# Patient Record
Sex: Male | Born: 1940 | Race: Black or African American | Hispanic: No | Marital: Married | Smoking: Former smoker
Health system: Southern US, Community
[De-identification: ages and names within clinical notes are randomized; demographics above are authoritative.]

## PROBLEM LIST (undated history)

## (undated) ENCOUNTER — Inpatient Hospital Stay: Payer: Self-pay | Admitting: Oncology

## (undated) DIAGNOSIS — E119 Type 2 diabetes mellitus without complications: Secondary | ICD-10-CM

## (undated) DIAGNOSIS — E860 Dehydration: Principal | ICD-10-CM

## (undated) DIAGNOSIS — J029 Acute pharyngitis, unspecified: Secondary | ICD-10-CM

## (undated) DIAGNOSIS — C9002 Multiple myeloma in relapse: Secondary | ICD-10-CM

## (undated) DIAGNOSIS — I10 Essential (primary) hypertension: Secondary | ICD-10-CM

## (undated) DIAGNOSIS — N342 Other urethritis: Secondary | ICD-10-CM

## (undated) DIAGNOSIS — R410 Disorientation, unspecified: Principal | ICD-10-CM

## (undated) DIAGNOSIS — R112 Nausea with vomiting, unspecified: Secondary | ICD-10-CM

## (undated) DIAGNOSIS — N400 Enlarged prostate without lower urinary tract symptoms: Secondary | ICD-10-CM

## (undated) DIAGNOSIS — E1140 Type 2 diabetes mellitus with diabetic neuropathy, unspecified: Secondary | ICD-10-CM

## (undated) DIAGNOSIS — N189 Chronic kidney disease, unspecified: Secondary | ICD-10-CM

## (undated) DIAGNOSIS — K219 Gastro-esophageal reflux disease without esophagitis: Secondary | ICD-10-CM

## (undated) DIAGNOSIS — M47816 Spondylosis without myelopathy or radiculopathy, lumbar region: Secondary | ICD-10-CM

## (undated) DIAGNOSIS — E782 Mixed hyperlipidemia: Secondary | ICD-10-CM

## (undated) DIAGNOSIS — I82409 Acute embolism and thrombosis of unspecified deep veins of unspecified lower extremity: Secondary | ICD-10-CM

## (undated) DIAGNOSIS — R300 Dysuria: Secondary | ICD-10-CM

## (undated) DIAGNOSIS — R509 Fever, unspecified: Secondary | ICD-10-CM

## (undated) DIAGNOSIS — B029 Zoster without complications: Secondary | ICD-10-CM

## (undated) DIAGNOSIS — M1990 Unspecified osteoarthritis, unspecified site: Secondary | ICD-10-CM

## (undated) DIAGNOSIS — D649 Anemia, unspecified: Secondary | ICD-10-CM

## (undated) DIAGNOSIS — IMO0002 Reserved for concepts with insufficient information to code with codable children: Secondary | ICD-10-CM

## (undated) HISTORY — DX: Zoster without complications: B02.9

## (undated) HISTORY — DX: Chronic kidney disease, unspecified: N18.9

## (undated) HISTORY — DX: Type 2 diabetes mellitus with diabetic neuropathy, unspecified: E11.40

## (undated) HISTORY — DX: Reserved for concepts with insufficient information to code with codable children: IMO0002

## (undated) HISTORY — DX: Nausea with vomiting, unspecified: R11.2

## (undated) HISTORY — PX: FOOT SURGERY: SHX648

## (undated) HISTORY — DX: Essential (primary) hypertension: I10

## (undated) HISTORY — DX: Dysuria: R30.0

## (undated) HISTORY — DX: Multiple myeloma in relapse: C90.02

## (undated) HISTORY — DX: Benign prostatic hyperplasia without lower urinary tract symptoms: N40.0

## (undated) HISTORY — DX: Acute pharyngitis, unspecified: J02.9

## (undated) HISTORY — DX: Disorientation, unspecified: R41.0

## (undated) HISTORY — DX: Anemia, unspecified: D64.9

## (undated) HISTORY — DX: Type 2 diabetes mellitus without complications: E11.9

## (undated) HISTORY — DX: Acute embolism and thrombosis of unspecified deep veins of unspecified lower extremity: I82.409

## (undated) HISTORY — DX: Spondylosis without myelopathy or radiculopathy, lumbar region: M47.816

## (undated) HISTORY — DX: Mixed hyperlipidemia: E78.2

## (undated) HISTORY — DX: Fever, unspecified: R50.9

## (undated) HISTORY — DX: Unspecified osteoarthritis, unspecified site: M19.90

## (undated) HISTORY — DX: Dehydration: E86.0

## (undated) HISTORY — DX: Other urethritis: N34.2

## (undated) HISTORY — DX: Gastro-esophageal reflux disease without esophagitis: K21.9

---

## 2009-04-10 ENCOUNTER — Encounter: Admission: RE | Admit: 2009-04-10 | Discharge: 2009-04-10 | Payer: Self-pay | Admitting: Gastroenterology

## 2009-06-02 ENCOUNTER — Emergency Department (HOSPITAL_COMMUNITY): Admission: EM | Admit: 2009-06-02 | Discharge: 2009-06-02 | Payer: Self-pay | Admitting: Emergency Medicine

## 2009-06-04 ENCOUNTER — Inpatient Hospital Stay (HOSPITAL_COMMUNITY): Admission: EM | Admit: 2009-06-04 | Discharge: 2009-06-10 | Payer: Self-pay | Admitting: Emergency Medicine

## 2009-06-04 ENCOUNTER — Ambulatory Visit: Payer: Self-pay | Admitting: Oncology

## 2009-06-07 ENCOUNTER — Encounter (INDEPENDENT_AMBULATORY_CARE_PROVIDER_SITE_OTHER): Payer: Self-pay | Admitting: Interventional Radiology

## 2009-06-11 ENCOUNTER — Ambulatory Visit: Payer: Self-pay | Admitting: Oncology

## 2009-06-11 ENCOUNTER — Emergency Department (HOSPITAL_COMMUNITY): Admission: EM | Admit: 2009-06-11 | Discharge: 2009-06-11 | Payer: Self-pay | Admitting: Emergency Medicine

## 2009-06-13 ENCOUNTER — Ambulatory Visit: Payer: Self-pay | Admitting: Oncology

## 2009-06-13 LAB — CBC WITH DIFFERENTIAL (CANCER CENTER ONLY)
BASO#: 0 10*3/uL (ref 0.0–0.2)
EOS%: 1.8 % (ref 0.0–7.0)
HCT: 25.6 % — ABNORMAL LOW (ref 38.7–49.9)
HGB: 9 g/dL — ABNORMAL LOW (ref 13.0–17.1)
LYMPH%: 26.7 % (ref 14.0–48.0)
MCH: 31.4 pg (ref 28.0–33.4)
MCHC: 35 g/dL (ref 32.0–35.9)
MONO%: 4.9 % (ref 0.0–13.0)
NEUT%: 66.2 % (ref 40.0–80.0)
RDW: 15.2 % — ABNORMAL HIGH (ref 10.5–14.6)

## 2009-06-13 LAB — CMP (CANCER CENTER ONLY)
ALT(SGPT): 25 U/L (ref 10–47)
AST: 21 U/L (ref 11–38)
Albumin: 3 g/dL — ABNORMAL LOW (ref 3.3–5.5)
BUN, Bld: 49 mg/dL — ABNORMAL HIGH (ref 7–22)
CO2: 29 mEq/L (ref 18–33)
Calcium: 10.5 mg/dL — ABNORMAL HIGH (ref 8.0–10.3)
Chloride: 108 mEq/L (ref 98–108)
Potassium: 4.8 mEq/L — ABNORMAL HIGH (ref 3.3–4.7)

## 2009-06-15 LAB — LACTATE DEHYDROGENASE: LDH: 170 U/L (ref 94–250)

## 2009-06-15 LAB — KAPPA/LAMBDA LIGHT CHAINS: Lambda Free Lght Chn: 3550 mg/dL — ABNORMAL HIGH (ref 0.57–2.63)

## 2009-06-26 LAB — CBC WITH DIFFERENTIAL/PLATELET
BASO%: 0 % (ref 0.0–2.0)
Basophils Absolute: 0 10*3/uL (ref 0.0–0.1)
EOS%: 0 % (ref 0.0–7.0)
HCT: 26 % — ABNORMAL LOW (ref 38.4–49.9)
HGB: 8.8 g/dL — ABNORMAL LOW (ref 13.0–17.1)
LYMPH%: 12.5 % — ABNORMAL LOW (ref 14.0–49.0)
MCH: 30.6 pg (ref 27.2–33.4)
MCHC: 33.8 g/dL (ref 32.0–36.0)
MCV: 90.3 fL (ref 79.3–98.0)
NEUT%: 80.2 % — ABNORMAL HIGH (ref 39.0–75.0)
Platelets: 141 10*3/uL (ref 140–400)

## 2009-06-29 LAB — BASIC METABOLIC PANEL
BUN: 42 mg/dL — ABNORMAL HIGH (ref 6–23)
Calcium: 8.4 mg/dL (ref 8.4–10.5)
Chloride: 97 mEq/L (ref 96–112)
Creatinine, Ser: 1.61 mg/dL — ABNORMAL HIGH (ref 0.40–1.50)

## 2009-07-03 LAB — CBC WITH DIFFERENTIAL/PLATELET
BASO%: 0.2 % (ref 0.0–2.0)
Basophils Absolute: 0 10*3/uL (ref 0.0–0.1)
EOS%: 0.2 % (ref 0.0–7.0)
HGB: 8.7 g/dL — ABNORMAL LOW (ref 13.0–17.1)
MCH: 30.9 pg (ref 27.2–33.4)
MCHC: 34.1 g/dL (ref 32.0–36.0)
RBC: 2.82 10*6/uL — ABNORMAL LOW (ref 4.20–5.82)
RDW: 16.4 % — ABNORMAL HIGH (ref 11.0–14.6)
lymph#: 0.6 10*3/uL — ABNORMAL LOW (ref 0.9–3.3)
nRBC: 1 % — ABNORMAL HIGH (ref 0–0)

## 2009-07-03 LAB — BASIC METABOLIC PANEL
Potassium: 4.1 mEq/L (ref 3.5–5.3)
Sodium: 136 mEq/L (ref 135–145)

## 2009-07-06 LAB — COMPREHENSIVE METABOLIC PANEL
CO2: 24 mEq/L (ref 19–32)
Calcium: 6.7 mg/dL — ABNORMAL LOW (ref 8.4–10.5)
Chloride: 107 mEq/L (ref 96–112)
Creatinine, Ser: 1.33 mg/dL (ref 0.40–1.50)
Glucose, Bld: 141 mg/dL — ABNORMAL HIGH (ref 70–99)
Total Bilirubin: 0.5 mg/dL (ref 0.3–1.2)
Total Protein: 4.5 g/dL — ABNORMAL LOW (ref 6.0–8.3)

## 2009-07-06 LAB — CBC WITH DIFFERENTIAL/PLATELET
Eosinophils Absolute: 0.1 10*3/uL (ref 0.0–0.5)
HCT: 24.3 % — ABNORMAL LOW (ref 38.4–49.9)
HGB: 8.4 g/dL — ABNORMAL LOW (ref 13.0–17.1)
LYMPH%: 9.2 % — ABNORMAL LOW (ref 14.0–49.0)
MONO#: 0.1 10*3/uL (ref 0.1–0.9)
NEUT#: 2.7 10*3/uL (ref 1.5–6.5)
NEUT%: 83.5 % — ABNORMAL HIGH (ref 39.0–75.0)
Platelets: 34 10*3/uL — ABNORMAL LOW (ref 140–400)
WBC: 3.2 10*3/uL — ABNORMAL LOW (ref 4.0–10.3)
lymph#: 0.3 10*3/uL — ABNORMAL LOW (ref 0.9–3.3)

## 2009-07-06 LAB — LACTATE DEHYDROGENASE: LDH: 186 U/L (ref 94–250)

## 2009-07-12 ENCOUNTER — Ambulatory Visit: Payer: Self-pay | Admitting: Oncology

## 2009-07-13 LAB — CBC WITH DIFFERENTIAL/PLATELET
BASO%: 0 % (ref 0.0–2.0)
EOS%: 0 % (ref 0.0–7.0)
HCT: 25.8 % — ABNORMAL LOW (ref 38.4–49.9)
HGB: 8.5 g/dL — ABNORMAL LOW (ref 13.0–17.1)
MCH: 30.6 pg (ref 27.2–33.4)
MCHC: 32.9 g/dL (ref 32.0–36.0)
MONO#: 0.1 10*3/uL (ref 0.1–0.9)
NEUT%: 87.9 % — ABNORMAL HIGH (ref 39.0–75.0)
RDW: 15.7 % — ABNORMAL HIGH (ref 11.0–14.6)
WBC: 2.6 10*3/uL — ABNORMAL LOW (ref 4.0–10.3)
lymph#: 0.2 10*3/uL — ABNORMAL LOW (ref 0.9–3.3)

## 2009-07-16 LAB — CBC WITH DIFFERENTIAL/PLATELET
BASO%: 0.4 % (ref 0.0–2.0)
Eosinophils Absolute: 0 10*3/uL (ref 0.0–0.5)
LYMPH%: 25.8 % (ref 14.0–49.0)
MCHC: 32.6 g/dL (ref 32.0–36.0)
MONO#: 0.3 10*3/uL (ref 0.1–0.9)
NEUT#: 1.7 10*3/uL (ref 1.5–6.5)
Platelets: 134 10*3/uL — ABNORMAL LOW (ref 140–400)
RBC: 2.98 10*6/uL — ABNORMAL LOW (ref 4.20–5.82)
RDW: 16.1 % — ABNORMAL HIGH (ref 11.0–14.6)
WBC: 2.6 10*3/uL — ABNORMAL LOW (ref 4.0–10.3)
lymph#: 0.7 10*3/uL — ABNORMAL LOW (ref 0.9–3.3)

## 2009-07-20 LAB — CBC WITH DIFFERENTIAL/PLATELET
BASO%: 0.3 % (ref 0.0–2.0)
Eosinophils Absolute: 0 10*3/uL (ref 0.0–0.5)
HCT: 28.2 % — ABNORMAL LOW (ref 38.4–49.9)
HGB: 9.3 g/dL — ABNORMAL LOW (ref 13.0–17.1)
LYMPH%: 16.1 % (ref 14.0–49.0)
MCHC: 33 g/dL (ref 32.0–36.0)
MONO#: 0.1 10*3/uL (ref 0.1–0.9)
NEUT#: 2.4 10*3/uL (ref 1.5–6.5)
NEUT%: 79 % — ABNORMAL HIGH (ref 39.0–75.0)
Platelets: 117 10*3/uL — ABNORMAL LOW (ref 140–400)
WBC: 3.1 10*3/uL — ABNORMAL LOW (ref 4.0–10.3)
lymph#: 0.5 10*3/uL — ABNORMAL LOW (ref 0.9–3.3)

## 2009-07-23 LAB — CBC WITH DIFFERENTIAL/PLATELET
BASO%: 0.3 % (ref 0.0–2.0)
Basophils Absolute: 0 10e3/uL (ref 0.0–0.1)
EOS%: 0 % (ref 0.0–7.0)
Eosinophils Absolute: 0 10e3/uL (ref 0.0–0.5)
HCT: 31.2 % — ABNORMAL LOW (ref 38.4–49.9)
HGB: 10.1 g/dL — ABNORMAL LOW (ref 13.0–17.1)
LYMPH%: 19.8 % (ref 14.0–49.0)
MCH: 30.1 pg (ref 27.2–33.4)
MCHC: 32.4 g/dL (ref 32.0–36.0)
MCV: 92.9 fL (ref 79.3–98.0)
MONO#: 0.5 10e3/uL (ref 0.1–0.9)
MONO%: 15.3 % — ABNORMAL HIGH (ref 0.0–14.0)
NEUT#: 2.2 10e3/uL (ref 1.5–6.5)
NEUT%: 64.6 % (ref 39.0–75.0)
Platelets: 87 10e3/uL — ABNORMAL LOW (ref 140–400)
RBC: 3.36 10e6/uL — ABNORMAL LOW (ref 4.20–5.82)
RDW: 15.4 % — ABNORMAL HIGH (ref 11.0–14.6)
WBC: 3.3 10e3/uL — ABNORMAL LOW (ref 4.0–10.3)
lymph#: 0.7 10e3/uL — ABNORMAL LOW (ref 0.9–3.3)
nRBC: 1 % — ABNORMAL HIGH (ref 0–0)

## 2009-07-27 LAB — BASIC METABOLIC PANEL
BUN: 22 mg/dL (ref 6–23)
Chloride: 104 mEq/L (ref 96–112)
Potassium: 4.5 mEq/L (ref 3.5–5.3)
Sodium: 137 mEq/L (ref 135–145)

## 2009-07-27 LAB — CBC WITH DIFFERENTIAL/PLATELET
BASO%: 0.2 % (ref 0.0–2.0)
Eosinophils Absolute: 0 10*3/uL (ref 0.0–0.5)
LYMPH%: 6.5 % — ABNORMAL LOW (ref 14.0–49.0)
MCHC: 33.3 g/dL (ref 32.0–36.0)
MONO#: 0.2 10*3/uL (ref 0.1–0.9)
NEUT#: 4.5 10*3/uL (ref 1.5–6.5)
RBC: 3.44 10*6/uL — ABNORMAL LOW (ref 4.20–5.82)
RDW: 14.8 % — ABNORMAL HIGH (ref 11.0–14.6)
WBC: 5 10*3/uL (ref 4.0–10.3)
lymph#: 0.3 10*3/uL — ABNORMAL LOW (ref 0.9–3.3)
nRBC: 1 % — ABNORMAL HIGH (ref 0–0)

## 2009-07-27 LAB — TECHNOLOGIST REVIEW

## 2009-08-03 LAB — CBC WITH DIFFERENTIAL/PLATELET
BASO%: 0 % (ref 0.0–2.0)
Eosinophils Absolute: 0 10*3/uL (ref 0.0–0.5)
MONO#: 0.1 10*3/uL (ref 0.1–0.9)
MONO%: 2.4 % (ref 0.0–14.0)
NEUT#: 4.2 10*3/uL (ref 1.5–6.5)
RBC: 3.27 10*6/uL — ABNORMAL LOW (ref 4.20–5.82)
RDW: 14.8 % — ABNORMAL HIGH (ref 11.0–14.6)
WBC: 4.7 10*3/uL (ref 4.0–10.3)

## 2009-08-06 LAB — COMPREHENSIVE METABOLIC PANEL
Albumin: 4.2 g/dL (ref 3.5–5.2)
Alkaline Phosphatase: 220 U/L — ABNORMAL HIGH (ref 39–117)
BUN: 33 mg/dL — ABNORMAL HIGH (ref 6–23)
Calcium: 8 mg/dL — ABNORMAL LOW (ref 8.4–10.5)
Chloride: 105 mEq/L (ref 96–112)
Glucose, Bld: 199 mg/dL — ABNORMAL HIGH (ref 70–99)
Potassium: 4.6 mEq/L (ref 3.5–5.3)
Sodium: 140 mEq/L (ref 135–145)
Total Protein: 6.2 g/dL (ref 6.0–8.3)

## 2009-08-06 LAB — KAPPA/LAMBDA LIGHT CHAINS: Kappa free light chain: <0.57 mg/dL (ref 0.33–1.94)

## 2009-08-08 ENCOUNTER — Ambulatory Visit: Payer: Self-pay | Admitting: Oncology

## 2009-08-10 LAB — CBC WITH DIFFERENTIAL/PLATELET
Eosinophils Absolute: 0.1 10*3/uL (ref 0.0–0.5)
LYMPH%: 17.3 % (ref 14.0–49.0)
MONO#: 0.2 10*3/uL (ref 0.1–0.9)
NEUT#: 2.3 10*3/uL (ref 1.5–6.5)
Platelets: 140 10*3/uL (ref 140–400)
RBC: 3.13 10*6/uL — ABNORMAL LOW (ref 4.20–5.82)
WBC: 3.2 10*3/uL — ABNORMAL LOW (ref 4.0–10.3)
lymph#: 0.6 10*3/uL — ABNORMAL LOW (ref 0.9–3.3)

## 2009-08-17 LAB — CBC WITH DIFFERENTIAL/PLATELET
Basophils Absolute: 0 10*3/uL (ref 0.0–0.1)
Eosinophils Absolute: 0.1 10*3/uL (ref 0.0–0.5)
HCT: 31 % — ABNORMAL LOW (ref 38.4–49.9)
HGB: 10.1 g/dL — ABNORMAL LOW (ref 13.0–17.1)
LYMPH%: 19.4 % (ref 14.0–49.0)
MCHC: 32.6 g/dL (ref 32.0–36.0)
MONO#: 0.4 10*3/uL (ref 0.1–0.9)
NEUT#: 2.6 10*3/uL (ref 1.5–6.5)
NEUT%: 66.6 % (ref 39.0–75.0)
Platelets: 114 10*3/uL — ABNORMAL LOW (ref 140–400)
WBC: 3.9 10*3/uL — ABNORMAL LOW (ref 4.0–10.3)
lymph#: 0.8 10*3/uL — ABNORMAL LOW (ref 0.9–3.3)

## 2009-08-24 LAB — CBC WITH DIFFERENTIAL/PLATELET
BASO%: 0.5 % (ref 0.0–2.0)
Basophils Absolute: 0 10*3/uL (ref 0.0–0.1)
EOS%: 2.4 % (ref 0.0–7.0)
HCT: 31.9 % — ABNORMAL LOW (ref 38.4–49.9)
HGB: 10.4 g/dL — ABNORMAL LOW (ref 13.0–17.1)
MCH: 29.3 pg (ref 27.2–33.4)
MCHC: 32.6 g/dL (ref 32.0–36.0)
MCV: 89.9 fL (ref 79.3–98.0)
MONO%: 8.1 % (ref 0.0–14.0)
NEUT%: 72.2 % (ref 39.0–75.0)
lymph#: 0.6 10*3/uL — ABNORMAL LOW (ref 0.9–3.3)

## 2009-08-24 LAB — BASIC METABOLIC PANEL
CO2: 24 mEq/L (ref 19–32)
Calcium: 7.5 mg/dL — ABNORMAL LOW (ref 8.4–10.5)
Chloride: 107 mEq/L (ref 96–112)
Creatinine, Ser: 0.99 mg/dL (ref 0.40–1.50)
Glucose, Bld: 139 mg/dL — ABNORMAL HIGH (ref 70–99)
Sodium: 141 mEq/L (ref 135–145)

## 2009-09-07 ENCOUNTER — Encounter: Payer: Self-pay | Admitting: Oncology

## 2009-09-07 ENCOUNTER — Ambulatory Visit: Payer: Self-pay | Admitting: Vascular Surgery

## 2009-09-07 ENCOUNTER — Ambulatory Visit: Admission: RE | Admit: 2009-09-07 | Discharge: 2009-09-07 | Payer: Self-pay | Admitting: Oncology

## 2009-09-07 ENCOUNTER — Ambulatory Visit: Payer: Self-pay | Admitting: Oncology

## 2009-09-07 LAB — CBC WITH DIFFERENTIAL/PLATELET
BASO%: 0.5 % (ref 0.0–2.0)
EOS%: 7.5 % — ABNORMAL HIGH (ref 0.0–7.0)
MCH: 29.1 pg (ref 27.2–33.4)
MCHC: 32.9 g/dL (ref 32.0–36.0)
MCV: 88.6 fL (ref 79.3–98.0)
MONO%: 9.9 % (ref 0.0–14.0)
RBC: 3.5 10*6/uL — ABNORMAL LOW (ref 4.20–5.82)
RDW: 13.9 % (ref 11.0–14.6)
lymph#: 0.6 10*3/uL — ABNORMAL LOW (ref 0.9–3.3)

## 2009-09-14 LAB — CBC WITH DIFFERENTIAL/PLATELET
Eosinophils Absolute: 0.4 10*3/uL (ref 0.0–0.5)
HGB: 11.3 g/dL — ABNORMAL LOW (ref 13.0–17.1)
MCV: 89.4 fL (ref 79.3–98.0)
MONO#: 0.4 10*3/uL (ref 0.1–0.9)
MONO%: 9.9 % (ref 0.0–14.0)
NEUT#: 2.8 10*3/uL (ref 1.5–6.5)
RBC: 3.68 10*6/uL — ABNORMAL LOW (ref 4.20–5.82)
RDW: 14.6 % (ref 11.0–14.6)
WBC: 4 10*3/uL (ref 4.0–10.3)
lymph#: 0.4 10*3/uL — ABNORMAL LOW (ref 0.9–3.3)

## 2009-09-14 LAB — CK: Total CK: 38 U/L (ref 7–232)

## 2009-09-21 LAB — CBC WITH DIFFERENTIAL/PLATELET
Basophils Absolute: 0.1 10*3/uL (ref 0.0–0.1)
Eosinophils Absolute: 0.3 10*3/uL (ref 0.0–0.5)
HGB: 12 g/dL — ABNORMAL LOW (ref 13.0–17.1)
MCV: 87.9 fL (ref 79.3–98.0)
MONO#: 0.7 10*3/uL (ref 0.1–0.9)
MONO%: 11.3 % (ref 0.0–14.0)
NEUT#: 4.5 10*3/uL (ref 1.5–6.5)
RBC: 4.12 10*6/uL — ABNORMAL LOW (ref 4.20–5.82)
RDW: 13.6 % (ref 11.0–14.6)
WBC: 6.2 10*3/uL (ref 4.0–10.3)

## 2009-09-21 LAB — BASIC METABOLIC PANEL
BUN: 13 mg/dL (ref 6–23)
CO2: 23 mEq/L (ref 19–32)
Chloride: 108 mEq/L (ref 96–112)
Glucose, Bld: 133 mg/dL — ABNORMAL HIGH (ref 70–99)
Potassium: 4.2 mEq/L (ref 3.5–5.3)
Sodium: 141 mEq/L (ref 135–145)

## 2009-10-05 LAB — COMPREHENSIVE METABOLIC PANEL
Albumin: 3.9 g/dL (ref 3.5–5.2)
CO2: 29 mEq/L (ref 19–32)
Calcium: 8.3 mg/dL — ABNORMAL LOW (ref 8.4–10.5)
Glucose, Bld: 159 mg/dL — ABNORMAL HIGH (ref 70–99)
Potassium: 4 mEq/L (ref 3.5–5.3)
Sodium: 137 mEq/L (ref 135–145)
Total Protein: 6.5 g/dL (ref 6.0–8.3)

## 2009-10-05 LAB — CBC WITH DIFFERENTIAL/PLATELET
Basophils Absolute: 0 10*3/uL (ref 0.0–0.1)
Eosinophils Absolute: 0.3 10*3/uL (ref 0.0–0.5)
LYMPH%: 5.6 % — ABNORMAL LOW (ref 14.0–49.0)
MCV: 88.2 fL (ref 79.3–98.0)
MONO%: 2.7 % (ref 0.0–14.0)
NEUT#: 5.9 10*3/uL (ref 1.5–6.5)
Platelets: 143 10*3/uL (ref 140–400)
RBC: 4.16 10*6/uL — ABNORMAL LOW (ref 4.20–5.82)

## 2009-10-05 LAB — MORPHOLOGY

## 2009-10-05 LAB — LACTATE DEHYDROGENASE: LDH: 150 U/L (ref 94–250)

## 2009-10-08 ENCOUNTER — Ambulatory Visit: Payer: Self-pay | Admitting: Oncology

## 2009-10-08 LAB — KAPPA/LAMBDA LIGHT CHAINS: Kappa free light chain: 0.75 mg/dL (ref 0.33–1.94)

## 2009-10-10 LAB — UIFE/LIGHT CHAINS/TP QN, 24-HR UR
Alpha 1, Urine: DETECTED — AB
Alpha 2, Urine: DETECTED — AB
Free Kappa Lt Chains,Ur: 6.74 mg/dL — ABNORMAL HIGH (ref 0.04–1.51)
Free Kappa/Lambda Ratio: 5.71 ratio — ABNORMAL HIGH (ref 0.46–4.00)
Total Protein, Urine-Ur/day: 221 mg/d — ABNORMAL HIGH (ref 10–140)
Total Protein, Urine: 8.5 mg/dL

## 2009-10-19 LAB — URINALYSIS, MICROSCOPIC - CHCC
Leukocyte Esterase: NEGATIVE
Nitrite: NEGATIVE
Specific Gravity, Urine: 1.02 (ref 1.003–1.035)
pH: 6 (ref 4.6–8.0)

## 2009-10-19 LAB — BASIC METABOLIC PANEL
BUN: 15 mg/dL (ref 6–23)
Chloride: 105 mEq/L (ref 96–112)
Glucose, Bld: 157 mg/dL — ABNORMAL HIGH (ref 70–99)
Potassium: 3.9 mEq/L (ref 3.5–5.3)

## 2009-10-19 LAB — CBC WITH DIFFERENTIAL/PLATELET
Basophils Absolute: 0.1 10*3/uL (ref 0.0–0.1)
Eosinophils Absolute: 0.3 10*3/uL (ref 0.0–0.5)
HGB: 12.3 g/dL — ABNORMAL LOW (ref 13.0–17.1)
MONO#: 0.4 10*3/uL (ref 0.1–0.9)
NEUT#: 4.5 10*3/uL (ref 1.5–6.5)
RDW: 14.2 % (ref 11.0–14.6)
WBC: 6.1 10*3/uL (ref 4.0–10.3)
lymph#: 0.7 10*3/uL — ABNORMAL LOW (ref 0.9–3.3)

## 2009-10-25 ENCOUNTER — Other Ambulatory Visit: Admission: RE | Admit: 2009-10-25 | Discharge: 2009-10-25 | Payer: Self-pay | Admitting: Oncology

## 2009-10-25 ENCOUNTER — Encounter: Payer: Self-pay | Admitting: Oncology

## 2009-10-31 LAB — CBC WITH DIFFERENTIAL/PLATELET
Basophils Absolute: 0 10*3/uL (ref 0.0–0.1)
EOS%: 6 % (ref 0.0–7.0)
Eosinophils Absolute: 0.4 10*3/uL (ref 0.0–0.5)
HCT: 36.6 % — ABNORMAL LOW (ref 38.4–49.9)
HGB: 12 g/dL — ABNORMAL LOW (ref 13.0–17.1)
MCH: 29.6 pg (ref 27.2–33.4)
MONO#: 0.5 10*3/uL (ref 0.1–0.9)
NEUT#: 4.3 10*3/uL (ref 1.5–6.5)
NEUT%: 72.8 % (ref 39.0–75.0)
lymph#: 0.7 10*3/uL — ABNORMAL LOW (ref 0.9–3.3)

## 2009-10-31 LAB — MORPHOLOGY: PLT EST: DECREASED

## 2009-11-02 LAB — IMMUNOFIXATION ELECTROPHORESIS
IgA: 30 mg/dL — ABNORMAL LOW (ref 68–378)
IgG (Immunoglobin G), Serum: 436 mg/dL — ABNORMAL LOW (ref 694–1618)
IgM, Serum: 64 mg/dL (ref 60–263)

## 2009-11-02 LAB — BETA 2 MICROGLOBULIN, SERUM: Beta-2 Microglobulin: 1.91 mg/L — ABNORMAL HIGH (ref 1.01–1.73)

## 2009-11-02 LAB — COMPREHENSIVE METABOLIC PANEL
ALT: 16 U/L (ref 0–53)
AST: 10 U/L (ref 0–37)
Albumin: 4.1 g/dL (ref 3.5–5.2)
Calcium: 8.3 mg/dL — ABNORMAL LOW (ref 8.4–10.5)
Chloride: 106 mEq/L (ref 96–112)
Potassium: 3.9 mEq/L (ref 3.5–5.3)
Sodium: 141 mEq/L (ref 135–145)
Total Protein: 5.8 g/dL — ABNORMAL LOW (ref 6.0–8.3)

## 2009-11-12 ENCOUNTER — Ambulatory Visit: Payer: Self-pay | Admitting: Oncology

## 2009-11-16 LAB — CBC WITH DIFFERENTIAL/PLATELET
BASO%: 3 % — ABNORMAL HIGH (ref 0.0–2.0)
EOS%: 5.9 % (ref 0.0–7.0)
LYMPH%: 19.1 % (ref 14.0–49.0)
MCH: 29 pg (ref 27.2–33.4)
MCHC: 32.2 g/dL (ref 32.0–36.0)
MONO#: 0.5 10*3/uL (ref 0.1–0.9)
RBC: 3.86 10*6/uL — ABNORMAL LOW (ref 4.20–5.82)
WBC: 4.7 10*3/uL (ref 4.0–10.3)
lymph#: 0.9 10*3/uL (ref 0.9–3.3)
nRBC: 0 % (ref 0–0)

## 2009-11-16 LAB — BASIC METABOLIC PANEL
BUN: 20 mg/dL (ref 6–23)
Chloride: 107 mEq/L (ref 96–112)
Creatinine, Ser: 1.18 mg/dL (ref 0.40–1.50)
Potassium: 4 mEq/L (ref 3.5–5.3)

## 2009-12-04 LAB — CBC WITH DIFFERENTIAL/PLATELET
BASO%: 0.7 % (ref 0.0–2.0)
Eosinophils Absolute: 0.4 10*3/uL (ref 0.0–0.5)
HCT: 35.1 % — ABNORMAL LOW (ref 38.4–49.9)
MCHC: 33.6 g/dL (ref 32.0–36.0)
MONO#: 0.7 10*3/uL (ref 0.1–0.9)
NEUT#: 3.1 10*3/uL (ref 1.5–6.5)
NEUT%: 64.5 % (ref 39.0–75.0)
RBC: 3.83 10*6/uL — ABNORMAL LOW (ref 4.20–5.82)
WBC: 4.9 10*3/uL (ref 4.0–10.3)
lymph#: 0.6 10*3/uL — ABNORMAL LOW (ref 0.9–3.3)

## 2009-12-05 LAB — LACTATE DEHYDROGENASE: LDH: 161 U/L (ref 94–250)

## 2009-12-05 LAB — COMPREHENSIVE METABOLIC PANEL
ALT: 32 U/L (ref 0–53)
CO2: 26 mEq/L (ref 19–32)
Calcium: 8.1 mg/dL — ABNORMAL LOW (ref 8.4–10.5)
Chloride: 104 mEq/L (ref 96–112)
Sodium: 140 mEq/L (ref 135–145)
Total Protein: 5.3 g/dL — ABNORMAL LOW (ref 6.0–8.3)

## 2009-12-11 ENCOUNTER — Ambulatory Visit: Payer: Self-pay | Admitting: Oncology

## 2009-12-27 LAB — COMPREHENSIVE METABOLIC PANEL
Albumin: 4.1 g/dL (ref 3.5–5.2)
BUN: 14 mg/dL (ref 6–23)
Calcium: 9.2 mg/dL (ref 8.4–10.5)
Chloride: 107 mEq/L (ref 96–112)
Glucose, Bld: 157 mg/dL — ABNORMAL HIGH (ref 70–99)
Potassium: 4.1 mEq/L (ref 3.5–5.3)

## 2009-12-27 LAB — CBC WITH DIFFERENTIAL/PLATELET
BASO%: 1 % (ref 0.0–2.0)
Basophils Absolute: 0.1 10*3/uL (ref 0.0–0.1)
EOS%: 1.1 % (ref 0.0–7.0)
HGB: 12.7 g/dL — ABNORMAL LOW (ref 13.0–17.1)
MCH: 31.3 pg (ref 27.2–33.4)
MCHC: 33.7 g/dL (ref 32.0–36.0)
MCV: 92.9 fL (ref 79.3–98.0)
MONO%: 6.8 % (ref 0.0–14.0)
NEUT%: 74.2 % (ref 39.0–75.0)
RDW: 16.5 % — ABNORMAL HIGH (ref 11.0–14.6)

## 2009-12-27 LAB — MORPHOLOGY

## 2010-02-08 ENCOUNTER — Ambulatory Visit: Payer: Self-pay | Admitting: Oncology

## 2010-02-12 LAB — CBC WITH DIFFERENTIAL/PLATELET
BASO%: 0.2 % (ref 0.0–2.0)
HCT: 36.4 % — ABNORMAL LOW (ref 38.4–49.9)
LYMPH%: 14.4 % (ref 14.0–49.0)
MCH: 31.1 pg (ref 27.2–33.4)
MCHC: 33.9 g/dL (ref 32.0–36.0)
MCV: 91.5 fL (ref 79.3–98.0)
MONO#: 0.1 10*3/uL (ref 0.1–0.9)
MONO%: 4.1 % (ref 0.0–14.0)
NEUT%: 77 % — ABNORMAL HIGH (ref 39.0–75.0)
Platelets: 74 10*3/uL — ABNORMAL LOW (ref 140–400)
RBC: 3.98 10*6/uL — ABNORMAL LOW (ref 4.20–5.82)
WBC: 3.3 10*3/uL — ABNORMAL LOW (ref 4.0–10.3)

## 2010-02-12 LAB — MORPHOLOGY: PLT EST: DECREASED

## 2010-02-14 LAB — COMPREHENSIVE METABOLIC PANEL
Alkaline Phosphatase: 116 U/L (ref 39–117)
BUN: 22 mg/dL (ref 6–23)
CO2: 24 mEq/L (ref 19–32)
Chloride: 105 mEq/L (ref 96–112)
Creatinine, Ser: 1.56 mg/dL — ABNORMAL HIGH (ref 0.40–1.50)
Potassium: 4.1 mEq/L (ref 3.5–5.3)
Total Bilirubin: 0.6 mg/dL (ref 0.3–1.2)
Total Protein: 6.2 g/dL (ref 6.0–8.3)

## 2010-02-14 LAB — KAPPA/LAMBDA LIGHT CHAINS
Kappa free light chain: 1.05 mg/dL (ref 0.33–1.94)
Kappa:Lambda Ratio: 0.67 (ref 0.26–1.65)
Lambda Free Lght Chn: 1.57 mg/dL (ref 0.57–2.63)

## 2010-02-14 LAB — IMMUNOFIXATION ELECTROPHORESIS

## 2010-02-20 LAB — UIFE/LIGHT CHAINS/TP QN, 24-HR UR
Beta, Urine: DETECTED — AB
Free Lambda Lt Chains,Ur: 0.34 mg/dL (ref 0.08–1.01)
Free Lt Chn Excr Rate: 76.36 mg/d
Gamma Globulin, Urine: DETECTED — AB
Volume, Urine: 2075 mL

## 2010-03-06 ENCOUNTER — Ambulatory Visit: Payer: Self-pay | Admitting: Oncology

## 2010-03-11 DIAGNOSIS — B029 Zoster without complications: Secondary | ICD-10-CM

## 2010-03-11 HISTORY — DX: Zoster without complications: B02.9

## 2010-04-02 LAB — COMPREHENSIVE METABOLIC PANEL
ALT: 11 U/L (ref 0–53)
AST: 13 U/L (ref 0–37)
Albumin: 4 g/dL (ref 3.5–5.2)
CO2: 27 mEq/L (ref 19–32)
Calcium: 8.9 mg/dL (ref 8.4–10.5)
Chloride: 105 mEq/L (ref 96–112)
Creatinine, Ser: 1.38 mg/dL (ref 0.40–1.50)
Potassium: 4.4 mEq/L (ref 3.5–5.3)
Sodium: 140 mEq/L (ref 135–145)
Total Protein: 5.8 g/dL — ABNORMAL LOW (ref 6.0–8.3)

## 2010-04-02 LAB — CBC WITH DIFFERENTIAL/PLATELET
BASO%: 0.9 % (ref 0.0–2.0)
Basophils Absolute: 0 10*3/uL (ref 0.0–0.1)
EOS%: 0.1 % (ref 0.0–7.0)
HCT: 32.3 % — ABNORMAL LOW (ref 38.4–49.9)
HGB: 11.1 g/dL — ABNORMAL LOW (ref 13.0–17.1)
LYMPH%: 25.1 % (ref 14.0–49.0)
MCH: 31.8 pg (ref 27.2–33.4)
MCHC: 34.3 g/dL (ref 32.0–36.0)
MCV: 92.8 fL (ref 79.3–98.0)
MONO%: 14.3 % — ABNORMAL HIGH (ref 0.0–14.0)
NEUT%: 59.6 % (ref 39.0–75.0)
lymph#: 0.8 10*3/uL — ABNORMAL LOW (ref 0.9–3.3)

## 2010-04-02 LAB — LACTATE DEHYDROGENASE: LDH: 161 U/L (ref 94–250)

## 2010-04-02 LAB — MORPHOLOGY: PLT EST: DECREASED

## 2010-04-05 ENCOUNTER — Ambulatory Visit: Payer: Self-pay | Admitting: Oncology

## 2010-04-09 LAB — MORPHOLOGY: PLT EST: ADEQUATE

## 2010-04-09 LAB — CBC WITH DIFFERENTIAL/PLATELET
EOS%: 2.3 % (ref 0.0–7.0)
Eosinophils Absolute: 0.1 10*3/uL (ref 0.0–0.5)
HGB: 11.5 g/dL — ABNORMAL LOW (ref 13.0–17.1)
MCV: 90.9 fL (ref 79.3–98.0)
MONO%: 13.6 % (ref 0.0–14.0)
NEUT#: 3.2 10*3/uL (ref 1.5–6.5)
RBC: 3.75 10*6/uL — ABNORMAL LOW (ref 4.20–5.82)
RDW: 16.2 % — ABNORMAL HIGH (ref 11.0–14.6)
lymph#: 1.1 10*3/uL (ref 0.9–3.3)
nRBC: 1 % — ABNORMAL HIGH (ref 0–0)

## 2010-04-09 LAB — COMPREHENSIVE METABOLIC PANEL
ALT: 25 U/L (ref 0–53)
Albumin: 4 g/dL (ref 3.5–5.2)
Alkaline Phosphatase: 84 U/L (ref 39–117)
CO2: 33 mEq/L — ABNORMAL HIGH (ref 19–32)
Potassium: 4.6 mEq/L (ref 3.5–5.3)
Sodium: 141 mEq/L (ref 135–145)
Total Bilirubin: 0.4 mg/dL (ref 0.3–1.2)
Total Protein: 6.1 g/dL (ref 6.0–8.3)

## 2010-04-16 LAB — CBC WITH DIFFERENTIAL/PLATELET
Eosinophils Absolute: 0.4 10*3/uL (ref 0.0–0.5)
HCT: 35.2 % — ABNORMAL LOW (ref 38.4–49.9)
LYMPH%: 13.5 % — ABNORMAL LOW (ref 14.0–49.0)
MCV: 93.4 fL (ref 79.3–98.0)
MONO#: 0.5 10*3/uL (ref 0.1–0.9)
MONO%: 11.5 % (ref 0.0–14.0)
NEUT#: 3.1 10*3/uL (ref 1.5–6.5)
NEUT%: 66.3 % (ref 39.0–75.0)
Platelets: 144 10*3/uL (ref 140–400)
RBC: 3.77 10*6/uL — ABNORMAL LOW (ref 4.20–5.82)
WBC: 4.7 10*3/uL (ref 4.0–10.3)

## 2010-04-16 LAB — COMPREHENSIVE METABOLIC PANEL
ALT: 23 U/L (ref 0–53)
AST: 16 U/L (ref 0–37)
Albumin: 3.9 g/dL (ref 3.5–5.2)
Alkaline Phosphatase: 76 U/L (ref 39–117)
Calcium: 9 mg/dL (ref 8.4–10.5)
Chloride: 109 mEq/L (ref 96–112)
Potassium: 4.1 mEq/L (ref 3.5–5.3)
Sodium: 141 mEq/L (ref 135–145)
Total Protein: 6.1 g/dL (ref 6.0–8.3)

## 2010-04-16 LAB — MORPHOLOGY: PLT EST: DECREASED

## 2010-04-23 LAB — CBC WITH DIFFERENTIAL/PLATELET
BASO%: 1 % (ref 0.0–2.0)
Eosinophils Absolute: 0.8 10*3/uL — ABNORMAL HIGH (ref 0.0–0.5)
HCT: 37.3 % — ABNORMAL LOW (ref 38.4–49.9)
LYMPH%: 8.9 % — ABNORMAL LOW (ref 14.0–49.0)
MCHC: 33.8 g/dL (ref 32.0–36.0)
MONO#: 0.4 10*3/uL (ref 0.1–0.9)
NEUT#: 3.9 10*3/uL (ref 1.5–6.5)
NEUT%: 68.8 % (ref 39.0–75.0)
Platelets: 142 10*3/uL (ref 140–400)
RBC: 3.98 10*6/uL — ABNORMAL LOW (ref 4.20–5.82)
WBC: 5.7 10*3/uL (ref 4.0–10.3)
lymph#: 0.5 10*3/uL — ABNORMAL LOW (ref 0.9–3.3)

## 2010-04-23 LAB — COMPREHENSIVE METABOLIC PANEL
AST: 16 U/L (ref 0–37)
Alkaline Phosphatase: 83 U/L (ref 39–117)
BUN: 23 mg/dL (ref 6–23)
Glucose, Bld: 172 mg/dL — ABNORMAL HIGH (ref 70–99)
Total Bilirubin: 0.6 mg/dL (ref 0.3–1.2)

## 2010-04-23 LAB — MORPHOLOGY

## 2010-04-23 LAB — MAGNESIUM: Magnesium: 2.1 mg/dL (ref 1.5–2.5)

## 2010-05-13 ENCOUNTER — Ambulatory Visit: Payer: Self-pay | Admitting: Oncology

## 2010-05-14 LAB — CBC WITH DIFFERENTIAL/PLATELET
EOS%: 27.3 % — ABNORMAL HIGH (ref 0.0–7.0)
HCT: 38.9 % (ref 38.4–49.9)
HGB: 12.9 g/dL — ABNORMAL LOW (ref 13.0–17.1)
LYMPH%: 7.6 % — ABNORMAL LOW (ref 14.0–49.0)
MCH: 31.1 pg (ref 27.2–33.4)
MCHC: 33.2 g/dL (ref 32.0–36.0)
MCV: 93.7 fL (ref 79.3–98.0)
MONO%: 5 % (ref 0.0–14.0)
NEUT#: 2.8 10*3/uL (ref 1.5–6.5)
RBC: 4.15 10*6/uL — ABNORMAL LOW (ref 4.20–5.82)
RDW: 17.2 % — ABNORMAL HIGH (ref 11.0–14.6)
WBC: 4.7 10*3/uL (ref 4.0–10.3)

## 2010-05-16 LAB — IMMUNOFIXATION ELECTROPHORESIS
IgG (Immunoglobin G), Serum: 596 mg/dL — ABNORMAL LOW (ref 694–1618)
IgM, Serum: 53 mg/dL — ABNORMAL LOW (ref 60–263)

## 2010-05-16 LAB — COMPREHENSIVE METABOLIC PANEL
AST: 17 U/L (ref 0–37)
Albumin: 4.5 g/dL (ref 3.5–5.2)
Alkaline Phosphatase: 108 U/L (ref 39–117)
Potassium: 4 mEq/L (ref 3.5–5.3)
Sodium: 140 mEq/L (ref 135–145)
Total Bilirubin: 0.7 mg/dL (ref 0.3–1.2)
Total Protein: 6.3 g/dL (ref 6.0–8.3)

## 2010-05-16 LAB — BETA 2 MICROGLOBULIN, SERUM: Beta-2 Microglobulin: 2.13 mg/L — ABNORMAL HIGH (ref 1.01–1.73)

## 2010-05-16 LAB — KAPPA/LAMBDA LIGHT CHAINS: Lambda Free Lght Chn: <0.42 mg/dL — ABNORMAL LOW (ref 0.57–2.63)

## 2010-06-28 ENCOUNTER — Ambulatory Visit: Payer: Self-pay | Admitting: Oncology

## 2010-07-02 LAB — CBC WITH DIFFERENTIAL/PLATELET
BASO%: 0.8 % (ref 0.0–2.0)
Eosinophils Absolute: 0.4 10*3/uL (ref 0.0–0.5)
LYMPH%: 12.1 % — ABNORMAL LOW (ref 14.0–49.0)
MCHC: 34.5 g/dL (ref 32.0–36.0)
MCV: 92.8 fL (ref 79.3–98.0)
MONO%: 6.7 % (ref 0.0–14.0)
NEUT#: 3 10*3/uL (ref 1.5–6.5)
RBC: 3.94 10*6/uL — ABNORMAL LOW (ref 4.20–5.82)
RDW: 13.4 % (ref 11.0–14.6)
WBC: 4.2 10*3/uL (ref 4.0–10.3)

## 2010-07-03 LAB — MAGNESIUM: Magnesium: 2.3 mg/dL (ref 1.5–2.5)

## 2010-07-03 LAB — COMPREHENSIVE METABOLIC PANEL
ALT: 16 U/L (ref 0–53)
AST: 19 U/L (ref 0–37)
Albumin: 4.4 g/dL (ref 3.5–5.2)
Alkaline Phosphatase: 99 U/L (ref 39–117)
Glucose, Bld: 112 mg/dL — ABNORMAL HIGH (ref 70–99)
Potassium: 4.2 mEq/L (ref 3.5–5.3)
Sodium: 144 mEq/L (ref 135–145)
Total Bilirubin: 0.6 mg/dL (ref 0.3–1.2)
Total Protein: 6.5 g/dL (ref 6.0–8.3)

## 2010-07-03 LAB — KAPPA/LAMBDA LIGHT CHAINS
Kappa:Lambda Ratio: 0.52 (ref 0.26–1.65)
Lambda Free Lght Chn: 1.22 mg/dL (ref 0.57–2.63)

## 2010-07-15 LAB — UIFE/LIGHT CHAINS/TP QN, 24-HR UR
Free Kappa Lt Chains,Ur: 1.46 mg/dL (ref 0.04–1.51)
Free Kappa/Lambda Ratio: 13.27 ratio — ABNORMAL HIGH (ref 0.46–4.00)
Free Lambda Excretion/Day: 2.23 mg/d
Free Lambda Lt Chains,Ur: 0.11 mg/dL (ref 0.08–1.01)
Free Lt Chn Excr Rate: 29.57 mg/d
Time: 24 hours
Total Protein, Urine-Ur/day: 43 mg/d (ref 10–140)
Total Protein, Urine: 2.1 mg/dL
Volume, Urine: 2025 mL

## 2010-07-15 LAB — CREATININE CLEARANCE, URINE, 24 HOUR
Collection Interval-CRCL: 24 hours
Creatinine Clearance: 90 mL/min (ref 75–125)

## 2010-09-03 ENCOUNTER — Ambulatory Visit: Payer: Self-pay | Admitting: Oncology

## 2010-09-03 LAB — CBC WITH DIFFERENTIAL/PLATELET
Basophils Absolute: 0 10*3/uL (ref 0.0–0.1)
Eosinophils Absolute: 0.2 10*3/uL (ref 0.0–0.5)
HCT: 42.2 % (ref 38.4–49.9)
HGB: 13.7 g/dL (ref 13.0–17.1)
LYMPH%: 13.1 % — ABNORMAL LOW (ref 14.0–49.0)
MCHC: 32.5 g/dL (ref 32.0–36.0)
MONO#: 0.3 10*3/uL (ref 0.1–0.9)
NEUT#: 3.4 10*3/uL (ref 1.5–6.5)
NEUT%: 76 % — ABNORMAL HIGH (ref 39.0–75.0)
Platelets: 121 10*3/uL — ABNORMAL LOW (ref 140–400)
WBC: 4.5 10*3/uL (ref 4.0–10.3)

## 2010-09-04 LAB — BASIC METABOLIC PANEL
BUN: 22 mg/dL (ref 6–23)
CO2: 27 mEq/L (ref 19–32)
Calcium: 9.3 mg/dL (ref 8.4–10.5)
Creatinine, Ser: 1.53 mg/dL — ABNORMAL HIGH (ref 0.40–1.50)
Glucose, Bld: 248 mg/dL — ABNORMAL HIGH (ref 70–99)

## 2010-09-24 LAB — SPEP & IFE WITH QIG
Beta Globulin: 6 % (ref 4.7–7.2)
Gamma Globulin: 9.7 % — ABNORMAL LOW (ref 11.1–18.8)
IgA: 29 mg/dL — ABNORMAL LOW (ref 68–378)
IgG (Immunoglobin G), Serum: 637 mg/dL — ABNORMAL LOW (ref 694–1618)
Total Protein, Serum Electrophoresis: 6.3 g/dL (ref 6.0–8.3)

## 2010-09-24 LAB — KAPPA/LAMBDA LIGHT CHAINS

## 2010-10-04 ENCOUNTER — Ambulatory Visit: Payer: Self-pay | Admitting: Oncology

## 2010-10-08 LAB — CBC WITH DIFFERENTIAL/PLATELET
Basophils Absolute: 0 10*3/uL (ref 0.0–0.1)
EOS%: 2.1 % (ref 0.0–7.0)
Eosinophils Absolute: 0.1 10*3/uL (ref 0.0–0.5)
HGB: 13.4 g/dL (ref 13.0–17.1)
MONO#: 0.2 10*3/uL (ref 0.1–0.9)
NEUT#: 3.1 10*3/uL (ref 1.5–6.5)
RDW: 14.1 % (ref 11.0–14.6)
WBC: 4.3 10*3/uL (ref 4.0–10.3)
lymph#: 0.8 10*3/uL — ABNORMAL LOW (ref 0.9–3.3)

## 2010-10-10 LAB — COMPREHENSIVE METABOLIC PANEL
AST: 17 U/L (ref 0–37)
Albumin: 4.3 g/dL (ref 3.5–5.2)
BUN: 18 mg/dL (ref 6–23)
Calcium: 8.8 mg/dL (ref 8.4–10.5)
Chloride: 108 mEq/L (ref 96–112)
Glucose, Bld: 133 mg/dL — ABNORMAL HIGH (ref 70–99)
Potassium: 4.1 mEq/L (ref 3.5–5.3)

## 2010-10-10 LAB — IMMUNOFIXATION ELECTROPHORESIS: IgA: 32 mg/dL — ABNORMAL LOW (ref 68–378)

## 2010-10-10 LAB — KAPPA/LAMBDA LIGHT CHAINS: Lambda Free Lght Chn: 0.83 mg/dL (ref 0.57–2.63)

## 2010-10-29 LAB — CBC WITH DIFFERENTIAL/PLATELET
BASO%: 0.3 % (ref 0.0–2.0)
EOS%: 2.2 % (ref 0.0–7.0)
HCT: 40.5 % (ref 38.4–49.9)
MCH: 30.5 pg (ref 27.2–33.4)
MCHC: 34.1 g/dL (ref 32.0–36.0)
MCV: 89.5 fL (ref 79.3–98.0)
MONO%: 4.8 % (ref 0.0–14.0)
NEUT%: 79.4 % — ABNORMAL HIGH (ref 39.0–75.0)
RDW: 14.4 % (ref 11.0–14.6)
lymph#: 0.6 10*3/uL — ABNORMAL LOW (ref 0.9–3.3)

## 2010-10-31 LAB — IMMUNOFIXATION ELECTROPHORESIS
IgA: 49 mg/dL — ABNORMAL LOW (ref 68–378)
IgM, Serum: 117 mg/dL (ref 60–263)

## 2010-10-31 LAB — COMPREHENSIVE METABOLIC PANEL
ALT: 14 U/L (ref 0–53)
AST: 15 U/L (ref 0–37)
Alkaline Phosphatase: 165 U/L — ABNORMAL HIGH (ref 39–117)
Calcium: 9 mg/dL (ref 8.4–10.5)
Chloride: 105 mEq/L (ref 96–112)
Creatinine, Ser: 1.49 mg/dL (ref 0.40–1.50)
Total Bilirubin: 0.9 mg/dL (ref 0.3–1.2)

## 2010-10-31 LAB — KAPPA/LAMBDA LIGHT CHAINS
Kappa free light chain: <0.6 mg/dL (ref 0.33–1.94)
Lambda Free Lght Chn: 1.04 mg/dL (ref 0.57–2.63)

## 2010-11-04 ENCOUNTER — Ambulatory Visit: Payer: Self-pay | Admitting: Oncology

## 2010-11-04 LAB — UIFE/LIGHT CHAINS/TP QN, 24-HR UR
Alpha 1, Urine: DETECTED — AB
Alpha 2, Urine: DETECTED — AB
Beta, Urine: DETECTED — AB
Free Kappa Lt Chains,Ur: 17 mg/dL — ABNORMAL HIGH (ref 0.04–1.51)
Free Lt Chn Excr Rate: 263.5 mg/d
Gamma Globulin, Urine: DETECTED — AB

## 2010-12-03 LAB — BASIC METABOLIC PANEL
CO2: 24 mEq/L (ref 19–32)
Calcium: 8.7 mg/dL (ref 8.4–10.5)
Creatinine, Ser: 1.38 mg/dL (ref 0.40–1.50)
Glucose, Bld: 97 mg/dL (ref 70–99)

## 2010-12-27 ENCOUNTER — Ambulatory Visit: Payer: Self-pay | Admitting: Oncology

## 2010-12-31 LAB — BASIC METABOLIC PANEL
BUN: 26 mg/dL — ABNORMAL HIGH (ref 6–23)
CO2: 26 mEq/L (ref 19–32)
Calcium: 9.1 mg/dL (ref 8.4–10.5)
Chloride: 106 mEq/L (ref 96–112)
Creatinine, Ser: 1.52 mg/dL — ABNORMAL HIGH (ref 0.40–1.50)
Glucose, Bld: 154 mg/dL — ABNORMAL HIGH (ref 70–99)
Potassium: 4.5 mEq/L (ref 3.5–5.3)
Sodium: 141 mEq/L (ref 135–145)

## 2011-01-10 ENCOUNTER — Other Ambulatory Visit: Payer: Self-pay | Admitting: Oncology

## 2011-01-10 DIAGNOSIS — C9000 Multiple myeloma not having achieved remission: Secondary | ICD-10-CM

## 2011-01-13 ENCOUNTER — Encounter: Payer: Self-pay | Admitting: Internal Medicine

## 2011-01-28 ENCOUNTER — Encounter (HOSPITAL_BASED_OUTPATIENT_CLINIC_OR_DEPARTMENT_OTHER): Payer: Medicare Other | Admitting: Oncology

## 2011-01-28 ENCOUNTER — Other Ambulatory Visit: Payer: Self-pay | Admitting: Oncology

## 2011-01-28 DIAGNOSIS — C9000 Multiple myeloma not having achieved remission: Secondary | ICD-10-CM

## 2011-01-28 LAB — BASIC METABOLIC PANEL
Glucose, Bld: 224 mg/dL — ABNORMAL HIGH (ref 70–99)
Potassium: 4.4 mEq/L (ref 3.5–5.3)
Sodium: 139 mEq/L (ref 135–145)

## 2011-02-25 ENCOUNTER — Other Ambulatory Visit: Payer: Self-pay | Admitting: Oncology

## 2011-02-25 ENCOUNTER — Encounter (HOSPITAL_BASED_OUTPATIENT_CLINIC_OR_DEPARTMENT_OTHER): Payer: Medicare Other | Admitting: Oncology

## 2011-02-25 DIAGNOSIS — C9000 Multiple myeloma not having achieved remission: Secondary | ICD-10-CM

## 2011-02-25 LAB — BASIC METABOLIC PANEL
BUN: 16 mg/dL (ref 6–23)
CO2: 24 mEq/L (ref 19–32)
Chloride: 104 mEq/L (ref 96–112)
Creatinine, Ser: 1.5 mg/dL (ref 0.40–1.50)
Potassium: 4.1 mEq/L (ref 3.5–5.3)

## 2011-03-05 ENCOUNTER — Ambulatory Visit (HOSPITAL_COMMUNITY)
Admission: RE | Admit: 2011-03-05 | Discharge: 2011-03-05 | Disposition: A | Discharge status: Home / Self Care | Payer: Medicare Other | Source: Ambulatory Visit | Attending: Oncology | Admitting: Oncology

## 2011-03-05 DIAGNOSIS — M79609 Pain in unspecified limb: Secondary | ICD-10-CM | POA: Insufficient documentation

## 2011-03-05 DIAGNOSIS — M25519 Pain in unspecified shoulder: Secondary | ICD-10-CM | POA: Insufficient documentation

## 2011-03-05 DIAGNOSIS — M899 Disorder of bone, unspecified: Secondary | ICD-10-CM | POA: Insufficient documentation

## 2011-03-05 DIAGNOSIS — C9000 Multiple myeloma not having achieved remission: Secondary | ICD-10-CM | POA: Insufficient documentation

## 2011-03-05 DIAGNOSIS — M8448XA Pathological fracture, other site, initial encounter for fracture: Secondary | ICD-10-CM | POA: Insufficient documentation

## 2011-03-05 DIAGNOSIS — M545 Low back pain, unspecified: Secondary | ICD-10-CM | POA: Insufficient documentation

## 2011-03-05 DIAGNOSIS — M47812 Spondylosis without myelopathy or radiculopathy, cervical region: Secondary | ICD-10-CM | POA: Insufficient documentation

## 2011-03-11 ENCOUNTER — Encounter (HOSPITAL_BASED_OUTPATIENT_CLINIC_OR_DEPARTMENT_OTHER): Payer: Medicare Other | Admitting: Oncology

## 2011-03-11 ENCOUNTER — Other Ambulatory Visit: Payer: Self-pay | Admitting: Oncology

## 2011-03-11 DIAGNOSIS — I10 Essential (primary) hypertension: Secondary | ICD-10-CM

## 2011-03-11 DIAGNOSIS — N189 Chronic kidney disease, unspecified: Secondary | ICD-10-CM

## 2011-03-11 DIAGNOSIS — C9000 Multiple myeloma not having achieved remission: Secondary | ICD-10-CM

## 2011-03-11 LAB — CBC WITH DIFFERENTIAL/PLATELET
Basophils Absolute: 0 10*3/uL (ref 0.0–0.1)
Eosinophils Absolute: 0.1 10*3/uL (ref 0.0–0.5)
HGB: 12.6 g/dL — ABNORMAL LOW (ref 13.0–17.1)
MONO#: 0.3 10*3/uL (ref 0.1–0.9)
NEUT#: 3.3 10*3/uL (ref 1.5–6.5)
RBC: 4.08 10*6/uL — ABNORMAL LOW (ref 4.20–5.82)
RDW: 14.3 % (ref 11.0–14.6)
WBC: 4.3 10*3/uL (ref 4.0–10.3)
lymph#: 0.6 10*3/uL — ABNORMAL LOW (ref 0.9–3.3)

## 2011-03-13 LAB — COMPREHENSIVE METABOLIC PANEL
Albumin: 4.5 g/dL (ref 3.5–5.2)
Alkaline Phosphatase: 150 U/L — ABNORMAL HIGH (ref 39–117)
BUN: 16 mg/dL (ref 6–23)
CO2: 26 mEq/L (ref 19–32)
Calcium: 9.4 mg/dL (ref 8.4–10.5)
Chloride: 106 mEq/L (ref 96–112)
Glucose, Bld: 108 mg/dL — ABNORMAL HIGH (ref 70–99)
Potassium: 3.9 mEq/L (ref 3.5–5.3)
Sodium: 142 mEq/L (ref 135–145)
Total Protein: 6.6 g/dL (ref 6.0–8.3)

## 2011-03-13 LAB — LACTATE DEHYDROGENASE: LDH: 159 U/L (ref 94–250)

## 2011-03-13 LAB — IMMUNOFIXATION ELECTROPHORESIS: IgG (Immunoglobin G), Serum: 736 mg/dL (ref 694–1618)

## 2011-03-13 LAB — KAPPA/LAMBDA LIGHT CHAINS: Kappa free light chain: 0.93 mg/dL (ref 0.33–1.94)

## 2011-03-17 ENCOUNTER — Other Ambulatory Visit: Payer: Self-pay | Admitting: Oncology

## 2011-03-19 LAB — UIFE/LIGHT CHAINS/TP QN, 24-HR UR
Beta, Urine: DETECTED — AB
Free Lambda Excretion/Day: 85.95 mg/d
Free Lambda Lt Chains,Ur: 5.73 mg/dL — ABNORMAL HIGH (ref 0.08–1.01)
Free Lt Chn Excr Rate: 192 mg/d
Time: 24 hours
Total Protein, Urine-Ur/day: 300 mg/d — ABNORMAL HIGH (ref 10–140)
Volume, Urine: 1500 mL

## 2011-03-25 ENCOUNTER — Encounter (HOSPITAL_BASED_OUTPATIENT_CLINIC_OR_DEPARTMENT_OTHER): Payer: Medicare Other | Admitting: Oncology

## 2011-03-25 ENCOUNTER — Other Ambulatory Visit: Payer: Self-pay | Admitting: Oncology

## 2011-03-25 DIAGNOSIS — C9000 Multiple myeloma not having achieved remission: Secondary | ICD-10-CM

## 2011-03-25 LAB — BASIC METABOLIC PANEL
CO2: 26 mEq/L (ref 19–32)
Calcium: 8.8 mg/dL (ref 8.4–10.5)
Creatinine, Ser: 1.54 mg/dL — ABNORMAL HIGH (ref 0.40–1.50)
Glucose, Bld: 189 mg/dL — ABNORMAL HIGH (ref 70–99)

## 2011-03-26 ENCOUNTER — Other Ambulatory Visit: Payer: Self-pay | Admitting: Oncology

## 2011-03-26 DIAGNOSIS — C9000 Multiple myeloma not having achieved remission: Secondary | ICD-10-CM

## 2011-03-26 LAB — DIFFERENTIAL
Basophils Absolute: 0 10*3/uL (ref 0.0–0.1)
Lymphocytes Relative: 12 % (ref 12–46)
Neutro Abs: 3.4 10*3/uL (ref 1.7–7.7)

## 2011-03-26 LAB — BONE MARROW EXAM

## 2011-03-26 LAB — CBC
Hemoglobin: 11.8 g/dL — ABNORMAL LOW (ref 13.0–17.0)
Platelets: 98 10*3/uL — ABNORMAL LOW (ref 150–400)
RDW: 16.5 % — ABNORMAL HIGH (ref 11.5–15.5)

## 2011-03-31 ENCOUNTER — Ambulatory Visit (HOSPITAL_COMMUNITY)
Admission: RE | Admit: 2011-03-31 | Discharge: 2011-03-31 | Disposition: A | Discharge status: Home / Self Care | Payer: Medicare Other | Source: Ambulatory Visit | Attending: Oncology | Admitting: Oncology

## 2011-03-31 DIAGNOSIS — C9000 Multiple myeloma not having achieved remission: Secondary | ICD-10-CM

## 2011-03-31 DIAGNOSIS — R0789 Other chest pain: Secondary | ICD-10-CM | POA: Insufficient documentation

## 2011-03-31 LAB — GLUCOSE, CAPILLARY
Glucose-Capillary: 104 mg/dL — ABNORMAL HIGH (ref 70–99)
Glucose-Capillary: 105 mg/dL — ABNORMAL HIGH (ref 70–99)
Glucose-Capillary: 113 mg/dL — ABNORMAL HIGH (ref 70–99)
Glucose-Capillary: 119 mg/dL — ABNORMAL HIGH (ref 70–99)
Glucose-Capillary: 129 mg/dL — ABNORMAL HIGH (ref 70–99)
Glucose-Capillary: 130 mg/dL — ABNORMAL HIGH (ref 70–99)
Glucose-Capillary: 132 mg/dL — ABNORMAL HIGH (ref 70–99)
Glucose-Capillary: 90 mg/dL (ref 70–99)
Glucose-Capillary: 98 mg/dL (ref 70–99)
Glucose-Capillary: 107 mg/dL — ABNORMAL HIGH (ref 70–99)
Glucose-Capillary: 108 mg/dL — ABNORMAL HIGH (ref 70–99)
Glucose-Capillary: 110 mg/dL — ABNORMAL HIGH (ref 70–99)
Glucose-Capillary: 120 mg/dL — ABNORMAL HIGH (ref 70–99)

## 2011-03-31 LAB — URINALYSIS, ROUTINE W REFLEX MICROSCOPIC
Glucose, UA: NEGATIVE mg/dL
Glucose, UA: NEGATIVE mg/dL
Ketones, ur: NEGATIVE mg/dL
Leukocytes, UA: NEGATIVE
Leukocytes, UA: NEGATIVE
pH: 5.5 (ref 5.0–8.0)
pH: 7.5 (ref 5.0–8.0)

## 2011-03-31 LAB — COMPREHENSIVE METABOLIC PANEL
ALT: 16 U/L (ref 0–53)
AST: 21 U/L (ref 0–37)
Albumin: 2.8 g/dL — ABNORMAL LOW (ref 3.5–5.2)
Albumin: 3.6 g/dL (ref 3.5–5.2)
Alkaline Phosphatase: 131 U/L — ABNORMAL HIGH (ref 39–117)
Calcium: 9.2 mg/dL (ref 8.4–10.5)
Chloride: 107 mEq/L (ref 96–112)
Creatinine, Ser: 1.22 mg/dL (ref 0.4–1.5)
GFR calc Af Amer: >60 mL/min (ref >60)
GFR calc non Af Amer: 59 mL/min — ABNORMAL LOW (ref >60)
Potassium: 4.2 mEq/L (ref 3.5–5.1)
Sodium: 144 mEq/L (ref 135–145)
Total Bilirubin: 0.8 mg/dL (ref 0.3–1.2)
Total Protein: 5 g/dL — ABNORMAL LOW (ref 6.0–8.3)
Total Protein: 5.9 g/dL — ABNORMAL LOW (ref 6.0–8.3)

## 2011-03-31 LAB — KAPPA/LAMBDA LIGHT CHAINS
Kappa free light chain: <0.6 mg/dL (ref 0.33–1.94)
Lambda free light chains: 1300 mg/dL — ABNORMAL HIGH (ref 0.57–2.63)

## 2011-03-31 LAB — UIFE/LIGHT CHAINS/TP QN, 24-HR UR
Alpha 1, Urine: DETECTED — AB
Alpha 2, Urine: DETECTED — AB
Free Kappa Lt Chains,Ur: 5.74 mg/dL — ABNORMAL HIGH (ref 0.04–1.51)
Free Kappa/Lambda Ratio: 0 ratio — ABNORMAL LOW (ref 0.46–4.00)
Free Lambda Excretion/Day: 191235 mg/d
Free Lt Chn Excr Rate: 163.59 mg/d
Gamma Globulin, Urine: DETECTED — AB
Time: 24 hours
Volume, Urine: 2850 mL

## 2011-03-31 LAB — CBC
HCT: 27.9 % — ABNORMAL LOW (ref 39.0–52.0)
HCT: 23.6 % — ABNORMAL LOW (ref 39.0–52.0)
HCT: 27 % — ABNORMAL LOW (ref 39.0–52.0)
Hemoglobin: 8.7 g/dL — ABNORMAL LOW (ref 13.0–17.0)
MCHC: 33.8 g/dL (ref 30.0–36.0)
MCHC: 33.7 g/dL (ref 30.0–36.0)
MCV: 94.9 fL (ref 78.0–100.0)
MCV: 94.9 fL (ref 78.0–100.0)
Platelets: 157 10*3/uL (ref 150–400)
Platelets: 119 10*3/uL — ABNORMAL LOW (ref 150–400)
Platelets: 123 10*3/uL — ABNORMAL LOW (ref 150–400)
Platelets: 154 10*3/uL (ref 150–400)
RBC: 2.68 MIL/uL — ABNORMAL LOW (ref 4.22–5.81)
RDW: 16.8 % — ABNORMAL HIGH (ref 11.5–15.5)
RDW: 17.4 % — ABNORMAL HIGH (ref 11.5–15.5)
WBC: 4.2 10*3/uL (ref 4.0–10.5)
WBC: 6 10*3/uL (ref 4.0–10.5)
WBC: 3.5 10*3/uL — ABNORMAL LOW (ref 4.0–10.5)
WBC: 3.9 10*3/uL — ABNORMAL LOW (ref 4.0–10.5)

## 2011-03-31 LAB — DIFFERENTIAL
Eosinophils Relative: 1 % (ref 0–5)
Lymphocytes Relative: 21 % (ref 12–46)
Lymphocytes Relative: 25 % (ref 12–46)
Lymphs Abs: 0.8 10*3/uL (ref 0.7–4.0)
Lymphs Abs: 1.1 10*3/uL (ref 0.7–4.0)
Monocytes Absolute: 0.3 10*3/uL (ref 0.1–1.0)
Monocytes Relative: 8 % (ref 3–12)
Neutrophils Relative %: 71 % (ref 43–77)

## 2011-03-31 LAB — POCT I-STAT, CHEM 8
Calcium, Ion: 1.14 mmol/L (ref 1.12–1.32)
Chloride: 104 mEq/L (ref 96–112)
Glucose, Bld: 109 mg/dL — ABNORMAL HIGH (ref 70–99)
HCT: 27 % — ABNORMAL LOW (ref 39.0–52.0)
Hemoglobin: 9.2 g/dL — ABNORMAL LOW (ref 13.0–17.0)
TCO2: 23 mmol/L (ref 0–100)

## 2011-03-31 LAB — CROSSMATCH

## 2011-03-31 LAB — BASIC METABOLIC PANEL
BUN: 20 mg/dL (ref 6–23)
CO2: 28 mEq/L (ref 19–32)
Chloride: 105 mEq/L (ref 96–112)
Creatinine, Ser: 1.33 mg/dL (ref 0.4–1.5)
Potassium: 4 mEq/L (ref 3.5–5.1)

## 2011-03-31 LAB — IGG, IGA, IGM
IgA: 33 mg/dL — ABNORMAL LOW (ref 68–378)
IgG (Immunoglobin G), Serum: 478 mg/dL — ABNORMAL LOW (ref 694–1618)
IgM, Serum: 33 mg/dL — ABNORMAL LOW (ref 60–263)

## 2011-03-31 LAB — APTT: aPTT: 34 seconds (ref 24–37)

## 2011-03-31 LAB — BONE MARROW EXAM

## 2011-03-31 LAB — PROTEIN ELECTROPH W RFLX QUANT IMMUNOGLOBULINS
Gamma Globulin: 13.6 % (ref 11.1–18.8)
M-Spike, %: 0.39 g/dL
Total Protein ELP: 5.5 g/dL — ABNORMAL LOW (ref 6.0–8.3)

## 2011-03-31 LAB — URINE MICROSCOPIC-ADD ON

## 2011-03-31 LAB — PSA: PSA: 1.21 ng/mL (ref 0.10–4.00)

## 2011-03-31 LAB — URINE CULTURE
Colony Count: NO GROWTH
Culture: NO GROWTH

## 2011-03-31 LAB — PROTIME-INR: INR: 1.3 (ref 0.00–1.49)

## 2011-04-09 ENCOUNTER — Encounter (HOSPITAL_BASED_OUTPATIENT_CLINIC_OR_DEPARTMENT_OTHER): Payer: Medicare Other | Admitting: Oncology

## 2011-04-09 DIAGNOSIS — C9000 Multiple myeloma not having achieved remission: Secondary | ICD-10-CM

## 2011-04-09 DIAGNOSIS — E119 Type 2 diabetes mellitus without complications: Secondary | ICD-10-CM

## 2011-04-09 DIAGNOSIS — I129 Hypertensive chronic kidney disease with stage 1 through stage 4 chronic kidney disease, or unspecified chronic kidney disease: Secondary | ICD-10-CM

## 2011-04-09 DIAGNOSIS — N189 Chronic kidney disease, unspecified: Secondary | ICD-10-CM

## 2011-04-22 ENCOUNTER — Encounter (HOSPITAL_BASED_OUTPATIENT_CLINIC_OR_DEPARTMENT_OTHER): Payer: Medicare Other | Admitting: Oncology

## 2011-04-22 ENCOUNTER — Other Ambulatory Visit: Payer: Self-pay | Admitting: Oncology

## 2011-04-22 DIAGNOSIS — C9000 Multiple myeloma not having achieved remission: Secondary | ICD-10-CM

## 2011-04-22 LAB — BASIC METABOLIC PANEL
Chloride: 105 mEq/L (ref 96–112)
Creatinine, Ser: 1.56 mg/dL — ABNORMAL HIGH (ref 0.40–1.50)
Potassium: 4 mEq/L (ref 3.5–5.3)
Sodium: 138 mEq/L (ref 135–145)

## 2011-04-30 ENCOUNTER — Other Ambulatory Visit: Payer: Self-pay | Admitting: Oncology

## 2011-04-30 ENCOUNTER — Encounter (HOSPITAL_BASED_OUTPATIENT_CLINIC_OR_DEPARTMENT_OTHER): Payer: Medicare Other | Admitting: Oncology

## 2011-04-30 DIAGNOSIS — I10 Essential (primary) hypertension: Secondary | ICD-10-CM

## 2011-04-30 DIAGNOSIS — N189 Chronic kidney disease, unspecified: Secondary | ICD-10-CM

## 2011-04-30 DIAGNOSIS — C9000 Multiple myeloma not having achieved remission: Secondary | ICD-10-CM

## 2011-04-30 LAB — CBC WITH DIFFERENTIAL/PLATELET
Basophils Absolute: 0 10*3/uL (ref 0.0–0.1)
Eosinophils Absolute: 0.2 10*3/uL (ref 0.0–0.5)
HCT: 37.2 % — ABNORMAL LOW (ref 38.4–49.9)
HGB: 12.6 g/dL — ABNORMAL LOW (ref 13.0–17.1)
MCV: 91.4 fL (ref 79.3–98.0)
MONO%: 10.6 % (ref 0.0–14.0)
NEUT#: 2.3 10*3/uL (ref 1.5–6.5)
NEUT%: 69.7 % (ref 39.0–75.0)
RDW: 14.4 % (ref 11.0–14.6)

## 2011-05-01 LAB — COMPREHENSIVE METABOLIC PANEL
BUN: 12 mg/dL (ref 6–23)
CO2: 24 mEq/L (ref 19–32)
Creatinine, Ser: 1.57 mg/dL — ABNORMAL HIGH (ref 0.40–1.50)
Glucose, Bld: 204 mg/dL — ABNORMAL HIGH (ref 70–99)
Total Bilirubin: 0.7 mg/dL (ref 0.3–1.2)

## 2011-05-01 LAB — KAPPA/LAMBDA LIGHT CHAINS
Kappa free light chain: 1.36 mg/dL (ref 0.33–1.94)
Kappa:Lambda Ratio: 0.17 — ABNORMAL LOW (ref 0.26–1.65)
Lambda Free Lght Chn: 7.92 mg/dL — ABNORMAL HIGH (ref 0.57–2.63)

## 2011-05-06 NOTE — H&P (Signed)
NAME:  [name] [name], [name] [name]               ACCOUNT NO.:  [id number]   MEDICAL RECORD NO.:  [id number]          PATIENT TYPE:  INP   LOCATION:  5002                         FACILITY:  [facility]   PHYSICIAN:  Kela Millin, M.D.DATE OF BIRTH:  08/18/1941   DATE OF ADMISSION:  06/04/2009  DATE OF DISCHARGE:                              HISTORY & PHYSICAL   [facility]   CHIEF COMPLAINT:  Back pain and inability to stand.   HISTORY OF PRESENT ILLNESS:  [name] [name] is a 70 year old African-  American male who is seen in the emergency department on June 02, 2009,  secondary to complaints of right-sided flank pain.  CT of the abdomen  and pelvis performed at that time noted lytic lesions.  The patient was  sent home with Percocet and followed up with his primary care today in  the office due to inability to stand or walk.  The patient tells that  this inability to stand or walk is primarily secondary to excruciating  pain.  The wife had EMS transport the patient to the appointment and the  patient was sent to the [facility] ED by his primary physician.  The  wife notes a 15 pound unintentional weight loss over the last several  months.  The patient reports positive urinary incontinence which is new  has been occurring for last several weeks.   ALLERGIES:  No known drug allergies.  No known food allergies.   PAST MEDICAL HISTORY:  1. Hypertension.  2. Diabetes type 2.  3. Hypercholesterolemia.   PAST SURGICAL HISTORY:  1. Left ankle surgery.  2. History of circumcision at age 70.   SOCIAL HISTORY:  The patient is married.  He has a daughter who lives in  [place] and a son who lives in [place].  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  He quit smoking  approximately 15 years ago but wife states he still has an occasional  cigarette.   FAMILY HISTORY:  He has two children who are alive and well.  Mother is  deceased at age 71 from natural  causes.  Dad is deceased at age 71  from CVA.  He has 3 brothers who are decreased, one had throat cancer,  and the other two died of old age.  He has three sisters who are  deceased, one died of a cerebral hemorrhage, one died of MI, and one  died from complications of diabetes.  He has a total of 12 siblings.   MEDICATIONS:  1. Metformin 500 mg p.o. b.i.d.  2. Diovan 80 mg p.o. daily.  3. Lipitor 10 mg p.o. daily.  4. Aciphex 20 mg p.o. daily.  5. Bayer Aspirin 81 mg p.o. daily.   REVIEW OF SYSTEMS:  GENERAL:  He notes occasional chills.  Positive,  unintentional weight loss of 15 pounds over the last 2 to 3 months.  CARDIOVASCULAR:  Denies chest pain.  Denies palpitations.  GI:  Positive  hiatal hernia.  Denies nausea.  Denies vomiting.  Positive constipation,  last bowel movement being  on June 01, 2009.  GU:  He notes positive  urinary incontinence for several weeks, slight dysuria.  Denies  hematuria.  PSYCHIATRIC:  Denies depression.  Denies anxiety.  SKIN:  No  rashes.  No lesions.  LYMPH:  Denies lymphadenopathy.  ENT:  Denies  sinus congestion.  NEUROLOGIC:  Positive lower extremity tremor with  walking.   LABORATORY DATA/RADIOLOGY:  1. CT abdomen and pelvis notes diffuse lytic lesions throughout the      thoracic and lumbar spine which is concerning for metastasis or      myeloma.  An 8-mm adrenal lesion is noted, felt to be adenoma.      There is noted to be extensive lytic lesions within the visualized      axial skeleton compatible with metastatic disease or myeloma.  Left      inguinal fat hernia is noted without bowel.  2. Urinalysis.  Trace blood and positive protein.  Sodium 141,      potassium 3.8, chloride 104, bicarb 23, BUN 16, creatinine 1.3,      glucose 109, hemoglobin 9.2, hematocrit 27, white blood cell count      3.9, platelets 123.   PHYSICAL EXAMINATION:  VITAL SIGNS:  BP 153/80, heart rate 100,  respiratory rate 20, temperature 99, O2 sat 97% on room  air.  GENERAL:  The patient is awake, alert, pale and weak appearance American  male who is in no acute distress.  NECK:  Supple.  No masses.  No lymphadenopathy is noted.  CARDIOVASCULAR:  S1 and S2.  Regular rate and rhythm with bilateral  lower extremity edema.  RESPIRATORY:  Breath sounds are clear to auscultation bilaterally  without wheezes, rales, or rhonchi.  No increased work up breathing is  noted.  ABDOMEN:  Soft, nontender and nondistended.  Positive bowel sounds.  PSYCHIATRIC:  He is alert and oriented x3.  He is calm and pleasant.  NEUROLOGIC:  Positive facial asymmetry is noted.  He is moving all  extremities.  Bilateral lower extremities 4 to 5+/5 strength.  Positive  dorsi and pedal flexion.   ASSESSMENT AND PLAN:  1. Intractable pain/inability to walk/urinary incontinence, rule out      multiple myeloma versus metastatic disease.  CT of the abdomen and      pelvis notes diffuse lytic lesions.  We will check an SPEP with      immunofixation of peripheral smear, CMET, LDH and CRP.  In      addition, we will check his skeletal survey.  MRI performed here in      the ED notes T4 spinous process lesions.  Concerning for multiple      myeloma.  No thoracic spinal cord compressions noted.  There are      noted to be multiple rib lesions.  We will add Percocet and      morphine p.r.n. breakthrough pain.  We have also consulted      Oncology.  I have spoken to Dr. Welton Flakes.  They wish to defer bone      marrow biopsy at this time until further laboratory results are      back including SPEP.  2. Hypertension.  BP is up a bit today likely secondary to pain.      Continue home meds and monitor.  3. Diabetes type 2.  Add sliding scale coverage while inpatient, carb-      modified diet.  4. Gastroesophageal reflux disease.  Continue proton pump inhibitor.  5. Dyslipidemia.  Plan to continue statin.  6. Constipation.  We will add Dulcolax suppository.   DISPOSITION:  Will need  PT/OT eval when pain is controlled.      Sandford Craze, NP      Kela Millin, M.D.  Electronically Signed    MO/MEDQ  D:  06/04/2009  T:  06/05/2009  Job:  [id number]   cc:   Deirdre Peer. Polite, M.D.

## 2011-05-06 NOTE — Consult Note (Signed)
NAME:  [name] [name], [name] [name] NO.:  [id number]   MEDICAL RECORD NO.:  [id number]          [name] TYPE:  INP   LOCATION:  5002                         FACILITY:  [facility]   PHYSICIAN:  Drue Second, MD       DATE OF BIRTH:  1941-10-31   DATE OF CONSULTATION:  06/04/2009  DATE OF DISCHARGE:                                 CONSULTATION   REASON FOR CONSULTATION:  Lytic lesions noted on scans, evaluate for  metastases or multiple myeloma.   HISTORY OF PRESENT ILLNESS:  [name] [name] is a pleasant 70 year old  [name] [name], who presented to [name] emergency room initially on June 02, 2009, due to complaints of right-sided flank pain.  At that time, [name]  [name] had a CT of [name] abdomen and pelvis, which noted lytic lesions.  [name] [name] was sent home with Percocet and was seen by [name] [name] primary care  Madoc Holquin today, June 14 in [name] office to [name] in due to inability to  stand or walk.  [name] [name] was transported via EMS to [name] [facility]  Emergency Room for further evaluation.  [name] [name] is having difficulty  standing or walking today.  [name] [name] also notes a 15-pound unintentional  weight loss over [name] past few months.  [name] [name] is also having difficulty with  urinary incontinence, which is new, but has been occurring for [name] past  several weeks and getting worse.  [name] [name] denies any other  arthralgias or pains to any part [name] body with [name] exception of [name]  right side of [name] [name] back/flank.  [name] [name] denies any fevers, chills, or  night sweats.  No chest pain, shortness of breath, or palpitations.  No  abdominal pain.  No nausea or vomiting.  [name] [name] does report  constipation, but no diarrhea, no bleeding, or easy bruisability.  No  headaches.  No visual disturbances.  No complaints of peripheral  neuropathy.  [name] remainder of review of systems is negative.  [name]  [name] was admitted for control of [name] [name] pain and further evaluation of  [name] [name] lytic lesions.   ALLERGIES:  No known drug  allergies.   CURRENT MEDICATIONS:  1. Metformin 500 mg twice a day.  2. Diovan 80 mg daily.  3. Lipitor 10 mg daily.  4. Aciphex 20 mg daily.  5. Aspirin 81 mg daily.   PAST MEDICAL HISTORY:  1. Hypertension.  2. Diabetes.  3. Hyperlipidemia.  4. Gastroesophageal reflux disease.   PAST SURGICAL HISTORY:  1. Left ankle surgery.  2. History of circumcision at age 8.   SOCIAL HISTORY:  [name] [name] is married.  [name] [name] previously worked at  [employer].  [name] [name] has rare alcohol use and [name] [name] quit smoking  approximately 15 years ago.  [name] [name] has a daughter, who lives in  [place], and a son, who lives in [place].   FAMILY HISTORY:  [name] [name] has 2 children, who are alive and well.  [name] [name]  has a mother, who is deceased at age 38 from natural causes.  [name] [name] father  is deceased age 80 for a CVA.  [name] [name]  also has 3 brothers, who are  deceased, 1 from throat cancer and [name] other 2 died of old age.  [name]  [name] has 3 sisters, who are deceased.  One died from a cerebral  hemorrhage, one from an MI, and [name] other died from complications of  diabetes.  [name] [name] has a total of 12 siblings.  No other reports of  other cancers or blood disorders.   REVIEW OF SYSTEMS:  As noted in HPI.   PHYSICAL EXAMINATION:  VITAL SIGNS:  Temperature is 99.0, pulse 100,  blood pressure is 153/80, respirations are 20, and O2 sat is 97% on room  air.  GENERAL:  [name] [name] is awake and alert.  [name] [name] is a tired-appearing  [name] [name], who is in no acute distress.  HEENT:  Oral mucosa is moist.  Posterior pharynx clear.  LYMPHATICS:  There is no palpable cervical, supraclavicular, axillary,  or inguinal lymphadenopathy.  LUNGS:  Clear to auscultation bilaterally.  No rales, wheezes, or  rhonchi noted.  CARDIOVASCULAR:  Regular rate and rhythm.  No murmurs, gallops, or rubs.  ABDOMEN:  Soft, nontender, and nondistended.  Bowel sounds are present.  No hepatosplenomegaly.  EXTREMITIES:  Trace edema  bilaterally.  No palpable cords.  NEUROLOGIC:  [name] [name] is alert and oriented x3, otherwise nonfocal.  Cranial nerves II through XII are grossly intact.  Strength is  symmetrical in [name] upper and lower extremities.  Sensation is intact.   LABORATORY DATA:  WBC is 3.9, hemoglobin 9.2, hematocrit 27.0, and  platelets are 123,000.  ANC is 2.8.  Sodium is 141, potassium 3.8,  glucose is normal at 109, BUN 16, creatinine 1.3, and ionized calcium is  normal at 1.14.   RADIOLOGIC DATA:  [name] [name] had a CT of [name] abdomen and pelvis  performed on June 02, 2009.  [name] CT of [name] abdomen showed diffuse lytic  lesions throughout [name] thoracic and lumbar spine concerning for  metastases or myeloma.  There is no evidence for nephrolithiasis or  hydronephrosis.  There was an 8-mm lesion of [name] right adrenal gland  compatible with adrenal adenoma.  CT of [name] pelvis shows extensive lytic  lesions within [name] visualized axial skeleton compatible with metastatic  disease or myeloma.  There is sigmoid and descending colonic  diverticulosis without diverticulitis.  There is a fat herniating into  [name] left inguinal canal without associated bowel.  An MRI of [name]  thoracic and lumbar spine from June 14 showed diffusely decreased T1  marrow signal well with only mild heterogeneity of marrow enhancement.  A 2 x 3 cm expansile left posterior 2nd rib lesion with smaller left T4  rib lesion and a focal T4 spinous process lesion favor diffuse multiple  myeloma over other neoplastic marrow infiltrative prostheses.  There is  no thoracic spinal cord abnormality or thoracic spinal stenosis.  There  is mild T4 and T9 loss of vertebral body height without strong evidence  of acute fracture.  There are small pleural effusions.  There is diffuse  lumbar and visualized pelvic marrow replacement and hyperenhancement  favor multiple myeloma.  There is no lumbar spinal stenosis and no  abnormal intradural enhancement.  There  is mild L1 vertebral body loss  of height without strong evidence of acute fracture.   IMPRESSION:  This is a 70 year old [name] [name] with increasing low back  pain and new onset of incontinence.  [name] [name] is noted to have lytic  lesions on [name] [name] recent CT and MRI.  PLAN:  1. [name] [name] needs further evaluation of [name] [name] lytic lesions.  We will      work [name] [name] up for possible multiple myeloma.  [name] [name] will have a      skeletal survey, serum protein electrophoresis with immunofixation,      urine protein electrophoresis with immunofixation, beta-2      microglobulin, LDH, and C-reactive protein have been drawn.      Depending on these results, [name] [name] may need a bone marrow      biopsy.  2. [name] [name] is anemic, likely due to a malignant process.  At this      time, we will discontinue to monitor [name] [name]'s CBC.  We will      transfuse blood products if [name] [name] hemoglobin falls to less than 8 or      if [name] [name] becomes symptomatic   Thank you for this consult.  We will continue to follow [name] [name] with  you while [name] [name] is hospitalized.  Both [name] [name] and wife are aware of  [name] test results and [name] need for additional workup and state  understanding of [name] plan.      Myrtis Ser, NP      Drue Second, MD  Electronically Signed    KRC/MEDQ  D:  06/05/2009  T:  06/06/2009  Job:  [id number]

## 2011-05-06 NOTE — Discharge Summary (Signed)
NAME:  [name] [name], [name] [name] NO.:  [id number]   MEDICAL RECORD NO.:  [id number]          PATIENT TYPE:  INP   LOCATION:  5002                         FACILITY:  [facility]   PHYSICIAN:  Deirdre Peer. Polite, M.D. DATE OF BIRTH:  January 25, 1941   DATE OF ADMISSION:  06/04/2009  DATE OF DISCHARGE:  06/10/2009                               DISCHARGE SUMMARY   DISCHARGE DIAGNOSES:  1. Probable multiple myeloma.  Skeletal series showing lytic lesions.      CT scan showing lytic lesions.  MRI showing lytic lesions.  Bone      marrow biopsy final report pending.  2. Anemia.  No transfusions given during this hospitalization.      Discharge hemoglobin 9.4.  3. Hypertension with relative hypotension.  Bloopd pressure      medications are on hold.  4. Diabetes.  CBGs fairly controlled.  Medications placed on hold as      the patient had poor p.o. intake.  Medications will be resumed on      an outpatient basis as needed.  5. Weight-loss secondary to probable underlying malignancy.  6. Deconditioning seen in consultation by Physical Therapy and      Occupational Therapy.  The patient will have Home Health PT.  7. Back pain secondary to lytic lesions.   DISCHARGE MEDICATIONS:  1. Diovan 80 mg daily will be on hold until notified by MD.  2. Metformin 500 mg b.i.d., currently on hold.  3. Lipitor 10 mg daily.  4. Aciphex 20 mg daily.  5. Aspirin 81 mg daily.  6. Vicodin 5/500 one to two tablets q.6 h. p.r.n.   DISPOSITION:  Being discharged to home in stable condition.  The patient  will have outpatient follow with primary MD in 1 week as well as  Hematology/Oncology, Dr. Cleone Slim.   CONSULTATIONS:  Lynett Fish, MD, Hematology/Oncology.   PROCEDURES:  Lumbar biopsy, final path pending.  MRI of the spine showed  diffuse lumbar and visualized pelvic bone replacement and  hyperenhancement.  Mild L1 vertebral body loss of height without showing  evidence of acute fracture, no thoracic  spinal cord abnormality, mild T4-  T9 loss of vertebral body height without strong evidence of fracture.   HISTORY OF PRESENT ILLNESS:  A 70 year old male presented to primary MD  office via a stretcher with inability to ambulate secondary to back  pain.  The patient was in the process of being evaluated for his weight  loss.  The patient was directed straight to the ER for a stat MRI which  was negative for any compression fracture or worrisome cord compression.  Admission was deemed necessary for further evaluation for probable  myeloma based on lytic lesions noted.  Please see dictated H and P for  further details.   PAST MEDICAL HISTORY:  1. Hypertension.  2. Diabetes.  3. Hypercholesterolemia.   PAST SURGICAL HISTORY:  Per admission H and P.   SOCIAL HISTORY:  Per admission H and P.   MEDICATIONS ON ADMISSION:  Per admission H and P.   HOSPITAL COURSE:  The patient was admitted  to Medicine Floor bed for  evaluation and treatment of back pain.  As stated, MRI was obtained  acutely and there was no sign of cord compression.  There was a strong  suspicion for myeloma.  The patient was seen in consultation by  Hematology who recommended Interventional Radiology perform a bone  marrow biopsy.  SPEP and UPEP were obtained as well as other screening  tests.  Immunofixation study showed positive for monoclonal free lambda  light chains.  Protein electrophoresis showed there were restricted  bands consistent with monoclonal protein present.  The patient's back  pain improved with analgesia.  The patient was not seen by Hematology  over the weekend as we were waiting for the bone marrow biopsy results.  As the patient's pain is poorly controlled, there are no worrisome signs  for cord compression.  The patient is deemed stable for discharge to  home with further outpatient management.      Deirdre Peer. Polite, M.D.  Electronically Signed     RDP/MEDQ  D:  06/10/2009  T:   06/11/2009  Job:  [id number]

## 2011-05-14 ENCOUNTER — Other Ambulatory Visit: Payer: Self-pay | Admitting: Oncology

## 2011-05-14 ENCOUNTER — Ambulatory Visit (HOSPITAL_COMMUNITY)
Admission: RE | Admit: 2011-05-14 | Discharge: 2011-05-14 | Disposition: A | Discharge status: Home / Self Care | Payer: Medicare Other | Source: Ambulatory Visit | Attending: Oncology | Admitting: Oncology

## 2011-05-14 ENCOUNTER — Encounter (HOSPITAL_BASED_OUTPATIENT_CLINIC_OR_DEPARTMENT_OTHER): Payer: Medicare Other | Admitting: Oncology

## 2011-05-14 DIAGNOSIS — M7989 Other specified soft tissue disorders: Secondary | ICD-10-CM | POA: Insufficient documentation

## 2011-05-14 DIAGNOSIS — I10 Essential (primary) hypertension: Secondary | ICD-10-CM

## 2011-05-14 DIAGNOSIS — R609 Edema, unspecified: Secondary | ICD-10-CM

## 2011-05-14 DIAGNOSIS — C9000 Multiple myeloma not having achieved remission: Secondary | ICD-10-CM | POA: Insufficient documentation

## 2011-05-14 DIAGNOSIS — I129 Hypertensive chronic kidney disease with stage 1 through stage 4 chronic kidney disease, or unspecified chronic kidney disease: Secondary | ICD-10-CM

## 2011-05-14 DIAGNOSIS — N189 Chronic kidney disease, unspecified: Secondary | ICD-10-CM

## 2011-05-14 DIAGNOSIS — M79609 Pain in unspecified limb: Secondary | ICD-10-CM

## 2011-05-14 DIAGNOSIS — I82409 Acute embolism and thrombosis of unspecified deep veins of unspecified lower extremity: Secondary | ICD-10-CM | POA: Insufficient documentation

## 2011-05-14 HISTORY — DX: Acute embolism and thrombosis of unspecified deep veins of unspecified lower extremity: I82.409

## 2011-05-14 LAB — COMPREHENSIVE METABOLIC PANEL
AST: 11 U/L (ref 0–37)
Albumin: 4.1 g/dL (ref 3.5–5.2)
Alkaline Phosphatase: 144 U/L — ABNORMAL HIGH (ref 39–117)
Glucose, Bld: 195 mg/dL — ABNORMAL HIGH (ref 70–99)
Potassium: 3.9 mEq/L (ref 3.5–5.3)
Sodium: 140 mEq/L (ref 135–145)
Total Protein: 6.1 g/dL (ref 6.0–8.3)

## 2011-05-14 LAB — CBC WITH DIFFERENTIAL/PLATELET
Eosinophils Absolute: 0.2 10*3/uL (ref 0.0–0.5)
MCV: 91.2 fL (ref 79.3–98.0)
MONO#: 0.2 10*3/uL (ref 0.1–0.9)
MONO%: 6.6 % (ref 0.0–14.0)
NEUT#: 2.1 10*3/uL (ref 1.5–6.5)
RBC: 3.87 10*6/uL — ABNORMAL LOW (ref 4.20–5.82)
RDW: 14.5 % (ref 11.0–14.6)
WBC: 3.2 10*3/uL — ABNORMAL LOW (ref 4.0–10.3)
lymph#: 0.6 10*3/uL — ABNORMAL LOW (ref 0.9–3.3)

## 2011-05-19 IMAGING — CR DG BONE SURVEY MET
9 of 10 series · 9 of 10 positions shown · non-contrast
Comparison: 06/05/2009.

CLINICAL DATA: Multiple myeloma.

METASTATIC BONE SURVEY

[w c-spine lat]
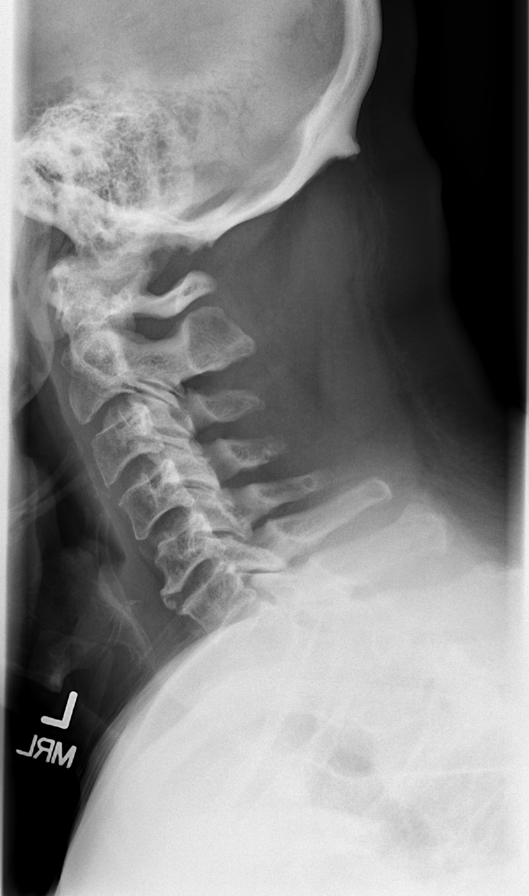

[w skull lat *]
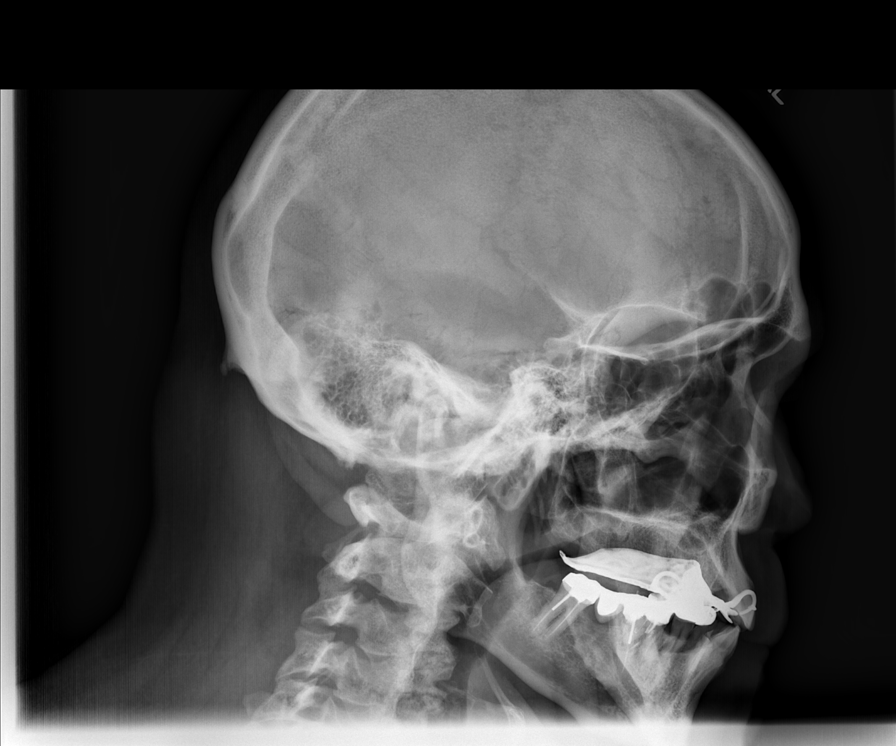

[t c-spine a.p.]
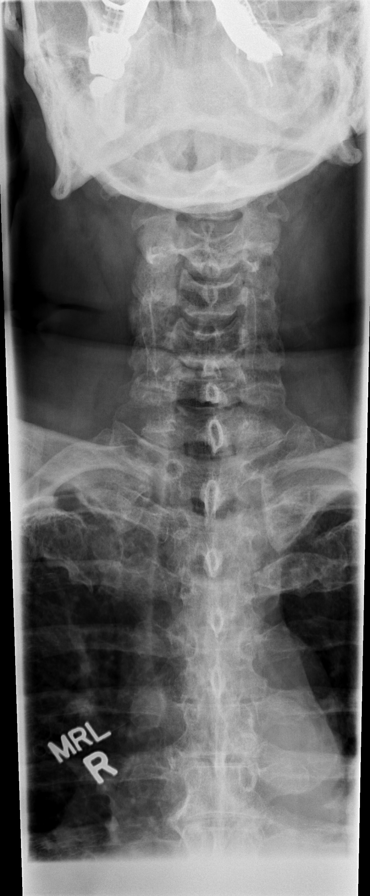

[t shoulder ap external left]
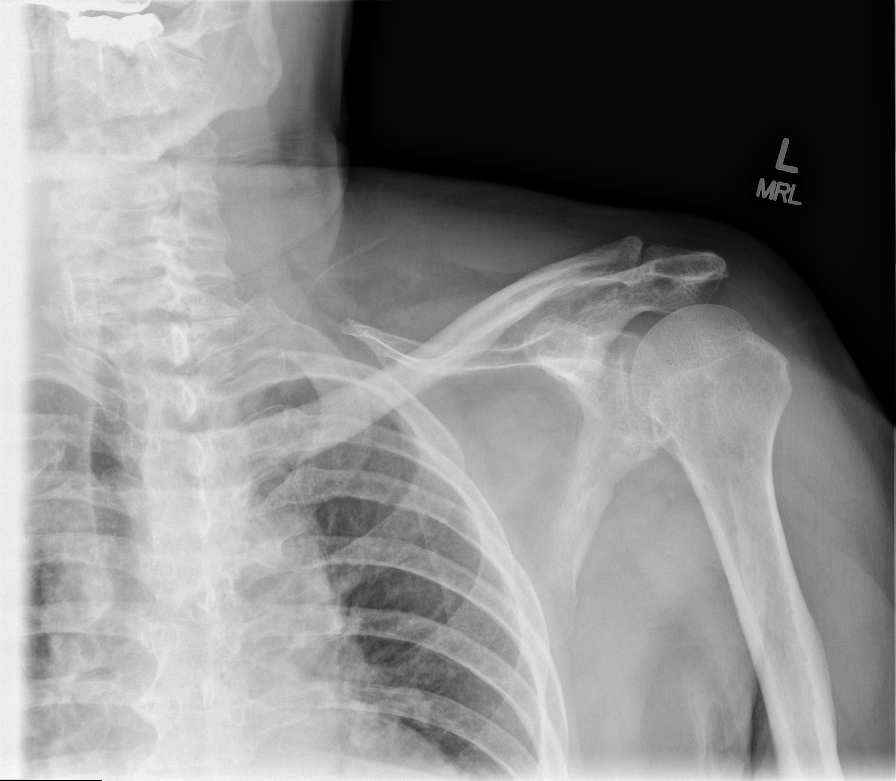

[t humerus ap left]
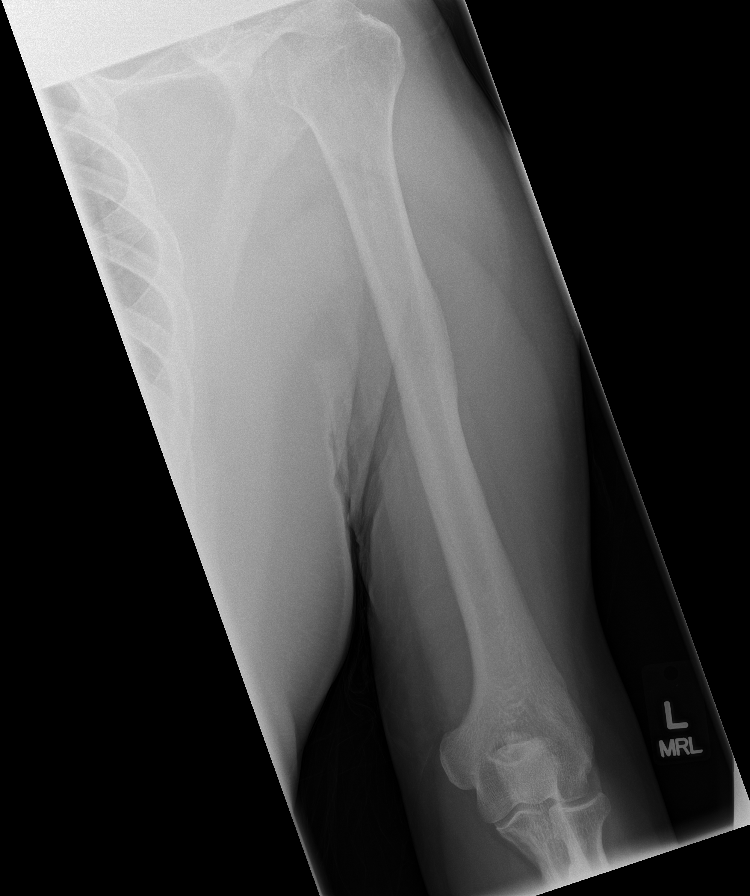

[t shoulder ap external righ]
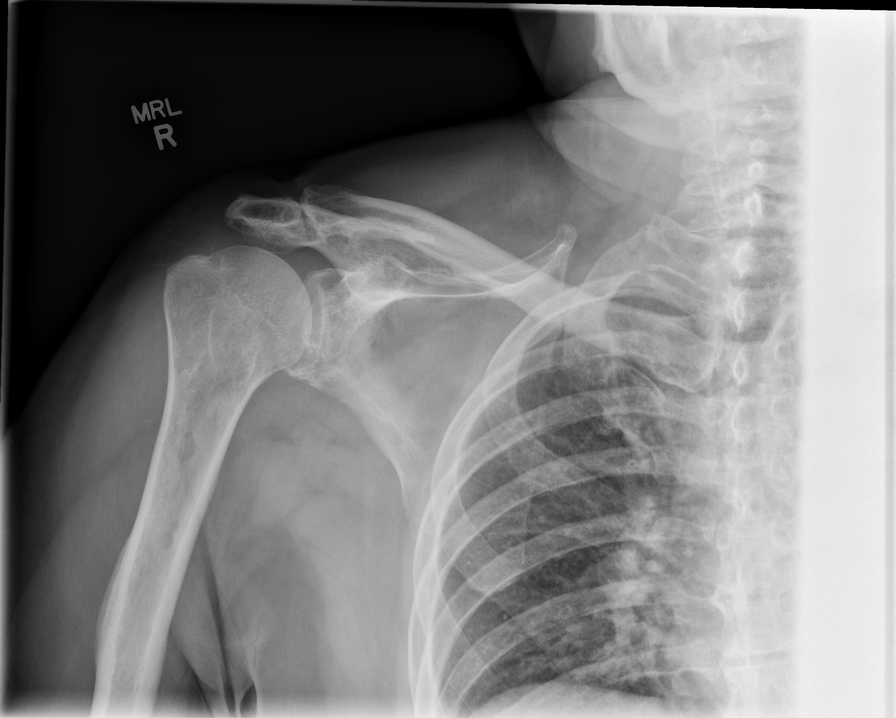

[t humerus ap right]
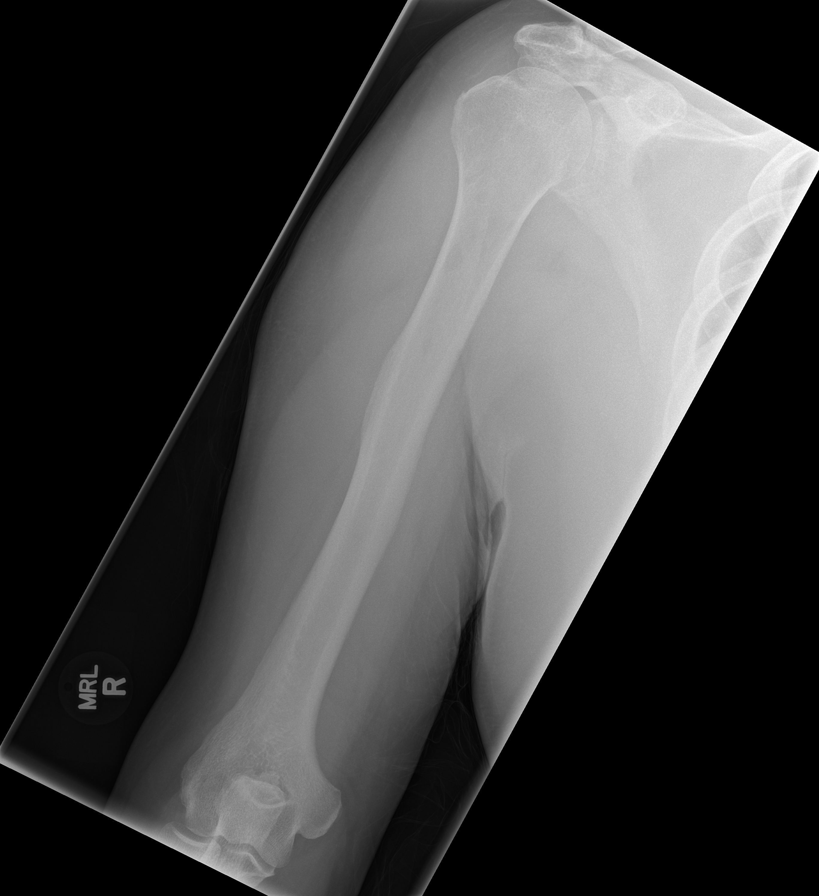

[t t-spine a.p.]
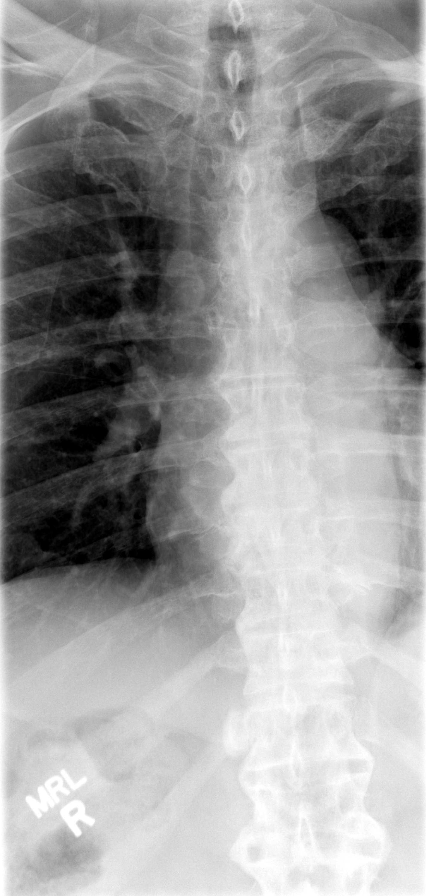

[t l-spine a.p.]
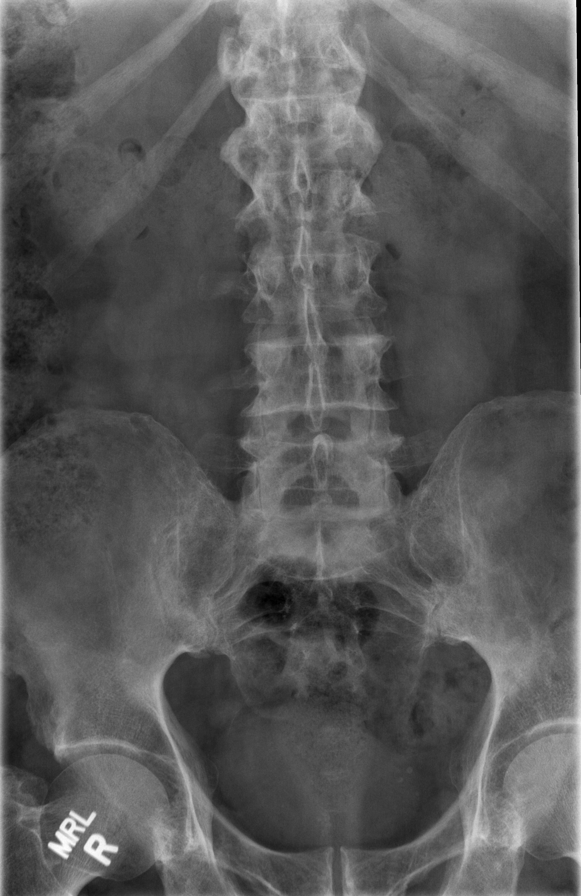

[9 of 10 positions shown; findings below may reference images not displayed]

FINDINGS: No calvarial lesions are identified.  No change in
mineralization of the skull.  Cervical spondylosis is unchanged.
Cervical spinal mineralization appears unchanged.

T9 compression fracture appears unchanged compared to the prior
exam of 06/05/2009.  L1 compression fracture also appears little
changed.

Striated mineralization of the lumbar spine, compatible with
previously seen osteolysis and marrow replacing process.  Small
osteolytic lesion in the left inferior pubic ramus appears
unchanged.  Faint permeative changes in the proximal left femur may
be slightly improved.

No large plasmacytoma is identified.  Small lesion in the proximal
right humeral diaphysis is unchanged.  The lucency in the base of
the acromion is not as well appreciated on today's examination
because of overlapping bone.  Similarly, the ostial lysis on the
left in the acromion is not as well seen as on prior.
IMPRESSION: Little changed compared to the prior exam with multiple osteolytic
lesions throughout the axial and appendicular skeleton.
Thoracolumbar compression fractures are unchanged.  No new lesions
are identified.  There may be a slight improvement in the
permeative changes in the proximal left femur.

## 2011-05-20 ENCOUNTER — Other Ambulatory Visit: Payer: Self-pay | Admitting: Oncology

## 2011-05-20 ENCOUNTER — Ambulatory Visit (HOSPITAL_COMMUNITY)
Admission: RE | Admit: 2011-05-20 | Discharge: 2011-05-20 | Disposition: A | Discharge status: Home / Self Care | Payer: Medicare Other | Source: Ambulatory Visit | Attending: Oncology | Admitting: Oncology

## 2011-05-20 ENCOUNTER — Encounter (HOSPITAL_BASED_OUTPATIENT_CLINIC_OR_DEPARTMENT_OTHER): Payer: Medicare Other | Admitting: Oncology

## 2011-05-20 DIAGNOSIS — M7989 Other specified soft tissue disorders: Secondary | ICD-10-CM | POA: Insufficient documentation

## 2011-05-20 DIAGNOSIS — C9000 Multiple myeloma not having achieved remission: Secondary | ICD-10-CM

## 2011-05-20 DIAGNOSIS — Z452 Encounter for adjustment and management of vascular access device: Secondary | ICD-10-CM

## 2011-05-20 DIAGNOSIS — I10 Essential (primary) hypertension: Secondary | ICD-10-CM

## 2011-05-20 DIAGNOSIS — M79609 Pain in unspecified limb: Secondary | ICD-10-CM | POA: Insufficient documentation

## 2011-05-20 DIAGNOSIS — I824Z9 Acute embolism and thrombosis of unspecified deep veins of unspecified distal lower extremity: Secondary | ICD-10-CM | POA: Insufficient documentation

## 2011-05-20 DIAGNOSIS — N189 Chronic kidney disease, unspecified: Secondary | ICD-10-CM

## 2011-05-20 LAB — PROTIME-INR

## 2011-05-20 LAB — BASIC METABOLIC PANEL
CO2: 26 mEq/L (ref 19–32)
Calcium: 8.2 mg/dL — ABNORMAL LOW (ref 8.4–10.5)
Creatinine, Ser: 1.65 mg/dL — ABNORMAL HIGH (ref 0.40–1.50)
Glucose, Bld: 224 mg/dL — ABNORMAL HIGH (ref 70–99)

## 2011-05-28 ENCOUNTER — Other Ambulatory Visit: Payer: Self-pay | Admitting: Oncology

## 2011-05-28 ENCOUNTER — Encounter (HOSPITAL_BASED_OUTPATIENT_CLINIC_OR_DEPARTMENT_OTHER): Payer: Medicare Other | Admitting: Oncology

## 2011-05-28 DIAGNOSIS — I129 Hypertensive chronic kidney disease with stage 1 through stage 4 chronic kidney disease, or unspecified chronic kidney disease: Secondary | ICD-10-CM

## 2011-05-28 DIAGNOSIS — Z7901 Long term (current) use of anticoagulants: Secondary | ICD-10-CM

## 2011-05-28 DIAGNOSIS — I10 Essential (primary) hypertension: Secondary | ICD-10-CM

## 2011-05-28 DIAGNOSIS — C9000 Multiple myeloma not having achieved remission: Secondary | ICD-10-CM

## 2011-05-28 DIAGNOSIS — N189 Chronic kidney disease, unspecified: Secondary | ICD-10-CM

## 2011-05-28 LAB — CBC WITH DIFFERENTIAL/PLATELET
BASO%: 1.4 % (ref 0.0–2.0)
Basophils Absolute: 0 10e3/uL (ref 0.0–0.1)
EOS%: 6 % (ref 0.0–7.0)
Eosinophils Absolute: 0.2 10e3/uL (ref 0.0–0.5)
HCT: 35.7 % — ABNORMAL LOW (ref 38.4–49.9)
HGB: 12.1 g/dL — ABNORMAL LOW (ref 13.0–17.1)
LYMPH%: 21.9 % (ref 14.0–49.0)
MCH: 30.5 pg (ref 27.2–33.4)
MCHC: 34 g/dL (ref 32.0–36.0)
MCV: 89.8 fL (ref 79.3–98.0)
MONO#: 0.3 10e3/uL (ref 0.1–0.9)
MONO%: 8.5 % (ref 0.0–14.0)
NEUT#: 1.9 10e3/uL (ref 1.5–6.5)
NEUT%: 62.2 % (ref 39.0–75.0)
Platelets: 99 10e3/uL — ABNORMAL LOW (ref 140–400)
RBC: 3.97 10e6/uL — ABNORMAL LOW (ref 4.20–5.82)
RDW: 14.2 % (ref 11.0–14.6)
WBC: 3 10e3/uL — ABNORMAL LOW (ref 4.0–10.3)
lymph#: 0.7 10e3/uL — ABNORMAL LOW (ref 0.9–3.3)

## 2011-05-29 LAB — COMPREHENSIVE METABOLIC PANEL
AST: 15 U/L (ref 0–37)
Alkaline Phosphatase: 148 U/L — ABNORMAL HIGH (ref 39–117)
BUN: 16 mg/dL (ref 6–23)
Creatinine, Ser: 1.62 mg/dL — ABNORMAL HIGH (ref 0.50–1.35)

## 2011-05-29 LAB — KAPPA/LAMBDA LIGHT CHAINS
Kappa free light chain: 2.85 mg/dL — ABNORMAL HIGH (ref 0.33–1.94)
Lambda Free Lght Chn: 4.31 mg/dL — ABNORMAL HIGH (ref 0.57–2.63)

## 2011-06-06 ENCOUNTER — Encounter (HOSPITAL_BASED_OUTPATIENT_CLINIC_OR_DEPARTMENT_OTHER): Payer: Medicare Other | Admitting: Oncology

## 2011-06-06 ENCOUNTER — Other Ambulatory Visit: Payer: Self-pay | Admitting: Oncology

## 2011-06-06 DIAGNOSIS — I129 Hypertensive chronic kidney disease with stage 1 through stage 4 chronic kidney disease, or unspecified chronic kidney disease: Secondary | ICD-10-CM

## 2011-06-06 DIAGNOSIS — I10 Essential (primary) hypertension: Secondary | ICD-10-CM

## 2011-06-06 DIAGNOSIS — N189 Chronic kidney disease, unspecified: Secondary | ICD-10-CM

## 2011-06-06 DIAGNOSIS — Z7901 Long term (current) use of anticoagulants: Secondary | ICD-10-CM

## 2011-06-06 DIAGNOSIS — C9000 Multiple myeloma not having achieved remission: Secondary | ICD-10-CM

## 2011-06-06 LAB — CBC WITH DIFFERENTIAL/PLATELET
BASO%: 1.4 % (ref 0.0–2.0)
Basophils Absolute: 0 10*3/uL (ref 0.0–0.1)
HCT: 35.8 % — ABNORMAL LOW (ref 38.4–49.9)
HGB: 11.9 g/dL — ABNORMAL LOW (ref 13.0–17.1)
MCHC: 33.3 g/dL (ref 32.0–36.0)
MONO#: 0.3 10*3/uL (ref 0.1–0.9)
NEUT%: 62.6 % (ref 39.0–75.0)
WBC: 3 10*3/uL — ABNORMAL LOW (ref 4.0–10.3)
lymph#: 0.6 10*3/uL — ABNORMAL LOW (ref 0.9–3.3)

## 2011-06-06 LAB — PROTIME-INR

## 2011-06-09 ENCOUNTER — Other Ambulatory Visit: Payer: Self-pay | Admitting: Oncology

## 2011-06-09 LAB — COMPREHENSIVE METABOLIC PANEL
ALT: 26 U/L (ref 0–53)
BUN: 15 mg/dL (ref 6–23)
CO2: 26 mEq/L (ref 19–32)
Calcium: 8.2 mg/dL — ABNORMAL LOW (ref 8.4–10.5)
Chloride: 106 mEq/L (ref 96–112)
Creatinine, Ser: 1.64 mg/dL — ABNORMAL HIGH (ref 0.50–1.35)
Glucose, Bld: 144 mg/dL — ABNORMAL HIGH (ref 70–99)

## 2011-06-09 LAB — KAPPA/LAMBDA LIGHT CHAINS
Kappa free light chain: 2.88 mg/dL — ABNORMAL HIGH (ref 0.33–1.94)
Kappa:Lambda Ratio: 0.81 (ref 0.26–1.65)

## 2011-06-09 LAB — LACTATE DEHYDROGENASE: LDH: 150 U/L (ref 94–250)

## 2011-06-11 LAB — UIFE/LIGHT CHAINS/TP QN, 24-HR UR
Albumin, U: DETECTED
Free Lambda Lt Chains,Ur: 3.05 mg/dL — ABNORMAL HIGH (ref 0.02–0.67)
Gamma Globulin, Urine: DETECTED — AB

## 2011-06-13 ENCOUNTER — Other Ambulatory Visit: Payer: Self-pay | Admitting: Oncology

## 2011-06-13 ENCOUNTER — Encounter (HOSPITAL_BASED_OUTPATIENT_CLINIC_OR_DEPARTMENT_OTHER): Payer: Medicare Other | Admitting: Oncology

## 2011-06-13 DIAGNOSIS — N189 Chronic kidney disease, unspecified: Secondary | ICD-10-CM

## 2011-06-13 DIAGNOSIS — C9000 Multiple myeloma not having achieved remission: Secondary | ICD-10-CM

## 2011-06-13 DIAGNOSIS — I10 Essential (primary) hypertension: Secondary | ICD-10-CM

## 2011-06-13 LAB — CBC WITH DIFFERENTIAL/PLATELET
Basophils Absolute: 0.1 10*3/uL (ref 0.0–0.1)
Eosinophils Absolute: 0.2 10*3/uL (ref 0.0–0.5)
HCT: 34.9 % — ABNORMAL LOW (ref 38.4–49.9)
HGB: 11.7 g/dL — ABNORMAL LOW (ref 13.0–17.1)
LYMPH%: 24 % (ref 14.0–49.0)
MCV: 87.5 fL (ref 79.3–98.0)
MONO#: 0.2 10*3/uL (ref 0.1–0.9)
MONO%: 7.3 % (ref 0.0–14.0)
NEUT#: 1.8 10*3/uL (ref 1.5–6.5)
NEUT%: 57.8 % (ref 39.0–75.0)
Platelets: 88 10*3/uL — ABNORMAL LOW (ref 140–400)
RBC: 3.99 10*6/uL — ABNORMAL LOW (ref 4.20–5.82)

## 2011-06-13 LAB — PROTIME-INR: Protime: 40.8 Seconds — ABNORMAL HIGH (ref 10.6–13.4)

## 2011-06-17 ENCOUNTER — Encounter (HOSPITAL_BASED_OUTPATIENT_CLINIC_OR_DEPARTMENT_OTHER): Payer: Medicare Other | Admitting: Oncology

## 2011-06-17 DIAGNOSIS — C9000 Multiple myeloma not having achieved remission: Secondary | ICD-10-CM

## 2011-06-17 DIAGNOSIS — N189 Chronic kidney disease, unspecified: Secondary | ICD-10-CM

## 2011-06-20 ENCOUNTER — Other Ambulatory Visit: Payer: Self-pay | Admitting: Oncology

## 2011-06-20 ENCOUNTER — Encounter (HOSPITAL_BASED_OUTPATIENT_CLINIC_OR_DEPARTMENT_OTHER): Payer: Medicare Other | Admitting: Oncology

## 2011-06-20 DIAGNOSIS — N189 Chronic kidney disease, unspecified: Secondary | ICD-10-CM

## 2011-06-20 DIAGNOSIS — Z7901 Long term (current) use of anticoagulants: Secondary | ICD-10-CM

## 2011-06-20 DIAGNOSIS — C9000 Multiple myeloma not having achieved remission: Secondary | ICD-10-CM

## 2011-06-20 LAB — PROTIME-INR

## 2011-06-26 ENCOUNTER — Encounter (HOSPITAL_BASED_OUTPATIENT_CLINIC_OR_DEPARTMENT_OTHER): Payer: Medicare Other | Admitting: Oncology

## 2011-06-26 ENCOUNTER — Other Ambulatory Visit: Payer: Self-pay | Admitting: Oncology

## 2011-06-26 DIAGNOSIS — Z86718 Personal history of other venous thrombosis and embolism: Secondary | ICD-10-CM

## 2011-06-26 DIAGNOSIS — N189 Chronic kidney disease, unspecified: Secondary | ICD-10-CM

## 2011-06-26 DIAGNOSIS — C9000 Multiple myeloma not having achieved remission: Secondary | ICD-10-CM

## 2011-06-26 DIAGNOSIS — I10 Essential (primary) hypertension: Secondary | ICD-10-CM

## 2011-06-26 DIAGNOSIS — Z7901 Long term (current) use of anticoagulants: Secondary | ICD-10-CM

## 2011-06-26 LAB — CBC WITH DIFFERENTIAL/PLATELET
BASO%: 3.2 % — ABNORMAL HIGH (ref 0.0–2.0)
LYMPH%: 28.7 % (ref 14.0–49.0)
MCHC: 33.5 g/dL (ref 32.0–36.0)
MONO#: 0.3 10*3/uL (ref 0.1–0.9)
RBC: 3.91 10*6/uL — ABNORMAL LOW (ref 4.20–5.82)
RDW: 15.2 % — ABNORMAL HIGH (ref 11.0–14.6)
WBC: 2.8 10*3/uL — ABNORMAL LOW (ref 4.0–10.3)
lymph#: 0.8 10*3/uL — ABNORMAL LOW (ref 0.9–3.3)
nRBC: 0 % (ref 0–0)

## 2011-06-26 LAB — PROTIME-INR
INR: 2.1 (ref 2.00–3.50)
Protime: 25.2 Seconds — ABNORMAL HIGH (ref 10.6–13.4)

## 2011-07-02 ENCOUNTER — Other Ambulatory Visit: Payer: Self-pay | Admitting: Oncology

## 2011-07-02 ENCOUNTER — Encounter (HOSPITAL_BASED_OUTPATIENT_CLINIC_OR_DEPARTMENT_OTHER): Payer: Medicare Other | Admitting: Oncology

## 2011-07-02 DIAGNOSIS — I10 Essential (primary) hypertension: Secondary | ICD-10-CM

## 2011-07-02 DIAGNOSIS — C9000 Multiple myeloma not having achieved remission: Secondary | ICD-10-CM

## 2011-07-02 DIAGNOSIS — N189 Chronic kidney disease, unspecified: Secondary | ICD-10-CM

## 2011-07-02 LAB — CBC WITH DIFFERENTIAL/PLATELET
BASO%: 1.4 % (ref 0.0–2.0)
EOS%: 5.9 % (ref 0.0–7.0)
HCT: 32.7 % — ABNORMAL LOW (ref 38.4–49.9)
LYMPH%: 27.5 % (ref 14.0–49.0)
MCH: 30.9 pg (ref 27.2–33.4)
MCHC: 34.4 g/dL (ref 32.0–36.0)
NEUT%: 57.8 % (ref 39.0–75.0)
lymph#: 0.6 10*3/uL — ABNORMAL LOW (ref 0.9–3.3)

## 2011-07-02 LAB — PROTIME-INR: INR: 2.6 (ref 2.00–3.50)

## 2011-07-04 ENCOUNTER — Encounter (HOSPITAL_BASED_OUTPATIENT_CLINIC_OR_DEPARTMENT_OTHER): Payer: Medicare Other | Admitting: Oncology

## 2011-07-04 ENCOUNTER — Other Ambulatory Visit: Payer: Self-pay | Admitting: Oncology

## 2011-07-04 DIAGNOSIS — C9000 Multiple myeloma not having achieved remission: Secondary | ICD-10-CM

## 2011-07-04 LAB — IMMUNOFIXATION ELECTROPHORESIS
IgG (Immunoglobin G), Serum: 771 mg/dL (ref 650–1600)
Total Protein, Serum Electrophoresis: 6 g/dL (ref 6.0–8.3)

## 2011-07-04 LAB — COMPREHENSIVE METABOLIC PANEL
Albumin: 4.1 g/dL (ref 3.5–5.2)
BUN: 18 mg/dL (ref 6–23)
Calcium: 7.9 mg/dL — ABNORMAL LOW (ref 8.4–10.5)
Chloride: 108 mEq/L (ref 96–112)
Glucose, Bld: 108 mg/dL — ABNORMAL HIGH (ref 70–99)
Potassium: 3.8 mEq/L (ref 3.5–5.3)

## 2011-07-09 LAB — UIFE/LIGHT CHAINS/TP QN, 24-HR UR
Albumin, U: DETECTED
Free Kappa/Lambda Ratio: 7.05 ratio (ref 2.04–10.37)
Free Lambda Excretion/Day: 146.3 mg/d
Free Lambda Lt Chains,Ur: 8.36 mg/dL — ABNORMAL HIGH (ref 0.02–0.67)
Time: 24 hours
Total Protein, Urine-Ur/day: 1258 mg/d — ABNORMAL HIGH (ref 10–140)
Total Protein, Urine: 71.9 mg/dL
Volume, Urine: 1750 mL

## 2011-07-15 ENCOUNTER — Other Ambulatory Visit: Payer: Self-pay | Admitting: Oncology

## 2011-07-15 ENCOUNTER — Encounter (HOSPITAL_BASED_OUTPATIENT_CLINIC_OR_DEPARTMENT_OTHER): Payer: Medicare Other | Admitting: Oncology

## 2011-07-15 DIAGNOSIS — N189 Chronic kidney disease, unspecified: Secondary | ICD-10-CM

## 2011-07-15 DIAGNOSIS — C9000 Multiple myeloma not having achieved remission: Secondary | ICD-10-CM

## 2011-07-15 LAB — BASIC METABOLIC PANEL
CO2: 23 mEq/L (ref 19–32)
Chloride: 108 mEq/L (ref 96–112)
Glucose, Bld: 201 mg/dL — ABNORMAL HIGH (ref 70–99)
Potassium: 3.9 mEq/L (ref 3.5–5.3)
Sodium: 141 mEq/L (ref 135–145)

## 2011-07-15 LAB — CBC WITH DIFFERENTIAL/PLATELET
Eosinophils Absolute: 0.1 10*3/uL (ref 0.0–0.5)
LYMPH%: 29.2 % (ref 14.0–49.0)
MCHC: 34.4 g/dL (ref 32.0–36.0)
MCV: 87.1 fL (ref 79.3–98.0)
MONO%: 9.2 % (ref 0.0–14.0)
NEUT#: 1.5 10*3/uL (ref 1.5–6.5)
Platelets: 96 10*3/uL — ABNORMAL LOW (ref 140–400)
RBC: 4.04 10*6/uL — ABNORMAL LOW (ref 4.20–5.82)
nRBC: 0 % (ref 0–0)

## 2011-07-15 LAB — PROTIME-INR: Protime: 26.4 Seconds — ABNORMAL HIGH (ref 10.6–13.4)

## 2011-08-05 ENCOUNTER — Encounter (HOSPITAL_BASED_OUTPATIENT_CLINIC_OR_DEPARTMENT_OTHER): Payer: Medicare Other | Admitting: Oncology

## 2011-08-05 ENCOUNTER — Other Ambulatory Visit: Payer: Self-pay | Admitting: Oncology

## 2011-08-05 DIAGNOSIS — N189 Chronic kidney disease, unspecified: Secondary | ICD-10-CM

## 2011-08-05 DIAGNOSIS — C9000 Multiple myeloma not having achieved remission: Secondary | ICD-10-CM

## 2011-08-05 LAB — CBC WITH DIFFERENTIAL/PLATELET
Eosinophils Absolute: 0.1 10*3/uL (ref 0.0–0.5)
HCT: 38.4 % (ref 38.4–49.9)
LYMPH%: 17.9 % (ref 14.0–49.0)
MONO#: 0.2 10*3/uL (ref 0.1–0.9)
NEUT#: 2.9 10*3/uL (ref 1.5–6.5)
NEUT%: 72.3 % (ref 39.0–75.0)
Platelets: 105 10*3/uL — ABNORMAL LOW (ref 140–400)
RBC: 4.13 10*6/uL — ABNORMAL LOW (ref 4.20–5.82)
WBC: 4 10*3/uL (ref 4.0–10.3)

## 2011-08-06 LAB — KAPPA/LAMBDA LIGHT CHAINS: Kappa free light chain: 1.09 mg/dL (ref 0.33–1.94)

## 2011-08-13 ENCOUNTER — Encounter (HOSPITAL_BASED_OUTPATIENT_CLINIC_OR_DEPARTMENT_OTHER): Payer: Medicare Other | Admitting: Oncology

## 2011-08-13 ENCOUNTER — Other Ambulatory Visit: Payer: Self-pay | Admitting: Oncology

## 2011-08-13 DIAGNOSIS — I82409 Acute embolism and thrombosis of unspecified deep veins of unspecified lower extremity: Secondary | ICD-10-CM

## 2011-08-13 DIAGNOSIS — N189 Chronic kidney disease, unspecified: Secondary | ICD-10-CM

## 2011-08-13 DIAGNOSIS — C9000 Multiple myeloma not having achieved remission: Secondary | ICD-10-CM

## 2011-08-13 DIAGNOSIS — Z7901 Long term (current) use of anticoagulants: Secondary | ICD-10-CM

## 2011-08-13 LAB — CBC WITH DIFFERENTIAL/PLATELET
Eosinophils Absolute: 0.1 10*3/uL (ref 0.0–0.5)
HGB: 12.8 g/dL — ABNORMAL LOW (ref 13.0–17.1)
MCV: 90.3 fL (ref 79.3–98.0)
MONO%: 6.3 % (ref 0.0–14.0)
NEUT#: 2.7 10*3/uL (ref 1.5–6.5)
RBC: 4.24 10*6/uL (ref 4.20–5.82)
RDW: 16 % — ABNORMAL HIGH (ref 11.0–14.6)
WBC: 3.7 10*3/uL — ABNORMAL LOW (ref 4.0–10.3)
lymph#: 0.6 10*3/uL — ABNORMAL LOW (ref 0.9–3.3)
nRBC: 0 % (ref 0–0)

## 2011-08-13 LAB — PROTIME-INR
INR: 1.7 — ABNORMAL LOW (ref 2.00–3.50)
Protime: 20.4 Seconds — ABNORMAL HIGH (ref 10.6–13.4)

## 2011-08-20 ENCOUNTER — Encounter (HOSPITAL_BASED_OUTPATIENT_CLINIC_OR_DEPARTMENT_OTHER): Payer: Medicare Other | Admitting: Oncology

## 2011-08-20 ENCOUNTER — Other Ambulatory Visit: Payer: Self-pay | Admitting: Oncology

## 2011-08-20 DIAGNOSIS — N189 Chronic kidney disease, unspecified: Secondary | ICD-10-CM

## 2011-08-20 DIAGNOSIS — Z7901 Long term (current) use of anticoagulants: Secondary | ICD-10-CM

## 2011-08-20 DIAGNOSIS — Z86718 Personal history of other venous thrombosis and embolism: Secondary | ICD-10-CM

## 2011-08-20 DIAGNOSIS — C9000 Multiple myeloma not having achieved remission: Secondary | ICD-10-CM

## 2011-08-26 ENCOUNTER — Other Ambulatory Visit: Payer: Self-pay | Admitting: Oncology

## 2011-08-26 ENCOUNTER — Encounter (HOSPITAL_BASED_OUTPATIENT_CLINIC_OR_DEPARTMENT_OTHER): Payer: Medicare Other | Admitting: Oncology

## 2011-08-26 DIAGNOSIS — N189 Chronic kidney disease, unspecified: Secondary | ICD-10-CM

## 2011-08-26 DIAGNOSIS — Z7901 Long term (current) use of anticoagulants: Secondary | ICD-10-CM

## 2011-08-26 DIAGNOSIS — E119 Type 2 diabetes mellitus without complications: Secondary | ICD-10-CM

## 2011-08-26 LAB — CBC WITH DIFFERENTIAL/PLATELET
BASO%: 0.5 % (ref 0.0–2.0)
EOS%: 6.2 % (ref 0.0–7.0)
HCT: 36 % — ABNORMAL LOW (ref 38.4–49.9)
MCH: 31.8 pg (ref 27.2–33.4)
MCHC: 34.2 g/dL (ref 32.0–36.0)
NEUT%: 64.6 % (ref 39.0–75.0)
lymph#: 0.4 10*3/uL — ABNORMAL LOW (ref 0.9–3.3)

## 2011-08-26 LAB — PROTIME-INR

## 2011-08-28 LAB — UIFE/LIGHT CHAINS/TP QN, 24-HR UR
Alpha 1, Urine: DETECTED — AB
Alpha 2, Urine: DETECTED — AB
Free Kappa/Lambda Ratio: 6.78 ratio (ref 2.04–10.37)
Free Lambda Excretion/Day: 91.39 mg/d
Free Lt Chn Excr Rate: 619.85 mg/d
Total Protein, Urine-Ur/day: 742 mg/d — ABNORMAL HIGH (ref 10–140)
Total Protein, Urine: 30.3 mg/dL

## 2011-08-28 LAB — CREATININE CLEARANCE, URINE, 24 HOUR
Creatinine: 1.41 mg/dL — ABNORMAL HIGH (ref 0.50–1.35)
Urine Total Volume-CRCL: 2450 mL

## 2011-09-01 LAB — IMMUNOFIXATION ELECTROPHORESIS
IgG (Immunoglobin G), Serum: 752 mg/dL (ref 650–1600)
Total Protein, Serum Electrophoresis: 6.1 g/dL (ref 6.0–8.3)

## 2011-09-01 LAB — COMPREHENSIVE METABOLIC PANEL
ALT: 15 U/L (ref 0–53)
AST: 14 U/L (ref 0–37)
Alkaline Phosphatase: 134 U/L — ABNORMAL HIGH (ref 39–117)
Calcium: 8.2 mg/dL — ABNORMAL LOW (ref 8.4–10.5)
Chloride: 105 mEq/L (ref 96–112)
Creatinine, Ser: 1.41 mg/dL — ABNORMAL HIGH (ref 0.50–1.35)
Potassium: 3.9 mEq/L (ref 3.5–5.3)

## 2011-09-01 LAB — KAPPA/LAMBDA LIGHT CHAINS
Kappa free light chain: 1.85 mg/dL (ref 0.33–1.94)
Kappa:Lambda Ratio: 0.78 (ref 0.26–1.65)
Lambda Free Lght Chn: 2.38 mg/dL (ref 0.57–2.63)

## 2011-09-01 LAB — LACTATE DEHYDROGENASE: LDH: 153 U/L (ref 94–250)

## 2011-09-02 ENCOUNTER — Other Ambulatory Visit: Payer: Self-pay | Admitting: Oncology

## 2011-09-02 ENCOUNTER — Encounter (HOSPITAL_BASED_OUTPATIENT_CLINIC_OR_DEPARTMENT_OTHER): Payer: Medicare Other | Admitting: Oncology

## 2011-09-02 DIAGNOSIS — N189 Chronic kidney disease, unspecified: Secondary | ICD-10-CM

## 2011-09-02 DIAGNOSIS — Z7901 Long term (current) use of anticoagulants: Secondary | ICD-10-CM

## 2011-09-02 DIAGNOSIS — Z86718 Personal history of other venous thrombosis and embolism: Secondary | ICD-10-CM

## 2011-09-02 DIAGNOSIS — R350 Frequency of micturition: Secondary | ICD-10-CM

## 2011-09-02 DIAGNOSIS — C9000 Multiple myeloma not having achieved remission: Secondary | ICD-10-CM

## 2011-09-02 LAB — CBC WITH DIFFERENTIAL/PLATELET
BASO%: 0.9 % (ref 0.0–2.0)
Eosinophils Absolute: 0 10*3/uL (ref 0.0–0.5)
HCT: 37.1 % — ABNORMAL LOW (ref 38.4–49.9)
HGB: 12.6 g/dL — ABNORMAL LOW (ref 13.0–17.1)
MCHC: 34 g/dL (ref 32.0–36.0)
MONO#: 0.3 10*3/uL (ref 0.1–0.9)
NEUT#: 2.1 10*3/uL (ref 1.5–6.5)
NEUT%: 69.2 % (ref 39.0–75.0)
Platelets: 104 10*3/uL — ABNORMAL LOW (ref 140–400)
WBC: 3.1 10*3/uL — ABNORMAL LOW (ref 4.0–10.3)
lymph#: 0.6 10*3/uL — ABNORMAL LOW (ref 0.9–3.3)

## 2011-09-02 LAB — PROTIME-INR

## 2011-09-03 LAB — KAPPA/LAMBDA LIGHT CHAINS
Kappa free light chain: 1.1 mg/dL (ref 0.33–1.94)
Lambda Free Lght Chn: 1.89 mg/dL (ref 0.57–2.63)

## 2011-09-03 LAB — COMPREHENSIVE METABOLIC PANEL
Alkaline Phosphatase: 140 U/L — ABNORMAL HIGH (ref 39–117)
BUN: 19 mg/dL (ref 6–23)
Glucose, Bld: 221 mg/dL — ABNORMAL HIGH (ref 70–99)
Sodium: 140 mEq/L (ref 135–145)
Total Bilirubin: 0.6 mg/dL (ref 0.3–1.2)

## 2011-09-17 ENCOUNTER — Encounter (HOSPITAL_BASED_OUTPATIENT_CLINIC_OR_DEPARTMENT_OTHER): Payer: Medicare Other | Admitting: Oncology

## 2011-09-17 ENCOUNTER — Other Ambulatory Visit: Payer: Self-pay | Admitting: Oncology

## 2011-09-17 DIAGNOSIS — Z86718 Personal history of other venous thrombosis and embolism: Secondary | ICD-10-CM

## 2011-09-17 DIAGNOSIS — C9000 Multiple myeloma not having achieved remission: Secondary | ICD-10-CM

## 2011-09-17 DIAGNOSIS — Z7901 Long term (current) use of anticoagulants: Secondary | ICD-10-CM

## 2011-09-17 DIAGNOSIS — N189 Chronic kidney disease, unspecified: Secondary | ICD-10-CM

## 2011-09-17 LAB — CBC WITH DIFFERENTIAL/PLATELET
Basophils Absolute: 0 10*3/uL (ref 0.0–0.1)
Eosinophils Absolute: 0.1 10*3/uL (ref 0.0–0.5)
HGB: 12.7 g/dL — ABNORMAL LOW (ref 13.0–17.1)
MONO#: 0.3 10*3/uL (ref 0.1–0.9)
NEUT#: 1.8 10*3/uL (ref 1.5–6.5)
RBC: 4.18 10*6/uL — ABNORMAL LOW (ref 4.20–5.82)
RDW: 14.9 % — ABNORMAL HIGH (ref 11.0–14.6)
WBC: 3 10*3/uL — ABNORMAL LOW (ref 4.0–10.3)
lymph#: 0.7 10*3/uL — ABNORMAL LOW (ref 0.9–3.3)
nRBC: 0 % (ref 0–0)

## 2011-09-17 LAB — PROTIME-INR
INR: 3.7 — ABNORMAL HIGH (ref 2.00–3.50)
Protime: 44.4 Seconds — ABNORMAL HIGH (ref 10.6–13.4)

## 2011-09-30 ENCOUNTER — Encounter (HOSPITAL_BASED_OUTPATIENT_CLINIC_OR_DEPARTMENT_OTHER): Payer: Medicare Other | Admitting: Oncology

## 2011-09-30 ENCOUNTER — Other Ambulatory Visit: Payer: Self-pay | Admitting: Oncology

## 2011-09-30 ENCOUNTER — Encounter: Payer: Self-pay | Admitting: *Deleted

## 2011-09-30 DIAGNOSIS — Z86718 Personal history of other venous thrombosis and embolism: Secondary | ICD-10-CM

## 2011-09-30 DIAGNOSIS — Z7901 Long term (current) use of anticoagulants: Secondary | ICD-10-CM

## 2011-09-30 DIAGNOSIS — N189 Chronic kidney disease, unspecified: Secondary | ICD-10-CM

## 2011-09-30 DIAGNOSIS — C9000 Multiple myeloma not having achieved remission: Secondary | ICD-10-CM

## 2011-09-30 LAB — PROTIME-INR
INR: 3.5 (ref 2.00–3.50)
Protime: 42 Seconds — ABNORMAL HIGH (ref 10.6–13.4)

## 2011-09-30 LAB — CBC WITH DIFFERENTIAL/PLATELET
BASO%: 0.7 % (ref 0.0–2.0)
Eosinophils Absolute: 0.1 10*3/uL (ref 0.0–0.5)
LYMPH%: 17.7 % (ref 14.0–49.0)
MCHC: 33.8 g/dL (ref 32.0–36.0)
MONO#: 0.2 10*3/uL (ref 0.1–0.9)
MONO%: 6.1 % (ref 0.0–14.0)
NEUT#: 2.4 10*3/uL (ref 1.5–6.5)
RBC: 4.23 10*6/uL (ref 4.20–5.82)
RDW: 15.6 % — ABNORMAL HIGH (ref 11.0–14.6)
WBC: 3.3 10*3/uL — ABNORMAL LOW (ref 4.0–10.3)

## 2011-10-02 LAB — KAPPA/LAMBDA LIGHT CHAINS
Kappa free light chain: 3.35 mg/dL — ABNORMAL HIGH (ref 0.33–1.94)
Kappa:Lambda Ratio: 1.35 (ref 0.26–1.65)

## 2011-10-02 LAB — COMPREHENSIVE METABOLIC PANEL
ALT: 15 U/L (ref 0–53)
Albumin: 4.5 g/dL (ref 3.5–5.2)
CO2: 25 mEq/L (ref 19–32)
Glucose, Bld: 202 mg/dL — ABNORMAL HIGH (ref 70–99)
Potassium: 3.8 mEq/L (ref 3.5–5.3)
Sodium: 141 mEq/L (ref 135–145)
Total Protein: 6.5 g/dL (ref 6.0–8.3)

## 2011-10-02 LAB — IMMUNOFIXATION ELECTROPHORESIS
IgG (Immunoglobin G), Serum: 784 mg/dL (ref 650–1600)
Total Protein, Serum Electrophoresis: 6.5 g/dL (ref 6.0–8.3)

## 2011-10-14 ENCOUNTER — Other Ambulatory Visit: Payer: Self-pay | Admitting: Oncology

## 2011-10-14 ENCOUNTER — Encounter (HOSPITAL_BASED_OUTPATIENT_CLINIC_OR_DEPARTMENT_OTHER): Payer: Medicare Other | Admitting: Oncology

## 2011-10-14 DIAGNOSIS — Z86718 Personal history of other venous thrombosis and embolism: Secondary | ICD-10-CM

## 2011-10-14 DIAGNOSIS — Z7901 Long term (current) use of anticoagulants: Secondary | ICD-10-CM

## 2011-10-14 DIAGNOSIS — C9000 Multiple myeloma not having achieved remission: Secondary | ICD-10-CM

## 2011-10-14 DIAGNOSIS — N189 Chronic kidney disease, unspecified: Secondary | ICD-10-CM

## 2011-10-14 LAB — CBC WITH DIFFERENTIAL/PLATELET
BASO%: 1.1 % (ref 0.0–2.0)
EOS%: 3.4 % (ref 0.0–7.0)
LYMPH%: 22.2 % (ref 14.0–49.0)
MCH: 30.4 pg (ref 27.2–33.4)
MCHC: 34 g/dL (ref 32.0–36.0)
MCV: 89.4 fL (ref 79.3–98.0)
MONO%: 8.3 % (ref 0.0–14.0)
Platelets: 94 10*3/uL — ABNORMAL LOW (ref 140–400)
RBC: 4.24 10*6/uL (ref 4.20–5.82)
RDW: 14.8 % — ABNORMAL HIGH (ref 11.0–14.6)
nRBC: 0 % (ref 0–0)

## 2011-10-14 LAB — PROTIME-INR
INR: 3.4 (ref 2.00–3.50)
Protime: 40.8 Seconds — ABNORMAL HIGH (ref 10.6–13.4)

## 2011-10-23 DIAGNOSIS — I82409 Acute embolism and thrombosis of unspecified deep veins of unspecified lower extremity: Secondary | ICD-10-CM | POA: Insufficient documentation

## 2011-10-27 ENCOUNTER — Encounter: Payer: Self-pay | Admitting: *Deleted

## 2011-10-27 NOTE — Progress Notes (Signed)
RECEIVED A FAX FROM [facility] CONCERNING A REVLIMID DELIVERY NOTIFICATION. THIS NOTICE WAS GIVEN TO DR.GRANFORTUNA, NURSE, DONNICA HARRIS,RN.

## 2011-10-28 ENCOUNTER — Other Ambulatory Visit: Payer: Self-pay | Admitting: Oncology

## 2011-10-28 ENCOUNTER — Ambulatory Visit: Payer: Medicare Other

## 2011-10-28 ENCOUNTER — Other Ambulatory Visit (HOSPITAL_BASED_OUTPATIENT_CLINIC_OR_DEPARTMENT_OTHER): Payer: Medicare Other

## 2011-10-28 DIAGNOSIS — N189 Chronic kidney disease, unspecified: Secondary | ICD-10-CM

## 2011-10-28 DIAGNOSIS — I82409 Acute embolism and thrombosis of unspecified deep veins of unspecified lower extremity: Secondary | ICD-10-CM

## 2011-10-28 DIAGNOSIS — Z86718 Personal history of other venous thrombosis and embolism: Secondary | ICD-10-CM

## 2011-10-28 DIAGNOSIS — C9000 Multiple myeloma not having achieved remission: Secondary | ICD-10-CM

## 2011-10-28 LAB — CBC WITH DIFFERENTIAL/PLATELET
BASO%: 0.4 % (ref 0.0–2.0)
EOS%: 2.1 % (ref 0.0–7.0)
HCT: 39.8 % (ref 38.4–49.9)
MCH: 31.3 pg (ref 27.2–33.4)
MCHC: 33.9 g/dL (ref 32.0–36.0)
MONO#: 0.2 10*3/uL (ref 0.1–0.9)
NEUT%: 70.3 % (ref 39.0–75.0)
RDW: 15.4 % — ABNORMAL HIGH (ref 11.0–14.6)
WBC: 2.7 10*3/uL — ABNORMAL LOW (ref 4.0–10.3)
lymph#: 0.5 10*3/uL — ABNORMAL LOW (ref 0.9–3.3)

## 2011-10-28 LAB — PROTIME-INR: Protime: 25.2 Seconds — ABNORMAL HIGH (ref 10.6–13.4)

## 2011-10-28 LAB — POCT INR: INR: 2.1

## 2011-10-30 LAB — COMPREHENSIVE METABOLIC PANEL
ALT: 16 U/L (ref 0–53)
AST: 15 U/L (ref 0–37)
Albumin: 4.6 g/dL (ref 3.5–5.2)
Alkaline Phosphatase: 169 U/L — ABNORMAL HIGH (ref 39–117)
BUN: 16 mg/dL (ref 6–23)
Calcium: 8.8 mg/dL (ref 8.4–10.5)
Chloride: 104 mEq/L (ref 96–112)
Potassium: 3.9 mEq/L (ref 3.5–5.3)
Sodium: 139 mEq/L (ref 135–145)
Total Protein: 6.6 g/dL (ref 6.0–8.3)

## 2011-10-30 LAB — IMMUNOFIXATION ELECTROPHORESIS: IgA: 68 mg/dL (ref 68–379)

## 2011-10-31 LAB — UIFE/LIGHT CHAINS/TP QN, 24-HR UR
Beta, Urine: DETECTED — AB
Free Kappa Lt Chains,Ur: 48 mg/dL — ABNORMAL HIGH (ref 0.14–2.42)
Free Kappa/Lambda Ratio: 7.27 ratio (ref 2.04–10.37)
Free Lambda Lt Chains,Ur: 6.6 mg/dL — ABNORMAL HIGH (ref 0.02–0.67)
Free Lt Chn Excr Rate: 672 mg/d
Total Protein, Urine-Ur/day: 795 mg/d — ABNORMAL HIGH (ref 10–140)
Total Protein, Urine: 56.8 mg/dL
Volume, Urine: 1400 mL

## 2011-11-04 ENCOUNTER — Telehealth: Payer: Self-pay | Admitting: Oncology

## 2011-11-04 ENCOUNTER — Ambulatory Visit: Payer: Medicare Other

## 2011-11-04 ENCOUNTER — Other Ambulatory Visit (HOSPITAL_BASED_OUTPATIENT_CLINIC_OR_DEPARTMENT_OTHER): Payer: Medicare Other | Admitting: Lab

## 2011-11-04 ENCOUNTER — Ambulatory Visit (HOSPITAL_BASED_OUTPATIENT_CLINIC_OR_DEPARTMENT_OTHER): Payer: Self-pay | Admitting: Pharmacist

## 2011-11-04 ENCOUNTER — Other Ambulatory Visit: Payer: Self-pay | Admitting: Oncology

## 2011-11-04 ENCOUNTER — Ambulatory Visit (HOSPITAL_BASED_OUTPATIENT_CLINIC_OR_DEPARTMENT_OTHER): Payer: Medicare Other | Admitting: Nurse Practitioner

## 2011-11-04 DIAGNOSIS — I82409 Acute embolism and thrombosis of unspecified deep veins of unspecified lower extremity: Secondary | ICD-10-CM

## 2011-11-04 DIAGNOSIS — E119 Type 2 diabetes mellitus without complications: Secondary | ICD-10-CM

## 2011-11-04 DIAGNOSIS — C9000 Multiple myeloma not having achieved remission: Secondary | ICD-10-CM

## 2011-11-04 DIAGNOSIS — Z86718 Personal history of other venous thrombosis and embolism: Secondary | ICD-10-CM

## 2011-11-04 DIAGNOSIS — Z7901 Long term (current) use of anticoagulants: Secondary | ICD-10-CM

## 2011-11-04 LAB — POCT INR: INR: 2.1

## 2011-11-04 NOTE — Progress Notes (Signed)
Pt doing well.  Wants to eat a small amt of collard greens on Thanksgiving.  I told him small portion. Marily Lente, Pharm.D.

## 2011-11-04 NOTE — Progress Notes (Signed)
OFFICE PROGRESS NOTE  Interval history:  [name] is a 70 year old man with a lambda light chain multiple myeloma. He is currently on Revlimid 10 mg 21 days on followed by a 7 day break. Serum immunoglobulins on 10/28/2011 were all in normal range. Serum IFE was negative for monoclonal protein. 24-hour urine collection on 10/28/2011 showed a total protein of 795 mg (742 mg on a 24-hour urine collection 08/26/2011). The urine was positive for monoclonal free light chains.  [name] reports "weakness" during the 21 days he is taking Revlimid. He feels better during the 7 day break. He denies nausea or vomiting. The neuropathy symptoms in his feet are better. Bowels moving with the aid of a laxative. He denies bleeding. He recently has noted pain in the region of the shoulder blades after eating. "Belching" eases the pain. He intermittently notes "soreness" in the region of his stomach and in the chest. The soreness also tends to occur after eating.   Objective: Blood pressure 118/71, pulse 74, temperature 97.1 F (36.2 C), temperature source Oral, height 5\' 10"  (1.778 m), weight 220 lb 12.8 oz (100.154 kg).  Oropharynx is without thrush or ulceration. No palpable cervical, subclavicular, axillary lymph nodes. Lungs are clear. No wheezes or rales. Regular cardiac rhythm. Abdomen is soft and nontender. No organomegaly. Extremities are without edema. Motor strength is 5 over 5.  Labs PT 25.2 INR 2.1  Labs done 10/28/2011 hemoglobin 13.5 white count 2.7 absolute neutrophil count 1.9 platelet count 99  sodium 139 potassium 3.9 BUN 16 creatinine 1.47 bilirubin 0.9 alkaline phosphatase 169 SGOT 15 SGPT 16 total protein 6.6 albumin 4.6 calcium 8.8      Studies/Results: No results found.  Medications: I have reviewed the patient's current medications.  Assessment/Plan: 1.     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  Initial treatment with RVD.  Velcade discontinued  due to neuropathy.  Complete response at time of follow-up bone marrow biopsy 10/25/2009.  He was referred to [facility] where he had stem cell collection then received melphalan 140 mg/sq m 03/12/2010 with autologous stem cell support. In March of 2012 there were signs of disease activity with the appearance of lambda monoclonal free light chains in the serum and urine.  He started on Revlimid 10 mg daily subsequent to visit here on 04/09/2011.   Due to progressive cumulative myelosuppression on the Revlimid, the dose was decreased from 10 mg daily down to 10 mg 3 weeks on and 1 week off beginning at time of his last visit here on 07/04/2011.  Counts have overall stabilized. Recent 24-hour urine was stable. 2.     Type 2 diabetes:  He continues metformin. 3.     Right lower extremity deep vein thrombosis 05/14/2011:  He continues full-dose Coumadin anticoagulation.  4.     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  He continues Lyrica. 5.     Essential hypertension.  Controlled on current medication. 6.     Asymptomatic bradycardia.   7.     Prior renal insufficiency. Disposition: [name] will continue Revlimid at the current dose and schedule. We will continue to check labs on a 2 week schedule. He will return for a followup visit with Dr. Cyndie Chime in January 2013. He will contact the office in the interim with any problems. For the postprandial symptoms he is experiencing he will try an over-the-counter antacid.  Plan reviewed with Dr.  Granfortuna.   Lonna Cobb ANP/GNP-BC

## 2011-11-04 NOTE — Telephone Encounter (Signed)
gve the pt's wife the nov-jan 2013 appt calendar.

## 2011-11-11 ENCOUNTER — Other Ambulatory Visit: Payer: Self-pay | Admitting: Oncology

## 2011-11-11 ENCOUNTER — Other Ambulatory Visit (HOSPITAL_BASED_OUTPATIENT_CLINIC_OR_DEPARTMENT_OTHER): Payer: Medicare Other | Admitting: Lab

## 2011-11-11 DIAGNOSIS — Z7901 Long term (current) use of anticoagulants: Secondary | ICD-10-CM

## 2011-11-11 DIAGNOSIS — Z86718 Personal history of other venous thrombosis and embolism: Secondary | ICD-10-CM

## 2011-11-11 DIAGNOSIS — N189 Chronic kidney disease, unspecified: Secondary | ICD-10-CM

## 2011-11-11 DIAGNOSIS — I82409 Acute embolism and thrombosis of unspecified deep veins of unspecified lower extremity: Secondary | ICD-10-CM

## 2011-11-11 DIAGNOSIS — Z5181 Encounter for therapeutic drug level monitoring: Secondary | ICD-10-CM

## 2011-11-11 LAB — CBC WITH DIFFERENTIAL/PLATELET
Basophils Absolute: 0.1 10*3/uL (ref 0.0–0.1)
Eosinophils Absolute: 0.1 10*3/uL (ref 0.0–0.5)
HGB: 12.9 g/dL — ABNORMAL LOW (ref 13.0–17.1)
MONO#: 0.2 10*3/uL (ref 0.1–0.9)
NEUT#: 1.7 10*3/uL (ref 1.5–6.5)
RBC: 4.26 10*6/uL (ref 4.20–5.82)
RDW: 15.1 % — ABNORMAL HIGH (ref 11.0–14.6)
WBC: 2.9 10*3/uL — ABNORMAL LOW (ref 4.0–10.3)
nRBC: 0 % (ref 0–0)

## 2011-11-11 LAB — PROTIME-INR
INR: 2.1 (ref 2.00–3.50)
Protime: 25.2 Seconds — ABNORMAL HIGH (ref 10.6–13.4)

## 2011-11-12 ENCOUNTER — Encounter: Payer: Self-pay | Admitting: *Deleted

## 2011-11-12 NOTE — Progress Notes (Signed)
[facility] faxed Revlimid refill request.  Request to MD for review.

## 2011-11-14 ENCOUNTER — Other Ambulatory Visit: Payer: Self-pay | Admitting: *Deleted

## 2011-11-14 DIAGNOSIS — C9000 Multiple myeloma not having achieved remission: Secondary | ICD-10-CM

## 2011-11-14 MED ORDER — LENALIDOMIDE 10 MG PO CAPS: 10.0000 mg | ORAL_CAPSULE | Freq: Every day | ORAL | Status: DC

## 2011-11-19 ENCOUNTER — Encounter: Payer: Self-pay | Admitting: *Deleted

## 2011-11-19 NOTE — Progress Notes (Signed)
[facility] faxed delivery notification for Revlimid.  Revlimid shipped 11-18-11 with next day delivery.

## 2011-11-25 ENCOUNTER — Other Ambulatory Visit (HOSPITAL_BASED_OUTPATIENT_CLINIC_OR_DEPARTMENT_OTHER): Payer: Medicare Other | Admitting: Lab

## 2011-11-25 ENCOUNTER — Ambulatory Visit: Payer: Medicare Other

## 2011-11-25 DIAGNOSIS — C9000 Multiple myeloma not having achieved remission: Secondary | ICD-10-CM

## 2011-11-25 DIAGNOSIS — I82409 Acute embolism and thrombosis of unspecified deep veins of unspecified lower extremity: Secondary | ICD-10-CM

## 2011-11-25 LAB — POCT INR: INR: 2.1

## 2011-11-25 LAB — PROTIME-INR: INR: 2.1 (ref 2.00–3.50)

## 2011-11-25 LAB — CBC WITH DIFFERENTIAL/PLATELET
BASO%: 1.1 % (ref 0.0–2.0)
Basophils Absolute: 0 10*3/uL (ref 0.0–0.1)
Eosinophils Absolute: 0.1 10*3/uL (ref 0.0–0.5)
HCT: 39 % (ref 38.4–49.9)
HGB: 13.2 g/dL (ref 13.0–17.1)
MCHC: 33.8 g/dL (ref 32.0–36.0)
MONO#: 0.3 10*3/uL (ref 0.1–0.9)
NEUT#: 2.4 10*3/uL (ref 1.5–6.5)
NEUT%: 67.9 % (ref 39.0–75.0)
WBC: 3.5 10*3/uL — ABNORMAL LOW (ref 4.0–10.3)
lymph#: 0.8 10*3/uL — ABNORMAL LOW (ref 0.9–3.3)

## 2011-12-09 ENCOUNTER — Other Ambulatory Visit: Payer: Self-pay | Admitting: *Deleted

## 2011-12-09 ENCOUNTER — Other Ambulatory Visit: Payer: Self-pay

## 2011-12-09 ENCOUNTER — Other Ambulatory Visit (HOSPITAL_BASED_OUTPATIENT_CLINIC_OR_DEPARTMENT_OTHER): Payer: Medicare Other | Admitting: Lab

## 2011-12-09 DIAGNOSIS — I82409 Acute embolism and thrombosis of unspecified deep veins of unspecified lower extremity: Secondary | ICD-10-CM

## 2011-12-09 DIAGNOSIS — C9000 Multiple myeloma not having achieved remission: Secondary | ICD-10-CM

## 2011-12-09 LAB — CBC WITH DIFFERENTIAL/PLATELET
EOS%: 2 % (ref 0.0–7.0)
Eosinophils Absolute: 0.1 10*3/uL (ref 0.0–0.5)
MCV: 89.5 fL (ref 79.3–98.0)
MONO%: 6.9 % (ref 0.0–14.0)
NEUT#: 1.6 10*3/uL (ref 1.5–6.5)
RBC: 4.3 10*6/uL (ref 4.20–5.82)
RDW: 15.2 % — ABNORMAL HIGH (ref 11.0–14.6)
lymph#: 0.6 10*3/uL — ABNORMAL LOW (ref 0.9–3.3)
nRBC: 0 % (ref 0–0)

## 2011-12-09 LAB — PROTIME-INR: Protime: 27.6 Seconds — ABNORMAL HIGH (ref 10.6–13.4)

## 2011-12-09 MED ORDER — LENALIDOMIDE 10 MG PO CAPS: 10.0000 mg | ORAL_CAPSULE | Freq: Every day | ORAL | Status: DC

## 2011-12-09 NOTE — Telephone Encounter (Signed)
THIS REQUEST WAS PLACED IN DR.GRANFORTUNA'S ACTIVE WORK BOX.

## 2011-12-24 ENCOUNTER — Encounter: Payer: Self-pay | Admitting: Oncology

## 2011-12-24 ENCOUNTER — Other Ambulatory Visit: Payer: Self-pay | Admitting: Oncology

## 2011-12-24 DIAGNOSIS — I82409 Acute embolism and thrombosis of unspecified deep veins of unspecified lower extremity: Secondary | ICD-10-CM

## 2011-12-24 DIAGNOSIS — I10 Essential (primary) hypertension: Secondary | ICD-10-CM

## 2011-12-24 DIAGNOSIS — C9002 Multiple myeloma in relapse: Secondary | ICD-10-CM

## 2011-12-24 HISTORY — DX: Essential (primary) hypertension: I10

## 2011-12-24 HISTORY — DX: Multiple myeloma in relapse: C90.02

## 2011-12-26 ENCOUNTER — Ambulatory Visit (HOSPITAL_BASED_OUTPATIENT_CLINIC_OR_DEPARTMENT_OTHER): Payer: Medicare Other | Admitting: Oncology

## 2011-12-26 ENCOUNTER — Ambulatory Visit: Payer: Medicare Other

## 2011-12-26 ENCOUNTER — Ambulatory Visit (HOSPITAL_BASED_OUTPATIENT_CLINIC_OR_DEPARTMENT_OTHER): Payer: Self-pay | Admitting: Oncology

## 2011-12-26 ENCOUNTER — Encounter: Payer: Self-pay | Admitting: Oncology

## 2011-12-26 ENCOUNTER — Telehealth: Payer: Self-pay | Admitting: *Deleted

## 2011-12-26 ENCOUNTER — Ambulatory Visit (HOSPITAL_COMMUNITY)
Admission: RE | Admit: 2011-12-26 | Discharge: 2011-12-26 | Disposition: A | Discharge status: Home / Self Care | Payer: Medicare Other | Source: Ambulatory Visit | Attending: Oncology | Admitting: Oncology

## 2011-12-26 ENCOUNTER — Other Ambulatory Visit: Payer: Self-pay | Admitting: Pharmacist

## 2011-12-26 ENCOUNTER — Other Ambulatory Visit: Payer: Medicare Other | Admitting: Lab

## 2011-12-26 ENCOUNTER — Telehealth: Payer: Self-pay | Admitting: Oncology

## 2011-12-26 VITALS — BP 116/76 | HR 81 | Temp 97.1°F | Wt 219.3 lb

## 2011-12-26 DIAGNOSIS — B029 Zoster without complications: Secondary | ICD-10-CM

## 2011-12-26 DIAGNOSIS — C9002 Multiple myeloma in relapse: Secondary | ICD-10-CM | POA: Insufficient documentation

## 2011-12-26 DIAGNOSIS — I82409 Acute embolism and thrombosis of unspecified deep veins of unspecified lower extremity: Secondary | ICD-10-CM

## 2011-12-26 DIAGNOSIS — E119 Type 2 diabetes mellitus without complications: Secondary | ICD-10-CM

## 2011-12-26 DIAGNOSIS — I824Z9 Acute embolism and thrombosis of unspecified deep veins of unspecified distal lower extremity: Secondary | ICD-10-CM

## 2011-12-26 DIAGNOSIS — E782 Mixed hyperlipidemia: Secondary | ICD-10-CM

## 2011-12-26 DIAGNOSIS — N189 Chronic kidney disease, unspecified: Secondary | ICD-10-CM

## 2011-12-26 DIAGNOSIS — I10 Essential (primary) hypertension: Secondary | ICD-10-CM

## 2011-12-26 DIAGNOSIS — M545 Low back pain, unspecified: Secondary | ICD-10-CM | POA: Insufficient documentation

## 2011-12-26 DIAGNOSIS — M47817 Spondylosis without myelopathy or radiculopathy, lumbosacral region: Secondary | ICD-10-CM

## 2011-12-26 DIAGNOSIS — M47816 Spondylosis without myelopathy or radiculopathy, lumbar region: Secondary | ICD-10-CM | POA: Insufficient documentation

## 2011-12-26 DIAGNOSIS — R079 Chest pain, unspecified: Secondary | ICD-10-CM | POA: Insufficient documentation

## 2011-12-26 DIAGNOSIS — K219 Gastro-esophageal reflux disease without esophagitis: Secondary | ICD-10-CM | POA: Insufficient documentation

## 2011-12-26 HISTORY — DX: Type 2 diabetes mellitus without complications: E11.9

## 2011-12-26 HISTORY — DX: Mixed hyperlipidemia: E78.2

## 2011-12-26 HISTORY — DX: Chronic kidney disease, unspecified: N18.9

## 2011-12-26 HISTORY — DX: Spondylosis without myelopathy or radiculopathy, lumbar region: M47.816

## 2011-12-26 LAB — PROTIME-INR
INR: 1.9 — ABNORMAL LOW (ref 2.00–3.50)
Protime: 22.8 Seconds — ABNORMAL HIGH (ref 10.6–13.4)

## 2011-12-26 LAB — CBC WITH DIFFERENTIAL/PLATELET
EOS%: 0.8 % (ref 0.0–7.0)
MCH: 31.2 pg (ref 27.2–33.4)
MCV: 93.6 fL (ref 79.3–98.0)
MONO%: 8.8 % (ref 0.0–14.0)
NEUT#: 1.8 10*3/uL (ref 1.5–6.5)
RBC: 4.28 10*6/uL (ref 4.20–5.82)
RDW: 16.3 % — ABNORMAL HIGH (ref 11.0–14.6)

## 2011-12-26 NOTE — Telephone Encounter (Signed)
appt made for 1/15 lab and 1/22 lisa per dr g,printed for pt   aom

## 2011-12-26 NOTE — Progress Notes (Signed)
Hematology and Oncology Follow Up Visit  [name] [name] [id number] 01/01/1941 71 y.o. 12/26/2011 7:27 PM   Principle Diagnosis: Encounter Diagnoses  Name Primary?  . Multiple myeloma in relapse Yes  . DVT (deep venous thrombosis)   . GERD (gastroesophageal reflux disease)   . CRI (chronic renal insufficiency)   . DJD (degenerative joint disease) of lumbar spine   . DVT of lower extremity (deep venous thrombosis)   . Hyperlipidemia, mixed   . Herpes zoster infection   . DM II (diabetes mellitus, type II), controlled      Interim History:   Followup visit for this 71 year old man with lambda light chain multiple myeloma initially diagnosed in June of 2010 when he presented with hip and rib pain and was found to have multiple lytic bone lesions. Serum immunoglobulins all suppressed. Monoclonal lambda on immunofixation electrophoresis. Marked elevation of serum free lambda light chain at 3.5 g. Initial urine with 6.7 g of protein primarily free lambda light chains. Bone marrow biopsy with 87% plasma cells. He was induced into a remission with a combination of Velcade plus dexamethasone with subsequent addition of Revlimid. Velcade had to be stopped due to progressive neuropathy. He went on to receive IV melphalan with autologous bone marrow support at [facility] on 03/12/10. Course complicated by a herpes zoster infection of a cervical dermatome. He remained off treatment until small amount of monoclonal lambda free light chain again found in the urine in March 2012. He was started on Revlimid 10 mg daily beginning 04/09/11.  He continues to complain of nonspecific fatigue. He has poorly localized bone pain primarily in the low back and across his chest with additional areas of discomfort in his shoulders which he feels have been progressive over the last few months. Last bone survey done 03/05/11 did not show any new abnormalities. Most recent MRI of his spine done 06/02/09 was stable at that time.  His  other new complaint is that of progressive dysphagia for solid foods. He feels that food is not getting down properly but getting stuck around the chest. He has known reflux esophagitis. He is not currently taking any antacids or proton pump inhibitors. His symptoms are not worse at night.  He lost a 82 year old sister over the recent holidays not clear what the cause of death was possibly a stroke.    Medications: reviewed  Allergies: No Known Allergies  Review of Systems: Constitutional:   Persistent malaise and fatigue  Respiratory: Intermittent dyspnea on exertion no cough  Cardiovascular:  No ischemic chest pain or pressure no palpitations Gastrointestinal: Chronic constipation relieved with laxatives Genito-Urinary: No urinary tract symptoms Musculoskeletal: See above Neurologic: And minimal paresthesias and dysesthesias in his feet  Skin: Not assessed Remaining ROS negative.  Physical Exam: Blood pressure 116/76, pulse 81, temperature 97.1 F (36.2 C), temperature source Oral, weight 219 lb 4.8 oz (99.474 kg). Wt Readings from Last 3 Encounters:  12/26/11 219 lb 4.8 oz (99.474 kg)  11/04/11 220 lb 12.8 oz (100.154 kg)  09/02/11 221 lb 4 oz (100.358 kg)     General appearance: Well-nourished Head: Normal Neck: Full range of motion Lymph nodes: No adenopathy Breasts: Not examined Lungs: Clear to auscultation and resonant to percussion  Heart: Regular rhythm no murmur Abdomen: Soft nontender no mass no organomegaly Extremities: No edema no calf tenderness Vascular: No cyanosis Neurologic: Motor strength 5 over 5 reflexes absent symmetric at the knees 1+ symmetric at the biceps Skin: No rash or ecchymoses  Lab Results: Lab  Results  Component Value Date   WBC 2.6* 12/26/2011   HGB 13.3 12/26/2011   HCT 40.0 12/26/2011   MCV 93.6 12/26/2011   PLT 102* 12/26/2011     Chemistry      Component Value Date/Time   NA 140 12/26/2011 1151   NA 145 06/13/2009 1503   K 3.9 12/26/2011  1151   K 4.8* 06/13/2009 1503   CL 105 12/26/2011 1151   CL 108 06/13/2009 1503   CO2 25 12/26/2011 1151   CO2 29 06/13/2009 1503   BUN 18 12/26/2011 1151   BUN 49* 06/13/2009 1503   CREATININE 1.44* 12/26/2011 1151   CREATININE 1.41* 08/26/2011 1140   CREATININE 1.41* 08/26/2011 1140   CREATININE 1.41* 08/26/2011 1140   CREATININE 1.41* 08/26/2011 1140   CREATININE 1.41* 08/26/2011 1140   CREATININE 2.9* 06/13/2009 1503      Component Value Date/Time   CALCIUM 8.8 12/26/2011 1151   CALCIUM 10.5* 06/13/2009 1503   ALKPHOS 133* 12/26/2011 1151   ALKPHOS 135* 06/13/2009 1503   AST 15 12/26/2011 1151   AST 21 06/13/2009 1503   ALT 20 12/26/2011 1151   BILITOT 0.7 12/26/2011 1151   BILITOT 0.60 06/13/2009 1503       Radiological Studies: Dg Bone Survey Met  12/26/2011  *RADIOLOGY REPORT*  Clinical Data: Increased low back and sternal pain.  Known multiple myeloma.  METASTATIC BONE SURVEY  Comparison: 03/05/2011.  Findings:  Lateral skull:  Unremarkable.  AP and lateral cervical spine:  Stable degenerative changes.  No lytic lesions.  AP and lateral thoracic spine:  Stable degenerative changes. Stable T9 and L1 compression deformities.  No lytic lesions.  AP and lateral lumbar spine:  Stable degenerative changes.  Stable 10% L1 superior endplate compression deformity.  A striated appearance of the vertebrae is unchanged with no discrete lytic lesions.  AP pelvis:  No significant change in multiple small, ill-defined lucencies in the pubic bones and proximal femurs bilaterally.  AP femurs:  No significant change in small, poorly defined areas of lucency in both proximal femurs.  AP humeri:  No significant change in small poorly defined lucencies in both proximal humeri and small, better defined lucency in the proximal right humeral diaphysis.  AP shoulders:  No significant change in multiple small, poorly defined lucencies in the proximal humeri and scapulae.  IMPRESSION: No significant change in multiple small lytic lesions in  the axial and appendicular skeleton, as described above.  Mild T9 and L1 vertebral compression deformities are unchanged.  No acute abnormalities are seen.  Original Report Authenticated By: Darrol Angel, M.D.     Impression and Plan: #1. Lambda light chain myeloma treated as outlined above. Despite the appearance of some monoclonal lambda light chain protein in the urine, total urine protein overall stable down from peak of 1.3 g in July 2 most recent value of 795 mg in November. Serum free light chain ratio remains normal at 1.35 as of 09/30/11. Despite his complaints of progressive bone pain, there are no obvious new changes on followup skeletal bone survey today there are of major concern. However, his symptoms are very similar to those that he presented with back in 2010. I told him that if there were no significant changes on the bone survey that I would likely need to do a followup bone marrow biopsy. I will contact the patient in this regard. I will continue low-dose Revlimid 10 mg daily 3 weeks on one week off for now.  #  2. Right lower extremity DVT Continue full dose Coumadin anticoagulation  #3 element of nonselective proteinuria and chronic renal from diabetes and hypertension.  Cc report: Dr Trula Slade                  Dr Verita Schneiders, Gordan Payment, MD 1/4/20137:27 PM

## 2011-12-26 NOTE — Telephone Encounter (Signed)
Pt's wife notified that pt's bone survey stable compared with march x-ray & no large, no new, no unstable spots per Dr. Cyndie Chime.

## 2011-12-26 NOTE — Progress Notes (Signed)
No complaints re: anticoag. Took Celexa for ~2 weeks; stopped due to tremors.  Otherwise, meds haven't changed. Cont. Same dose. Return 1 month. Marily Lente, Pharm.D.

## 2011-12-30 ENCOUNTER — Other Ambulatory Visit: Payer: Self-pay | Admitting: Oncology

## 2011-12-30 ENCOUNTER — Telehealth: Payer: Self-pay | Admitting: Pharmacist

## 2011-12-30 ENCOUNTER — Other Ambulatory Visit: Payer: Self-pay | Admitting: Nurse Practitioner

## 2011-12-30 ENCOUNTER — Other Ambulatory Visit: Payer: Self-pay | Admitting: *Deleted

## 2011-12-30 DIAGNOSIS — G629 Polyneuropathy, unspecified: Secondary | ICD-10-CM

## 2011-12-30 LAB — COMPREHENSIVE METABOLIC PANEL
AST: 15 U/L (ref 0–37)
Alkaline Phosphatase: 133 U/L — ABNORMAL HIGH (ref 39–117)
BUN: 18 mg/dL (ref 6–23)
Calcium: 8.8 mg/dL (ref 8.4–10.5)
Creatinine, Ser: 1.44 mg/dL — ABNORMAL HIGH (ref 0.50–1.35)

## 2011-12-30 LAB — KAPPA/LAMBDA LIGHT CHAINS
Kappa free light chain: 1.62 mg/dL (ref 0.33–1.94)
Lambda Free Lght Chn: 3.16 mg/dL — ABNORMAL HIGH (ref 0.57–2.63)

## 2011-12-30 LAB — IMMUNOFIXATION ELECTROPHORESIS
IgA: 67 mg/dL — ABNORMAL LOW (ref 68–379)
IgM, Serum: 68 mg/dL (ref 41–251)

## 2011-12-30 MED ORDER — PREGABALIN 100 MG PO CAPS: 100.0000 mg | ORAL_CAPSULE | Freq: Every day | ORAL | Status: DC

## 2011-12-30 NOTE — Telephone Encounter (Signed)
Patient scheduled for a colonoscopy on 01/09/12.  Received request from Dr. Randa Evens to assist with Coumadin management around the procedure. Per Dr. Patsy Lager instructions, he should come off of Coumadin for three days prior, and receive a standard Lovenox bridge in the interim.  It was not convenient for [name] [name] to discuss these plans or schedule his INR appointments at this time, but said he would call back.

## 2011-12-31 ENCOUNTER — Other Ambulatory Visit: Payer: Self-pay | Admitting: Pharmacist

## 2011-12-31 ENCOUNTER — Ambulatory Visit (HOSPITAL_BASED_OUTPATIENT_CLINIC_OR_DEPARTMENT_OTHER): Payer: Self-pay | Admitting: Oncology

## 2011-12-31 DIAGNOSIS — I82409 Acute embolism and thrombosis of unspecified deep veins of unspecified lower extremity: Secondary | ICD-10-CM

## 2011-12-31 NOTE — Patient Instructions (Signed)
01/06/12-01/08/12 - No Coumadin 01/07/12 - Lovenox 150 mg sq x 1 at [facility] (scheduler or RN to call w/ appt time) 01/08/12 - INR check 2 pm lab; 2:15 Coumadin clinic. No Lovenox this day. 01/09/12 - colonoscopy.  Restart Coumadin evening of procedure. 01/10/12 - Restart Lovenox 150 mg daily at [facility] (scheduler or RN to call w/ appts)

## 2011-12-31 NOTE — Progress Notes (Signed)
Pts wife called Coumadin clinic as follow up from our attempt to speak w/ pt yesterday re: anticoag bridging around colonoscopy planned for 01/09/12 w/ Dr. Randa Evens [facility]; [phone]; [phone]) I gave her instructions (she wrote down) as follows: 01/06/12-01/08/12 - No Coumadin 01/07/12 - Lovenox 150 mg sq x 1 at [facility] (scheduler or RN to call w/ appt time- Sabino Snipes, RN aware) 01/08/12 - INR check 2 pm lab; 2:15 Coumadin clinic (scheduled). No Lovenox this day. 01/09/12 - colonoscopy.  Restart Coumadin evening of procedure. 01/10/12 - Restart Lovenox 150 mg daily at [facility] (scheduler or RN to call w/ appts- Sabino Snipes, RN aware) & continue until INR = 2-3 I faxed these instructions to Dr. Dahlia Client office. When pt returns 01/08/12, we will need to set up appt for 01/15/12 for next protime & clinic visit or we can try to coordinate w/ inj. appts if more convenient for pt. Marily Lente, Pharm.D.

## 2012-01-02 ENCOUNTER — Other Ambulatory Visit: Payer: Self-pay | Admitting: *Deleted

## 2012-01-02 ENCOUNTER — Telehealth: Payer: Self-pay | Admitting: Oncology

## 2012-01-02 DIAGNOSIS — C9000 Multiple myeloma not having achieved remission: Secondary | ICD-10-CM

## 2012-01-02 MED ORDER — LENALIDOMIDE 10 MG PO CAPS: 10.0000 mg | ORAL_CAPSULE | Freq: Every day | ORAL | Status: DC

## 2012-01-02 NOTE — Telephone Encounter (Signed)
Called pt, left message regarding lovenox inj , lab and coumadin clinic January 2013

## 2012-01-02 NOTE — Telephone Encounter (Signed)
THIS REQUEST WAS PLACED IN DR.GRANFORTUNA'S ACTIVE WORK IN BOX. 

## 2012-01-06 ENCOUNTER — Other Ambulatory Visit: Payer: Self-pay | Admitting: *Deleted

## 2012-01-06 ENCOUNTER — Other Ambulatory Visit: Payer: Medicare Other

## 2012-01-06 MED ORDER — PANTOPRAZOLE SODIUM 40 MG PO TBEC: 40.0000 mg | DELAYED_RELEASE_TABLET | Freq: Every day | ORAL | Status: DC

## 2012-01-06 MED ORDER — OXYCODONE-ACETAMINOPHEN 5-325 MG PO TABS: 1.0000 | ORAL_TABLET | ORAL | Status: AC | PRN

## 2012-01-07 ENCOUNTER — Ambulatory Visit (HOSPITAL_BASED_OUTPATIENT_CLINIC_OR_DEPARTMENT_OTHER): Payer: Medicare Other

## 2012-01-07 ENCOUNTER — Ambulatory Visit: Payer: Medicare Other

## 2012-01-07 DIAGNOSIS — I824Z9 Acute embolism and thrombosis of unspecified deep veins of unspecified distal lower extremity: Secondary | ICD-10-CM

## 2012-01-07 DIAGNOSIS — I82409 Acute embolism and thrombosis of unspecified deep veins of unspecified lower extremity: Secondary | ICD-10-CM

## 2012-01-07 MED ORDER — ENOXAPARIN SODIUM 150 MG/ML ~~LOC~~ SOLN
150.0000 mg | Freq: Once | SUBCUTANEOUS | Status: AC
  Administered 2012-01-07: 150 mg via SUBCUTANEOUS
  Filled 2012-01-07: qty 1

## 2012-01-08 ENCOUNTER — Other Ambulatory Visit (HOSPITAL_BASED_OUTPATIENT_CLINIC_OR_DEPARTMENT_OTHER): Payer: Medicare Other | Admitting: Lab

## 2012-01-08 ENCOUNTER — Ambulatory Visit: Payer: Medicare Other

## 2012-01-08 ENCOUNTER — Ambulatory Visit: Payer: Self-pay | Admitting: Oncology

## 2012-01-08 DIAGNOSIS — I82409 Acute embolism and thrombosis of unspecified deep veins of unspecified lower extremity: Secondary | ICD-10-CM

## 2012-01-08 DIAGNOSIS — I824Z9 Acute embolism and thrombosis of unspecified deep veins of unspecified distal lower extremity: Secondary | ICD-10-CM

## 2012-01-08 NOTE — Progress Notes (Unsigned)
INR today = 1.8 prior to colonoscopy tomorrow.  No complaints per pt and his wife.  Spoke with Dr. Randa Evens [facility]; [phone]; [phone]) by phone today, and informed him of the INR results.   He is OK to continue with the procedure as scheduled with this INR today.  If Dr. Randa Evens needs to take a biopsy or excise a polyp, he will call the Coumadin Clinic at [phone] and let us know.  He would not want the pt to resume Coumadin on Friday if this was the case, but is OK with continuing Lovenox daily until he is confident that the wounds have had time to heal.  Pt is scheduled for a repeat INR on 01/15/12 at 1100.  I reviewed the anticoag plan with the pt and his wife today, and they communicated understanding after I clarified a few issues.

## 2012-01-10 ENCOUNTER — Ambulatory Visit (HOSPITAL_BASED_OUTPATIENT_CLINIC_OR_DEPARTMENT_OTHER): Payer: Medicare Other

## 2012-01-10 VITALS — BP 123/68 | HR 62 | Temp 97.4°F

## 2012-01-10 DIAGNOSIS — I82409 Acute embolism and thrombosis of unspecified deep veins of unspecified lower extremity: Secondary | ICD-10-CM

## 2012-01-10 MED ORDER — ENOXAPARIN SODIUM 150 MG/ML ~~LOC~~ SOLN
150.0000 mg | Freq: Once | SUBCUTANEOUS | Status: AC
  Administered 2012-01-10: 09:00:00 via SUBCUTANEOUS

## 2012-01-12 ENCOUNTER — Ambulatory Visit (HOSPITAL_BASED_OUTPATIENT_CLINIC_OR_DEPARTMENT_OTHER): Payer: Medicare Other

## 2012-01-12 DIAGNOSIS — I82409 Acute embolism and thrombosis of unspecified deep veins of unspecified lower extremity: Secondary | ICD-10-CM

## 2012-01-12 DIAGNOSIS — I824Z9 Acute embolism and thrombosis of unspecified deep veins of unspecified distal lower extremity: Secondary | ICD-10-CM

## 2012-01-12 MED ORDER — ENOXAPARIN SODIUM 150 MG/ML ~~LOC~~ SOLN
150.0000 mg | Freq: Once | SUBCUTANEOUS | Status: AC
  Administered 2012-01-12: 150 mg via SUBCUTANEOUS
  Filled 2012-01-12: qty 1

## 2012-01-13 ENCOUNTER — Ambulatory Visit (HOSPITAL_BASED_OUTPATIENT_CLINIC_OR_DEPARTMENT_OTHER): Payer: Medicare Other

## 2012-01-13 ENCOUNTER — Ambulatory Visit: Payer: Medicare Other | Admitting: Nurse Practitioner

## 2012-01-13 ENCOUNTER — Telehealth: Payer: Self-pay

## 2012-01-13 DIAGNOSIS — I82409 Acute embolism and thrombosis of unspecified deep veins of unspecified lower extremity: Secondary | ICD-10-CM

## 2012-01-13 DIAGNOSIS — I824Z9 Acute embolism and thrombosis of unspecified deep veins of unspecified distal lower extremity: Secondary | ICD-10-CM

## 2012-01-13 MED ORDER — ENOXAPARIN SODIUM 150 MG/ML ~~LOC~~ SOLN
150.0000 mg | Freq: Once | SUBCUTANEOUS | Status: AC
  Administered 2012-01-13: 150 mg via SUBCUTANEOUS
  Filled 2012-01-13: qty 1

## 2012-01-13 NOTE — Telephone Encounter (Signed)
Pt's spouse notified by phone that Revlimid has not been filled b/c they are waiting for pt to complete his pt survey.  Ms Thelin reports "he will do it right now."   dph

## 2012-01-14 ENCOUNTER — Ambulatory Visit (HOSPITAL_BASED_OUTPATIENT_CLINIC_OR_DEPARTMENT_OTHER): Payer: Medicare Other

## 2012-01-14 DIAGNOSIS — I82409 Acute embolism and thrombosis of unspecified deep veins of unspecified lower extremity: Secondary | ICD-10-CM

## 2012-01-14 DIAGNOSIS — I824Z9 Acute embolism and thrombosis of unspecified deep veins of unspecified distal lower extremity: Secondary | ICD-10-CM

## 2012-01-14 MED ORDER — ENOXAPARIN SODIUM 150 MG/ML ~~LOC~~ SOLN
150.0000 mg | Freq: Once | SUBCUTANEOUS | Status: AC
  Administered 2012-01-14: 150 mg via SUBCUTANEOUS
  Filled 2012-01-14: qty 1

## 2012-01-15 ENCOUNTER — Ambulatory Visit: Payer: Medicare Other

## 2012-01-15 ENCOUNTER — Ambulatory Visit (HOSPITAL_BASED_OUTPATIENT_CLINIC_OR_DEPARTMENT_OTHER): Payer: Medicare Other

## 2012-01-15 ENCOUNTER — Telehealth: Payer: Self-pay | Admitting: Oncology

## 2012-01-15 ENCOUNTER — Other Ambulatory Visit: Payer: Medicare Other | Admitting: Lab

## 2012-01-15 DIAGNOSIS — C9000 Multiple myeloma not having achieved remission: Secondary | ICD-10-CM

## 2012-01-15 DIAGNOSIS — I824Z9 Acute embolism and thrombosis of unspecified deep veins of unspecified distal lower extremity: Secondary | ICD-10-CM

## 2012-01-15 DIAGNOSIS — Z5181 Encounter for therapeutic drug level monitoring: Secondary | ICD-10-CM

## 2012-01-15 DIAGNOSIS — I82409 Acute embolism and thrombosis of unspecified deep veins of unspecified lower extremity: Secondary | ICD-10-CM

## 2012-01-15 DIAGNOSIS — Z7901 Long term (current) use of anticoagulants: Secondary | ICD-10-CM

## 2012-01-15 LAB — CBC WITH DIFFERENTIAL/PLATELET
BASO%: 1 % (ref 0.0–2.0)
HCT: 36.4 % — ABNORMAL LOW (ref 38.4–49.9)
LYMPH%: 22.6 % (ref 14.0–49.0)
MCHC: 34.3 g/dL (ref 32.0–36.0)
MCV: 88.8 fL (ref 79.3–98.0)
MONO#: 0.2 10*3/uL (ref 0.1–0.9)
MONO%: 9.1 % (ref 0.0–14.0)
NEUT%: 62 % (ref 39.0–75.0)
Platelets: 88 10*3/uL — ABNORMAL LOW (ref 140–400)
WBC: 2.1 10*3/uL — ABNORMAL LOW (ref 4.0–10.3)

## 2012-01-15 LAB — POCT INR: INR: 1.8

## 2012-01-15 LAB — PROTIME-INR

## 2012-01-15 MED ORDER — ENOXAPARIN SODIUM 150 MG/ML ~~LOC~~ SOLN
150.0000 mg | Freq: Once | SUBCUTANEOUS | Status: AC
  Administered 2012-01-15: 12:00:00 via SUBCUTANEOUS
  Filled 2012-01-15: qty 1

## 2012-01-15 NOTE — Progress Notes (Unsigned)
INR subtherapeutic today after coming off Coumadin for his colonoscopy on 01/09/12.  He resumed Coumadin on 01/09/12 as directed, and resumed Lovenox on 01/10/12 here at the [facility].  Mrs. Winchell reports that she also administered his Lovenox on Sunday without problems.  No complaints of bleeding or bruising.  No changes in meds or diet. No problems.  Since INR is subtherapeutic still, will continue Lovenox for now.  I had Liborio Nixon administer another Lovenox 150mg  SQ today, and the pt and his wife will make inj appts for tomorrow and Saturday.  Will also increase his dose slightly to 5mg  daily except 2.5mg  on MWF.  We will recheck PT/INR in 4 days.

## 2012-01-15 NOTE — Telephone Encounter (Signed)
Per Herbert Seta in Pharmacy the pt needs lovenox injs on 1/25 and 1/26.  Pt aware    Aom/ok with md

## 2012-01-16 ENCOUNTER — Encounter: Payer: Self-pay | Admitting: *Deleted

## 2012-01-16 ENCOUNTER — Ambulatory Visit (HOSPITAL_BASED_OUTPATIENT_CLINIC_OR_DEPARTMENT_OTHER): Payer: Medicare Other

## 2012-01-16 DIAGNOSIS — I82409 Acute embolism and thrombosis of unspecified deep veins of unspecified lower extremity: Secondary | ICD-10-CM

## 2012-01-16 DIAGNOSIS — I824Z9 Acute embolism and thrombosis of unspecified deep veins of unspecified distal lower extremity: Secondary | ICD-10-CM

## 2012-01-16 MED ORDER — ENOXAPARIN SODIUM 150 MG/ML ~~LOC~~ SOLN
150.0000 mg | Freq: Once | SUBCUTANEOUS | Status: AC
  Administered 2012-01-16: 150 mg via SUBCUTANEOUS
  Filled 2012-01-16: qty 1

## 2012-01-16 NOTE — Progress Notes (Signed)
RECEIVED A FAX FROM [facility] CONCERNING A CONFIRMATION OF PRESCRIPTION SHIPMENT FOR REVLIMID. 

## 2012-01-17 ENCOUNTER — Ambulatory Visit: Payer: Medicare Other

## 2012-01-19 ENCOUNTER — Other Ambulatory Visit: Payer: Medicare Other | Admitting: Lab

## 2012-01-19 ENCOUNTER — Ambulatory Visit (HOSPITAL_BASED_OUTPATIENT_CLINIC_OR_DEPARTMENT_OTHER): Payer: Medicare Other | Admitting: Pharmacist

## 2012-01-19 DIAGNOSIS — I824Z9 Acute embolism and thrombosis of unspecified deep veins of unspecified distal lower extremity: Secondary | ICD-10-CM

## 2012-01-19 DIAGNOSIS — I82409 Acute embolism and thrombosis of unspecified deep veins of unspecified lower extremity: Secondary | ICD-10-CM

## 2012-01-19 LAB — PROTIME-INR: INR: 2.1 (ref 2.00–3.50)

## 2012-01-19 NOTE — Progress Notes (Signed)
INR therapeutic (2.1).  No complaints.  No bleeding or bruising.  Only slightly sore at injection sites.  OK to discontinue Lovenox today.  Will continue current dose of 5mg  daily except 2.5mg  on MWF.  Recheck INR in 4 days with next MD visit, and then 1 week after that with Coumadin Clinic to ensure that he stays within therapeutic range.

## 2012-01-20 MED FILL — Enoxaparin Sodium Inj 150 MG/ML: SUBCUTANEOUS | Qty: 1 | Status: AC

## 2012-01-23 ENCOUNTER — Other Ambulatory Visit (HOSPITAL_BASED_OUTPATIENT_CLINIC_OR_DEPARTMENT_OTHER): Payer: Medicare Other | Admitting: Lab

## 2012-01-23 ENCOUNTER — Ambulatory Visit: Payer: Medicare Other | Admitting: Pharmacist

## 2012-01-23 DIAGNOSIS — I82409 Acute embolism and thrombosis of unspecified deep veins of unspecified lower extremity: Secondary | ICD-10-CM

## 2012-01-23 LAB — PROTIME-INR: INR: 2.4 (ref 2.00–3.50)

## 2012-01-23 NOTE — Progress Notes (Addendum)
INR at goal today.  Pt will continue same dose & return next Friday.  If INR ok then, we can see him back on q3-4 week basis. Pt's wife mentioned that pt has a loose tooth.  He needs it pulled.  She wondered if he needed to stop his Coumadin for the extraction.  I informed her that for routine dental work that is not surgical or invasive in nature, there is no need to stop Coumadin.  In Dr. Patsy Lager absence, I spoke w/ Dr. Truett Perna who agrees that it is not necessary for pt to go off Coumadin for extraction.   I called pt & his wife at home & left vm w/ this information.  I recommended that they take a copy of today's after visit summary with them to the DDS & when Mrs. Dyal makes dental appt she can also mention her husband is on Coumadin.  Pt seeing neurologist today about his arm/hand tremor.  Pt & his wife know to call us if med changes are made. Marily Lente, Pharm.D.

## 2012-01-26 ENCOUNTER — Telehealth: Payer: Self-pay | Admitting: *Deleted

## 2012-01-26 NOTE — Telephone Encounter (Signed)
Call received from Dr. Stormy Fabian at 1:42 pm.  Asked if patient has had any lytic lesions in his jaw, if he's been on any biophosphonates and if he is currently on any chemotherapy agents.  Asked who is monitoring his INR and when was INR checked last.  Sent patient information to dentist to assist.  She is not going to remove the tooth without knowing some information about the patient.

## 2012-01-30 ENCOUNTER — Other Ambulatory Visit: Payer: Medicare Other | Admitting: Lab

## 2012-01-30 ENCOUNTER — Ambulatory Visit (HOSPITAL_BASED_OUTPATIENT_CLINIC_OR_DEPARTMENT_OTHER): Payer: Medicare Other | Admitting: Pharmacist

## 2012-01-30 DIAGNOSIS — I82409 Acute embolism and thrombosis of unspecified deep veins of unspecified lower extremity: Secondary | ICD-10-CM

## 2012-01-30 DIAGNOSIS — C9000 Multiple myeloma not having achieved remission: Secondary | ICD-10-CM

## 2012-01-30 LAB — CBC WITH DIFFERENTIAL/PLATELET
Basophils Absolute: 0 10*3/uL (ref 0.0–0.1)
Eosinophils Absolute: 0.1 10*3/uL (ref 0.0–0.5)
HGB: 13.6 g/dL (ref 13.0–17.1)
LYMPH%: 18.2 % (ref 14.0–49.0)
MCV: 94 fL (ref 79.3–98.0)
MONO%: 2 % (ref 0.0–14.0)
NEUT#: 2.4 10*3/uL (ref 1.5–6.5)
Platelets: 89 10*3/uL — ABNORMAL LOW (ref 140–400)

## 2012-01-30 NOTE — Patient Instructions (Signed)
Continue 5 mg daily except 2.5 mg on MWF. If your dentist puts you on an antibiotic, please call our pharmacy [phone].  We may need to make changes to your Coumadin dose.

## 2012-01-30 NOTE — Progress Notes (Signed)
Pt went to dentist but no tooth pulled due to concern over exposure to bisphosphonate & low pltc.  Pt last dose of Zometa was 07/15/11. DDS wants to d/w Dr. Cyndie Chime when he returns from vacation (he is back 02/02/12).  DDS did not put pt on abx for tooth abscess. Pt going to neurologist today for consult re: tremor of R hand/arm. INR therapeutic.  Continue same dose. Return in 3 weeks unless plans change w/ dental work or med changes (ie. Addition of abx).  Pts wife knows to call our pharmacy if med changes are made. Marily Lente, Pharm.D.

## 2012-02-02 ENCOUNTER — Telehealth: Payer: Self-pay

## 2012-02-02 ENCOUNTER — Telehealth: Payer: Self-pay | Admitting: Pharmacist

## 2012-02-02 NOTE — Telephone Encounter (Signed)
Spoke with Morrie Sheldon at Dr [facility] office re: pt needing dental procedure.  Per Morrie Sheldon, dentist has received records from this office & will contact Dr Cyndie Chime as needed for advice/clearance prior to dental procedures.  dph

## 2012-02-06 ENCOUNTER — Other Ambulatory Visit: Payer: Self-pay | Admitting: Neurology

## 2012-02-06 ENCOUNTER — Other Ambulatory Visit: Payer: Medicare Other | Admitting: Lab

## 2012-02-06 DIAGNOSIS — E785 Hyperlipidemia, unspecified: Secondary | ICD-10-CM

## 2012-02-06 DIAGNOSIS — C9000 Multiple myeloma not having achieved remission: Secondary | ICD-10-CM

## 2012-02-06 DIAGNOSIS — G20A1 Parkinson's disease without dyskinesia, without mention of fluctuations: Secondary | ICD-10-CM

## 2012-02-06 DIAGNOSIS — G20 Parkinson's disease: Secondary | ICD-10-CM

## 2012-02-06 DIAGNOSIS — E119 Type 2 diabetes mellitus without complications: Secondary | ICD-10-CM

## 2012-02-09 ENCOUNTER — Other Ambulatory Visit: Payer: Self-pay | Admitting: *Deleted

## 2012-02-09 ENCOUNTER — Other Ambulatory Visit: Payer: Self-pay

## 2012-02-09 DIAGNOSIS — C9000 Multiple myeloma not having achieved remission: Secondary | ICD-10-CM

## 2012-02-09 MED ORDER — LENALIDOMIDE 10 MG PO CAPS: 10.0000 mg | ORAL_CAPSULE | Freq: Every day | ORAL | Status: DC

## 2012-02-09 NOTE — Telephone Encounter (Signed)
THIS REQUEST WAS PLACED IN DR.GRANFORTUNA'S ACTIVE WORK IN BOX. 

## 2012-02-12 ENCOUNTER — Other Ambulatory Visit: Payer: Self-pay | Admitting: Neurology

## 2012-02-12 ENCOUNTER — Telehealth: Payer: Self-pay | Admitting: *Deleted

## 2012-02-12 ENCOUNTER — Ambulatory Visit
Admission: RE | Admit: 2012-02-12 | Discharge: 2012-02-12 | Disposition: A | Discharge status: Home / Self Care | Payer: Medicare Other | Source: Ambulatory Visit | Attending: Neurology | Admitting: Neurology

## 2012-02-12 ENCOUNTER — Other Ambulatory Visit: Payer: Self-pay | Admitting: Oncology

## 2012-02-12 ENCOUNTER — Other Ambulatory Visit: Payer: Medicare Other | Admitting: Lab

## 2012-02-12 ENCOUNTER — Ambulatory Visit (HOSPITAL_BASED_OUTPATIENT_CLINIC_OR_DEPARTMENT_OTHER): Payer: Medicare Other | Admitting: Nurse Practitioner

## 2012-02-12 DIAGNOSIS — E119 Type 2 diabetes mellitus without complications: Secondary | ICD-10-CM

## 2012-02-12 DIAGNOSIS — G20A1 Parkinson's disease without dyskinesia, without mention of fluctuations: Secondary | ICD-10-CM

## 2012-02-12 DIAGNOSIS — E785 Hyperlipidemia, unspecified: Secondary | ICD-10-CM

## 2012-02-12 DIAGNOSIS — C9000 Multiple myeloma not having achieved remission: Secondary | ICD-10-CM

## 2012-02-12 DIAGNOSIS — C9002 Multiple myeloma in relapse: Secondary | ICD-10-CM

## 2012-02-12 DIAGNOSIS — G20 Parkinson's disease: Secondary | ICD-10-CM

## 2012-02-12 DIAGNOSIS — I82409 Acute embolism and thrombosis of unspecified deep veins of unspecified lower extremity: Secondary | ICD-10-CM

## 2012-02-12 DIAGNOSIS — I824Z9 Acute embolism and thrombosis of unspecified deep veins of unspecified distal lower extremity: Secondary | ICD-10-CM

## 2012-02-12 LAB — CBC WITH DIFFERENTIAL/PLATELET
Eosinophils Absolute: 0.1 10*3/uL (ref 0.0–0.5)
HCT: 37.1 % — ABNORMAL LOW (ref 38.4–49.9)
LYMPH%: 22.3 % (ref 14.0–49.0)
MONO#: 0.3 10*3/uL (ref 0.1–0.9)
NEUT#: 1.4 10*3/uL — ABNORMAL LOW (ref 1.5–6.5)
NEUT%: 60.6 % (ref 39.0–75.0)
Platelets: 76 10*3/uL — ABNORMAL LOW (ref 140–400)
RBC: 3.94 10*6/uL — ABNORMAL LOW (ref 4.20–5.82)
WBC: 2.3 10*3/uL — ABNORMAL LOW (ref 4.0–10.3)

## 2012-02-12 NOTE — Telephone Encounter (Signed)
Received vm call from [name] [name]/[employer] stating pt did phone survey & a restriction has been placed on his revlimid due to a response pt made.  Auth # [id number].  Returned call @ 0955am & [employer] rep reports that pt answered that he was not aware that revlimid can cause birth defects.  Assured rep that pt has had education & should know this but possibly answered wrong by mistake & will call pt & reiterate this education.  She will take off restriction.

## 2012-02-12 NOTE — Telephone Encounter (Signed)
appt made and [printed for 4/5

## 2012-02-12 NOTE — Progress Notes (Signed)
OFFICE PROGRESS NOTE  Interval history:  [name] is a 71 year old man with a lambda light chain myeloma. He is currently being treated with Revlimid 10 mg daily 21 days on followed by a 7 day break. He is maintained on Coumadin for a history of a right lower extremity deep vein thrombosis. He is seen today for scheduled followup.  [name] reports that his pain is better. He denies nausea/vomiting. He has a good appetite. He continues to have intermittent constipation. He takes MiraLAX as needed. He has stable numbness in his feet. He continues to note tremors involving the right arm/hand. His wife reports a recent evaluation by a neurologist and states that he may have Parkinson's. He was started on carbidopa levodopa.  [name] is scheduled to have multiple teeth extracted on 02/23/2012.   Objective: Blood pressure 146/67, pulse 57, temperature 96.7 F (35.9 C), temperature source Oral, height 5\' 10"  (1.778 m), weight 219 lb 14.4 oz (99.746 kg).  Oropharynx is without thrush or ulceration. No palpable cervical, supraclavicular or axillary lymph nodes. Lungs are clear. No wheezes or rales. Regular cardiac rhythm. Abdomen is soft and nontender. No organomegaly. Extremities are without edema. Motor strength 5 over 5. DTRs absent at the knees symmetrically.  Lab Results: Lab Results  Component Value Date   WBC 2.3* 02/12/2012   HGB 12.6* 02/12/2012   HCT 37.1* 02/12/2012   MCV 94.2 02/12/2012   PLT 76* 02/12/2012    Chemistry:    Chemistry      Component Value Date/Time   NA 140 12/26/2011 1151   NA 145 06/13/2009 1503   K 3.9 12/26/2011 1151   K 4.8* 06/13/2009 1503   CL 105 12/26/2011 1151   CL 108 06/13/2009 1503   CO2 25 12/26/2011 1151   CO2 29 06/13/2009 1503   BUN 18 12/26/2011 1151   BUN 49* 06/13/2009 1503   CREATININE 1.44* 12/26/2011 1151   CREATININE 1.41* 08/26/2011 1140   CREATININE 1.41* 08/26/2011 1140   CREATININE 1.41* 08/26/2011 1140   CREATININE 1.41* 08/26/2011 1140   CREATININE 1.41* 08/26/2011 1140   CREATININE 2.9* 06/13/2009 1503      Component Value Date/Time   CALCIUM 8.8 12/26/2011 1151   CALCIUM 10.5* 06/13/2009 1503   ALKPHOS 133* 12/26/2011 1151   ALKPHOS 135* 06/13/2009 1503   AST 15 12/26/2011 1151   AST 21 06/13/2009 1503   ALT 20 12/26/2011 1151   BILITOT 0.7 12/26/2011 1151   BILITOT 0.60 06/13/2009 1503       Studies/Results: Mr Brain Wo Contrast  02/12/2012  This examination was performed at [name] at [address] [facility]. The interpretation will be provided by [facility].  Original Report Authenticated By: Harley Hallmark, M.D.    Medications: I have reviewed the patient's current medications.  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 He was found to have multiple lytic bone lesions. Serum immunoglobulins were all suppressed. He had monoclonal lambda light chains on immunofixation electrophoresis. There was marked elevation of serum free lambda light chains at 3.5 g. Initial urine showed 6.7 g of protein, primarily free lambda light chains. Bone marrow biopsy showed 87% plasma cells. He was induced into a remission with a combination of Velcade plus dexamethasone with a subsequent addition of Revlimid. Velcade was discontinued due to progressive neuropathy. He would on to receive IV melphalan with autologous bone marrow support at [name] on 03/12/2010. He remained  off of treatment until a small amount of monoclonal lambda free light chain was again found in the urine in March 2012. He was started on Revlimid 10 mg daily beginning 04/09/2011. The Revlimid schedule was changed to 21 days on followed by a 7 day break at the same dose of 10 mg in August 2012 due to myelosuppression..  2. Type 2 diabetes:  He continues metformin. 3. Right lower extremity deep vein thrombosis 05/14/2011:  He continues full-dose Coumadin anticoagulation. 4. 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  He continues Lyrica. 5. Essential hypertension.  Controlled on current medication. 6. Question recent diagnosis of Parkinson's. He recently began carbidopa levodopa. 7. Dental-he is scheduled for multiple tooth extractions 02/23/2012.  Disposition-Mr. Luce will continue Revlimid at the current dose/schedule. We will followup on the myeloma labs from today. He is scheduled to have multiple teeth extracted on 02/23/2012. He will have a CBC and PT/INR checked in our office on 02/20/2012. We will forward a copy to his dentist. He will hold his dose of Coumadin 02/21/2012 and 02/22/2012 with plans to resume Coumadin the night of surgery. Mr. Raczkowski will return for a followup visit with Dr. Cyndie Chime on 03/26/2012. He will contact the office the interim with any problems.  Plan per Dr. Cyndie Chime.  Lonna Cobb ANP/GNP-BC

## 2012-02-13 ENCOUNTER — Other Ambulatory Visit: Payer: Self-pay | Admitting: *Deleted

## 2012-02-13 DIAGNOSIS — C9000 Multiple myeloma not having achieved remission: Secondary | ICD-10-CM

## 2012-02-13 MED ORDER — LENALIDOMIDE 5 MG PO CAPS: ORAL_CAPSULE | ORAL | Status: DC

## 2012-02-13 NOTE — Telephone Encounter (Signed)
New script given to RN last hs with dose change.  Pt's wife notified to stop revlimid for 7days & cont with plan for cbc & PT/INR 02/20/12 & will resume revlimid at lower dose.  She expressed understanding.  [facility] notified to cancel revlimid 10mg  dose & delivery.  New script faxed to them & called [employer] with new script for 5mg  dose & confirmed that same auth # from previous 10mg  script that was not filled could be used with this new script. & [facility] was notified of this also.

## 2012-02-16 ENCOUNTER — Other Ambulatory Visit: Payer: Self-pay | Admitting: *Deleted

## 2012-02-16 ENCOUNTER — Ambulatory Visit: Payer: Medicare Other | Admitting: Nurse Practitioner

## 2012-02-16 LAB — COMPREHENSIVE METABOLIC PANEL
CO2: 27 mEq/L (ref 19–32)
Calcium: 8.4 mg/dL (ref 8.4–10.5)
Glucose, Bld: 236 mg/dL — ABNORMAL HIGH (ref 70–99)
Sodium: 142 mEq/L (ref 135–145)
Total Bilirubin: 0.6 mg/dL (ref 0.3–1.2)
Total Protein: 5.9 g/dL — ABNORMAL LOW (ref 6.0–8.3)

## 2012-02-16 LAB — IMMUNOFIXATION ELECTROPHORESIS
IgG (Immunoglobin G), Serum: 818 mg/dL (ref 650–1600)
Total Protein, Serum Electrophoresis: 5.9 g/dL — ABNORMAL LOW (ref 6.0–8.3)

## 2012-02-16 NOTE — Telephone Encounter (Signed)
Spoke with Karel Jarvis, Occupational psychologist at [employer], and was informed that pt did not need prior authorization for Revlimid 5 mg.   Karel Jarvis stated she is waiting for the pharmacy to override cost from pt's insurance so a shipment can be sent out to pt as soon as possible. [employer]   Phone     [phone].

## 2012-02-17 ENCOUNTER — Ambulatory Visit: Payer: Medicare Other

## 2012-02-19 ENCOUNTER — Other Ambulatory Visit: Payer: Self-pay | Admitting: Pharmacist

## 2012-02-19 DIAGNOSIS — I82409 Acute embolism and thrombosis of unspecified deep veins of unspecified lower extremity: Secondary | ICD-10-CM

## 2012-02-19 LAB — UIFE/LIGHT CHAINS/TP QN, 24-HR UR
Albumin, U: DETECTED
Beta, Urine: DETECTED — AB
Free Kappa/Lambda Ratio: 5.31 ratio (ref 2.04–10.37)
Free Lambda Excretion/Day: 96.75 mg/d
Free Lambda Lt Chains,Ur: 4.5 mg/dL — ABNORMAL HIGH (ref 0.02–0.67)
Time: 24 hours
Total Protein, Urine-Ur/day: 632 mg/d — ABNORMAL HIGH (ref 10–140)
Total Protein, Urine: 29.4 mg/dL
Volume, Urine: 2150 mL

## 2012-02-20 ENCOUNTER — Ambulatory Visit (HOSPITAL_BASED_OUTPATIENT_CLINIC_OR_DEPARTMENT_OTHER): Payer: Medicare Other | Admitting: Pharmacist

## 2012-02-20 ENCOUNTER — Other Ambulatory Visit: Payer: Self-pay | Admitting: *Deleted

## 2012-02-20 ENCOUNTER — Other Ambulatory Visit: Payer: Medicare Other | Admitting: Lab

## 2012-02-20 DIAGNOSIS — C9002 Multiple myeloma in relapse: Secondary | ICD-10-CM

## 2012-02-20 DIAGNOSIS — I82409 Acute embolism and thrombosis of unspecified deep veins of unspecified lower extremity: Secondary | ICD-10-CM

## 2012-02-20 DIAGNOSIS — I824Z9 Acute embolism and thrombosis of unspecified deep veins of unspecified distal lower extremity: Secondary | ICD-10-CM

## 2012-02-20 LAB — CBC WITH DIFFERENTIAL/PLATELET
BASO%: 1.1 % (ref 0.0–2.0)
Eosinophils Absolute: 0 10*3/uL (ref 0.0–0.5)
LYMPH%: 20.4 % (ref 14.0–49.0)
MCHC: 34.1 g/dL (ref 32.0–36.0)
MONO#: 0.3 10*3/uL (ref 0.1–0.9)
MONO%: 10.9 % (ref 0.0–14.0)
NEUT#: 1.9 10*3/uL (ref 1.5–6.5)
Platelets: 95 10*3/uL — ABNORMAL LOW (ref 140–400)
RBC: 4.38 10*6/uL (ref 4.20–5.82)
RDW: 15.1 % — ABNORMAL HIGH (ref 11.0–14.6)
WBC: 2.9 10*3/uL — ABNORMAL LOW (ref 4.0–10.3)
nRBC: 0 % (ref 0–0)

## 2012-02-20 LAB — POCT INR: INR: 3.1

## 2012-02-20 LAB — PROTIME-INR: Protime: 37.2 Seconds — ABNORMAL HIGH (ref 10.6–13.4)

## 2012-02-20 NOTE — Telephone Encounter (Signed)
PT.'S REVLIMID WAS SHIPPED ON 02/17/12.

## 2012-02-20 NOTE — Progress Notes (Signed)
INR = 3.1 today on 2.5 mg MWF; 5 mg other days. Extraction of 5 teeth scheduled for 02/23/12.  Will be holding Coumadin 3/2 & 3/3. I will have pt continue maintenance dose of Coumadin starting back on 02/23/12. Instructed pt & his wife to call our pharmacy if abx are started by his DDS. I provided a copy of CBC for Mrs. Ladona Ridgel to inform the DDS of the Pltc today (at DDS request). Return in ~3 weeks for protime.  At that point if INR remains at goal, we can see him every 4-6 weeks. Marily Lente, Pharm.D.

## 2012-03-08 ENCOUNTER — Other Ambulatory Visit: Payer: Self-pay

## 2012-03-08 ENCOUNTER — Encounter (HOSPITAL_COMMUNITY): Payer: Self-pay | Admitting: Emergency Medicine

## 2012-03-08 ENCOUNTER — Emergency Department (HOSPITAL_COMMUNITY): Payer: Medicare Other

## 2012-03-08 ENCOUNTER — Inpatient Hospital Stay (HOSPITAL_COMMUNITY)
Admission: EM | Admit: 2012-03-08 | Discharge: 2012-03-10 | DRG: 194 | Disposition: A | Discharge status: Home Health | Payer: Medicare Other | Attending: Internal Medicine | Admitting: Internal Medicine

## 2012-03-08 DIAGNOSIS — G20 Parkinson's disease: Secondary | ICD-10-CM | POA: Diagnosis present

## 2012-03-08 DIAGNOSIS — I10 Essential (primary) hypertension: Secondary | ICD-10-CM | POA: Diagnosis present

## 2012-03-08 DIAGNOSIS — E785 Hyperlipidemia, unspecified: Secondary | ICD-10-CM | POA: Diagnosis present

## 2012-03-08 DIAGNOSIS — G20A1 Parkinson's disease without dyskinesia, without mention of fluctuations: Secondary | ICD-10-CM | POA: Diagnosis present

## 2012-03-08 DIAGNOSIS — C9000 Multiple myeloma not having achieved remission: Secondary | ICD-10-CM | POA: Diagnosis present

## 2012-03-08 DIAGNOSIS — I129 Hypertensive chronic kidney disease with stage 1 through stage 4 chronic kidney disease, or unspecified chronic kidney disease: Secondary | ICD-10-CM | POA: Diagnosis present

## 2012-03-08 DIAGNOSIS — D72819 Decreased white blood cell count, unspecified: Secondary | ICD-10-CM | POA: Diagnosis present

## 2012-03-08 DIAGNOSIS — C9002 Multiple myeloma in relapse: Secondary | ICD-10-CM | POA: Diagnosis present

## 2012-03-08 DIAGNOSIS — E119 Type 2 diabetes mellitus without complications: Secondary | ICD-10-CM | POA: Diagnosis present

## 2012-03-08 DIAGNOSIS — N289 Disorder of kidney and ureter, unspecified: Secondary | ICD-10-CM | POA: Diagnosis present

## 2012-03-08 DIAGNOSIS — J189 Pneumonia, unspecified organism: Principal | ICD-10-CM | POA: Diagnosis present

## 2012-03-08 DIAGNOSIS — N189 Chronic kidney disease, unspecified: Secondary | ICD-10-CM | POA: Diagnosis present

## 2012-03-08 DIAGNOSIS — E876 Hypokalemia: Secondary | ICD-10-CM | POA: Diagnosis present

## 2012-03-08 DIAGNOSIS — I82409 Acute embolism and thrombosis of unspecified deep veins of unspecified lower extremity: Secondary | ICD-10-CM | POA: Diagnosis present

## 2012-03-08 DIAGNOSIS — G629 Polyneuropathy, unspecified: Secondary | ICD-10-CM

## 2012-03-08 DIAGNOSIS — K219 Gastro-esophageal reflux disease without esophagitis: Secondary | ICD-10-CM | POA: Diagnosis present

## 2012-03-08 LAB — URINALYSIS, ROUTINE W REFLEX MICROSCOPIC
Bilirubin Urine: NEGATIVE
Leukocytes, UA: NEGATIVE
Nitrite: NEGATIVE
Specific Gravity, Urine: 1.022 (ref 1.005–1.030)
Urobilinogen, UA: 1 mg/dL (ref 0.0–1.0)

## 2012-03-08 LAB — COMPREHENSIVE METABOLIC PANEL
AST: 16 U/L (ref 0–37)
Albumin: 3.6 g/dL (ref 3.5–5.2)
Alkaline Phosphatase: 96 U/L (ref 39–117)
CO2: 24 mEq/L (ref 19–32)
Chloride: 101 mEq/L (ref 96–112)
GFR calc non Af Amer: 44 mL/min — ABNORMAL LOW (ref >90)
Potassium: 3.4 mEq/L — ABNORMAL LOW (ref 3.5–5.1)
Total Bilirubin: 0.6 mg/dL (ref 0.3–1.2)

## 2012-03-08 LAB — URINE MICROSCOPIC-ADD ON

## 2012-03-08 LAB — DIFFERENTIAL
Basophils Absolute: 0 10*3/uL (ref 0.0–0.1)
Eosinophils Relative: 1 % (ref 0–5)
Lymphocytes Relative: 14 % (ref 12–46)
Monocytes Relative: 11 % (ref 3–12)
Neutro Abs: 2.3 10*3/uL (ref 1.7–7.7)

## 2012-03-08 LAB — GLUCOSE, CAPILLARY
Glucose-Capillary: 135 mg/dL — ABNORMAL HIGH (ref 70–99)
Glucose-Capillary: 138 mg/dL — ABNORMAL HIGH (ref 70–99)
Glucose-Capillary: 157 mg/dL — ABNORMAL HIGH (ref 70–99)

## 2012-03-08 LAB — CBC
HCT: 36.1 % — ABNORMAL LOW (ref 39.0–52.0)
MCV: 91.2 fL (ref 78.0–100.0)
Platelets: 85 10*3/uL — ABNORMAL LOW (ref 150–400)
RBC: 3.96 MIL/uL — ABNORMAL LOW (ref 4.22–5.81)
RDW: 14.9 % (ref 11.5–15.5)
WBC: 3 10*3/uL — ABNORMAL LOW (ref 4.0–10.5)

## 2012-03-08 LAB — LACTIC ACID, PLASMA: Lactic Acid, Venous: 2.1 mmol/L (ref 0.5–2.2)

## 2012-03-08 LAB — PROCALCITONIN: Procalcitonin: 0.1 ng/mL

## 2012-03-08 LAB — ABO/RH: ABO/RH(D): B POS

## 2012-03-08 LAB — TYPE AND SCREEN

## 2012-03-08 MED ORDER — LENALIDOMIDE 5 MG PO CAPS
5.0000 mg | ORAL_CAPSULE | Freq: Every day | ORAL | Status: DC
  Administered 2012-03-08 – 2012-03-10 (×3): 5 mg via ORAL

## 2012-03-08 MED ORDER — INSULIN ASPART 100 UNIT/ML ~~LOC~~ SOLN: 0.0000 [IU] | Freq: Every day | SUBCUTANEOUS | Status: DC

## 2012-03-08 MED ORDER — SODIUM CHLORIDE 0.9 % IV BOLUS (SEPSIS)
1000.0000 mL | Freq: Once | INTRAVENOUS | Status: AC
  Administered 2012-03-08: 1000 mL via INTRAVENOUS

## 2012-03-08 MED ORDER — WARFARIN - PHARMACIST DOSING INPATIENT: Freq: Every day | Status: DC

## 2012-03-08 MED ORDER — DEXTROSE 5 % IV SOLN
1.0000 g | Freq: Once | INTRAVENOUS | Status: AC
  Administered 2012-03-08: 1 g via INTRAVENOUS
  Filled 2012-03-08: qty 10

## 2012-03-08 MED ORDER — WARFARIN SODIUM 2.5 MG PO TABS
2.5000 mg | ORAL_TABLET | Freq: Once | ORAL | Status: AC
  Administered 2012-03-08: 2.5 mg via ORAL
  Filled 2012-03-08: qty 1

## 2012-03-08 MED ORDER — IRBESARTAN 75 MG PO TABS
75.0000 mg | ORAL_TABLET | Freq: Every day | ORAL | Status: DC
  Administered 2012-03-09 – 2012-03-10 (×2): 75 mg via ORAL
  Filled 2012-03-08 (×3): qty 1

## 2012-03-08 MED ORDER — POLYETHYLENE GLYCOL 3350 17 G PO PACK
17.0000 g | PACK | Freq: Every day | ORAL | Status: DC | PRN
  Filled 2012-03-08: qty 1

## 2012-03-08 MED ORDER — PREDNISONE 20 MG PO TABS
40.0000 mg | ORAL_TABLET | Freq: Every day | ORAL | Status: DC
  Administered 2012-03-08 – 2012-03-09 (×2): 40 mg via ORAL
  Filled 2012-03-08 (×2): qty 2

## 2012-03-08 MED ORDER — ACETAMINOPHEN 650 MG RE SUPP: 650.0000 mg | Freq: Four times a day (QID) | RECTAL | Status: DC | PRN

## 2012-03-08 MED ORDER — PREGABALIN 100 MG PO CAPS
100.0000 mg | ORAL_CAPSULE | Freq: Every day | ORAL | Status: DC
  Administered 2012-03-09 – 2012-03-10 (×2): 100 mg via ORAL
  Filled 2012-03-08 (×2): qty 1

## 2012-03-08 MED ORDER — DEXTROSE 5 % IV SOLN
1.0000 g | INTRAVENOUS | Status: DC
  Administered 2012-03-09 – 2012-03-10 (×2): 1 g via INTRAVENOUS
  Filled 2012-03-08 (×3): qty 10

## 2012-03-08 MED ORDER — PANTOPRAZOLE SODIUM 40 MG PO TBEC
40.0000 mg | DELAYED_RELEASE_TABLET | Freq: Every day | ORAL | Status: DC
  Administered 2012-03-08 – 2012-03-10 (×3): 40 mg via ORAL
  Filled 2012-03-08 (×4): qty 1

## 2012-03-08 MED ORDER — SODIUM CHLORIDE 0.9 % IJ SOLN
3.0000 mL | Freq: Two times a day (BID) | INTRAMUSCULAR | Status: DC
  Administered 2012-03-09: 3 mL via INTRAVENOUS

## 2012-03-08 MED ORDER — LENALIDOMIDE 5 MG PO CAPS: 5.0000 mg | ORAL_CAPSULE | Freq: Every day | ORAL | Status: DC

## 2012-03-08 MED ORDER — ONDANSETRON HCL 4 MG/2ML IJ SOLN: 4.0000 mg | Freq: Three times a day (TID) | INTRAMUSCULAR | Status: DC | PRN

## 2012-03-08 MED ORDER — ONDANSETRON HCL 4 MG PO TABS: 4.0000 mg | ORAL_TABLET | Freq: Four times a day (QID) | ORAL | Status: DC | PRN

## 2012-03-08 MED ORDER — ACETAMINOPHEN 325 MG PO TABS
650.0000 mg | ORAL_TABLET | Freq: Once | ORAL | Status: AC
  Administered 2012-03-08: 650 mg via ORAL
  Filled 2012-03-08: qty 2

## 2012-03-08 MED ORDER — DEXTROSE 5 % IV SOLN
500.0000 mg | INTRAVENOUS | Status: DC
  Administered 2012-03-09 – 2012-03-10 (×2): 500 mg via INTRAVENOUS
  Filled 2012-03-08 (×3): qty 500

## 2012-03-08 MED ORDER — ALBUTEROL SULFATE (5 MG/ML) 0.5% IN NEBU: 2.5000 mg | INHALATION_SOLUTION | RESPIRATORY_TRACT | Status: DC | PRN

## 2012-03-08 MED ORDER — ASPIRIN EC 81 MG PO TBEC
81.0000 mg | DELAYED_RELEASE_TABLET | Freq: Every day | ORAL | Status: DC
  Administered 2012-03-09 – 2012-03-10 (×2): 81 mg via ORAL
  Filled 2012-03-08 (×3): qty 1

## 2012-03-08 MED ORDER — ONDANSETRON HCL 4 MG/2ML IJ SOLN: 4.0000 mg | Freq: Four times a day (QID) | INTRAMUSCULAR | Status: DC | PRN

## 2012-03-08 MED ORDER — POTASSIUM CHLORIDE CRYS ER 20 MEQ PO TBCR
40.0000 meq | EXTENDED_RELEASE_TABLET | Freq: Two times a day (BID) | ORAL | Status: AC
  Administered 2012-03-08 (×2): 40 meq via ORAL
  Filled 2012-03-08: qty 2
  Filled 2012-03-08: qty 1

## 2012-03-08 MED ORDER — DEXTROSE 5 % IV SOLN
500.0000 mg | Freq: Once | INTRAVENOUS | Status: AC
  Administered 2012-03-08: 500 mg via INTRAVENOUS
  Filled 2012-03-08: qty 500

## 2012-03-08 MED ORDER — DOCUSATE SODIUM 100 MG PO CAPS
100.0000 mg | ORAL_CAPSULE | Freq: Two times a day (BID) | ORAL | Status: DC
  Administered 2012-03-08 – 2012-03-10 (×4): 100 mg via ORAL
  Filled 2012-03-08 (×7): qty 1

## 2012-03-08 MED ORDER — ACETAMINOPHEN 325 MG PO TABS: 650.0000 mg | ORAL_TABLET | Freq: Four times a day (QID) | ORAL | Status: DC | PRN

## 2012-03-08 MED ORDER — ATORVASTATIN CALCIUM 10 MG PO TABS
10.0000 mg | ORAL_TABLET | Freq: Every day | ORAL | Status: DC
  Administered 2012-03-08 – 2012-03-09 (×2): 10 mg via ORAL
  Filled 2012-03-08 (×4): qty 1

## 2012-03-08 MED ORDER — ALBUTEROL SULFATE (5 MG/ML) 0.5% IN NEBU
INHALATION_SOLUTION | RESPIRATORY_TRACT | Status: AC
  Administered 2012-03-08: 5 mg
  Filled 2012-03-08: qty 0.5

## 2012-03-08 MED ORDER — CARBIDOPA-LEVODOPA 25-100 MG PO TABS
1.0000 | ORAL_TABLET | Freq: Three times a day (TID) | ORAL | Status: DC
  Administered 2012-03-08 – 2012-03-10 (×7): 1 via ORAL
  Filled 2012-03-08 (×11): qty 1

## 2012-03-08 MED ORDER — SODIUM CHLORIDE 0.9 % IV SOLN
INTRAVENOUS | Status: DC
  Administered 2012-03-08 – 2012-03-09 (×4): via INTRAVENOUS
  Filled 2012-03-08: qty 1000

## 2012-03-08 MED ORDER — INSULIN ASPART 100 UNIT/ML ~~LOC~~ SOLN
0.0000 [IU] | Freq: Three times a day (TID) | SUBCUTANEOUS | Status: DC
  Administered 2012-03-09 (×3): 2 [IU] via SUBCUTANEOUS
  Administered 2012-03-10: 1 [IU] via SUBCUTANEOUS
  Administered 2012-03-10: 2 [IU] via SUBCUTANEOUS

## 2012-03-08 MED ORDER — GUAIFENESIN ER 600 MG PO TB12
600.0000 mg | ORAL_TABLET | Freq: Two times a day (BID) | ORAL | Status: DC
  Administered 2012-03-08 – 2012-03-10 (×4): 600 mg via ORAL
  Filled 2012-03-08 (×7): qty 1

## 2012-03-08 MED ORDER — ALBUTEROL SULFATE (5 MG/ML) 0.5% IN NEBU
INHALATION_SOLUTION | RESPIRATORY_TRACT | Status: AC
  Administered 2012-03-08: 2.5 mg
  Filled 2012-03-08: qty 0.5

## 2012-03-08 NOTE — ED Notes (Signed)
Second documentation of Albuterol 2.5 is a duplicate.

## 2012-03-08 NOTE — H&P (Signed)
Patient ID: [name] [name] MRN: [id number] DOB/AGE: 72-Aug-1942 72 y.o.   PCP: Katy Apo, MD, MD Oncology:  Dr. Cyndie Chime  DATE OF ADMISSION: 03/08/2012  CHIEF COMPLAINT: Chief Complaint  Patient presents with  . Cough    with wheezig and congestion    HPI: [name] [name] is a 71yo AAM with pmh of multiple myeloma, DM2, htn and chronic renal insufficiency who presents to ED with increasing weakness and cough. Wife states symptoms have been ongoing for approximately 3 days but progressively worse. Pt unable to get out of bed since Sunday due to significant weakness. Pt also reports cough productive of green sputum. He denies any sob or subjective fever. He denies any headache, dizziness, chest pain, abdominal pain, n/v/d. He does report some constipation and some dysuria. He also report some mid/low back pain that is not a new issue for him. This pain is relieved with tylenol.  At baseline, pt is independent of all ADLs and ambulatory without assistance.   PMH: Past Medical History  Diagnosis Date  . Diabetes mellitus   . GERD (gastroesophageal reflux disease)   . Anemia   . Hypertension   . Prostatic hyperplasia   . Arthritis   . Multiple myeloma in relapse 12/24/2011  . Benign essential HTN 12/24/2011  . CRI (chronic renal insufficiency) 12/26/2011  . 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 DVT of lower extremity (deep venous thrombosis) 05/14/2011  . Hyperlipidemia, mixed 12/26/2011  . Herpes zoster infection 03/11/2010  . DM II (diabetes mellitus, type II), controlled 12/26/2011    History reviewed. No pertinent past surgical history.  HOME MEDS: Medication List  As of 03/08/2012 11:20 AM   ASK your doctor about these medications         acetaminophen 500 MG tablet   Commonly known as: TYLENOL      aspirin EC 81 MG tablet      atorvastatin 10 MG tablet   Commonly known as: LIPITOR      carbidopa-levodopa 25-100 MG per tablet   Commonly known as:  SINEMET      lenalidomide 5 MG capsule   Commonly known as: REVLIMID      metFORMIN 500 MG tablet   Commonly known as: GLUCOPHAGE      pantoprazole 40 MG tablet   Commonly known as: PROTONIX      polyethylene glycol packet   Commonly known as: MIRALAX / GLYCOLAX      pregabalin 100 MG capsule   Commonly known as: LYRICA   Take 1 capsule (100 mg total) by mouth daily.      valsartan 80 MG tablet   Commonly known as: DIOVAN      warfarin 5 MG tablet   Commonly known as: COUMADIN             ALLERGIES: No Known Allergies  SOCIAL HX: Married. Retired. NS/ND. 2 adult children live out of state.  FAMILY HX: reviewed and noncontributory to admit.  ROS: As stated in hpi, otherwise negative.   PHYSICAL EXAM:  Vital Signs: Temp:  [99.3 F (37.4 C)-101.8 F (38.8 C)] 99.3 F (37.4 C) (03/18 0608) Pulse Rate:  [70-116] 70  (03/18 0608) Resp:  [20-23] 20  (03/18 0608) BP: (110-160)/(59-92) 110/59 mmHg (03/18 0608) SpO2:  [95 %-100 %] 95 % (03/18 0608)   General: awake, alert, weak appearing but in NAD Head: normocephalic, atraumatic EENT: mucous membranes slightly dry, no OP lesions Neck: supple, no TM or lymphadenopathy, no JVD or  carotid bruits Chest: symmetrical movement, NT to palpation CV: S1S2 RRR, no m/r/g Resp: diminished LLL, very mild expiratory wheeze on left side. Good air mvmt. No increased wob GI: abdomen slightly distended but soft, NT, BS+ MSK: no obvious deformities Skin: no suspicious rashes or lesions Neuro: profound, generalized weakness but MAE x 4 without focal m/s deficit on exam Psych: AOx4, normal mood and affect  LABS/STUDIES: Lab Results  Component Value Date   WBC 3.0* 03/08/2012   HGB 12.2* 03/08/2012   HCT 36.1* 03/08/2012   MCV 91.2 03/08/2012   PLT 85* 03/08/2012   CMP     Component Value Date/Time   NA 138 03/08/2012 0336   NA 145 06/13/2009 1503   K 3.4* 03/08/2012 0336   K 4.8* 06/13/2009 1503   CL 101 03/08/2012 0336   CL  108 06/13/2009 1503   CO2 24 03/08/2012 0336   CO2 29 06/13/2009 1503   GLUCOSE 148* 03/08/2012 0336   GLUCOSE 114 06/13/2009 1503   BUN 17 03/08/2012 0336   BUN 49* 06/13/2009 1503   CREATININE 1.51* 03/08/2012 0336   CREATININE 1.41* 08/26/2011 1140   CREATININE 1.41* 08/26/2011 1140   CREATININE 1.41* 08/26/2011 1140   CREATININE 1.41* 08/26/2011 1140   CREATININE 1.41* 08/26/2011 1140   CREATININE 2.9* 06/13/2009 1503   CALCIUM 8.2* 03/08/2012 0336   CALCIUM 10.5* 06/13/2009 1503   PROT 6.3 03/08/2012 0336   PROT 6.4 06/13/2009 1503   ALBUMIN 3.6 03/08/2012 0336   AST 16 03/08/2012 0336   AST 21 06/13/2009 1503   ALT 11 03/08/2012 0336   ALKPHOS 96 03/08/2012 0336   ALKPHOS 135* 06/13/2009 1503   BILITOT 0.6 03/08/2012 0336   BILITOT 0.60 06/13/2009 1503   GFRNONAA 44* 03/08/2012 0336   GFRAA 51* 03/08/2012 0336    INR/Prothrombin Time 2.20/24.8   Current Facility-Administered Medications  Medication Dose Route Frequency Provider Last Rate Last Dose  . acetaminophen (TYLENOL) tablet 650 mg  650 mg Oral Once Cyndra Numbers, MD   650 mg at 03/08/12 0309  . azithromycin (ZITHROMAX) 500 mg in dextrose 5 % 250 mL IVPB  500 mg Intravenous Once Cyndra Numbers, MD   500 mg at 03/08/12 0438  . cefTRIAXone (ROCEPHIN) 1 g in dextrose 5 % 50 mL IVPB  1 g Intravenous Once Cyndra Numbers, MD   1 g at 03/08/12 0409  . ondansetron (ZOFRAN) injection 4 mg  4 mg Intravenous Q8H PRN Meagan Hunt, MD      . potassium chloride SA (K-DUR,KLOR-CON) CR tablet 40 mEq  40 mEq Oral BID Cordelia Pen, NP   40 mEq at 03/08/12 0901  . sodium chloride 0.9 % bolus 1,000 mL  1,000 mL Intravenous Once Cyndra Numbers, MD   1,000 mL at 03/08/12 0335   Current Outpatient Prescriptions  Medication Sig Dispense Refill  . acetaminophen (TYLENOL) 500 MG tablet Take 500 mg by mouth every 6 (six) hours as needed.       Marland Kitchen aspirin EC 81 MG tablet Take 81 mg by mouth daily.      Marland Kitchen atorvastatin (LIPITOR) 10 MG tablet Take 10 mg by mouth at bedtime.         . carbidopa-levodopa (SINEMET) 25-100 MG per tablet Take 1 tablet by mouth 3 (three) times daily.      Marland Kitchen lenalidomide (REVLIMID) 5 MG capsule Take 5 mg by mouth See admin instructions. 1 capsule po daily x 21days, then 7 day break.  To start after 7 day  break & lab check 02/20/12 Auth # [id number] same as previous 10mg  dose that was cancelled with [facility]--verified with [employer] Script faxed to [facility] @@ [phone]      . metFORMIN (GLUCOPHAGE) 500 MG tablet Take 500 mg by mouth daily with breakfast.        . pantoprazole (PROTONIX) 40 MG tablet Take 40 mg by mouth daily as needed. For acid reflux      . polyethylene glycol (MIRALAX / GLYCOLAX) packet Take 17 g by mouth daily as needed.        . pregabalin (LYRICA) 100 MG capsule Take 1 capsule (100 mg total) by mouth daily.  90 capsule  1  . valsartan (DIOVAN) 80 MG tablet Take 80 mg by mouth daily.        Marland Kitchen warfarin (COUMADIN) 5 MG tablet Take 2.5-5 mg by mouth See admin instructions. Takes 2.5mg  on Monday, Wednesday, and Friday. 5 mg is taken on Tuesday, Thursday, Saturday, and Sunday.        ASSESSMENT & PLAN Patient Active Problem List  Diagnoses  . DVT (deep venous thrombosis)  . Multiple myeloma in relapse  . Benign essential HTN  . GERD (gastroesophageal reflux disease)  . CRI (chronic renal insufficiency)  . DJD (degenerative joint disease) of lumbar spine  . DVT of lower extremity (deep venous thrombosis)  . Hyperlipidemia, mixed  . Herpes zoster infection  . DM II (diabetes mellitus, type II), controlled  . Community acquired pneumonia    1. CAP: Admit. Continue Azithromycin and Rocephin started in the ED. Will check sputum culture. Blood culture sent on admit. Prn nebulizers, gentle IVF hydration. VSS, pt is non-toxic appearing. Will start prednisone to be tapered.  2. Multiple Myeloma: continue home meds.  3. Hypokalemia, mild: replete po  4. CRI: at baseline. Monitor  5. Hx htn: currently stable. Will continue  pt's home Diovan for now.   6. DM2: Will hold Metformin with current decreased po intake. SSI coverage while inpt.  7. Hyperlipidemia: on statin  8. GERD: on ppi   9. Hx DVT 2012 on chronic coumadin: INR therapeutic. Continue per pharmacy.  10. Prophylaxis: on anticoag  11. Dispo: profound weakness on admission exam that will hopefully improve with treatment of acute issures. Will ask PT/OT to evaluate for discharge planning.  Will notify pt's PCP of admit.  Pt is a full code.   Cordelia Pen, NP-C [facility]  pgr [phone]

## 2012-03-08 NOTE — ED Notes (Signed)
CBG 138

## 2012-03-08 NOTE — Progress Notes (Addendum)
ANTICOAGULATION CONSULT NOTE - Initial Consult  Pharmacy Consult for: Warfarin Indication: DVT  No Known Allergies  Patient Measurements: Height: 5\' 11"  (180.3 cm) Weight: 217 lb 6 oz (98.6 kg) IBW/kg (Calculated) : 75.3    Vital Signs: Temp: 99 F (37.2 C) (03/18 1700) Temp src: Oral (03/18 1305) BP: 154/74 mmHg (03/18 1700) Pulse Rate: 59  (03/18 1700)  Labs:  Basename 03/08/12 0336  HGB 12.2*  HCT 36.1*  PLT 85*  APTT --  LABPROT 24.8*  INR 2.20*  HEPARINUNFRC --  CREATININE 1.51*  CKTOTAL --  CKMB --  TROPONINI --   Estimated Creatinine Clearance: 52.9 ml/min (by C-G formula based on Cr of 1.51).  Medical History: Past Medical History  Diagnosis Date  . Diabetes mellitus   . GERD (gastroesophageal reflux disease)   . Anemia   . Hypertension   . Prostatic hyperplasia   . Arthritis   . Multiple myeloma in relapse 12/24/2011  . Benign essential HTN 12/24/2011  . CRI (chronic renal insufficiency) 12/26/2011  . 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 DVT of lower extremity (deep venous thrombosis) 05/14/2011  . Hyperlipidemia, mixed 12/26/2011  . Herpes zoster infection 03/11/2010  . DM II (diabetes mellitus, type II), controlled 12/26/2011    Medications:  Scheduled:    . acetaminophen  650 mg Oral Once  . albuterol      . albuterol      . aspirin EC  81 mg Oral Daily  . atorvastatin  10 mg Oral QHS  . azithromycin  500 mg Intravenous Once  . azithromycin  500 mg Intravenous Q24H  . carbidopa-levodopa  1 tablet Oral TID  . cefTRIAXone (ROCEPHIN)  IV  1 g Intravenous Once  . cefTRIAXone (ROCEPHIN)  IV  1 g Intravenous Q24H  . docusate sodium  100 mg Oral BID  . guaiFENesin  600 mg Oral BID  . insulin aspart  0-5 Units Subcutaneous QHS  . insulin aspart  0-9 Units Subcutaneous TID WC  . irbesartan  75 mg Oral Daily  . lenalidomide  5 mg Oral Daily  . pantoprazole  40 mg Oral Daily  . potassium chloride  40 mEq Oral BID  . predniSONE   40 mg Oral Q breakfast  . pregabalin  100 mg Oral Daily  . sodium chloride  1,000 mL Intravenous Once  . sodium chloride  3 mL Intravenous Q12H   Infusions:    . sodium chloride     PRN: acetaminophen, acetaminophen, albuterol, ondansetron (ZOFRAN) IV, ondansetron, polyethylene glycol, DISCONTD: ondansetron (ZOFRAN) IV  Assessment:  71 yo M with hx DVT in 2012 on chronic coumadin.   INR therapeutic today on admission  Home dose = 2.5 mg on MWF and 5 mg on Tues/Thurs/Sat/Sun  This evening the patient reported his last dose as yesterday  Goal of Therapy:   INR 2-3   Plan:  1.) Coumadin 2.5 mg tonight x 1 2.) Daily PT/INR  [name] [name], Loma Messing PharmD 6:34 PM 03/08/2012

## 2012-03-08 NOTE — ED Provider Notes (Signed)
History     CSN: [id number]  Arrival date & time 03/08/12  0035   First MD Initiated Contact with Patient 03/08/12 0216      Chief Complaint  Patient presents with  . Cough    with wheezig and congestion    (Consider location/radiation/quality/duration/timing/severity/associated sxs/prior treatment) HPI Patient is a 71 yo M who presents today with complaint of increasing cough over the past week as well as fever and increased weakness.  He denies chest pain except with cough.  Patient has history of DM as well as multiple myeloma and anemia.  He denies any changes in his fevers.  His wife reports that he is too weak to get up at home at this time.  Patient has weak non-productive cough.  He also complains of myalgias.  There are no other associated or modifying factors.  Past Medical History  Diagnosis Date  . Diabetes mellitus   . GERD (gastroesophageal reflux disease)   . Anemia   . Hypertension   . Prostatic hyperplasia   . Arthritis   . Multiple myeloma in relapse 12/24/2011  . Benign essential HTN 12/24/2011  . CRI (chronic renal insufficiency) 12/26/2011  . 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 DVT of lower extremity (deep venous thrombosis) 05/14/2011  . Hyperlipidemia, mixed 12/26/2011  . Herpes zoster infection 03/11/2010  . DM II (diabetes mellitus, type II), controlled 12/26/2011    History reviewed. No pertinent past surgical history.  History reviewed. No pertinent family history.  History  Substance Use Topics  . Smoking status: Not on file  . Smokeless tobacco: Not on file  . Alcohol Use:       Review of Systems  Constitutional: Positive for fever, chills, activity change and fatigue.  HENT: Positive for congestion.   Eyes: Negative.   Respiratory: Positive for cough and choking.   Cardiovascular: Negative.   Gastrointestinal: Negative.   Genitourinary: Negative.   Musculoskeletal: Positive for myalgias.  Skin: Negative.     Neurological: Negative.   Hematological: Negative.   Psychiatric/Behavioral: Negative.   All other systems reviewed and are negative.    Allergies  Review of patient's allergies indicates no known allergies.  Home Medications   Current Outpatient Rx  Name Route Sig Dispense Refill  . ACETAMINOPHEN 500 MG PO TABS Oral Take 500 mg by mouth every 6 (six) hours as needed.     . ASPIRIN EC 81 MG PO TBEC Oral Take 81 mg by mouth daily.    . ATORVASTATIN CALCIUM 10 MG PO TABS Oral Take 10 mg by mouth at bedtime.      Marland Kitchen CARBIDOPA-LEVODOPA 25-100 MG PO TABS Oral Take 1 tablet by mouth 3 (three) times daily.    Marland Kitchen LENALIDOMIDE 5 MG PO CAPS Oral Take 5 mg by mouth See admin instructions. 1 capsule po daily x 21days, then 7 day break.  To start after 7 day break & lab check 02/20/12 Auth # [id number] same as previous 10mg  dose that was cancelled with [facility]--verified with [employer] Script faxed to [facility] @@ [phone]    . METFORMIN HCL 500 MG PO TABS Oral Take 500 mg by mouth daily with breakfast.      . PANTOPRAZOLE SODIUM 40 MG PO TBEC Oral Take 40 mg by mouth daily as needed. For acid reflux    . POLYETHYLENE GLYCOL 3350 PO PACK Oral Take 17 g by mouth daily as needed.      Marland Kitchen PREGABALIN 100 MG PO  CAPS Oral Take 1 capsule (100 mg total) by mouth daily. 90 capsule 1  . VALSARTAN 80 MG PO TABS Oral Take 80 mg by mouth daily.      . WARFARIN SODIUM 5 MG PO TABS Oral Take 2.5-5 mg by mouth See admin instructions. Takes 2.5mg  on Monday, Wednesday, and Friday. 5 mg is taken on Tuesday, Thursday, Saturday, and Sunday.      BP 110/59  Pulse 70  Temp(Src) 99.3 F (37.4 C) (Oral)  Resp 20  SpO2 95%  Physical Exam  Nursing note and vitals reviewed. GEN: Well-developed, chronically-ill appearing male in no distress HEENT: Atraumatic, normocephalic. Oropharynx clear without erythema EYES: PERRLA BL, no scleral icterus. NECK: Trachea midline, no meningismus CV: regular rate and rhythm. No  murmurs, rubs, or gallops PULM: No respiratory distress.  No crackles, wheezes, or rales. GI: soft, non-tender. No guarding, rebound, or tenderness. + bowel sounds  GU: deferred Neuro: cranial nerves 2-12 intact, no abnormalities of strength or sensation, A and O x 3 MSK: Patient moves all 4 extremities symmetrically, no deformity, edema, or injury noted Skin: No rashes petechiae, purpura, or jaundice Psych: no abnormality of mood   ED Course  Procedures (including critical care time)  Labs Reviewed  CBC - Abnormal; Notable for the following:    WBC 3.0 (*)    RBC 3.96 (*)    Hemoglobin 12.2 (*)    HCT 36.1 (*)    Platelets 85 (*)    All other components within normal limits  DIFFERENTIAL - Abnormal; Notable for the following:    Lymphs Abs 0.4 (*)    All other components within normal limits  COMPREHENSIVE METABOLIC PANEL - Abnormal; Notable for the following:    Potassium 3.4 (*)    Glucose, Bld 148 (*)    Creatinine, Ser 1.51 (*)    Calcium 8.2 (*)    GFR calc non Af Amer 44 (*)    GFR calc Af Amer 51 (*)    All other components within normal limits  PROTIME-INR - Abnormal; Notable for the following:    Prothrombin Time 24.8 (*)    INR 2.20 (*)    All other components within normal limits  URINALYSIS, ROUTINE W REFLEX MICROSCOPIC - Abnormal; Notable for the following:    Hgb urine dipstick MODERATE (*)    Ketones, ur 15 (*)    Protein, ur 30 (*)    All other components within normal limits  URINE MICROSCOPIC-ADD ON - Abnormal; Notable for the following:    Bacteria, UA FEW (*)    All other components within normal limits  TYPE AND SCREEN  LACTIC ACID, PLASMA  ABO/RH  PROCALCITONIN  CULTURE, BLOOD (ROUTINE X 2)  CULTURE, BLOOD (ROUTINE X 2)  URINE CULTURE   Dg Chest 2 View  03/08/2012  *RADIOLOGY REPORT*  Clinical Data: Cough and wheezing.  CHEST - 2 VIEW  Comparison: Chest radiograph performed 05/28/2009  Findings: The lungs are well-aerated.  Apparent mild  left lower lobe opacity could reflect pneumonia.  Vascular congestion is noted, and mild interstitial edema could have a similar appearance. There is no evidence of pleural effusion or pneumothorax.  The heart is mildly enlarged.  No acute osseous abnormalities are seen.  IMPRESSION: Apparent mild left lower lobe airspace opacity could reflect pneumonia.  Vascular congestion and mild cardiomegaly; mild interstitial edema could have a similar appearance.  Original Report Authenticated By: Tonia Ghent, M.D.     1. Neuropathy   2. CAP (community  acquired pneumonia)       MDM  Patient presented with symptoms concerning for pneumonia including cough and fever.  Patient had abnormal CBC but this was at his baseline.  Fever improved but CXR did show a pneumonia.  As patient had not been hospitalized in the past 90 days this was CAP.  He was treated with Rocephin and Azithro.  Though d/c home was a consideration patient was unable to stand on his own and this was a change from baseline.  His wife was unable to care for him at home with this and the patient was admitted to the hospitalist for further management.        Cyndra Numbers, MD 03/10/12 2140

## 2012-03-08 NOTE — ED Notes (Signed)
Attempted to call report to floor. Nurse not available, will call back.

## 2012-03-08 NOTE — ED Notes (Signed)
Pt received to TCU room 27. Alert, oriented, skin w/d. No distress. Wife at bedside and is asking about routine meds including Ca meds.

## 2012-03-08 NOTE — ED Notes (Signed)
Family at bedside.Wife concerned pt is not getting his cancer med. Message sent to Dr. Elyse Jarvis.

## 2012-03-08 NOTE — ED Notes (Signed)
Several attempts to toilet the pt, un successful Rn notified

## 2012-03-08 NOTE — ED Notes (Addendum)
Per EMS: Pt comes from home with c/o cough and congestion that began Sunday morning. Pt states he is warm and doesn't feel good. Denies NVD. EMS reports wheezing in route. Pt was given 10 of albuterol via breathing tx and stated it helped.

## 2012-03-08 NOTE — ED Notes (Signed)
Transfer info. An error in documentation

## 2012-03-08 NOTE — ED Notes (Signed)
Given water as requested.

## 2012-03-08 NOTE — ED Notes (Signed)
WUJ:[id number]<JY> Expected date:03/08/12<BR> Expected time:12:28 AM<BR> Means of arrival:Ambulance<BR> Comments:<BR> EMS, cough congestion, 71 yo m

## 2012-03-09 LAB — CBC
HCT: 36.6 % — ABNORMAL LOW (ref 39.0–52.0)
HCT: 34.9 % — ABNORMAL LOW (ref 39.0–52.0)
MCH: 31 pg (ref 26.0–34.0)
MCHC: 33.1 g/dL (ref 30.0–36.0)
MCV: 90.9 fL (ref 78.0–100.0)
Platelets: 94 10*3/uL — ABNORMAL LOW (ref 150–400)
RDW: 14.7 % (ref 11.5–15.5)
RDW: 14.7 % (ref 11.5–15.5)
WBC: 1.8 10*3/uL — ABNORMAL LOW (ref 4.0–10.5)
WBC: 2.4 10*3/uL — ABNORMAL LOW (ref 4.0–10.5)

## 2012-03-09 LAB — BASIC METABOLIC PANEL
BUN: 15 mg/dL (ref 6–23)
Calcium: 8 mg/dL — ABNORMAL LOW (ref 8.4–10.5)
Chloride: 108 mEq/L (ref 96–112)
Creatinine, Ser: 1.34 mg/dL (ref 0.50–1.35)
GFR calc Af Amer: 59 mL/min — ABNORMAL LOW (ref >90)
GFR calc non Af Amer: 51 mL/min — ABNORMAL LOW (ref >90)

## 2012-03-09 LAB — DIFFERENTIAL
Eosinophils Absolute: 0 10*3/uL (ref 0.0–0.7)
Eosinophils Relative: 0 % (ref 0–5)
Lymphocytes Relative: 18 % (ref 12–46)
Lymphs Abs: 0.4 10*3/uL — ABNORMAL LOW (ref 0.7–4.0)
Monocytes Relative: 9 % (ref 3–12)

## 2012-03-09 LAB — URINE CULTURE

## 2012-03-09 LAB — GLUCOSE, CAPILLARY
Glucose-Capillary: 172 mg/dL — ABNORMAL HIGH (ref 70–99)
Glucose-Capillary: 200 mg/dL — ABNORMAL HIGH (ref 70–99)

## 2012-03-09 LAB — PROTIME-INR
INR: 2.43 — ABNORMAL HIGH (ref 0.00–1.49)
Prothrombin Time: 26.8 seconds — ABNORMAL HIGH (ref 11.6–15.2)

## 2012-03-09 MED ORDER — WARFARIN SODIUM 2.5 MG PO TABS
2.5000 mg | ORAL_TABLET | Freq: Once | ORAL | Status: AC
  Administered 2012-03-09: 2.5 mg via ORAL
  Filled 2012-03-09: qty 1

## 2012-03-09 NOTE — Progress Notes (Signed)
Subjective: No complains feels better. Objective: Filed Vitals:   03/08/12 1305 03/08/12 1700 03/08/12 2214 03/09/12 0549  BP:  154/74 153/76 155/78  Pulse:  59 57 51  Temp: 99.2 F (37.3 C) 99 F (37.2 C) 99 F (37.2 C) 97.8 F (36.6 C)  TempSrc: Oral  Oral Oral  Resp:  16 18 18   Height:  5\' 11"  (1.803 m)    Weight:  98.6 kg (217 lb 6 oz)    SpO2:  100% 96% 96%   Weight change:   Intake/Output Summary (Last 24 hours) at 03/09/12 0829 Last data filed at 03/09/12 0726  Gross per 24 hour  Intake 621.67 ml  Output   1950 ml  Net -1328.33 ml    General: Alert, awake, oriented x3, in no acute distress.  HEENT: No bruits, no goiter.  Heart: Regular rate and rhythm, without murmurs, rubs, gallops.  Lungs: good air movement CTA b/l  Abdomen: Soft, nontender, nondistended, positive bowel sounds.  Neuro: Grossly intact, nonfocal.   Lab Results:  Basename 03/09/12 0500 03/08/12 0336  NA 139 138  K 4.9 3.4*  CL 108 101  CO2 24 24  GLUCOSE 172* 148*  BUN 15 17  CREATININE 1.34 1.51*  CALCIUM 8.0* 8.2*  MG -- --  PHOS -- --    Basename 03/08/12 0336  AST 16  ALT 11  ALKPHOS 96  BILITOT 0.6  PROT 6.3  ALBUMIN 3.6   No results found for this basename: LIPASE:2,AMYLASE:2 in the last 72 hours  Basename 03/09/12 0500 03/08/12 0336  WBC 1.8* 3.0*  NEUTROABS -- 2.3  HGB 12.1* 12.2*  HCT 36.6* 36.1*  MCV 91.7 91.2  PLT 94* 85*   No results found for this basename: CKTOTAL:3,CKMB:3,CKMBINDEX:3,TROPONINI:3 in the last 72 hours No components found with this basename: POCBNP:3 No results found for this basename: DDIMER:2 in the last 72 hours No results found for this basename: HGBA1C:2 in the last 72 hours No results found for this basename: CHOL:2,HDL:2,LDLCALC:2,TRIG:2,CHOLHDL:2,LDLDIRECT:2 in the last 72 hours No results found for this basename: TSH,T4TOTAL,FREET3,T3FREE,THYROIDAB in the last 72 hours No results found for this basename:  VITAMINB12:2,FOLATE:2,FERRITIN:2,TIBC:2,IRON:2,RETICCTPCT:2 in the last 72 hours  Micro Results: Recent Results (from the past 240 hour(s))  CULTURE, BLOOD (ROUTINE X 2)     Status: Normal (Preliminary result)   Collection Time   03/08/12  3:36 AM      Component Value Range Status Comment   Specimen Description BLOOD LEFT HAND   Final    Special Requests BOTTLES DRAWN AEROBIC AND ANAEROBIC 5CC   Final    Culture  Setup Time [id number]   Final    Culture     Final    Value:        BLOOD CULTURE RECEIVED NO GROWTH TO DATE CULTURE WILL BE HELD FOR 5 DAYS BEFORE ISSUING A FINAL NEGATIVE REPORT   Report Status PENDING   Incomplete   CULTURE, BLOOD (ROUTINE X 2)     Status: Normal (Preliminary result)   Collection Time   03/08/12  3:44 AM      Component Value Range Status Comment   Specimen Description BLOOD RIGHT ANTECUBITAL   Final    Special Requests     Final    Value: BOTTLES DRAWN AEROBIC AND ANAEROBIC [facility] AEROBIC/4CC ANAEROBIC   Culture  Setup Time [id number]   Final    Culture     Final    Value:        BLOOD CULTURE RECEIVED  NO GROWTH TO DATE CULTURE WILL BE HELD FOR 5 DAYS BEFORE ISSUING A FINAL NEGATIVE REPORT   Report Status PENDING   Incomplete     Studies/Results: Dg Chest 2 View  03/08/2012  *RADIOLOGY REPORT*  Clinical Data: Cough and wheezing.  CHEST - 2 VIEW  Comparison: Chest radiograph performed 05/28/2009  Findings: The lungs are well-aerated.  Apparent mild left lower lobe opacity could reflect pneumonia.  Vascular congestion is noted, and mild interstitial edema could have a similar appearance. There is no evidence of pleural effusion or pneumothorax.  The heart is mildly enlarged.  No acute osseous abnormalities are seen.  IMPRESSION: Apparent mild left lower lobe airspace opacity could reflect pneumonia.  Vascular congestion and mild cardiomegaly; mild interstitial edema could have a similar appearance.  Original Report Authenticated By: Tonia Ghent, M.D.     Medications: I have reviewed the patient's current medications.  Assessment and plan: Principal Problem:  *Community acquired pneumonia: Change antibiotics to PO. Pt relates he feels much better.  -DVT (deep venous thrombosis) Continue coumadin.  -Multiple myeloma in relapse Continue meds.  -Benign essential HTN Stable.  -Acute on CRI (chronic renal insufficiency) Resolved with IV fluids.  -DM II (diabetes mellitus, type II), controlled Controlled.      LOS: 1 day   Marinda Elk M.D. Pager: [phone] [facility] 03/09/2012, 8:29 AM

## 2012-03-09 NOTE — Progress Notes (Signed)
Subjective: Admitting H&P reviewed, according to the wife patient had started some progressive malaise and productive cough. X-ray revealed pneumonia. Currently patient afebrile, white count actually is now to 1.8, differential is not added. Currently he is without fever chills nausea or vomiting  Objective: Vital signs in last 24 hours: Temp:  [97.8 F (36.6 C)-99.2 F (37.3 C)] 97.8 F (36.6 C) (03/19 0549) Pulse Rate:  [51-69] 51  (03/19 0549) Resp:  [16-18] 18  (03/19 0549) BP: (134-155)/(65-78) 155/78 mmHg (03/19 0549) SpO2:  [96 %-100 %] 96 % (03/19 0549) Weight:  [98.6 kg (217 lb 6 oz)] 98.6 kg (217 lb 6 oz) (03/18 1700) Weight change:  Last BM Date: 03/06/12 (Pt unsure of exact date---Prune juice provided)  Intake/Output from previous day: 03/18 0701 - 03/19 0700 In: 621.7 [P.O.:240; I.V.:381.7] Out: 1825 [Urine:1825] Intake/Output this shift: Total I/O In: -  Out: 125 [Urine:125]  General appearance: alert and cooperative Resp: moderate air movement bilateral, occasional rhonchi, some improvement with deep cough Cardio: regular rate and rhythm, S1, S2 normal, no murmur, click, rub or gallop GI: soft, non-tender; bowel sounds normal; no masses,  no organomegaly Extremities: extremities normal, atraumatic, no cyanosis or edema  Lab Results:  Results for orders placed during the hospital encounter of 03/08/12 (from the past 24 hour(s))  GLUCOSE, CAPILLARY     Status: Abnormal   Collection Time   03/08/12  9:37 AM      Component Value Range   Glucose-Capillary 135 (*) 70 - 99 (mg/dL)  GLUCOSE, CAPILLARY     Status: Abnormal   Collection Time   03/08/12 12:05 PM      Component Value Range   Glucose-Capillary 138 (*) 70 - 99 (mg/dL)   Comment 1 Documented in Chart    GLUCOSE, CAPILLARY     Status: Abnormal   Collection Time   03/08/12  9:35 PM      Component Value Range   Glucose-Capillary 157 (*) 70 - 99 (mg/dL)   Comment 1 Notify RN     Comment 2 Documented in  Chart    BASIC METABOLIC PANEL     Status: Abnormal   Collection Time   03/09/12  5:00 AM      Component Value Range   Sodium 139  135 - 145 (mEq/L)   Potassium 4.9  3.5 - 5.1 (mEq/L)   Chloride 108  96 - 112 (mEq/L)   CO2 24  19 - 32 (mEq/L)   Glucose, Bld 172 (*) 70 - 99 (mg/dL)   BUN 15  6 - 23 (mg/dL)   Creatinine, Ser 3.08  0.50 - 1.35 (mg/dL)   Calcium 8.0 (*) 8.4 - 10.5 (mg/dL)   GFR calc non Af Amer 51 (*) >90 (mL/min)   GFR calc Af Amer 59 (*) >90 (mL/min)  CBC     Status: Abnormal   Collection Time   03/09/12  5:00 AM      Component Value Range   WBC 1.8 (*) 4.0 - 10.5 (K/uL)   RBC 3.99 (*) 4.22 - 5.81 (MIL/uL)   Hemoglobin 12.1 (*) 13.0 - 17.0 (g/dL)   HCT 65.7 (*) 84.6 - 52.0 (%)   MCV 91.7  78.0 - 100.0 (fL)   MCH 30.3  26.0 - 34.0 (pg)   MCHC 33.1  30.0 - 36.0 (g/dL)   RDW 96.2  95.2 - 84.1 (%)   Platelets 94 (*) 150 - 400 (K/uL)  PROTIME-INR     Status: Abnormal   Collection Time  03/09/12  5:00 AM      Component Value Range   Prothrombin Time 26.8 (*) 11.6 - 15.2 (seconds)   INR 2.43 (*) 0.00 - 1.49   GLUCOSE, CAPILLARY     Status: Abnormal   Collection Time   03/09/12  8:06 AM      Component Value Range   Glucose-Capillary 172 (*) 70 - 99 (mg/dL)      Studies/Results: Dg Chest 2 View  03/08/2012  *RADIOLOGY REPORT*  Clinical Data: Cough and wheezing.  CHEST - 2 VIEW  Comparison: Chest radiograph performed 05/28/2009  Findings: The lungs are well-aerated.  Apparent mild left lower lobe opacity could reflect pneumonia.  Vascular congestion is noted, and mild interstitial edema could have a similar appearance. There is no evidence of pleural effusion or pneumothorax.  The heart is mildly enlarged.  No acute osseous abnormalities are seen.  IMPRESSION: Apparent mild left lower lobe airspace opacity could reflect pneumonia.  Vascular congestion and mild cardiomegaly; mild interstitial edema could have a similar appearance.  Original Report Authenticated By:  Tonia Ghent, M.D.    Medications:  Prior to Admission:  Prescriptions prior to admission  Medication Sig Dispense Refill  . acetaminophen (TYLENOL) 500 MG tablet Take 500 mg by mouth every 6 (six) hours as needed.       Marland Kitchen aspirin EC 81 MG tablet Take 81 mg by mouth daily.      Marland Kitchen atorvastatin (LIPITOR) 10 MG tablet Take 10 mg by mouth at bedtime.        . carbidopa-levodopa (SINEMET) 25-100 MG per tablet Take 1 tablet by mouth 3 (three) times daily.      Marland Kitchen lenalidomide (REVLIMID) 5 MG capsule Take 5 mg by mouth See admin instructions. 1 capsule po daily x 21days, then 7 day break.  To start after 7 day break & lab check 02/20/12 Auth # [id number] same as previous 10mg  dose that was cancelled with [facility]--verified with [employer] Script faxed to [facility] @@ [phone]      . metFORMIN (GLUCOPHAGE) 500 MG tablet Take 500 mg by mouth daily with breakfast.        . pantoprazole (PROTONIX) 40 MG tablet Take 40 mg by mouth daily as needed. For acid reflux      . polyethylene glycol (MIRALAX / GLYCOLAX) packet Take 17 g by mouth daily as needed.        . pregabalin (LYRICA) 100 MG capsule Take 1 capsule (100 mg total) by mouth daily.  90 capsule  1  . valsartan (DIOVAN) 80 MG tablet Take 80 mg by mouth daily.        Marland Kitchen warfarin (COUMADIN) 5 MG tablet Take 2.5-5 mg by mouth See admin instructions. Takes 2.5mg  on Monday, Wednesday, and Friday. 5 mg is taken on Tuesday, Thursday, Saturday, and Sunday.       Scheduled:   . albuterol      . albuterol      . aspirin EC  81 mg Oral Daily  . atorvastatin  10 mg Oral QHS  . azithromycin  500 mg Intravenous Q24H  . carbidopa-levodopa  1 tablet Oral TID  . cefTRIAXone (ROCEPHIN)  IV  1 g Intravenous Q24H  . docusate sodium  100 mg Oral BID  . guaiFENesin  600 mg Oral BID  . insulin aspart  0-5 Units Subcutaneous QHS  . insulin aspart  0-9 Units Subcutaneous TID WC  . irbesartan  75 mg Oral Daily  . lenalidomide  5  mg Oral Daily  . pantoprazole  40  mg Oral Daily  . potassium chloride  40 mEq Oral BID  . predniSONE  40 mg Oral Q breakfast  . pregabalin  100 mg Oral Daily  . sodium chloride  3 mL Intravenous Q12H  . warfarin  2.5 mg Oral Once  . Warfarin - Pharmacist Dosing Inpatient   Does not apply q1800  . DISCONTD: lenalidomide  5 mg Oral Daily  . DISCONTD: Warfarin - Pharmacist Dosing Inpatient   Does not apply q1800   Continuous:   . sodium chloride 100 mL/hr at 03/09/12 0453    Assessment/Plan: Community-acquired pneumonia in a patient with myeloma, now with leukopenia, continue antibiotics check followup CBC with differential. If further drop in WBC count hematology will be contacted. Multiple myeloma, followed by Dr. Cyndie Chime Chronic kidney disease Diabetes Recently diagnosed Parkinson's History of DVT Hypokalemia, repleted Leukopenia  LOS: 1 day   Loucinda Croy D 03/09/2012, 8:35 AM

## 2012-03-09 NOTE — Evaluation (Signed)
Occupational Therapy Evaluation Patient Details Name: [name] [name] MRN: [id number] DOB: 12-31-1940 Today's Date: 03/09/2012  Problem List:  Patient Active Problem List  Diagnoses  . DVT (deep venous thrombosis)  . Multiple myeloma in relapse  . Benign essential HTN  . GERD (gastroesophageal reflux disease)  . CRI (chronic renal insufficiency)  . DJD (degenerative joint disease) of lumbar spine  . DVT of lower extremity (deep venous thrombosis)  . Hyperlipidemia, mixed  . Herpes zoster infection  . DM II (diabetes mellitus, type II), controlled  . Community acquired pneumonia    Past Medical History:  Past Medical History  Diagnosis Date  . Diabetes mellitus   . GERD (gastroesophageal reflux disease)   . Anemia   . Hypertension   . Prostatic hyperplasia   . Arthritis   . Multiple myeloma in relapse 12/24/2011  . Benign essential HTN 12/24/2011  . CRI (chronic renal insufficiency) 12/26/2011  . 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 DVT of lower extremity (deep venous thrombosis) 05/14/2011  . Hyperlipidemia, mixed 12/26/2011  . Herpes zoster infection 03/11/2010  . DM II (diabetes mellitus, type II), controlled 12/26/2011   Past Surgical History: History reviewed. No pertinent past surgical history.  OT Assessment/Plan/Recommendation OT Assessment Clinical Impression Statement: Pt is a 71 yo male who presents with CAP. Feel pt is very close to baseline. Skilled OT recommended to maximize independence back to baseline level in prep for safe d/c home with prn A from wife. OT Recommendation/Assessment: Patient will need skilled OT in the acute care venue OT Problem List: Decreased activity tolerance;Decreased safety awareness;Decreased knowledge of use of DME or AE;Impaired balance (sitting and/or standing) OT Therapy Diagnosis : Generalized weakness OT Plan OT Frequency: Min 2X/week OT Treatment/Interventions: Self-care/ADL training;Therapeutic activities;DME  and/or AE instruction;Patient/family education OT Recommendation Follow Up Recommendations: No OT follow up Equipment Recommended: None recommended by OT Individuals Consulted Consulted and Agree with Results and Recommendations: Patient;Family member/caregiver OT Goals Acute Rehab OT Goals OT Goal Formulation: With patient/family Time For Goal Achievement: 2 weeks ADL Goals Pt Will Perform Grooming: with modified independence;Standing at sink ADL Goal: Grooming - Progress: Goal set today Pt Will Transfer to Toilet: with modified independence;Ambulation;Regular height toilet ADL Goal: Toilet Transfer - Progress: Goal set today Pt Will Perform Toileting - Clothing Manipulation: with modified independence;Standing ADL Goal: Toileting - Clothing Manipulation - Progress: Goal set today Pt Will Perform Toileting - Hygiene: with modified independence;Sit to stand from 3-in-1/toilet ADL Goal: Toileting - Hygiene - Progress: Goal set today Pt Will Perform Tub/Shower Transfer: Shower transfer;with modified independence;Ambulation ADL Goal: Tub/Shower Transfer - Progress: Goal set today  OT Evaluation Precautions/Restrictions  Restrictions Weight Bearing Restrictions: No Prior Functioning Home Living Lives With: Spouse Type of Home: House Home Layout: Two level;Able to live on main level with bedroom/bathroom Home Access: Stairs to enter Entrance Stairs-Rails: Right;Left Entrance Stairs-Number of Steps: 3 Bathroom Shower/Tub: Health visitor: Standard Home Adaptive Equipment: Straight cane;Walker - rolling Prior Function Level of Independence: Needs assistance with ADLs;Requires assistive device for independence;Needs assistance with homemaking Bath: Moderate Dressing: Moderate Meal Prep: Total Light Housekeeping: Total Driving: No Vocation: Retired ADL ADL Grooming: Simulated;Supervision/safety Where Assessed - Grooming: Standing at sink Upper Body Bathing:  Simulated;Supervision/safety Where Assessed - Upper Body Bathing: Standing at sink Lower Body Bathing: Simulated;Minimal assistance Where Assessed - Lower Body Bathing: Sit to stand from bed Upper Body Dressing: Simulated;Supervision/safety Where Assessed - Upper Body Dressing: Standing Lower Body Dressing: Performed;Moderate assistance Where Assessed -  Lower Body Dressing: Sit to stand from bed Toilet Transfer: Performed;Supervision/safety Toilet Transfer Method: Proofreader: Regular height toilet;Grab bars Toileting - Clothing Manipulation: Simulated;Supervision/safety Where Assessed - Toileting Clothing Manipulation: Sit to stand from 3-in-1 or toilet Toileting - Hygiene: Simulated;Supervision/safety Where Assessed - Toileting Hygiene: Sit to stand from 3-in-1 or toilet Tub/Shower Transfer: Not assessed Tub/Shower Transfer Method: Not assessed Equipment Used: Rolling walker Ambulation Related to ADLs: Co-session with PT. Pt ambulated up and down hallway with a slow and steady gait. Vision/Perception    Cognition Cognition Arousal/Alertness: Awake/alert Overall Cognitive Status: Appears within functional limits for tasks assessed Orientation Level: Oriented X4 Sensation/Coordination   Extremity Assessment RUE Assessment RUE Assessment: Within Functional Limits LUE Assessment LUE Assessment: Within Functional Limits Mobility  Bed Mobility Bed Mobility: Yes Supine to Sit: 5: Supervision;With rails;HOB flat Transfers Transfers: Yes Sit to Stand: 5: Supervision;From bed;From toilet;With upper extremity assist Sit to Stand Details (indicate cue type and [name]): min VCs for hand placement Stand to Sit: 5: Supervision;With upper extremity assist;With armrests;To chair/3-in-1;To toilet Stand to Sit Details: min VCs for hand placement Exercises   End of Session OT - End of Session Equipment Utilized During Treatment: Gait belt Activity Tolerance:  Patient tolerated treatment well Patient left: in chair;with call bell in reach General Behavior During Session: [facility] for tasks performed Cognition: [facility] for tasks performed   Trenton Passow A, OTR/L [phone] 03/09/2012, 11:43 AM

## 2012-03-09 NOTE — Progress Notes (Signed)
ANTICOAGULATION CONSULT NOTE - Follow up Consult  Pharmacy Consult for: Warfarin Indication: DVT  No Known Allergies  Patient Measurements: Height: 5\' 11"  (180.3 cm) Weight: 217 lb 6 oz (98.6 kg) IBW/kg (Calculated) : 75.3    Vital Signs: Temp: 97.8 F (36.6 C) (03/19 0549) Temp src: Oral (03/19 0549) BP: 155/78 mmHg (03/19 0549) Pulse Rate: 51  (03/19 0549)  Labs:  Basename 03/09/12 0959 03/09/12 0500 03/08/12 0336  HGB 11.9* 12.1* --  HCT 34.9* 36.6* 36.1*  PLT 92* 94* 85*  APTT -- -- --  LABPROT -- 26.8* 24.8*  INR -- 2.43* 2.20*  HEPARINUNFRC -- -- --  CREATININE -- 1.34 1.51*  CKTOTAL -- -- --  CKMB -- -- --  TROPONINI -- -- --   Estimated Creatinine Clearance: 59.6 ml/min (by C-G formula based on Cr of 1.34).  Medications:  Scheduled:     . albuterol      . albuterol      . aspirin EC  81 mg Oral Daily  . atorvastatin  10 mg Oral QHS  . azithromycin  500 mg Intravenous Q24H  . carbidopa-levodopa  1 tablet Oral TID  . cefTRIAXone (ROCEPHIN)  IV  1 g Intravenous Q24H  . docusate sodium  100 mg Oral BID  . guaiFENesin  600 mg Oral BID  . insulin aspart  0-5 Units Subcutaneous QHS  . insulin aspart  0-9 Units Subcutaneous TID WC  . irbesartan  75 mg Oral Daily  . lenalidomide  5 mg Oral Daily  . pantoprazole  40 mg Oral Daily  . potassium chloride  40 mEq Oral BID  . predniSONE  40 mg Oral Q breakfast  . pregabalin  100 mg Oral Daily  . sodium chloride  3 mL Intravenous Q12H  . warfarin  2.5 mg Oral Once  . Warfarin - Pharmacist Dosing Inpatient   Does not apply q1800  . DISCONTD: lenalidomide  5 mg Oral Daily  . DISCONTD: Warfarin - Pharmacist Dosing Inpatient   Does not apply q1800    Assessment:  71 yo M with hx DVT in 2012 on chronic coumadin, admit with CAP  Home dose = 2.5 mg on MWF and 5 mg on Tues/Thurs/Sat/Sun; last dose as 3/17  INR therapeutic at 2.4  Goal of Therapy:   INR 2-3   Plan:  1.) Coumadin 2.5 mg PO tonight 2.) Daily  PT/INR   Lynann Beaver PharmD, BCPS Pager [phone] 03/09/2012 11:33 AM

## 2012-03-09 NOTE — Evaluation (Signed)
Physical Therapy Evaluation Patient Details Name: [name] [name] MRN: [id number] DOB: [birth year] Today's Date: 03/09/2012  Problem List:  Patient Active Problem List  Diagnoses  . DVT (deep venous thrombosis)  . Multiple myeloma in relapse  . Benign essential HTN  . GERD (gastroesophageal reflux disease)  . CRI (chronic renal insufficiency)  . DJD (degenerative joint disease) of lumbar spine  . DVT of lower extremity (deep venous thrombosis)  . Hyperlipidemia, mixed  . Herpes zoster infection  . DM II (diabetes mellitus, type II), controlled  . Community acquired pneumonia    Past Medical History:  Past Medical History  Diagnosis Date  . Diabetes mellitus   . GERD (gastroesophageal reflux disease)   . Anemia   . Hypertension   . Prostatic hyperplasia   . Arthritis   . Multiple myeloma in relapse 12/24/2011  . Benign essential HTN 12/24/2011  . CRI (chronic renal insufficiency) 12/26/2011  . 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 DVT of lower extremity (deep venous thrombosis) 05/14/2011  . Hyperlipidemia, mixed 12/26/2011  . Herpes zoster infection 03/11/2010  . DM II (diabetes mellitus, type II), controlled 12/26/2011   Past Surgical History: History reviewed. No pertinent past surgical history.  PT Assessment/Plan/Recommendation PT Assessment Clinical Impression Statement: Pt with diagnosis of CAP.  Pt would benefit from acute PT services in order to increase independence with ambulation and stairs and improve activity tolerance prior to d/c home with spouse.  Pt and spouse feel they will likely return to baseline and do not need HHPT at this time. PT Recommendation/Assessment: Patient will need skilled PT in the acute care venue PT Problem List: Decreased activity tolerance;Decreased knowledge of use of DME;Decreased mobility PT Therapy Diagnosis : Difficulty walking;Generalized weakness PT Plan PT Frequency: Min 3X/week PT Treatment/Interventions: DME  instruction;Gait training;Functional mobility training;Stair training;Therapeutic exercise;Therapeutic activities;Patient/family education PT Recommendation Follow Up Recommendations: No PT follow up Equipment Recommended: None recommended by PT PT Goals  Acute Rehab PT Goals PT Goal Formulation: With patient Time For Goal Achievement: 7 days Pt will go Sit to Stand: with modified independence PT Goal: Sit to Stand - Progress: Goal set today Pt will go Stand to Sit: with modified independence PT Goal: Stand to Sit - Progress: Goal set today Pt will Ambulate: >150 feet;with modified independence;with least restrictive assistive device PT Goal: Ambulate - Progress: Goal set today Pt will Go Up / Down Stairs: 3-5 stairs;with supervision;with rail(s) PT Goal: Up/Down Stairs - Progress: Goal set today Pt will Perform Home Exercise Program: with supervision, verbal cues required/provided PT Goal: Perform Home Exercise Program - Progress: Goal set today  PT Evaluation Precautions/Restrictions  Precautions Precautions: Fall Prior Functioning  Home Living Lives With: Spouse Type of Home: House Home Layout: Two level;Able to live on main level with bedroom/bathroom Home Access: Stairs to enter Entrance Stairs-Rails: Right;Left Entrance Stairs-Number of Steps: 3 Bathroom Shower/Tub: Health visitor: Standard Home Adaptive Equipment: Straight cane;Walker - rolling Prior Function Level of Independence: Needs assistance with ADLs;Requires assistive device for independence;Needs assistance with homemaking Bath: Moderate Dressing: Moderate Meal Prep: Total Light Housekeeping: Total Driving: No Vocation: Retired Producer, television/film/video: Awake/alert Overall Cognitive Status: Appears within functional limits for tasks assessed Orientation Level: Oriented X4 Sensation/Coordination Sensation Light Touch: Appears Intact Extremity Assessment RUE Assessment RUE  Assessment: Within Functional Limits LUE Assessment LUE Assessment: Within Functional Limits RLE Assessment RLE Assessment: Within Functional Limits LLE Assessment LLE Assessment: Within Functional Limits Mobility (including Balance) Bed Mobility Bed Mobility:  Yes Supine to Sit: 5: Supervision;HOB flat;With rails Transfers Transfers: Yes Sit to Stand: 5: Supervision;From bed;With upper extremity assist;From toilet Sit to Stand Details (indicate cue type and reason): verbal cue for hand placement Stand to Sit: 5: Supervision;With upper extremity assist Stand to Sit Details: verbal cue for hand placement and bring RW back to chair Ambulation/Gait Ambulation/Gait: Yes Ambulation/Gait Assistance: 4: Min assist Ambulation/Gait Assistance Details (indicate cue type and reason): min/guard, verbal cues for posture and RW distance, did well with RW, may try cane next visit, verbal cue for increasing stride length Ambulation Distance (Feet): 300 Feet Assistive device: Rolling walker Gait Pattern: Step-through pattern;Decreased stride length    Exercise    End of Session PT - End of Session Equipment Utilized During Treatment: Gait belt Activity Tolerance: Patient tolerated treatment well Patient left: in chair;with call bell in reach;with family/visitor present Nurse Communication: Other (comment) (nsg tech aware pt up in chair) General Behavior During Session: [facility] for tasks performed Cognition: [facility] for tasks performed Co-tx with OT.  Maida Sale E 03/09/2012, 12:03 PM Pager: [phone]

## 2012-03-10 ENCOUNTER — Telehealth: Payer: Self-pay | Admitting: Pharmacist

## 2012-03-10 ENCOUNTER — Ambulatory Visit: Payer: Medicare Other

## 2012-03-10 ENCOUNTER — Other Ambulatory Visit: Payer: Medicare Other | Admitting: Lab

## 2012-03-10 ENCOUNTER — Telehealth: Payer: Self-pay | Admitting: Certified Registered Nurse Anesthetist

## 2012-03-10 LAB — PROTIME-INR: INR: 3.22 — ABNORMAL HIGH (ref 0.00–1.49)

## 2012-03-10 LAB — GLUCOSE, CAPILLARY: Glucose-Capillary: 131 mg/dL — ABNORMAL HIGH (ref 70–99)

## 2012-03-10 MED ORDER — GUAIFENESIN ER 600 MG PO TB12: 600.0000 mg | ORAL_TABLET | Freq: Two times a day (BID) | ORAL | Status: DC

## 2012-03-10 MED ORDER — MOXIFLOXACIN HCL 400 MG PO TABS: 400.0000 mg | ORAL_TABLET | Freq: Every day | ORAL | Status: AC

## 2012-03-10 NOTE — Progress Notes (Signed)
  Pharmacy Note (Brief) - Warfarin  Discussed INR tren (and current INR =3.22) with Dr. Nehemiah Settle over the phone. MD would like pt to resume his normal outpatient dose of warfarin tonight at home (take a dose tonight, and the usual dose on Thursday) then report to Dr. Idelle Crouch office on Friday to have his INR checked.  This info will be relayed to the patient.  Darrol Angel, PharmD Pager: [phone] 03/10/2012 1:57 PM

## 2012-03-10 NOTE — Progress Notes (Signed)
CARE MANAGEMENT NOTE 03/10/2012  Patient:  [name] [name], [name] [name]   Account Number:  [id number]  Date Initiated:  03/10/2012  Documentation initiated by:  Colleen Can  Subjective/Objective Assessment:   dx CAP  Orders for [facility] services     Action/Plan:   CM spoke with patient and spouse. Current plans are for patient to return to his home in [place] whewre spouse will be caregiver. Pt already has RW at home but will need 3N1   Anticipated DC Date:  03/10/2012   Anticipated DC Plan:  HOME W HOME HEALTH SERVICES  In-house referral  NA      DC Planning Services  CM consult      [facility] Choice  HOME HEALTH  DURABLE MEDICAL EQUIPMENT   Choice offered to / List presented to:  C-1 Patient   DME arranged  3-N-1      DME agency  [facility]     [facility] arranged  HH-2 PT  HH-1 RN      [facility] agency  [facility]   Status of service:  Completed, signed off Medicare Important Message given?  NA - LOS <3 / Initial given by admissions (If response is "NO", the following Medicare IM given date fields will be blank) Date Medicare IM given:   Date Additional Medicare IM given:    Discharge Disposition:  HOME W HOME HEALTH SERVICES   Comments:  03/10/2012 Raynelle Bring BSN CCM [phone] Spouse is requestiing [facility] for [facility] services. States she has used them in the past. List of [facility] agencies placed in shadow chart. Genev[facility] services to start tomorrow 03/11/2012.Information placed in [facility].

## 2012-03-10 NOTE — Clinical Documentation Improvement (Signed)
CKD DOCUMENTATION CLARIFICATION QUERY   THIS DOCUMENT IS NOT A PERMANENT PART OF THE MEDICAL RECORD  TO RESPOND TO THE THIS QUERY, FOLLOW THE INSTRUCTIONS BELOW:  1. If needed, update documentation for the patient's encounter via the notes activity.  2. Access this query again and click edit on the In [employer].  3. After updating, or not, click F2 to complete all highlighted (required) fields concerning your review. Select "additional documentation in the medical record" OR "no additional documentation provided".  4. Click Sign note button.  5. The deficiency will fall out of your In Basket *Please let us know if you are not able to complete this workflow by phone or e-mail (listed below).  Please update your documentation within the medical record to reflect your response to this query.                                                                                        03/10/12   Dear Dr. [facility],  In a better effort to capture your patient's severity of illness, reflect appropriate length of stay and utilization of resources, a review of the patient medical record has revealed the following indicators.    Based on your clinical judgment, please clarify and document in a progress note and/or discharge summary the clinical condition associated with the following supporting information:  In responding to this query please exercise your independent judgment.  The fact that a query is asked, does not imply that any particular answer is desired or expected.  03/09/12 progr note.Marland KitchenMarland Kitchen"Chronic kidney disease". For accurate Dx specificity & severity can noted "CKD" be further specified with stage. Thank you   Possible Clinical Conditions?   -CKD Stage I -  GFR > OR = 90 -CKD Stage II - GFR 60-80 -CKD Stage III - GFR 30-59 -CKD Stage IV - GFR 15-29 -CKD Stage V - GFR < 15 -ESRD (End Stage Renal Disease) -Other condition -Cannot Clinically determine   Supporting  Information: Risk Factors: Per 03/08/12 H&P h/o "Chronic Renal Insuffiency".  Signs & Symptoms:  Diagnostics: 03/08/12: Creatinine, Ser 1.51 (*)  03/09/12: Creatinine, Ser 1.34  GFR: 03/08/12: GFR calc Af Amer 51 (*)  03/09/12: GFR calc Af Amer 59 (*)   Urine:  Treatment: 03/08/12: IV fluids, monitor labs   You may use possible, probable, or suspect with inpatient documentation. possible, probable, suspected diagnoses MUST be documented at the time of discharge  Reviewed: additional documentation in the MEDICAL RECORD NUMBER3/27/13: pt dc'd 03/10/12>dc summ rev.orm   Thank You,  Toribio Harbour, RN, BSN, CCDS Certified Clinical Documentation Specialist Pager: [phone]  Health Information Management [place]

## 2012-03-10 NOTE — Discharge Summary (Signed)
Physician Discharge Summary  Patient ID: [name] [name] MRN: [id number] DOB/AGE: 72-Apr-1942 72 y.o.  Admit date: 03/08/2012 Discharge date: 03/10/2012  Admission Diagnoses:  Short of breath congestion Discharge Diagnoses:  Principal Problem:  *Community acquired pneumonia Active Problems:  DVT (deep venous thrombosis)  Multiple myeloma in relapse  Benign essential HTN  GERD (gastroesophageal reflux disease)  CRI (chronic renal insufficiency)  DM II (diabetes mellitus, type II), controlled Leukopenia Parkinson's disease  Discharged Condition: stable  Hospital Course:  Patient presented to the hospital with complaint of wheezing congestion, x-ray suggestive of pneumonia. Admission was deemed necessary for further evaluation and treatment. Patient was started on treatment for Chlamydia acquired pneumonia. He was given gentle IV hydration. There were no comp occasions during his hospitalization. He's afebrile, tolerating p.o. Intake and is back to his baseline level of function. He is slightly deconditioned, it is felt that he would do well with physical therapy at home. Patient will continue a weeks course of antibiotics will have a followup appointment one week. Currently there is no shortness of breath no wheeze.  Consults:    Significant Diagnostic Studies:Dg Chest 2 View  03/08/2012  *RADIOLOGY REPORT*  Clinical Data: Cough and wheezing.  CHEST - 2 VIEW  Comparison: Chest radiograph performed 05/28/2009  Findings: The lungs are well-aerated.  Apparent mild left lower lobe opacity could reflect pneumonia.  Vascular congestion is noted, and mild interstitial edema could have a similar appearance. There is no evidence of pleural effusion or pneumothorax.  The heart is mildly enlarged.  No acute osseous abnormalities are seen.  IMPRESSION: Apparent mild left lower lobe airspace opacity could reflect pneumonia.  Vascular congestion and mild cardiomegaly; mild interstitial edema could have a  similar appearance.  Original Report Authenticated By: Tonia Ghent, M.D.   Mr Brain Wo Contrast  02/12/2012  This examination was performed at [facility] at [facility]. The interpretation will be provided by [facility].  Original Report Authenticated By: Harley Hallmark, M.D.      Discharge Exam: Blood pressure 150/79, pulse 47, temperature 97.7 F (36.5 C), temperature source Oral, resp. rate 18, height 5\' 11"  (1.803 m), weight 98.6 kg (217 lb 6 oz), SpO2 97.00%. General appearance: alert and cooperative Resp: occasional rhonchi, improved with deep cough Cardio: regular rate and rhythm, S1, S2 normal, no murmur, click, rub or gallop Extremities: extremities normal, atraumatic, no cyanosis or edema  Disposition:   Discharge Orders    Future Appointments: Provider: Department: Dept Phone: Center:   03/10/2012 10:30 AM Radene Gunning [facility] [phone] None   03/10/2012 10:45 AM [facility] [phone] None   03/26/2012 2:00 PM Krista Blue [facility] [phone] None   03/26/2012 2:30 PM Levert Feinstein, MD [facility] [phone] None     Medication List  As of 03/10/2012 10:19 AM   TAKE these medications         acetaminophen 500 MG tablet   Commonly known as: TYLENOL   Take 500 mg by mouth every 6 (six) hours as needed.      aspirin EC 81 MG tablet   Take 81 mg by mouth daily.      atorvastatin 10 MG tablet   Commonly known as: LIPITOR   Take 10 mg by mouth at bedtime.      carbidopa-levodopa 25-100 MG per tablet   Commonly known as: SINEMET   Take 1 tablet by mouth 3 (three) times daily.  guaiFENesin 600 MG 12 hr tablet   Commonly known as: MUCINEX   Take 1 tablet (600 mg total) by mouth 2 (two) times daily.      lenalidomide 5 MG capsule   Commonly known as: REVLIMID   Take 5 mg by mouth See admin instructions. 1 capsule po daily x 21days, then 7 day break.  To start after 7  day break & lab check 02/20/12  Auth # [id number] same as previous 10mg  dose that was cancelled with [facility]--verified with [facility]  Script faxed to [facility] @@ [phone]      metFORMIN 500 MG tablet   Commonly known as: GLUCOPHAGE   Take 500 mg by mouth daily with breakfast.      moxifloxacin 400 MG tablet   Commonly known as: AVELOX   Take 1 tablet (400 mg total) by mouth daily.      pantoprazole 40 MG tablet   Commonly known as: PROTONIX   Take 40 mg by mouth daily as needed. For acid reflux      polyethylene glycol packet   Commonly known as: MIRALAX / GLYCOLAX   Take 17 g by mouth daily as needed.      pregabalin 100 MG capsule   Commonly known as: LYRICA   Take 1 capsule (100 mg total) by mouth daily.      valsartan 80 MG tablet   Commonly known as: DIOVAN   Take 80 mg by mouth daily.      warfarin 5 MG tablet   Commonly known as: COUMADIN   Take 2.5-5 mg by mouth See admin instructions. Takes 2.5mg  on Monday, Wednesday, and Friday. 5 mg is taken on Tuesday, Thursday, Saturday, and Sunday.           Follow-up Information    Follow up with Addalee Kavanagh D, MD in 1 week.         SignedRenford Dills D 03/10/2012, 10:19 AM

## 2012-03-10 NOTE — Telephone Encounter (Signed)
Received call from switchboard, "pt.'s son is on the phone for an emergency situation".  Call transferred to me in triage. Son requested for Dr. Patsy Lager nurse.  I informed pt. I am the triage nurse and can assist him with his emergency situation.  Pt. Began to inform me that his father has been weak the last few days, went to the emergency room and was admitted. He is now in the hospital.  Son Jayel Inks) stated that his father was told that he has "a touch of pneumonia".  Pt. Is still in the hospital at this time.  Son proceeded to tell me that his father developed "tremor" awhile back , was seen by a neurologist Dr. Terrace Arabia then placed on Parkinson medication.  He was then told that he doesn't have Parkinson.  Son wants to know why his father is still taking Parkinson medications and per his research, Revlimid has s/e of "tremor".  Son wants to know if medications father is taking has caused him to be weak and bottom line, why his father still taking Parkinson medication when he was told he doesn't have Parkinson.  Son wants Dr. Reece Agar to be aware and contact neurologist for further information and call him at [phone]).  I informed son that if he wish for further information about pt's hospital stay, he should contact the hospital also.  In the meantime, I will give Dr. Cyndie Chime his message and concerns. Son stated, "this is an emergency to me".  He is upset and concern about his father's condition.  I reassured him that his message will get to Dr. Cyndie Chime.  Son requested to speak to Dr. Cyndie Chime as soon as possible regarding his father's condition.  Pt. Is in [facility] Rm. [phone].

## 2012-03-11 ENCOUNTER — Telehealth: Payer: Self-pay | Admitting: *Deleted

## 2012-03-11 NOTE — Telephone Encounter (Signed)
Received vm from pt's wife stating that he took his last revlimid today & wants to know if Dr. Cyndie Chime wants to reorder & requested to speak to Dr. Cyndie Chime.  Returned call &she also wants to make sure Dr.Granfortuna knows that pt is on parkinson med & ATB.  She knows that her son called yest.  Informed that we did not know pt was in the hosp until we heard from son yest but Dr. Cyndie Chime knew about parkinson drug ordered by someone else. Note to Dr. Cyndie Chime.

## 2012-03-12 ENCOUNTER — Ambulatory Visit (HOSPITAL_BASED_OUTPATIENT_CLINIC_OR_DEPARTMENT_OTHER): Payer: Medicare Other | Admitting: Pharmacist

## 2012-03-12 ENCOUNTER — Other Ambulatory Visit (HOSPITAL_BASED_OUTPATIENT_CLINIC_OR_DEPARTMENT_OTHER): Payer: Medicare Other | Admitting: Lab

## 2012-03-12 DIAGNOSIS — I82409 Acute embolism and thrombosis of unspecified deep veins of unspecified lower extremity: Secondary | ICD-10-CM

## 2012-03-12 DIAGNOSIS — I824Z9 Acute embolism and thrombosis of unspecified deep veins of unspecified distal lower extremity: Secondary | ICD-10-CM

## 2012-03-12 DIAGNOSIS — C9002 Multiple myeloma in relapse: Secondary | ICD-10-CM

## 2012-03-12 DIAGNOSIS — C9000 Multiple myeloma not having achieved remission: Secondary | ICD-10-CM

## 2012-03-12 LAB — PROTIME-INR: INR: 3.3 (ref 2.00–3.50)

## 2012-03-12 LAB — CBC WITH DIFFERENTIAL/PLATELET
BASO%: 1.2 % (ref 0.0–2.0)
Basophils Absolute: 0 10*3/uL (ref 0.0–0.1)
EOS%: 5.6 % (ref 0.0–7.0)
HCT: 36.7 % — ABNORMAL LOW (ref 38.4–49.9)
HGB: 12.5 g/dL — ABNORMAL LOW (ref 13.0–17.1)
LYMPH%: 22.2 % (ref 14.0–49.0)
MCH: 30.5 pg (ref 27.2–33.4)
MCHC: 34.1 g/dL (ref 32.0–36.0)
MONO#: 0.2 10*3/uL (ref 0.1–0.9)
NEUT%: 64.3 % (ref 39.0–75.0)
Platelets: 97 10*3/uL — ABNORMAL LOW (ref 140–400)
lymph#: 0.6 10*3/uL — ABNORMAL LOW (ref 0.9–3.3)

## 2012-03-12 LAB — TECHNOLOGIST REVIEW

## 2012-03-12 NOTE — Progress Notes (Signed)
INR unchanged from starting avelox 3 days ago (03/10/12 INR=3.2).  Will complete Avelox on on 03/14/12 and has a f/u with PCP on 03/17/12.  Pt wife will call if going to resume antibiotic at this time.  Otherwise will see pt back in Coumadin clinic on 03/26/12 when pt has appt with Dr. Cyndie Chime.

## 2012-03-13 ENCOUNTER — Other Ambulatory Visit: Payer: Self-pay | Admitting: Oncology

## 2012-03-14 LAB — CULTURE, BLOOD (ROUTINE X 2)
Culture  Setup Time: 201303180840
Culture: NO GROWTH

## 2012-03-15 ENCOUNTER — Other Ambulatory Visit: Payer: Self-pay | Admitting: *Deleted

## 2012-03-15 NOTE — Telephone Encounter (Signed)
THIS REQUEST WAS PLACED IN DR.GRANFORTUNA'S ACTIVE WORK IN BOX. 

## 2012-03-15 NOTE — Progress Notes (Signed)
Note faxed to [facility] that pt's Revlimid is being held until MD appt on 4/5.  Pt's wife has been notified by Dr Cyndie Chime. dph

## 2012-03-26 ENCOUNTER — Ambulatory Visit (HOSPITAL_BASED_OUTPATIENT_CLINIC_OR_DEPARTMENT_OTHER): Payer: Medicare Other | Admitting: Oncology

## 2012-03-26 ENCOUNTER — Telehealth: Payer: Self-pay | Admitting: Oncology

## 2012-03-26 ENCOUNTER — Other Ambulatory Visit (HOSPITAL_BASED_OUTPATIENT_CLINIC_OR_DEPARTMENT_OTHER): Payer: Medicare Other | Admitting: Lab

## 2012-03-26 ENCOUNTER — Ambulatory Visit (HOSPITAL_BASED_OUTPATIENT_CLINIC_OR_DEPARTMENT_OTHER): Payer: Medicare Other | Admitting: Pharmacist

## 2012-03-26 DIAGNOSIS — I824Z9 Acute embolism and thrombosis of unspecified deep veins of unspecified distal lower extremity: Secondary | ICD-10-CM

## 2012-03-26 DIAGNOSIS — C9001 Multiple myeloma in remission: Secondary | ICD-10-CM

## 2012-03-26 DIAGNOSIS — N289 Disorder of kidney and ureter, unspecified: Secondary | ICD-10-CM

## 2012-03-26 DIAGNOSIS — I82409 Acute embolism and thrombosis of unspecified deep veins of unspecified lower extremity: Secondary | ICD-10-CM

## 2012-03-26 DIAGNOSIS — G589 Mononeuropathy, unspecified: Secondary | ICD-10-CM

## 2012-03-26 DIAGNOSIS — C9002 Multiple myeloma in relapse: Secondary | ICD-10-CM

## 2012-03-26 LAB — CBC WITH DIFFERENTIAL/PLATELET
BASO%: 1.2 % (ref 0.0–2.0)
Eosinophils Absolute: 0 10*3/uL (ref 0.0–0.5)
MCHC: 33 g/dL (ref 32.0–36.0)
MONO#: 0.1 10*3/uL (ref 0.1–0.9)
NEUT#: 2.3 10*3/uL (ref 1.5–6.5)
RBC: 4.18 10*6/uL — ABNORMAL LOW (ref 4.20–5.82)
WBC: 3.1 10*3/uL — ABNORMAL LOW (ref 4.0–10.3)
lymph#: 0.6 10*3/uL — ABNORMAL LOW (ref 0.9–3.3)
nRBC: 0 % (ref 0–0)

## 2012-03-26 LAB — PROTIME-INR

## 2012-03-26 NOTE — Progress Notes (Signed)
Hematology and Oncology Follow Up Visit  [name] [name] [id number] 1941-04-09 71 y.o. 03/26/2012 4:27 PM   Principle Diagnosis: Encounter Diagnosis  Name Primary?  . Multiple myeloma in remission Yes     Interim History:   Followup visit for this 47 -year-old man with lambda light chain multiple myeloma initially diagnosed in June of 2010 when he presented with hip and rib pain and was found to have multiple lytic bone lesions. Serum immunoglobulins all suppressed. Monoclonal lambda on immunofixation electrophoresis. Marked elevation of serum free lambda light chain at 3.5 g. Initial urine with 6.7 g of protein primarily free lambda light chains. Bone marrow biopsy with 87% plasma cells. He was induced into a remission with a combination of Velcade plus dexamethasone with subsequent addition of Revlimid. Velcade had to be stopped due to progressive neuropathy. He went on to receive IV melphalan with autologous bone marrow support at [facility] on 03/12/10. Course complicated by a herpes zoster infection of a cervical dermatome. He remained off treatment until small amount of monoclonal lambda free light chain again found in the urine in March 2012. He was started on Revlimid 10 mg daily beginning 04/09/11. Due to progressive myelosuppression, this was changed to a three-week on one-week off schedule beginning in August 2012. More recently dose was further decreased to 5 mg 3 weeks on one week off on March 18th but he wound up getting admitted to the hospital that day with fever, cough, and suspicion for an early pulmonary infiltrate. I stopped the Revlimid at that point.  On the day of hospital discharge March 20 we got a phone call to our office from his son who was very irate. He wanted to know why his father was so weak, why he was in the hospital, and why he was put on medication for Parkinson's disease. At that point I was not informed that the patient was in the hospital.  His internist had referred him  to a neurologist who diagnosed with Parkinson's disease and started him on medication. We just became aware of this at time of a office visit here on February 21. I called the son and left messages on his phone on 2 occasions because he stated it was an emergency that he talk with me. I tried to explain that I was not aware that the patient was in the hospital, I reviewed the chart record and was able to tell him that he did have a serious infection, there was no major infiltrate on the chest x-ray, and that any infection in an older person is serious and would be a good reason why he was weak, in somebody who is immunosuppressed on chemotherapy with a blood disorder he has additional reasons to be weak and has decreased resistance to infection. I informed him that we did not initiate the referral to the neurologist nor did in our office start any medication with regard to Parkinson's disease. He never returned my calls..  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 Most recent laboratory reevaluation done February 21 showed stable serum free kappa and lambda light chains with a normal kappa lambda ratio of 0.65 reflecting his known chronic renal insufficiency, stable proteinuria of 632 mg in a 24-hour collection, and stable free lambda light chain in the urine a 4.5 mg percent.  Off the Revlimid now for about 3 weeks his blood counts are improving  with white count up to 2300, hemoglobin 12.6, with a stable platelet count of 76,000. BUN and creatinine remains stable at 15 and 1.3 respectively. Albumin low normal at 3.6 g percent.  He is still experiencing muscle soreness in a band like distribution around his lower thorax worse with bending motions. No new areas of bone pain and a skeletal survey done January 4 showed stable bone changes with no new lytic lesions.   Medications: reviewed  Allergies: No  Known Allergies  Review of Systems: Constitutional:   He still gets fatigue easily but doing much better since hospital discharge. Respiratory: Recent cough and wheezing have resolved Cardiovascular:  No chest pain or palpitations Gastrointestinal: Chronic constipation relieved with when necessary laxatives Genito-Urinary: No urinary tract symptoms Musculoskeletal: See above Neurologic: Decreased tremor on Sinemet and Azilect and better locomotion Skin: No rash or ecchymosis Remaining ROS negative.  Physical Exam: There were no vitals taken for this visit. Wt Readings from Last 3 Encounters:  03/08/12 217 lb 6 oz (98.6 kg)  02/12/12 219 lb 14.4 oz (99.746 kg)  12/26/11 219 lb 4.8 oz (99.474 kg)     General appearance: Well-nourished African American man HENNT: Pharynx no erythema exudate or ulcer Lymph nodes: No adenopathy Breasts: Lungs: Clear to auscultation resonant to percussion Heart: Regular rhythm no murmur Abdomen: Soft nontender Extremities: No edema no calf tenderness Vascular: No cyanosis Neurologic: Motor strength 5 over 5. Reflexes absent symmetric at the knees 1+ at the biceps. Moderate decreased to vibration sensation over the fingertips by tuning fork exam Skin: No rash or ecchymoses  Lab Results: Lab Results  Component Value Date   WBC 3.1* 03/26/2012   HGB 13.2 03/26/2012   HCT 39.9 03/26/2012   MCV 95.4 03/26/2012   PLT 91* 03/26/2012     Chemistry      Component Value Date/Time   NA 139 03/09/2012 0500   NA 145 06/13/2009 1503   K 4.9 03/09/2012 0500   K 4.8* 06/13/2009 1503   CL 108 03/09/2012 0500   CL 108 06/13/2009 1503   CO2 24 03/09/2012 0500   CO2 29 06/13/2009 1503   BUN 15 03/09/2012 0500   BUN 49* 06/13/2009 1503   CREATININE 1.34 03/09/2012 0500   CREATININE 1.41* 08/26/2011 1140   CREATININE 1.41* 08/26/2011 1140   CREATININE 1.41* 08/26/2011 1140   CREATININE 1.41* 08/26/2011 1140   CREATININE 1.41* 08/26/2011 1140   CREATININE 2.9* 06/13/2009 1503        Component Value Date/Time   CALCIUM 8.0* 03/09/2012 0500   CALCIUM 10.5* 06/13/2009 1503   ALKPHOS 96 03/08/2012 0336   ALKPHOS 135* 06/13/2009 1503   AST 16 03/08/2012 0336   AST 21 06/13/2009 1503   ALT 11 03/08/2012 0336   BILITOT 0.6 03/08/2012 0336   BILITOT 0.60 06/13/2009 1503       Impression and Plan: #1. Lambda light chain multiple myeloma Stable low-grade nonselective proteinuria. Stable light chain component of the proteinuria. Slight elevation of serum free kappa and lambda light chains with a normal ratio consistent with known chronic renal insufficiency. Improvement in blood counts off Revlimid for one month Plan: I'm going to put him back on the Revlimid at 5 mg daily 3 weeks on one week rest. We discussed the availability of 2 new anti-myeloma drugs-pomalidomide and Kyprolis (carfilzomab). We will use 1 or the other of these drugs if necessary in the future if he progresses.  #2. Type 2 diabetes on oral agent  #3. Essential  hypertension.  #4. Early Parkinson's disease I believe he does have Parkinson's disease based on his clinical features. Some of the Revlimid and so that Revlimid has been associated with tremors. Although this may B so, I don't think any of the other Parkinson-like signs that he is exhibiting are related to the Revlimid.  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 Deep venous thrombosis on full dose Coumadin anticoagulation  #7. Recent pneumonitis  #8. Hyperlipidemia  #9. Degenerative arthritis  #10. Chronic renal insufficiency  #11. 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 Dr. Renford Dills, Dr. Mallie Mussel; Dr. Verita Schneiders bone marrow transplant section Gordan Payment, MD 4/5/20134:27 PM

## 2012-03-26 NOTE — Progress Notes (Signed)
Pt recently started Azilect. This shouldn't effect his INR.  Continue taking 5mg  daily except 2.5mg  on MWF. Recheck INR in ~ 2 weeks, 04/09/12 @ 11am = lab and 11:15am = Coumadin Clinic.

## 2012-03-26 NOTE — Telephone Encounter (Signed)
appts made and printed for pt aom °

## 2012-03-26 NOTE — Patient Instructions (Addendum)
Continue taking 5mg  daily except 2.5mg  on MWF. (1 tablet on Sunday, Tuesday, Thursday, Saturday and 0.5 tab on Mondays, Wednesdays and Fridays.) Recheck INR in ~ 2 weeks, 04/09/12 @ 11am = lab and 11:15am = Coumadin Clinic.

## 2012-03-29 ENCOUNTER — Other Ambulatory Visit: Payer: Self-pay

## 2012-03-29 MED ORDER — LENALIDOMIDE 5 MG PO CAPS: 5.0000 mg | ORAL_CAPSULE | ORAL | Status: DC

## 2012-03-30 LAB — KAPPA/LAMBDA LIGHT CHAINS
Kappa free light chain: 1.08 mg/dL (ref 0.33–1.94)
Kappa:Lambda Ratio: 0.59 (ref 0.26–1.65)
Lambda Free Lght Chn: 1.83 mg/dL (ref 0.57–2.63)

## 2012-03-30 LAB — COMPREHENSIVE METABOLIC PANEL
ALT: <8 U/L (ref 0–53)
AST: 11 U/L (ref 0–37)
Albumin: 4.2 g/dL (ref 3.5–5.2)
CO2: 26 mEq/L (ref 19–32)
Calcium: 8.6 mg/dL (ref 8.4–10.5)
Chloride: 103 mEq/L (ref 96–112)
Potassium: 4.2 mEq/L (ref 3.5–5.3)
Sodium: 140 mEq/L (ref 135–145)
Total Protein: 6.2 g/dL (ref 6.0–8.3)

## 2012-03-30 LAB — PROTEIN ELECTROPHORESIS, SERUM
Albumin ELP: 63 % (ref 55.8–66.1)
Alpha-2-Globulin: 10.7 % (ref 7.1–11.8)
Total Protein, Serum Electrophoresis: 6.2 g/dL (ref 6.0–8.3)

## 2012-03-30 LAB — IGG, IGA, IGM: IgM, Serum: 65 mg/dL (ref 41–251)

## 2012-03-31 ENCOUNTER — Telehealth: Payer: Self-pay | Admitting: *Deleted

## 2012-03-31 ENCOUNTER — Ambulatory Visit: Payer: Medicare Other

## 2012-03-31 NOTE — Telephone Encounter (Signed)
Received fax from [facility] stating that revlimid was shipped 08/30/12 for delivery next day.

## 2012-04-02 ENCOUNTER — Telehealth: Payer: Self-pay | Admitting: Oncology

## 2012-04-02 LAB — UIFE/LIGHT CHAINS/TP QN, 24-HR UR
Albumin, U: DETECTED
Alpha 1, Urine: DETECTED — AB
Beta, Urine: DETECTED — AB
Free Lambda Lt Chains,Ur: 2.6 mg/dL — ABNORMAL HIGH (ref 0.02–0.67)
Gamma Globulin, Urine: DETECTED — AB
Volume, Urine: 1560 mL

## 2012-04-02 NOTE — Telephone Encounter (Signed)
Moved  6/7 appt to 6/6 @ 11:45 am. LT out of office. S/w wife re change and new d/t.

## 2012-04-05 ENCOUNTER — Telehealth: Payer: Self-pay

## 2012-04-05 NOTE — Telephone Encounter (Signed)
Message copied by Albertha Ghee on Mon Apr 05, 2012 11:03 AM ------      Message from: Levert Feinstein      Created: Fri Apr 02, 2012 12:18 PM       Call pt   Urine protein coming down nicely - this is a good thing

## 2012-04-05 NOTE — Telephone Encounter (Signed)
Pt notified urine protein coming down per Dr Patsy Lager note.  Pt verbalizes understanding. dph

## 2012-04-09 ENCOUNTER — Ambulatory Visit (HOSPITAL_BASED_OUTPATIENT_CLINIC_OR_DEPARTMENT_OTHER): Payer: Medicare Other | Admitting: Pharmacist

## 2012-04-09 ENCOUNTER — Other Ambulatory Visit (HOSPITAL_BASED_OUTPATIENT_CLINIC_OR_DEPARTMENT_OTHER): Payer: Medicare Other | Admitting: Lab

## 2012-04-09 DIAGNOSIS — I82409 Acute embolism and thrombosis of unspecified deep veins of unspecified lower extremity: Secondary | ICD-10-CM

## 2012-04-09 DIAGNOSIS — C9000 Multiple myeloma not having achieved remission: Secondary | ICD-10-CM

## 2012-04-09 LAB — CBC WITH DIFFERENTIAL/PLATELET
Eosinophils Absolute: 0.1 10*3/uL (ref 0.0–0.5)
HCT: 40.9 % (ref 38.4–49.9)
LYMPH%: 23.3 % (ref 14.0–49.0)
MONO#: 0.2 10*3/uL (ref 0.1–0.9)
NEUT#: 1.9 10*3/uL (ref 1.5–6.5)
NEUT%: 66.8 % (ref 39.0–75.0)
Platelets: 107 10*3/uL — ABNORMAL LOW (ref 140–400)
WBC: 2.8 10*3/uL — ABNORMAL LOW (ref 4.0–10.3)

## 2012-04-09 LAB — PROTIME-INR

## 2012-04-09 LAB — POCT INR: INR: 2.8

## 2012-04-09 NOTE — Progress Notes (Signed)
INR = 2.8 on 5 mg/day; 2.5 mg MWF No complaints today.  He has been stable on this dose for 1 month so we'll keep dose the same. Return in 1 month. Marily Lente, Pharm.D.

## 2012-04-20 ENCOUNTER — Other Ambulatory Visit: Payer: Self-pay | Admitting: *Deleted

## 2012-04-20 NOTE — Telephone Encounter (Signed)
THIS REQUEST FOR REVLIMID WAS PLACED IN DR.GRANFORTUNA'S ACTIVE WORK INBOX.

## 2012-04-21 ENCOUNTER — Other Ambulatory Visit: Payer: Self-pay | Admitting: *Deleted

## 2012-04-21 MED ORDER — LENALIDOMIDE 5 MG PO CAPS: 5.0000 mg | ORAL_CAPSULE | ORAL | Status: DC

## 2012-04-23 NOTE — Telephone Encounter (Signed)
RECEIVED A FAX FROM [facility] CONCERNING A CONFIRMATION OF PRESCRIPTION SHIPMENT FOR REVLIMID. 

## 2012-05-07 ENCOUNTER — Other Ambulatory Visit (HOSPITAL_BASED_OUTPATIENT_CLINIC_OR_DEPARTMENT_OTHER): Payer: Medicare Other | Admitting: Lab

## 2012-05-07 ENCOUNTER — Ambulatory Visit (HOSPITAL_BASED_OUTPATIENT_CLINIC_OR_DEPARTMENT_OTHER): Payer: Medicare Other | Admitting: Pharmacist

## 2012-05-07 ENCOUNTER — Telehealth: Payer: Self-pay | Admitting: Pharmacist

## 2012-05-07 DIAGNOSIS — C9001 Multiple myeloma in remission: Secondary | ICD-10-CM

## 2012-05-07 DIAGNOSIS — I82409 Acute embolism and thrombosis of unspecified deep veins of unspecified lower extremity: Secondary | ICD-10-CM

## 2012-05-07 DIAGNOSIS — Z7901 Long term (current) use of anticoagulants: Secondary | ICD-10-CM

## 2012-05-07 LAB — COMPREHENSIVE METABOLIC PANEL
Albumin: 4 g/dL (ref 3.5–5.2)
Alkaline Phosphatase: 109 U/L (ref 39–117)
BUN: 20 mg/dL (ref 6–23)
Glucose, Bld: 177 mg/dL — ABNORMAL HIGH (ref 70–99)
Potassium: 3.9 mEq/L (ref 3.5–5.3)

## 2012-05-07 LAB — CBC WITH DIFFERENTIAL/PLATELET
Basophils Absolute: 0 10*3/uL (ref 0.0–0.1)
EOS%: 2.9 % (ref 0.0–7.0)
HCT: 41.2 % (ref 38.4–49.9)
HGB: 13.6 g/dL (ref 13.0–17.1)
MCH: 31.3 pg (ref 27.2–33.4)
MCV: 95 fL (ref 79.3–98.0)
MONO%: 5.4 % (ref 0.0–14.0)
NEUT%: 72.9 % (ref 39.0–75.0)
lymph#: 0.6 10*3/uL — ABNORMAL LOW (ref 0.9–3.3)

## 2012-05-07 LAB — PROTIME-INR
INR: 3.2 (ref 2.00–3.50)
Protime: 38.4 Seconds — ABNORMAL HIGH (ref 10.6–13.4)

## 2012-05-07 LAB — POCT INR: INR: 3.2

## 2012-05-07 NOTE — Progress Notes (Signed)
INR = 3.2 on 5 mg/day; 2.5 mg MWF No complaints re: anticoag. Pt has developed what seems to be upper respiratory tract infection vs. Allergy sxs.  He is seeing his PCP today. I will keep his Coumadin dose the same unless PCP adds abx/steroids.  Pts wife knows to call us if this happens so we can adjust Coumadin dose, if necessary. Return 05/27/12 (same day as appt w/ Misty Stanley). Marily Lente, Pharm.D.

## 2012-05-07 NOTE — Patient Instructions (Signed)
Keep taking the same dose (5 mg daily except 2.5 mg MWF). Call us today if your primary MD gives you any new medications.  Our # is [id number] or [phone].

## 2012-05-07 NOTE — Telephone Encounter (Signed)
I agree with plan  thanks G

## 2012-05-07 NOTE — Telephone Encounter (Signed)
Pt wife called and stated pt saw PCP today and has bronchitis. He was given prescriptions for Albuterol inhaler 2 puffs Q4hprn, Azithromycin 500mg  x 1 day then Azithromycin 250mg  daily x 4 days and Prednisone 10mg  daily x 6 days.  Pt will start these medications on 05/08/12. We will slightly decrease Coumadin dose(since INR borderline high today). Pt will take 2.5mg  today (as scheduled) and 2.5mg  on 5/18 (Saturday) and 5/19 (Sunday).  (He was scheduled to take 5mg  on Sat and Sun). He will then continue his usual dose of 5mg  daily except 2.5mg  on MWF starting on 05/10/12. Pt wife stated clear understanding and repeated instructions to me. They will keep INR appointment on 05/27/12.

## 2012-05-13 ENCOUNTER — Other Ambulatory Visit: Payer: Self-pay | Admitting: *Deleted

## 2012-05-13 MED ORDER — LENALIDOMIDE 5 MG PO CAPS: 5.0000 mg | ORAL_CAPSULE | ORAL | Status: DC

## 2012-05-14 ENCOUNTER — Encounter: Payer: Self-pay | Admitting: *Deleted

## 2012-05-14 NOTE — Progress Notes (Signed)
Fax confirmation from [facility] that Revlimid was shipped 05/13/12.

## 2012-05-27 ENCOUNTER — Ambulatory Visit (HOSPITAL_BASED_OUTPATIENT_CLINIC_OR_DEPARTMENT_OTHER): Payer: Medicare Other | Admitting: Nurse Practitioner

## 2012-05-27 ENCOUNTER — Telehealth: Payer: Self-pay | Admitting: Oncology

## 2012-05-27 ENCOUNTER — Ambulatory Visit: Payer: Medicare Other | Admitting: Pharmacist

## 2012-05-27 ENCOUNTER — Other Ambulatory Visit (HOSPITAL_BASED_OUTPATIENT_CLINIC_OR_DEPARTMENT_OTHER): Payer: Medicare Other | Admitting: Lab

## 2012-05-27 VITALS — BP 103/63 | HR 73 | Temp 96.9°F | Ht 71.0 in | Wt 217.9 lb

## 2012-05-27 DIAGNOSIS — I82409 Acute embolism and thrombosis of unspecified deep veins of unspecified lower extremity: Secondary | ICD-10-CM

## 2012-05-27 DIAGNOSIS — C9000 Multiple myeloma not having achieved remission: Secondary | ICD-10-CM

## 2012-05-27 DIAGNOSIS — E119 Type 2 diabetes mellitus without complications: Secondary | ICD-10-CM

## 2012-05-27 DIAGNOSIS — Z7901 Long term (current) use of anticoagulants: Secondary | ICD-10-CM

## 2012-05-27 DIAGNOSIS — C9002 Multiple myeloma in relapse: Secondary | ICD-10-CM

## 2012-05-27 DIAGNOSIS — Z5181 Encounter for therapeutic drug level monitoring: Secondary | ICD-10-CM

## 2012-05-27 DIAGNOSIS — I824Z9 Acute embolism and thrombosis of unspecified deep veins of unspecified distal lower extremity: Secondary | ICD-10-CM

## 2012-05-27 DIAGNOSIS — C9001 Multiple myeloma in remission: Secondary | ICD-10-CM

## 2012-05-27 DIAGNOSIS — M545 Low back pain: Secondary | ICD-10-CM

## 2012-05-27 LAB — CBC WITH DIFFERENTIAL/PLATELET
Eosinophils Absolute: 0 10*3/uL (ref 0.0–0.5)
LYMPH%: 17.9 % (ref 14.0–49.0)
MONO#: 0.2 10*3/uL (ref 0.1–0.9)
NEUT#: 2.1 10*3/uL (ref 1.5–6.5)
Platelets: 93 10*3/uL — ABNORMAL LOW (ref 140–400)
RBC: 4.45 10*6/uL (ref 4.20–5.82)
RDW: 15.9 % — ABNORMAL HIGH (ref 11.0–14.6)
WBC: 3 10*3/uL — ABNORMAL LOW (ref 4.0–10.3)
lymph#: 0.5 10*3/uL — ABNORMAL LOW (ref 0.9–3.3)
nRBC: 0 % (ref 0–0)

## 2012-05-27 LAB — PROTIME-INR

## 2012-05-27 LAB — PROTHROMBIN TIME: Prothrombin Time: 36.2 seconds — ABNORMAL HIGH (ref 11.6–15.2)

## 2012-05-27 NOTE — Progress Notes (Signed)
OFFICE PROGRESS NOTE  Interval history:  [name] [name] is a 71 year old man with lambda light chain multiple myeloma. Revlimid was resumed at a dose of 5 mg daily 3 weeks on/1 week rest following his office visit 03/26/2012.  He denies nausea/vomiting. No mouth sores. His wife notes that his appetite is poor in the mornings and generally improves as the day progresses. He was recently treated for "bronchitis". He continues to have intermittent pain at the mid to low back. The pain worsens when he bends. He does not have pain at rest. His wife has noted improvement in tremors since he began treatment of the Parkinson's.  Most recent myeloma laboratory reevaluation done 03/26/2012 showed serum kappa and lambda free light chains in normal range with a normal ratio, normal serum immunoglobulins.   Objective: Blood pressure 103/63, pulse 73, temperature 96.9 F (36.1 C), temperature source Oral, height 5\' 11"  (1.803 m), weight 217 lb 14.4 oz (98.839 kg).  Oropharynx is without thrush or ulceration. Mucous membranes are pink and moist. No palpable cervical or supraclavicular lymph nodes. Lungs clear. No wheezes or rales. Regular cardiac rhythm. Abdomen is soft and nontender. No organomegaly. Extremities without edema. Motor and 5 over 5. Knee DTRs absent symmetrically. Vibratory sense markedly decreased over the fingertips per tuning fork exam.   Lab Results: Lab Results  Component Value Date   WBC 3.0* 05/27/2012   HGB 14.1 05/27/2012   HCT 42.3 05/27/2012   MCV 95.0 05/27/2012   PLT 93* 05/27/2012    Chemistry:    Chemistry      Component Value Date/Time   NA 139 05/07/2012 1054   NA 145 06/13/2009 1503   K 3.9 05/07/2012 1054   K 4.8* 06/13/2009 1503   CL 103 05/07/2012 1054   CL 108 06/13/2009 1503   CO2 26 05/07/2012 1054   CO2 29 06/13/2009 1503   BUN 20 05/07/2012 1054   BUN 49* 06/13/2009 1503   CREATININE 1.65* 05/07/2012 1054   CREATININE 1.41* 08/26/2011 1140   CREATININE 1.41* 08/26/2011 1140   CREATININE 1.41* 08/26/2011 1140   CREATININE 1.41* 08/26/2011 1140   CREATININE 1.41* 08/26/2011 1140   CREATININE 2.9* 06/13/2009 1503      Component Value Date/Time   CALCIUM 8.0* 05/07/2012 1054   CALCIUM 10.5* 06/13/2009 1503   ALKPHOS 109 05/07/2012 1054   ALKPHOS 135* 06/13/2009 1503   AST 16 05/07/2012 1054   AST 21 06/13/2009 1503   ALT <8 05/07/2012 1054   BILITOT 0.7 05/07/2012 1054   BILITOT 0.60 06/13/2009 1503       Studies/Results: No results found.  Medications: I have reviewed the patient's current medications.  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 He was found to have multiple lytic bone lesions. Serum immunoglobulins were all suppressed. He had monoclonal lambda light chains on immunofixation electrophoresis. There was marked elevation of serum free lambda light chains at 3.5 g. Initial urine showed 6.7 g of protein, primarily free lambda light chains. Bone marrow biopsy showed 87% plasma cells. He was induced into a remission with a combination of Velcade plus dexamethasone with a subsequent addition of Revlimid. Velcade was discontinued due to progressive neuropathy. He went on to receive IV melphalan with autologous bone marrow support at [facility] on 03/12/2010. He remained off of treatment until a small amount of monoclonal lambda free light chain was again found in the urine in March 2012. He was started on Revlimid  10 mg daily beginning 04/09/2011. The Revlimid schedule was changed to 21 days on followed by a 7 day break at the same dose of 10 mg in August 2012 due to myelosuppression. Due to myelosuppression the Revlimid dose was decreased to 5 mg daily 3 weeks on/1 week off following an office visit on 03/26/2012. 2. Type 2 diabetes: He continues metformin. 3. Right lower extremity deep vein thrombosis 05/14/2011: He continues full-dose Coumadin anticoagulation. 4. 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 He continues Lyrica. 5. Essential hypertension. Controlled on current medication. 6. Parkinson's disease.  7. Recent bronchitis. 8.  Hyperlipidemia. 9. Degenerative arthritis. 10. Chronic renal insufficiency.  11. 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   Disposition-Mr. Beezley appears stable. He will continue Revlimid at the current dose/schedule. We will followup on the myeloma labs from today. He will return for a followup visit in 2 months. He will contact the office in the interim with any problems.   Lonna Cobb ANP/GNP-BC

## 2012-05-27 NOTE — Telephone Encounter (Signed)
Gv pt appt for july2013 °

## 2012-05-27 NOTE — Progress Notes (Signed)
INR supratherapeutic today (3.57, after being sent out).  No missed doses.  No problems with bleeding or bruising.  No recent changes in meds or diet.  He has been off the antibiotic and steroid regimen for almost 2 weeks now.  Will hold 1 dose, then have pt resume Coumadin at 5mg  daily except 2.5mg  on MWF.  Will recheck INR in 1 week to ensure that pt returns to therapeutic range.

## 2012-05-28 ENCOUNTER — Ambulatory Visit: Payer: Medicare Other | Admitting: Nurse Practitioner

## 2012-05-28 ENCOUNTER — Other Ambulatory Visit: Payer: Medicare Other | Admitting: Lab

## 2012-05-31 LAB — COMPREHENSIVE METABOLIC PANEL
ALT: <8 U/L (ref 0–53)
Albumin: 4 g/dL (ref 3.5–5.2)
CO2: 28 mEq/L (ref 19–32)
Calcium: 8.5 mg/dL (ref 8.4–10.5)
Chloride: 105 mEq/L (ref 96–112)
Potassium: 4 mEq/L (ref 3.5–5.3)
Sodium: 140 mEq/L (ref 135–145)
Total Protein: 5.9 g/dL — ABNORMAL LOW (ref 6.0–8.3)

## 2012-05-31 LAB — IMMUNOFIXATION ELECTROPHORESIS: Total Protein, Serum Electrophoresis: 5.9 g/dL — ABNORMAL LOW (ref 6.0–8.3)

## 2012-05-31 LAB — KAPPA/LAMBDA LIGHT CHAINS
Kappa:Lambda Ratio: 0.48 (ref 0.26–1.65)
Lambda Free Lght Chn: 3.25 mg/dL — ABNORMAL HIGH (ref 0.57–2.63)

## 2012-06-03 ENCOUNTER — Telehealth: Payer: Self-pay | Admitting: *Deleted

## 2012-06-03 ENCOUNTER — Ambulatory Visit (HOSPITAL_BASED_OUTPATIENT_CLINIC_OR_DEPARTMENT_OTHER): Payer: Medicare Other | Admitting: Pharmacist

## 2012-06-03 ENCOUNTER — Other Ambulatory Visit (HOSPITAL_BASED_OUTPATIENT_CLINIC_OR_DEPARTMENT_OTHER): Payer: Medicare Other | Admitting: Lab

## 2012-06-03 DIAGNOSIS — C9000 Multiple myeloma not having achieved remission: Secondary | ICD-10-CM

## 2012-06-03 DIAGNOSIS — I82409 Acute embolism and thrombosis of unspecified deep veins of unspecified lower extremity: Secondary | ICD-10-CM

## 2012-06-03 DIAGNOSIS — I824Z9 Acute embolism and thrombosis of unspecified deep veins of unspecified distal lower extremity: Secondary | ICD-10-CM

## 2012-06-03 LAB — CBC WITH DIFFERENTIAL/PLATELET
BASO%: 0.9 % (ref 0.0–2.0)
EOS%: 2.4 % (ref 0.0–7.0)
LYMPH%: 19.4 % (ref 14.0–49.0)
MCH: 31.3 pg (ref 27.2–33.4)
MCHC: 34.4 g/dL (ref 32.0–36.0)
MONO#: 0.2 10*3/uL (ref 0.1–0.9)
MONO%: 4.8 % (ref 0.0–14.0)
Platelets: 104 10*3/uL — ABNORMAL LOW (ref 140–400)
RBC: 4.54 10*6/uL (ref 4.20–5.82)
WBC: 3.4 10*3/uL — ABNORMAL LOW (ref 4.0–10.3)
nRBC: 0 % (ref 0–0)

## 2012-06-03 LAB — PROTIME-INR

## 2012-06-03 NOTE — Telephone Encounter (Signed)
Pt's wife called yest for lab results.  Discussed with Dr Cyndie Chime & wife notified that labs stable, Myeloma labs OK.

## 2012-06-03 NOTE — Progress Notes (Signed)
Continue usual dose of 5mg daily except 2.5mg on MWF.   Will recheck INR in ~ 10days. 

## 2012-06-04 ENCOUNTER — Other Ambulatory Visit: Payer: Self-pay | Admitting: *Deleted

## 2012-06-04 MED ORDER — LENALIDOMIDE 5 MG PO CAPS: 5.0000 mg | ORAL_CAPSULE | Freq: Every day | ORAL | Status: DC

## 2012-06-04 NOTE — Telephone Encounter (Signed)
Called and spoke with wife, reminded her it is time for patient to take another survey for revlimid.

## 2012-06-08 ENCOUNTER — Encounter: Payer: Self-pay | Admitting: *Deleted

## 2012-06-08 NOTE — Progress Notes (Signed)
RECEIVED A FAX FROM [facility] CONCERNING A CONFIRMATION OF PRESCRIPTION SHIPMENT FOR REVLIMID. 

## 2012-06-09 NOTE — Patient Instructions (Signed)
Continue usual dose of 5mg  daily except 2.5mg  on MWF.   Will recheck INR in ~ 10days.

## 2012-06-15 ENCOUNTER — Ambulatory Visit (HOSPITAL_BASED_OUTPATIENT_CLINIC_OR_DEPARTMENT_OTHER): Payer: Medicare Other | Admitting: Pharmacist

## 2012-06-15 ENCOUNTER — Other Ambulatory Visit (HOSPITAL_BASED_OUTPATIENT_CLINIC_OR_DEPARTMENT_OTHER): Payer: Medicare Other | Admitting: Lab

## 2012-06-15 DIAGNOSIS — C9000 Multiple myeloma not having achieved remission: Secondary | ICD-10-CM

## 2012-06-15 DIAGNOSIS — I82409 Acute embolism and thrombosis of unspecified deep veins of unspecified lower extremity: Secondary | ICD-10-CM

## 2012-06-15 DIAGNOSIS — I824Z9 Acute embolism and thrombosis of unspecified deep veins of unspecified distal lower extremity: Secondary | ICD-10-CM

## 2012-06-15 LAB — CBC WITH DIFFERENTIAL/PLATELET
BASO%: 1.8 % (ref 0.0–2.0)
EOS%: 2.8 % (ref 0.0–7.0)
HCT: 41 % (ref 38.4–49.9)
LYMPH%: 23 % (ref 14.0–49.0)
MCH: 31 pg (ref 27.2–33.4)
MCHC: 33.9 g/dL (ref 32.0–36.0)
MCV: 91.3 fL (ref 79.3–98.0)
MONO#: 0.3 10*3/uL (ref 0.1–0.9)
MONO%: 11 % (ref 0.0–14.0)
NEUT%: 61.4 % (ref 39.0–75.0)
Platelets: 64 10*3/uL — ABNORMAL LOW (ref 140–400)
RBC: 4.49 10*6/uL (ref 4.20–5.82)
WBC: 2.8 10*3/uL — ABNORMAL LOW (ref 4.0–10.3)

## 2012-06-15 LAB — PROTIME-INR: Protime: 43.2 Seconds — ABNORMAL HIGH (ref 10.6–13.4)

## 2012-06-15 NOTE — Progress Notes (Signed)
INR = 3.6 on 5 mg/day; 2.5 mg MWF No bleeding/bruising per pt. Pltc is low today.  Finishing this cycle of Revlimid today so Pltc should begin to recover soon. INR has been >3 for the past 4 visits on his current dose.  I will reduce his overall dose by 25%--> 2.5 mg/day; 5 mg Fri. Repeat protime next Tues. Cautioned pt about risk of bleeding w/ supratherapeutic INR & low Pltc.   Marily Lente, Pharm.D.

## 2012-06-17 ENCOUNTER — Telehealth: Payer: Self-pay | Admitting: *Deleted

## 2012-06-17 NOTE — Telephone Encounter (Signed)
Called & spoke with pt's wife about revlimid dose.  She reports that she has the new script at home but is on 7 day break & he is due to start 06/23/12.  He has a lab appt 06/22/12 & instructed to not take revlimid until we see labs & give OK.  She expresses understanding.

## 2012-06-22 ENCOUNTER — Other Ambulatory Visit (HOSPITAL_BASED_OUTPATIENT_CLINIC_OR_DEPARTMENT_OTHER): Payer: Medicare Other | Admitting: Lab

## 2012-06-22 ENCOUNTER — Ambulatory Visit (HOSPITAL_BASED_OUTPATIENT_CLINIC_OR_DEPARTMENT_OTHER): Payer: Medicare Other | Admitting: Pharmacist

## 2012-06-22 DIAGNOSIS — I82409 Acute embolism and thrombosis of unspecified deep veins of unspecified lower extremity: Secondary | ICD-10-CM

## 2012-06-22 DIAGNOSIS — I824Z9 Acute embolism and thrombosis of unspecified deep veins of unspecified distal lower extremity: Secondary | ICD-10-CM

## 2012-06-22 DIAGNOSIS — C9002 Multiple myeloma in relapse: Secondary | ICD-10-CM

## 2012-06-22 DIAGNOSIS — C9000 Multiple myeloma not having achieved remission: Secondary | ICD-10-CM

## 2012-06-22 LAB — CBC WITH DIFFERENTIAL/PLATELET
BASO%: 0.9 % (ref 0.0–2.0)
Basophils Absolute: 0 10*3/uL (ref 0.0–0.1)
EOS%: 1.8 % (ref 0.0–7.0)
HCT: 40.3 % (ref 38.4–49.9)
HGB: 13.8 g/dL (ref 13.0–17.1)
LYMPH%: 24.5 % (ref 14.0–49.0)
MCH: 31.4 pg (ref 27.2–33.4)
MCHC: 34.2 g/dL (ref 32.0–36.0)
MCV: 91.8 fL (ref 79.3–98.0)
NEUT%: 65.2 % (ref 39.0–75.0)
Platelets: 93 10*3/uL — ABNORMAL LOW (ref 140–400)
lymph#: 0.8 10*3/uL — ABNORMAL LOW (ref 0.9–3.3)

## 2012-06-22 LAB — PROTIME-INR: INR: 2.4 (ref 2.00–3.50)

## 2012-06-22 NOTE — Progress Notes (Signed)
INR = 2.4 on 2.5 mg/day; 5 mg on Friday. Pt doing well.  CBC improving after a break from Revlimid.  Awaiting word today whether he can resume his Revlimid this week. Pt has small cut from shaving on his L cheekbone.  Minor bleeding.  Has a scab on the cut now. No change necessary to Coumadin dose. Return at the end of the month w/ MD visit. Marily Lente, Pharm.D.

## 2012-06-23 ENCOUNTER — Telehealth: Payer: Self-pay | Admitting: *Deleted

## 2012-06-23 ENCOUNTER — Other Ambulatory Visit: Payer: Self-pay | Admitting: Oncology

## 2012-06-23 NOTE — Telephone Encounter (Signed)
Notified pt's wife, OK to restart revlimid per Dr. Cyndie Chime.

## 2012-06-28 ENCOUNTER — Other Ambulatory Visit: Payer: Self-pay | Admitting: *Deleted

## 2012-06-28 NOTE — Telephone Encounter (Signed)
THIS REFILL REQUEST FOR REVLIMID WAS PLACED IN DR.GRANFORTUNA'S ACTIVE WORK BOX. 

## 2012-07-08 ENCOUNTER — Other Ambulatory Visit: Payer: Self-pay | Admitting: *Deleted

## 2012-07-08 DIAGNOSIS — G629 Polyneuropathy, unspecified: Secondary | ICD-10-CM

## 2012-07-08 DIAGNOSIS — C9000 Multiple myeloma not having achieved remission: Secondary | ICD-10-CM

## 2012-07-08 MED ORDER — LENALIDOMIDE 5 MG PO CAPS: 5.0000 mg | ORAL_CAPSULE | Freq: Every day | ORAL | Status: DC

## 2012-07-08 MED ORDER — PREGABALIN 100 MG PO CAPS: 100.0000 mg | ORAL_CAPSULE | Freq: Every day | ORAL | Status: DC

## 2012-07-20 ENCOUNTER — Other Ambulatory Visit (HOSPITAL_BASED_OUTPATIENT_CLINIC_OR_DEPARTMENT_OTHER): Payer: Medicare Other | Admitting: Lab

## 2012-07-20 ENCOUNTER — Telehealth: Payer: Self-pay | Admitting: Oncology

## 2012-07-20 ENCOUNTER — Ambulatory Visit (HOSPITAL_BASED_OUTPATIENT_CLINIC_OR_DEPARTMENT_OTHER): Payer: Medicare Other | Admitting: Pharmacist

## 2012-07-20 ENCOUNTER — Ambulatory Visit (HOSPITAL_BASED_OUTPATIENT_CLINIC_OR_DEPARTMENT_OTHER): Payer: Medicare Other | Admitting: Oncology

## 2012-07-20 VITALS — BP 116/75 | HR 70 | Temp 97.4°F | Ht 71.0 in | Wt 219.6 lb

## 2012-07-20 DIAGNOSIS — I82409 Acute embolism and thrombosis of unspecified deep veins of unspecified lower extremity: Secondary | ICD-10-CM

## 2012-07-20 DIAGNOSIS — N189 Chronic kidney disease, unspecified: Secondary | ICD-10-CM

## 2012-07-20 DIAGNOSIS — C9002 Multiple myeloma in relapse: Secondary | ICD-10-CM

## 2012-07-20 DIAGNOSIS — G20 Parkinson's disease: Secondary | ICD-10-CM

## 2012-07-20 DIAGNOSIS — C9000 Multiple myeloma not having achieved remission: Secondary | ICD-10-CM

## 2012-07-20 LAB — PROTIME-INR
INR: 2 (ref 2.00–3.50)
Protime: 24 Seconds — ABNORMAL HIGH (ref 10.6–13.4)

## 2012-07-20 LAB — CBC WITH DIFFERENTIAL/PLATELET
Basophils Absolute: 0 10*3/uL (ref 0.0–0.1)
Eosinophils Absolute: 0 10*3/uL (ref 0.0–0.5)
HGB: 14.2 g/dL (ref 13.0–17.1)
LYMPH%: 19.5 % (ref 14.0–49.0)
MCV: 96.3 fL (ref 79.3–98.0)
MONO#: 0.3 10*3/uL (ref 0.1–0.9)
NEUT#: 2.2 10*3/uL (ref 1.5–6.5)
Platelets: 102 10*3/uL — ABNORMAL LOW (ref 140–400)
RBC: 4.46 10*6/uL (ref 4.20–5.82)
RDW: 16.3 % — ABNORMAL HIGH (ref 11.0–14.6)
WBC: 3.1 10*3/uL — ABNORMAL LOW (ref 4.0–10.3)

## 2012-07-20 NOTE — Telephone Encounter (Signed)
Gave pt appt for October and November 2013 lab and scan then see MD after a few days

## 2012-07-20 NOTE — Progress Notes (Signed)
No changes to report. Pt is taking 2.5 mg daily with 5 mg on Friday.  Will continue same dose.  Pt will be seen by provider today as well.

## 2012-07-20 NOTE — Progress Notes (Signed)
CC:   [name] [name]. Polite, M.D. Verita Schneiders, MD  Followup visit for this 71 year old man diagnosed with lambda light chain myeloma in June 2010 when he presented with anemia and multiple symptomatic bone lesions.  A 24-hour urine showed 6.7 grams of protein. This was primarily free lambda light chains.  Serum immunoglobulins were all suppressed.Serum free lambda light chains markedly elevated at 3.5 grams   Bone marrow biopsy with 87% plasma cells.  He was induced into remission with a combination of Velcade plus dexamethasone with subsequent addition of Revlimid.  Velcadesubsequently stopped due to progressive distal neuropathy.  He did achieve an excellent response and went on to receive high-dose IV melphalan with autologous bone marrow support at [facility] on March 12, 2010.  A small amount of monoclonal lambda free light chain appeared in the urine on a routine specimen done in March 2012.  He was started on Revlimid 10 mg daily beginning April 09, 2011.  Due to myelosuppression, he has had to have subsequent dose reductions and current dose is now 5 mg, 3 weeks on, 1 week rest.  Total protein in the urine, and I still do not trust the technique being done, was 300 mg at time of re-initiation of the Revlimid in March. Next recorded value from July 2012 was 1258 mg.  There has been a steady decline since that time with most recent value of 251 mg recorded on March 31, 2012.  Lambda free light chains came down to normal with induction RVD and melphalan chemo, have fluctuated since going back on Revlimid in March without any significant trend for increasing.  Value at that time was 3.95 mg% with peak value over the last year up to 7.9 in May 2012 correlating with the peak urine protein values.  We have had values as low was 1.8 mg% as of April 2013 and most recent value was 3.25 mg% on May 27, 2012.  Today's values are pending.  He continues to have chronic, poorly  localized right low paraspinal pain radiating anteriorly.  He has had significant decrease in tremors of his right hand since starting antiparkinson medication.  He has noted increased pigmentation of the skin of his feet.  He is still getting some paresthesias of his feet.  EXAM:  Well-nourished African American man.  Vital signs:  Blood pressure 116/75, pulse 70 and regular, temp 97.4, respirations 18. Weight is 220 pounds which is stable.  He is 5 feet 11 inches tall. Skin:  There is increased pigmentation diffusely on the skin of his feet.  No other rash or ecchymosis.  Pharynx:  No erythema or exudate. Lungs:  Clear and resonant to percussion.  Regular cardiac rhythm.  No murmur.  No lymphadenopathy.  Abdomen:  Soft, nontender, no mass, no organomegaly.  Extremities:  With trace edema.  Neurologic:  With mental status intact, cranial nerves intact.  Motor strength 5/5.  Reflexes absent but symmetric.  He has minimal decrease in vibration sense by tuning fork exam over the fingertips and no decrease in vibration sensation over his feet.  LABS:  Hemoglobin 14, hematocrit 43, white count 3100, 69% neutrophils, platelet count 102,000.  Prothrombin time 24 seconds, INR 2.0 on Coumadin 5 mg on Fridays and 2.5 mg on the other days of the week.  Chemistry profile and paraprotein levels are pending.  IMPRESSION: 1. Lambda light chain multiple myeloma.  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  Plan:  Continue low-dose     Revlimid 5 mg 21 days on, 7 days rest.  I reviewed with him and his     wife the risk/benefit of long-term Revlimid.  We have been seeing     some 2nd cancers.  In general, at this point in time most experts     are recommending continuing the Revlimid with close clinical     observation since the  risk of relapsed myeloma is much higher than     the risk of a 2nd malignancy. 2. Type 2 diabetes on oral agent. 3. Right lower extremity deep venous thrombosis May 2012.  He     continues on full-dose Coumadin anticoagulation. 4. Diabetic peripheral neuropathy with possible contribution from     previous Velcade chemotherapy.  This seems to be objectively     significantly improved over time. 5. Essential hypertension. 6. Probable early Parkinson disease with improvement in motor function     on antiparkinson drugs. 7. Degenerative arthritis. 8. Chronic mild renal insufficiency. 9. History of herpes zoster affecting a cervical dermatome following     his bone marrow transplant in 2011.    ______________________________ Levert Feinstein, M.D., F.A.C.P. JMG/MEDQ  D:  07/20/2012  T:  07/20/2012  Job:  [id number]

## 2012-07-20 NOTE — Patient Instructions (Signed)
No changes to report. Pt is taking 2.5 mg daily with 5 mg on Friday.  Will continue same dose.  Pt will be seen by provider today as well. 

## 2012-07-22 ENCOUNTER — Ambulatory Visit: Payer: Medicare Other | Admitting: Lab

## 2012-07-22 DIAGNOSIS — C9002 Multiple myeloma in relapse: Secondary | ICD-10-CM

## 2012-07-22 DIAGNOSIS — C9001 Multiple myeloma in remission: Secondary | ICD-10-CM

## 2012-07-22 LAB — KAPPA/LAMBDA LIGHT CHAINS: Kappa:Lambda Ratio: 0.21 — ABNORMAL LOW (ref 0.26–1.65)

## 2012-07-22 LAB — IMMUNOFIXATION ELECTROPHORESIS
IgG (Immunoglobin G), Serum: 624 mg/dL — ABNORMAL LOW (ref 650–1600)
Total Protein, Serum Electrophoresis: 6 g/dL (ref 6.0–8.3)

## 2012-07-22 LAB — COMPREHENSIVE METABOLIC PANEL
Albumin: 4.2 g/dL (ref 3.5–5.2)
BUN: 12 mg/dL (ref 6–23)
CO2: 26 mEq/L (ref 19–32)
Calcium: 8.8 mg/dL (ref 8.4–10.5)
Glucose, Bld: 222 mg/dL — ABNORMAL HIGH (ref 70–99)
Potassium: 4.1 mEq/L (ref 3.5–5.3)
Sodium: 139 mEq/L (ref 135–145)
Total Protein: 6 g/dL (ref 6.0–8.3)

## 2012-07-22 LAB — LACTATE DEHYDROGENASE: LDH: 135 U/L (ref 94–250)

## 2012-07-26 LAB — UIFE/LIGHT CHAINS/TP QN, 24-HR UR
Albumin, U: DETECTED
Beta, Urine: DETECTED — AB
Free Kappa Lt Chains,Ur: 9.1 mg/dL — ABNORMAL HIGH (ref 0.14–2.42)
Free Lambda Lt Chains,Ur: 2.71 mg/dL — ABNORMAL HIGH (ref 0.02–0.67)
Free Lt Chn Excr Rate: 191.1 mg/d
Gamma Globulin, Urine: DETECTED — AB
Time: 24 hours
Volume, Urine: 2100 mL

## 2012-07-26 LAB — CREATININE CLEARANCE, URINE, 24 HOUR
Creatinine Clearance: 102 mL/min (ref 75–125)
Creatinine, 24H Ur: 1949 mg/d (ref 800–2000)
Creatinine, Urine: 92.8 mg/dL
Creatinine: 1.33 mg/dL (ref 0.50–1.35)

## 2012-08-02 ENCOUNTER — Other Ambulatory Visit: Payer: Self-pay | Admitting: *Deleted

## 2012-08-02 DIAGNOSIS — C9000 Multiple myeloma not having achieved remission: Secondary | ICD-10-CM

## 2012-08-02 MED ORDER — LENALIDOMIDE 5 MG PO CAPS: 5.0000 mg | ORAL_CAPSULE | Freq: Every day | ORAL | Status: DC

## 2012-08-02 NOTE — Telephone Encounter (Signed)
THIS REFILL REQUEST FOR REVLIMID WAS PLACED IN DR.GRANFORTUNA'S ACTIVE WORK BOX. 

## 2012-08-17 ENCOUNTER — Other Ambulatory Visit (HOSPITAL_BASED_OUTPATIENT_CLINIC_OR_DEPARTMENT_OTHER): Payer: Medicare Other | Admitting: Lab

## 2012-08-17 ENCOUNTER — Ambulatory Visit (HOSPITAL_BASED_OUTPATIENT_CLINIC_OR_DEPARTMENT_OTHER): Payer: Medicare Other | Admitting: Pharmacist

## 2012-08-17 DIAGNOSIS — I82409 Acute embolism and thrombosis of unspecified deep veins of unspecified lower extremity: Secondary | ICD-10-CM

## 2012-08-17 DIAGNOSIS — I824Z9 Acute embolism and thrombosis of unspecified deep veins of unspecified distal lower extremity: Secondary | ICD-10-CM

## 2012-08-17 DIAGNOSIS — Z7901 Long term (current) use of anticoagulants: Secondary | ICD-10-CM

## 2012-08-17 LAB — PROTIME-INR: INR: 2.3 (ref 2.00–3.50)

## 2012-08-17 NOTE — Progress Notes (Signed)
INR therapeutic today (2.3) on 2.5mg  daily except 5mg  on F. No changes in meds, diet.  No problems to report.  Pt doing well. Will continue current dose and recheck INR in 1 month.

## 2012-09-06 ENCOUNTER — Other Ambulatory Visit: Payer: Self-pay | Admitting: *Deleted

## 2012-09-06 DIAGNOSIS — C9000 Multiple myeloma not having achieved remission: Secondary | ICD-10-CM

## 2012-09-06 NOTE — Telephone Encounter (Signed)
THIS REFILL REQUEST FOR REVLIMID WAS PLACED IN DR.GRANFORTUNA'S ACTIVE WORK BOX. 

## 2012-09-07 MED ORDER — LENALIDOMIDE 5 MG PO CAPS: 5.0000 mg | ORAL_CAPSULE | Freq: Every day | ORAL | Status: DC

## 2012-09-07 NOTE — Addendum Note (Signed)
Addended by: Arvilla Meres on: 09/07/2012 05:10 PM   Modules accepted: Orders

## 2012-09-08 ENCOUNTER — Telehealth: Payer: Self-pay | Admitting: *Deleted

## 2012-09-08 NOTE — Telephone Encounter (Signed)
Wife called reporting [name] [name] was having trouble getting out of the car.  Report he started an antibiotic prescribed by PCP yesterday for sore throat/cold.  Today he is sitting in the car and could not get out.  Asked how to proceed.  Unable to accurately check temperature while this nurse on hold.  Reports temp = 96.3 but I can't get it to zero.  Spoke with Molly Maduro who denies any other symptoms.  ATE OATMEAL FOR BREAKFAST AT 12:00 PM AND HASN'T EATEN AGAIN.  Sat in the car a few hours and feels he was just having trouble moving after sitting too long.  Instructed to eat, FF continue antibiotic, rest, obtain new thermometer battery perhaps and to report to ER if fever > 100.5 with chills.  Verbalized understanding.

## 2012-09-14 ENCOUNTER — Ambulatory Visit (HOSPITAL_BASED_OUTPATIENT_CLINIC_OR_DEPARTMENT_OTHER): Payer: Medicare Other | Admitting: Pharmacist

## 2012-09-14 ENCOUNTER — Other Ambulatory Visit (HOSPITAL_BASED_OUTPATIENT_CLINIC_OR_DEPARTMENT_OTHER): Payer: Medicare Other | Admitting: Lab

## 2012-09-14 DIAGNOSIS — I824Z9 Acute embolism and thrombosis of unspecified deep veins of unspecified distal lower extremity: Secondary | ICD-10-CM

## 2012-09-14 DIAGNOSIS — I82409 Acute embolism and thrombosis of unspecified deep veins of unspecified lower extremity: Secondary | ICD-10-CM

## 2012-09-14 DIAGNOSIS — Z7901 Long term (current) use of anticoagulants: Secondary | ICD-10-CM

## 2012-09-14 LAB — PROTIME-INR

## 2012-09-14 NOTE — Patient Instructions (Addendum)
Take 5mg  today.  On 09/15/12, continue 2.5 mg/day; except 5mg  Fridays. Return 09/22/12 at 11:00 am for lab; 11:15 am for Coumadin clinic.

## 2012-09-14 NOTE — Progress Notes (Signed)
Take 5mg today.  On 09/15/12, continue 2.5 mg/day; except 5mg Fridays. Return 09/22/12 at 11:00 am for lab; 11:15 am for Coumadin clinic. 

## 2012-09-17 ENCOUNTER — Other Ambulatory Visit: Payer: Self-pay | Admitting: Pharmacist

## 2012-09-17 ENCOUNTER — Other Ambulatory Visit: Payer: Self-pay | Admitting: *Deleted

## 2012-09-17 NOTE — Telephone Encounter (Signed)
Refill for Coumadin 5mg  tablets faxed to [facility].  Fax#[phone] Coumadin 5mg  tablets Take 1/2 tablet PO daily except 1 tablet on Friday or as directed.  #30, 3 RF

## 2012-09-22 ENCOUNTER — Other Ambulatory Visit (HOSPITAL_BASED_OUTPATIENT_CLINIC_OR_DEPARTMENT_OTHER): Payer: Medicare Other | Admitting: Lab

## 2012-09-22 ENCOUNTER — Ambulatory Visit (HOSPITAL_BASED_OUTPATIENT_CLINIC_OR_DEPARTMENT_OTHER): Payer: Medicare Other | Admitting: Pharmacist

## 2012-09-22 DIAGNOSIS — N189 Chronic kidney disease, unspecified: Secondary | ICD-10-CM

## 2012-09-22 DIAGNOSIS — I129 Hypertensive chronic kidney disease with stage 1 through stage 4 chronic kidney disease, or unspecified chronic kidney disease: Secondary | ICD-10-CM

## 2012-09-22 DIAGNOSIS — I82409 Acute embolism and thrombosis of unspecified deep veins of unspecified lower extremity: Secondary | ICD-10-CM

## 2012-09-22 DIAGNOSIS — Z7901 Long term (current) use of anticoagulants: Secondary | ICD-10-CM

## 2012-09-22 DIAGNOSIS — Z5181 Encounter for therapeutic drug level monitoring: Secondary | ICD-10-CM

## 2012-09-22 LAB — POCT INR: INR: 2

## 2012-09-22 LAB — PROTIME-INR: INR: 2 (ref 2.00–3.50)

## 2012-09-22 NOTE — Patient Instructions (Signed)
Take 5mg  on Wed and Fri and 2.5 mg other days. Return to clinic on 10/19/12 for lab at 12:30 am for Coumadin clinic at 12:45

## 2012-09-22 NOTE — Progress Notes (Signed)
Take 5mg  on Wed and Fri and 2.5 mg other days. Return to clinic on 10/19/12 for lab at 12:30 am for Coumadin clinic at 12:45.  Pt will call us if he goes to MD before Oct 29 and is placed on any ABX.

## 2012-10-01 ENCOUNTER — Other Ambulatory Visit: Payer: Self-pay | Admitting: *Deleted

## 2012-10-01 NOTE — Telephone Encounter (Signed)
THIS REFILL REQUEST WAS PLACED IN DR.GRANFORTUNA'S ACTIVE WORK BOX. 

## 2012-10-04 ENCOUNTER — Other Ambulatory Visit: Payer: Self-pay | Admitting: *Deleted

## 2012-10-04 DIAGNOSIS — C9000 Multiple myeloma not having achieved remission: Secondary | ICD-10-CM

## 2012-10-04 MED ORDER — LENALIDOMIDE 5 MG PO CAPS: 5.0000 mg | ORAL_CAPSULE | Freq: Every day | ORAL | Status: DC

## 2012-10-19 ENCOUNTER — Other Ambulatory Visit (HOSPITAL_BASED_OUTPATIENT_CLINIC_OR_DEPARTMENT_OTHER): Payer: Medicare Other | Admitting: Lab

## 2012-10-19 ENCOUNTER — Ambulatory Visit (HOSPITAL_BASED_OUTPATIENT_CLINIC_OR_DEPARTMENT_OTHER): Payer: Medicare Other | Admitting: Pharmacist

## 2012-10-19 ENCOUNTER — Ambulatory Visit (HOSPITAL_COMMUNITY)
Admission: RE | Admit: 2012-10-19 | Discharge: 2012-10-19 | Disposition: A | Discharge status: Home / Self Care | Payer: Medicare Other | Source: Ambulatory Visit | Attending: Oncology | Admitting: Oncology

## 2012-10-19 DIAGNOSIS — I82409 Acute embolism and thrombosis of unspecified deep veins of unspecified lower extremity: Secondary | ICD-10-CM

## 2012-10-19 DIAGNOSIS — C9002 Multiple myeloma in relapse: Secondary | ICD-10-CM | POA: Insufficient documentation

## 2012-10-19 DIAGNOSIS — Z7901 Long term (current) use of anticoagulants: Secondary | ICD-10-CM

## 2012-10-19 DIAGNOSIS — C9000 Multiple myeloma not having achieved remission: Secondary | ICD-10-CM

## 2012-10-19 LAB — CBC WITH DIFFERENTIAL/PLATELET
EOS%: 1.8 % (ref 0.0–7.0)
Eosinophils Absolute: 0.1 10*3/uL (ref 0.0–0.5)
MCH: 32 pg (ref 27.2–33.4)
MCV: 95.2 fL (ref 79.3–98.0)
MONO%: 4.2 % (ref 0.0–14.0)
NEUT#: 2 10*3/uL (ref 1.5–6.5)
RBC: 4.31 10*6/uL (ref 4.20–5.82)
RDW: 14.8 % — ABNORMAL HIGH (ref 11.0–14.6)

## 2012-10-19 LAB — COMPREHENSIVE METABOLIC PANEL (CC13)
BUN: 14 mg/dL (ref 7.0–26.0)
CO2: 29 mEq/L (ref 22–29)
Calcium: 9 mg/dL (ref 8.4–10.4)
Chloride: 106 mEq/L (ref 98–107)
Creatinine: 1.4 mg/dL — ABNORMAL HIGH (ref 0.7–1.3)

## 2012-10-19 NOTE — Progress Notes (Signed)
INR therapeutic today (2.2) on 2.5mg  daily except 5mg  on WF.   No changes in meds or diet.  No problems with bleeding or bruising.   Will continue current dose and recheck INR in 6 weeks.

## 2012-10-20 LAB — KAPPA/LAMBDA LIGHT CHAINS
Kappa free light chain: 1.05 mg/dL (ref 0.33–1.94)
Lambda Free Lght Chn: 9.98 mg/dL — ABNORMAL HIGH (ref 0.57–2.63)

## 2012-10-28 ENCOUNTER — Other Ambulatory Visit: Payer: Self-pay | Admitting: *Deleted

## 2012-11-01 ENCOUNTER — Other Ambulatory Visit: Payer: Self-pay | Admitting: Nurse Practitioner

## 2012-11-01 ENCOUNTER — Other Ambulatory Visit: Payer: Self-pay | Admitting: *Deleted

## 2012-11-01 DIAGNOSIS — C9000 Multiple myeloma not having achieved remission: Secondary | ICD-10-CM

## 2012-11-01 MED ORDER — LENALIDOMIDE 5 MG PO CAPS: 5.0000 mg | ORAL_CAPSULE | Freq: Every day | ORAL | Status: DC

## 2012-11-01 NOTE — Telephone Encounter (Signed)
THIS REFILL REQUEST FOR REVLIMID WAS PLACED IN DR.GRANFORTUNA'S ACTIVE WORK BOX. 

## 2012-11-05 ENCOUNTER — Ambulatory Visit (HOSPITAL_BASED_OUTPATIENT_CLINIC_OR_DEPARTMENT_OTHER): Payer: Medicare Other | Admitting: Nurse Practitioner

## 2012-11-05 ENCOUNTER — Ambulatory Visit (HOSPITAL_BASED_OUTPATIENT_CLINIC_OR_DEPARTMENT_OTHER): Payer: Medicare Other | Admitting: Lab

## 2012-11-05 ENCOUNTER — Ambulatory Visit (HOSPITAL_COMMUNITY)
Admission: RE | Admit: 2012-11-05 | Discharge: 2012-11-05 | Disposition: A | Discharge status: Home / Self Care | Payer: Medicare Other | Source: Ambulatory Visit | Attending: Oncology | Admitting: Oncology

## 2012-11-05 ENCOUNTER — Telehealth: Payer: Self-pay | Admitting: Oncology

## 2012-11-05 VITALS — BP 121/79 | HR 89 | Temp 97.3°F | Resp 20 | Ht 71.0 in | Wt 221.2 lb

## 2012-11-05 DIAGNOSIS — C9002 Multiple myeloma in relapse: Secondary | ICD-10-CM

## 2012-11-05 DIAGNOSIS — M79609 Pain in unspecified limb: Secondary | ICD-10-CM | POA: Insufficient documentation

## 2012-11-05 DIAGNOSIS — C9000 Multiple myeloma not having achieved remission: Secondary | ICD-10-CM | POA: Insufficient documentation

## 2012-11-05 DIAGNOSIS — I82409 Acute embolism and thrombosis of unspecified deep veins of unspecified lower extremity: Secondary | ICD-10-CM

## 2012-11-05 DIAGNOSIS — Z86718 Personal history of other venous thrombosis and embolism: Secondary | ICD-10-CM

## 2012-11-05 DIAGNOSIS — M79675 Pain in left toe(s): Secondary | ICD-10-CM

## 2012-11-05 LAB — PROTIME-INR

## 2012-11-05 NOTE — Progress Notes (Signed)
OFFICE PROGRESS NOTE  Interval history:  [name] [name] is a 71 year old man with lambda light chain multiple myeloma. He is currently taking Revlimid 5 mg 21 days on/7 days off. Most recent serum lambda free light chain result returned at 9.98 with kappa:lambda ratio of 0.11 on 10/19/2012 as compared to 4.85 and 0.21 on 07/20/2012.  [name] [name] denies nausea/vomiting. No mouth sores. He has occasional loose stools and is occasionally constipated. Neuropathy symptoms affecting the feet and legs are stable. He denies any neuropathy symptoms in the hands. Chronic back and chest pain unchanged. He has a good appetite.  His wife recently noted a "lump" at the tip of the left great toe. There is mild associated pain.   Objective: Blood pressure 121/79, pulse 89, temperature 97.3 F (36.3 C), temperature source Oral, resp. rate 20, height 5\' 11"  (1.803 m), weight 221 lb 3.2 oz (100.336 kg).  Oropharynx is without thrush or ulceration. No palpable cervical, supraclavicular or axillary lymph nodes. Lungs are clear. Regular cardiac rhythm. Abdomen is soft and nontender. No organomegaly. Trace lower leg edema bilaterally. Calves are nontender. Motor strength 5 over 5. Knee DTRs absent symmetrically. Vibratory sense mildly decreased over the fingertips per tuning fork exam. The left great toe nail is thick and has receded. The distal left great toe is firm and mildly tender.  Lab Results: Lab Results  Component Value Date   WBC 2.9* 10/19/2012   HGB 13.8 10/19/2012   HCT 41.0 10/19/2012   MCV 95.2 10/19/2012   PLT 89* 10/19/2012    Chemistry:    Chemistry      Component Value Date/Time   NA 139 10/19/2012 1227   NA 139 07/20/2012 1123   NA 145 06/13/2009 1503   K 4.2 10/19/2012 1227   K 4.1 07/20/2012 1123   K 4.8* 06/13/2009 1503   CL 106 10/19/2012 1227   CL 106 07/20/2012 1123   CL 108 06/13/2009 1503   CO2 29 10/19/2012 1227   CO2 26 07/20/2012 1123   CO2 29 06/13/2009 1503   BUN 14.0  10/19/2012 1227   BUN 12 07/20/2012 1123   BUN 49* 06/13/2009 1503   CREATININE 1.4* 10/19/2012 1227   CREATININE 1.33 07/22/2012 1334   CREATININE 1.47* 07/20/2012 1123   CREATININE 2.9* 06/13/2009 1503      Component Value Date/Time   CALCIUM 9.0 10/19/2012 1227   CALCIUM 8.8 07/20/2012 1123   CALCIUM 10.5* 06/13/2009 1503   ALKPHOS 151* 10/19/2012 1227   ALKPHOS 137* 07/20/2012 1123   ALKPHOS 135* 06/13/2009 1503   AST 13 10/19/2012 1227   AST 13 07/20/2012 1123   AST 21 06/13/2009 1503   ALT 7 10/19/2012 1227   ALT <8 07/20/2012 1123   BILITOT 0.56 10/19/2012 1227   BILITOT 0.8 07/20/2012 1123   BILITOT 0.60 06/13/2009 1503       Studies/Results: Dg Bone Survey Met  10/19/2012  *RADIOLOGY REPORT*  Clinical Data: Relapse of multiple myeloma  METASTATIC BONE SURVEY  Comparison: Metastatic bone survey of 12/26/2011  Findings: A lucency near the vertex of the calvarium posteriorly appears more prominent may represent myelomatous lesions.  The cervical vertebrae remain in normal alignment.  There is degenerative change at C5-6.  No compression deformity is seen.  Two views of the thoracic spine show osteopenia and degenerative change.  No new compression deformity is seen.  Partial compression of L2 vertebral body appears stable.  No new compression deformity of the lumbar spine is seen.  The  bones are diffusely osteopenic.  Scattered radiolucencies are noted throughout the bones of the pelvis and proximal femurs consistent with myelomatous lesions. These lesions may have increased somewhat when compared to the prior metastatic bone survey.  A lytic lesion in the proximal right femur has not changed significantly.  No definite new lesion is seen.  No definite left femoral lesion is evident.  Multiple radiolucent lesions within the right shoulder and proximal right humerus are present some of which may be slightly more prominent.  No new right humeral lesion is seen, with radiolucent lesions again noted  proximally.  Radiolucent lesions within the left scapula and proximal left humerus again are noted and relatively stable.  No new left humeral lesion is seen with radiolucencies proximally.  A single view of the chest shows lungs to be clear.  The heart is within upper limits normal.  No acute bony abnormality is seen.  No new mid or distal left femoral lesion is seen.  No new mid or distal right femoral lesion is seen.  IMPRESSION: Little change in multiple radiolucent lesions primarily throughout the shoulders, proximal humerus, bones of the pelvis, high in the posterior calvarium near the vertex, and in the proximal right femur.  Some of these lesions may be slightly more prominent but no definite new lesion is evident.   Original Report Authenticated By: Juline Patch, M.D.     Medications: I have reviewed the patient's current medications.  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 He was found to have multiple lytic bone lesions. Serum immunoglobulins were all suppressed. He had monoclonal lambda light chains on immunofixation electrophoresis. There was marked elevation of serum free lambda light chains at 3.5 g. Initial urine showed 6.7 g of protein, primarily free lambda light chains. Bone marrow biopsy showed 87% plasma cells. He was induced into a remission with a combination of Velcade plus dexamethasone with a subsequent addition of Revlimid. Velcade was discontinued due to progressive neuropathy. He went on to receive IV melphalan with autologous bone marrow support at [facility] on 03/12/2010. He remained off of treatment until a small amount of monoclonal lambda free light chain was again found in the urine in March 2012. He was started on Revlimid 10 mg daily beginning 04/09/2011. The Revlimid schedule was changed to 21 days on followed by a 7 day break at the same dose of 10 mg in August 2012 due to myelosuppression. Due to  myelosuppression the Revlimid dose was decreased to 5 mg daily 3 weeks on/1 week off following an office visit on 03/26/2012. He has continued on this dose and schedule. Most recent serum lambda free light chains returned at 9.98 on 10/19/2012 as compared to 4.85 on 07/20/2012.  2. Type 2 diabetes: He continues metformin. 3. Right lower extremity deep vein thrombosis 05/14/2011. He continues full-dose Coumadin anticoagulation. 4. 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 He continues Lyrica. 5. Essential hypertension. Controlled on current medication. 6. Parkinson's disease.  7. Recent bronchitis. 8. Hyperlipidemia. 9. Degenerative arthritis. 10. Chronic renal insufficiency.  11. 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 12. Fungal infection left great toenail. 13. Pain at the distal left great toe. We are referring him for an x-ray.  Disposition-[name] [name] appears stable. He continues Revlimid 5 mg 3 weeks on/1 week off. Bone survey on 10/19/2012 showed little change. Most recent serum lambda free light chains were slightly  increased. He will return to the lab today for an SPEP, serum immunoglobulins and to obtain a 24-hour urine collection container. He will return for a followup visit in one month to review the outstanding results. He will contact the office in the interim with any problems.  Patient seen with Dr. Cyndie Chime.  Lonna Cobb ANP/GNP-BC

## 2012-11-05 NOTE — Telephone Encounter (Signed)
gv and printed appt schedule for pt for Dec  °

## 2012-11-09 ENCOUNTER — Telehealth: Payer: Self-pay | Admitting: *Deleted

## 2012-11-09 NOTE — Telephone Encounter (Signed)
Pt notified of normal foot X-ray per Dr Patsy Lager instructions.

## 2012-11-09 NOTE — Telephone Encounter (Signed)
Message copied by Sabino Snipes on Tue Nov 09, 2012 11:34 AM ------      Message from: Levert Feinstein      Created: Tue Nov 09, 2012 11:00 AM       Call pt foot X-ray normal

## 2012-11-11 LAB — UIFE/LIGHT CHAINS/TP QN, 24-HR UR
Albumin, U: DETECTED
Alpha 2, Urine: DETECTED — AB
Beta, Urine: DETECTED — AB
Gamma Globulin, Urine: DETECTED — AB
Total Protein, Urine-Ur/day: 1039 mg/d — ABNORMAL HIGH (ref 10–140)

## 2012-11-11 LAB — IMMUNOFIXATION ELECTROPHORESIS
IgA: 104 mg/dL (ref 68–379)
IgM, Serum: 75 mg/dL (ref 41–251)
Total Protein, Serum Electrophoresis: 6.5 g/dL (ref 6.0–8.3)

## 2012-11-22 ENCOUNTER — Telehealth: Payer: Self-pay | Admitting: *Deleted

## 2012-11-22 MED ORDER — OXYCODONE HCL 5 MG PO TABS: ORAL_TABLET | ORAL | Status: DC

## 2012-11-22 NOTE — Telephone Encounter (Signed)
Received call from pt's wife stating that pt has been "hard to get out of the bed by himself, weak in back.  Request MD to call her to "make sure no infection is going on to make him weak"  Called and spoke with pt's wife.  She states that pt is "slightly better" since he has been up walking around.  Denies any fever, eating and drinking well, and states that Tylenol is not helping his pain.  "Does he need to come in earlier than next week to get checked out and can he have something stronger for pain?"  Note to Dr. Cyndie Chime.  [id number] called and spoke with pt wife instructing her to continue to monitor pt for any fever, use heating pad to help back pain and that MD ordered pain med.  Instructed that script can be picked-up today and to follow-up as scheduled next week.  Pt's wife verbalized understanding of instructions.

## 2012-11-24 ENCOUNTER — Other Ambulatory Visit: Payer: Self-pay | Admitting: *Deleted

## 2012-11-24 NOTE — Telephone Encounter (Signed)
Spoke with wife, patient has current supply to cover current cycle until 11/30/12, at which point patient is having re-staging studies and possible change in therapy. Patient wife added that patient has been having increased pain in back, took some Oxycodone and experienced nausea without vomiting. Encouraged wife to give him pain med with foods to see if this helps. Will call us back if pain get worse or changes.

## 2012-11-30 ENCOUNTER — Ambulatory Visit (HOSPITAL_BASED_OUTPATIENT_CLINIC_OR_DEPARTMENT_OTHER): Payer: Medicare Other | Admitting: Pharmacist

## 2012-11-30 ENCOUNTER — Other Ambulatory Visit (HOSPITAL_BASED_OUTPATIENT_CLINIC_OR_DEPARTMENT_OTHER): Payer: Medicare Other

## 2012-11-30 DIAGNOSIS — I82409 Acute embolism and thrombosis of unspecified deep veins of unspecified lower extremity: Secondary | ICD-10-CM

## 2012-11-30 DIAGNOSIS — I824Z9 Acute embolism and thrombosis of unspecified deep veins of unspecified distal lower extremity: Secondary | ICD-10-CM

## 2012-11-30 LAB — PROTIME-INR
INR: 2.2 (ref 2.00–3.50)
Protime: 26.4 Seconds — ABNORMAL HIGH (ref 10.6–13.4)

## 2012-11-30 MED ORDER — WARFARIN SODIUM 5 MG PO TABS: ORAL_TABLET | ORAL | Status: DC

## 2012-11-30 NOTE — Progress Notes (Signed)
INR therapeutic today (2.2) on 2.5mg  daily except 5mg  WF No changes.  No complaints. Will be seeing Dr. Cyndie Chime next week to discuss test results.  If Dr. Cyndie Chime decides to change therapy from Revlimid due to relapse of disease, pt will call and inform us.  May need to see patient sooner if this is the case. Otherwise, continue current dose, and recheck INR in 8 weeks.

## 2012-12-06 ENCOUNTER — Ambulatory Visit (HOSPITAL_BASED_OUTPATIENT_CLINIC_OR_DEPARTMENT_OTHER): Payer: Medicare Other | Admitting: Lab

## 2012-12-06 ENCOUNTER — Ambulatory Visit (HOSPITAL_BASED_OUTPATIENT_CLINIC_OR_DEPARTMENT_OTHER): Payer: Medicare Other | Admitting: Oncology

## 2012-12-06 ENCOUNTER — Other Ambulatory Visit (HOSPITAL_BASED_OUTPATIENT_CLINIC_OR_DEPARTMENT_OTHER): Payer: Medicare Other | Admitting: Lab

## 2012-12-06 ENCOUNTER — Telehealth: Payer: Self-pay | Admitting: Oncology

## 2012-12-06 VITALS — BP 127/68 | HR 76 | Temp 97.6°F | Resp 20 | Ht 71.0 in | Wt 222.7 lb

## 2012-12-06 DIAGNOSIS — I82409 Acute embolism and thrombosis of unspecified deep veins of unspecified lower extremity: Secondary | ICD-10-CM

## 2012-12-06 DIAGNOSIS — C9000 Multiple myeloma not having achieved remission: Secondary | ICD-10-CM

## 2012-12-06 DIAGNOSIS — C9002 Multiple myeloma in relapse: Secondary | ICD-10-CM

## 2012-12-06 DIAGNOSIS — Z86718 Personal history of other venous thrombosis and embolism: Secondary | ICD-10-CM

## 2012-12-06 DIAGNOSIS — I824Z9 Acute embolism and thrombosis of unspecified deep veins of unspecified distal lower extremity: Secondary | ICD-10-CM

## 2012-12-06 LAB — CBC WITH DIFFERENTIAL/PLATELET
Basophils Absolute: 0.1 10*3/uL (ref 0.0–0.1)
EOS%: 2.3 % (ref 0.0–7.0)
Eosinophils Absolute: 0.1 10*3/uL (ref 0.0–0.5)
HGB: 14.2 g/dL (ref 13.0–17.1)
NEUT#: 1.8 10*3/uL (ref 1.5–6.5)
RDW: 15.4 % — ABNORMAL HIGH (ref 11.0–14.6)
WBC: 2.9 10*3/uL — ABNORMAL LOW (ref 4.0–10.3)
lymph#: 0.6 10*3/uL — ABNORMAL LOW (ref 0.9–3.3)

## 2012-12-06 LAB — PROTIME-INR
INR: 2.3 (ref 2.00–3.50)
Protime: 27.6 Seconds — ABNORMAL HIGH (ref 10.6–13.4)

## 2012-12-06 NOTE — Telephone Encounter (Signed)
appt made and ptrinted for pt ,pt sent back to the lab

## 2012-12-06 NOTE — Progress Notes (Signed)
Hematology and Oncology Follow Up Visit  [name] [name] [id number] [birth year] 71 y.o. 12/06/2012 5:23 PM   Principle Diagnosis: Encounter Diagnoses  Name Primary?  . Multiple myeloma in relapse Yes  . DVT of lower extremity (deep venous thrombosis)      Interim History:   Followup visit for this 19 -year-old man with lambda light chain multiple myeloma initially diagnosed in June of 2010 when he presented with hip and rib pain and was found to have multiple lytic bone lesions. Serum immunoglobulins all suppressed. Monoclonal lambda on immunofixation electrophoresis. Marked elevation of serum free lambda light chain at 3.5 g. Initial urine with 6.7 g of protein primarily free lambda light chains. Bone marrow biopsy with 87% plasma cells. He was induced into a remission with a combination of Velcade plus dexamethasone with subsequent addition of Revlimid. Velcade had to be stopped due to progressive neuropathy. He went on to receive IV melphalan with autologous bone marrow support at [facility] on 03/12/10. Course complicated by a herpes zoster infection of a cervical dermatome. He remained off treatment until small amount of monoclonal lambda free light chain again found in the urine in March 2012. He was started on Revlimid 10 mg daily beginning 04/09/11. Due to progressive myelosuppression, this was changed to a three-week on one-week off schedule beginning in August 2012. More recently dose was further decreased to 5 mg 3 weeks on one week off on March 18th. Lab parameters have been overall stable until recently. We are now seeing a rise in both serum and urine monoclonal protein levels. Serum free lambda light chains have increased from 3.3 mg percent in June to 10 mg percent as of October 29. to most recent value of 29.9 mg percent as of November 18. Total urine protein has risen from 206 mg to 1039 mg. Free-lambda light chains in the urine up from 2.71 mg percent to 29.9 mg percent between August and  November. Hematologic parameters have otherwise remained stable and hemoglobin today is 14.2 g with total white count 2900, 62% neutrophils, and platelets 105,000. Renal function remains at his baseline with a creatinine of 1.4.  Over the last few weeks he has developed recurrent multisite pain in his shoulders, bilateral lateral ribs, and low back. He is having more difficulty getting out of bed or getting out of a chair. He has significant morning stiffness as well as the pain.   Medications: reviewed  Allergies: No Known Allergies  Review of shift Constitutional:  Chronic fatigue  Respiratory: No cough or dyspnea he has been getting some rhinitis with nasal dripping, scratchy throat, and stuffy ears and consistent with allergies. Cardiovascular:  Some intermittent chest pain which I believe is not ischemic and likely due to bone marrow expansion for myeloma Gastrointestinal: No change in bowel habit. Genito-Urinary: No change in urinary pattern. He feels that he does not completely empty his bladder Musculoskeletal: See above Neurologic: No headache or change in vision. He remains on medication for presumed early Parkinson's disease. Persistent distal paresthesias feet greater than hands partially controlled with Lyrica Skin: No rash or ecchymosis Remaining ROS negative.  Physical Exam: Blood pressure 127/68, pulse 76, temperature 97.6 F (36.4 C), temperature source Oral, resp. rate 20, height 5\' 11"  (1.803 m), weight 222 lb 11.2 oz (101.016 kg). Wt Readings from Last 3 Encounters:  12/06/12 222 lb 11.2 oz (101.016 kg)  11/05/12 221 lb 3.2 oz (100.336 kg)  07/20/12 219 lb 9.6 oz (99.61 kg)     General appearance:  Well-nourished African American man, very soft spoken HENNT: Pharynx no erythema, exudate, or ulcer Lymph nodes: No adenopathy Breasts: Lungs: crit auscultation resonant to percussion Heart: Regular rhythm no murmur Abdomen: Soft, nontender, no mass, no  organomegaly Extremities: Trace to 1+ pedal edema Vascular: No cyanosis Neurologic: Motor strength is 5 over 5, reflexes absent but symmetric, sensation mildly decreased over the fingertips by tuning fork exam Skin: No rash or ecchymosis  Lab Results: Lab Results  Component Value Date   WBC 2.9* 12/06/2012   HGB 14.2 12/06/2012   HCT 42.6 12/06/2012   MCV 95.2 12/06/2012   PLT 105* 12/06/2012     Chemistry      Component Value Date/Time   NA 139 10/19/2012 1227   NA 139 07/20/2012 1123   NA 145 06/13/2009 1503   K 4.2 10/19/2012 1227   K 4.1 07/20/2012 1123   K 4.8* 06/13/2009 1503   CL 106 10/19/2012 1227   CL 106 07/20/2012 1123   CL 108 06/13/2009 1503   CO2 29 10/19/2012 1227   CO2 26 07/20/2012 1123   CO2 29 06/13/2009 1503   BUN 14.0 10/19/2012 1227   BUN 12 07/20/2012 1123   BUN 49* 06/13/2009 1503   CREATININE 1.4* 10/19/2012 1227   CREATININE 1.33 07/22/2012 1334   CREATININE 1.47* 07/20/2012 1123   CREATININE 2.9* 06/13/2009 1503      Component Value Date/Time   CALCIUM 9.0 10/19/2012 1227   CALCIUM 8.8 07/20/2012 1123   CALCIUM 10.5* 06/13/2009 1503   ALKPHOS 151* 10/19/2012 1227   ALKPHOS 137* 07/20/2012 1123   ALKPHOS 135* 06/13/2009 1503   AST 13 10/19/2012 1227   AST 13 07/20/2012 1123   AST 21 06/13/2009 1503   ALT 7 10/19/2012 1227   ALT <8 07/20/2012 1123   BILITOT 0.56 10/19/2012 1227   BILITOT 0.8 07/20/2012 1123   BILITOT 0.60 06/13/2009 1503       Radiological Studies: Bone survey done back in October overall stable. No new lesions. Some of the old lesions slightly more prominent   Impression and Plan: #1. Lambda light chain myeloma Disease is slowly progressing by both clinical and lab parameters. Plan: I am going to complete his restaging with a bone marrow aspiration and biopsy. In view of persistent neuropathy and underlying diabetes, once bone marrow biopsy is done, I will start him on a trial of pomalidomide   CC:. Dr. Renford Dills Dr. Shelbie Hutching, MD 12/16/20135:23 PM

## 2012-12-08 MED ORDER — OXYCODONE HCL 20 MG PO TB12: 20.0000 mg | ORAL_TABLET | Freq: Two times a day (BID) | ORAL | Status: DC

## 2012-12-08 NOTE — Addendum Note (Signed)
Addended by: Barbara Cower on: 12/08/2012 01:56 PM   Modules accepted: Orders

## 2012-12-09 ENCOUNTER — Telehealth: Payer: Self-pay | Admitting: *Deleted

## 2012-12-09 NOTE — Telephone Encounter (Signed)
Scheduled BMBX for 8 am at Short Stay with [facility] & with Margaret[facility] lab.  Notified pt's wife to have pt at [facility] hosp @ 7am.

## 2012-12-10 LAB — IMMUNOFIXATION ELECTROPHORESIS
IgA: 108 mg/dL (ref 68–379)
IgG (Immunoglobin G), Serum: 807 mg/dL (ref 650–1600)

## 2012-12-10 LAB — KAPPA/LAMBDA LIGHT CHAINS: Kappa free light chain: 1.81 mg/dL (ref 0.33–1.94)

## 2012-12-24 ENCOUNTER — Encounter (HOSPITAL_COMMUNITY): Payer: Self-pay | Admitting: Pharmacy Technician

## 2012-12-30 ENCOUNTER — Ambulatory Visit (HOSPITAL_COMMUNITY)
Admission: RE | Admit: 2012-12-30 | Discharge: 2012-12-30 | Disposition: A | Discharge status: Home / Self Care | Payer: Medicare Other | Source: Ambulatory Visit | Attending: Oncology | Admitting: Oncology

## 2012-12-30 ENCOUNTER — Encounter (HOSPITAL_COMMUNITY): Payer: Self-pay

## 2012-12-30 DIAGNOSIS — C9000 Multiple myeloma not having achieved remission: Secondary | ICD-10-CM | POA: Insufficient documentation

## 2012-12-30 DIAGNOSIS — C9002 Multiple myeloma in relapse: Secondary | ICD-10-CM

## 2012-12-30 LAB — CBC WITH DIFFERENTIAL/PLATELET
Basophils Absolute: 0 10*3/uL (ref 0.0–0.1)
Basophils Relative: 0 % (ref 0–1)
HCT: 42.9 % (ref 39.0–52.0)
Hemoglobin: 14.9 g/dL (ref 13.0–17.0)
Lymphocytes Relative: 18 % (ref 12–46)
Monocytes Absolute: 0.3 10*3/uL (ref 0.1–1.0)
Monocytes Relative: 5 % (ref 3–12)
Neutro Abs: 3.8 10*3/uL (ref 1.7–7.7)
Neutrophils Relative %: 75 % (ref 43–77)
RDW: 14.8 % (ref 11.5–15.5)
WBC: 5.1 10*3/uL (ref 4.0–10.5)

## 2012-12-30 MED ORDER — MIDAZOLAM HCL 5 MG/5ML IJ SOLN
INTRAMUSCULAR | Status: AC | PRN
  Administered 2012-12-30: 1 mg via INTRAVENOUS
  Administered 2012-12-30: 0.5 mg via INTRAVENOUS
  Administered 2012-12-30: 2 mg via INTRAVENOUS

## 2012-12-30 MED ORDER — MIDAZOLAM HCL 10 MG/2ML IJ SOLN
INTRAMUSCULAR | Status: AC
  Filled 2012-12-30: qty 2

## 2012-12-30 MED ORDER — SODIUM CHLORIDE 0.9 % IV SOLN
Freq: Once | INTRAVENOUS | Status: AC
  Administered 2012-12-30: 08:00:00 via INTRAVENOUS

## 2012-12-30 NOTE — ED Notes (Signed)
BX site w dime size shadow. No bleeding through

## 2012-12-30 NOTE — ED Notes (Signed)
MD at bedside. 

## 2012-12-30 NOTE — ED Notes (Signed)
Patient denies pain and is resting comfortably.  

## 2012-12-30 NOTE — Procedures (Addendum)
Bone Marrow Biopsy and Aspiration Procedure Note   Informed consent was obtained and potential risks including bleeding, infection and pain were reviewed with the patient.  Red Rule procedure followed  Posterior iliac crest(s) prepped with Betadine.  Lidocaine 2% local anesthesia infiltrated into the subcutaneous tissue.  Premedication: Versed IV 2mg   + 1 = 3.5 mg total  Left bone marrow biopsy and  aspirate was obtained.   The procedure was tolerated well and there were no complications.  Specimens sent for: routine histopathologic stains and sectioning, flow cytometry and cytogenetics  Indication: Re-evaluate relapsed multiple myeloma  Physician: Levert Feinstein

## 2013-01-07 ENCOUNTER — Ambulatory Visit (HOSPITAL_BASED_OUTPATIENT_CLINIC_OR_DEPARTMENT_OTHER): Payer: Medicare Other | Admitting: Oncology

## 2013-01-07 ENCOUNTER — Ambulatory Visit (HOSPITAL_BASED_OUTPATIENT_CLINIC_OR_DEPARTMENT_OTHER): Payer: Medicare Other | Admitting: Lab

## 2013-01-07 ENCOUNTER — Telehealth: Payer: Self-pay | Admitting: Oncology

## 2013-01-07 VITALS — BP 135/85 | HR 79 | Temp 97.5°F | Resp 20 | Ht 71.0 in | Wt 222.3 lb

## 2013-01-07 DIAGNOSIS — C9002 Multiple myeloma in relapse: Secondary | ICD-10-CM

## 2013-01-07 DIAGNOSIS — I82409 Acute embolism and thrombosis of unspecified deep veins of unspecified lower extremity: Secondary | ICD-10-CM

## 2013-01-07 DIAGNOSIS — M79609 Pain in unspecified limb: Secondary | ICD-10-CM

## 2013-01-07 LAB — CBC WITH DIFFERENTIAL/PLATELET
Basophils Absolute: 0 10*3/uL (ref 0.0–0.1)
Eosinophils Absolute: 0 10*3/uL (ref 0.0–0.5)
HGB: 14.9 g/dL (ref 13.0–17.1)
LYMPH%: 16 % (ref 14.0–49.0)
MONO#: 0.2 10*3/uL (ref 0.1–0.9)
NEUT#: 3.3 10*3/uL (ref 1.5–6.5)
Platelets: 91 10*3/uL — ABNORMAL LOW (ref 140–400)
RBC: 4.66 10*6/uL (ref 4.20–5.82)
RDW: 15.5 % — ABNORMAL HIGH (ref 11.0–14.6)
WBC: 4.3 10*3/uL (ref 4.0–10.3)

## 2013-01-07 LAB — COMPREHENSIVE METABOLIC PANEL (CC13)
ALT: 7 U/L (ref 0–55)
Alkaline Phosphatase: 179 U/L — ABNORMAL HIGH (ref 40–150)
Potassium: 4 mEq/L (ref 3.5–5.1)
Sodium: 139 mEq/L (ref 136–145)
Total Bilirubin: 0.67 mg/dL (ref 0.20–1.20)
Total Protein: 6.7 g/dL (ref 6.4–8.3)

## 2013-01-07 NOTE — Progress Notes (Signed)
Hematology and Oncology Follow Up Visit  [name] [name] [id number] July 16, 1941 72 y.o. 01/07/2013 12:08 PM   Principle Diagnosis: Encounter Diagnosis  Name Primary?  . Multiple myeloma in relapse Yes     Interim History:  Extended one-hour visit for this patient and his wife to discuss restaging evaluation of his lambda light chain multiple myeloma. Bone marrow aspiration biopsy done on 12/30/2012 shows 10% plasma cells. This is little changed from a remission marrow from 10/25/2009 when plasma cells were 7%. Lambda serum free light chains  25.8 mg percent on 12/06/2012, 24-hour urine total protein 1039 mg up from baseline of 260 mg. Of note he has some nonselective proteinuria and back in November 2010 protein level was 221 mg but no monoclonal lambda free light chains seen on immunofixation electrophoresis. Most recent bone survey done 10/19/2012 shows multiple lesions but in no new or unstable lesions. He has had some increase in nonspecific bilateral shoulder pain and low back pain over the last few months. No interim fever or infection.  Reviewing his treatment history, he was diagnosed in June 2010. Initial bone marrow with 87% plasma cells. Lambda serum free light chains 3550 mg percent. 24 urine total protein measured by 2 different methods with one method 6.7 g and by another method 19.1 g of protein. Bone x-ray showed multiple lytic lesions. He was treated initially with Revlimid Velcade and dexamethasone. Velcade had to be stopped due to to neuropathy but he did achieve a very nice response. Treatments given between July 2010 and January 2011. He underwent a transplant evaluation at [facility] He received IV melphalan 140 mg per meter squared on 03/12/2010. He achieved a complete response with negative immunofixation studies of urine and serum. Normalization of his lambda free light chain occurred prior to the transplant and was down as low as 1.44 mg percent on 10/25/2009. Monoclonal proteins   became positive again in the urine in April 2012 and he was put back on single agent Revlimid maintenance. Urine  protein levels remained stable as did lambda serum free light chains until June 2013 when there was a minor rise in lambda free light chains in the serum up from 1.8 in April to 3.25 with subsequent slow rise over the next few months to a most recent value  jumping from 9.98 on 10/19/2012 to most recent value of 25.8 mg percent on 11/30/2012.   Medications: reviewed  Allergies: No Known Allergies  Review of Systems: Constitutional:   No constitutional symptoms Respiratory: No cough or dyspnea Cardiovascular:  No chest pain or palpitations Gastrointestinal: No change in bowel habit Genito-Urinary: Not question today Musculoskeletal: See above Neurologic: No headache or change in vision Skin: No rash or ecchymosis Remaining ROS negative.  Physical Exam: Blood pressure 135/85, pulse 79, temperature 97.5 F (36.4 C), temperature source Oral, resp. rate 20, height 5\' 11"  (1.803 m), weight 222 lb 4.8 oz (100.835 kg). Wt Readings from Last 3 Encounters:  01/07/13 222 lb 4.8 oz (100.835 kg)  12/06/12 222 lb 11.2 oz (101.016 kg)  11/05/12 221 lb 3.2 oz (100.336 kg)     General appearance: Well-nourished African American man, very soft spoken HENNT: Pharynx no erythema exudate or ulcer Lymph nodes: No adenopathy Breasts: Lungs: Clear to auscultation resonant to percussion Heart: Regular rhythm no murmur Abdomen: Soft, nontender, Extremities: Trace edema chronic Vascular: No cyanosis Neurologic: Motor strength 5 over 5, reflexes absent symmetric at the knees, 1+ symmetric at the biceps Skin: No rash or ecchymosis  Lab Results:  Lab Results  Component Value Date   WBC 4.3 01/07/2013   HGB 14.9 01/07/2013   HCT 44.2 01/07/2013   MCV 94.8 01/07/2013   PLT 91* 01/07/2013     Chemistry      Component Value Date/Time   NA 139 10/19/2012 1227   NA 139 07/20/2012 1123   NA 145  06/13/2009 1503   K 4.2 10/19/2012 1227   K 4.1 07/20/2012 1123   K 4.8* 06/13/2009 1503   CL 106 10/19/2012 1227   CL 106 07/20/2012 1123   CL 108 06/13/2009 1503   CO2 29 10/19/2012 1227   CO2 26 07/20/2012 1123   CO2 29 06/13/2009 1503   BUN 14.0 10/19/2012 1227   BUN 12 07/20/2012 1123   BUN 49* 06/13/2009 1503   CREATININE 1.4* 10/19/2012 1227   CREATININE 1.33 07/22/2012 1334   CREATININE 1.47* 07/20/2012 1123   CREATININE 2.9* 06/13/2009 1503      Component Value Date/Time   CALCIUM 9.0 10/19/2012 1227   CALCIUM 8.8 07/20/2012 1123   CALCIUM 10.5* 06/13/2009 1503   ALKPHOS 151* 10/19/2012 1227   ALKPHOS 137* 07/20/2012 1123   ALKPHOS 135* 06/13/2009 1503   AST 13 10/19/2012 1227   AST 13 07/20/2012 1123   AST 21 06/13/2009 1503   ALT 7 10/19/2012 1227   ALT <8 07/20/2012 1123   BILITOT 0.56 10/19/2012 1227   BILITOT 0.8 07/20/2012 1123   BILITOT 0.60 06/13/2009 1503       Radiological Studies: No results found.  Impression and Plan: #1. Further progression of lambda light chain myeloma initial diagnosis June 2010 treated as outlined above. He has a low disease burden at present compared with his presenting values so I feel it is a reasonable chance that we can get his disease back under control. He still has some stem cells frozen at [facility]. Whether or not the [facility] physicians would be agreeable to giving him another dose of IV melphalan I will leave to their judgment. If this is not an option, I will put him on a trial of pomalidomide plus prednisone with or without oral melphalan or Cytoxan. I will see him back in 2 weeks for a final disposition on a new treatment plan.  More than 50% of this encounter was direct face-to-face counseling with the patient.    Levert Feinstein, MD 1/17/201412:08 PM

## 2013-01-07 NOTE — Patient Instructions (Signed)
Dr Reece Agar will contact Dr Jacqulyn Bath at [facility] for his ideas. We will see you back in 2 weeks to finalize a treatment plan

## 2013-01-07 NOTE — Telephone Encounter (Signed)
gv and printed appt schedule for pt for Jan adn Feb..Marland KitchenI sent pt back to the lab

## 2013-01-10 LAB — KAPPA/LAMBDA LIGHT CHAINS
Kappa free light chain: 1.02 mg/dL (ref 0.33–1.94)
Lambda Free Lght Chn: 58.5 mg/dL — ABNORMAL HIGH (ref 0.57–2.63)

## 2013-01-10 LAB — BETA 2 MICROGLOBULIN, SERUM: Beta-2 Microglobulin: 2.1 mg/L — ABNORMAL HIGH (ref 1.01–1.73)

## 2013-01-12 ENCOUNTER — Telehealth: Payer: Self-pay | Admitting: *Deleted

## 2013-01-12 NOTE — Telephone Encounter (Signed)
Received call from pt's wife asking if Dr.Granfortuna has been in touch with Dr. Jacqulyn Bath at [facility] regarding another stem cell transplant.  Note to Dr Cyndie Chime.

## 2013-01-17 ENCOUNTER — Other Ambulatory Visit: Payer: Self-pay | Admitting: *Deleted

## 2013-01-17 MED ORDER — PREGABALIN 100 MG PO CAPS: 100.0000 mg | ORAL_CAPSULE | Freq: Every day | ORAL | Status: DC

## 2013-01-21 ENCOUNTER — Telehealth: Payer: Self-pay | Admitting: Oncology

## 2013-01-21 ENCOUNTER — Ambulatory Visit (HOSPITAL_BASED_OUTPATIENT_CLINIC_OR_DEPARTMENT_OTHER): Payer: Medicare Other | Admitting: Nurse Practitioner

## 2013-01-21 ENCOUNTER — Other Ambulatory Visit: Payer: Medicare Other | Admitting: Lab

## 2013-01-21 VITALS — BP 140/80 | HR 76 | Temp 97.0°F | Resp 20 | Ht 71.0 in | Wt 220.9 lb

## 2013-01-21 DIAGNOSIS — C9002 Multiple myeloma in relapse: Secondary | ICD-10-CM

## 2013-01-21 DIAGNOSIS — M549 Dorsalgia, unspecified: Secondary | ICD-10-CM

## 2013-01-21 MED ORDER — DEXAMETHASONE 4 MG PO TABS: ORAL_TABLET | ORAL | Status: DC

## 2013-01-21 NOTE — Telephone Encounter (Signed)
gv and printed appt schedule for Feb and March

## 2013-01-21 NOTE — Progress Notes (Signed)
OFFICE PROGRESS NOTE  Interval history:  [name] [name] is a 72 year old man with recent progression of lambda light chain myeloma. He is seen today to discuss the treatment plan.  [name] [name] overall is doing well. He has stable chronic back pain. He has stable neuropathy symptoms involving the hands more so than the feet. Appetite overall is good. His weight is stable.   Objective: Blood pressure 140/80, pulse 76, temperature 97 F (36.1 C), temperature source Oral, resp. rate 20, height 5\' 11"  (1.803 m), weight 220 lb 14.4 oz (100.2 kg).  Oropharynx is without thrush or ulceration. No palpable cervical or subclavicular lymph nodes. Lungs are clear. Regular cardiac rhythm. Abdomen is soft and nontender. No organomegaly. Trace edema at the lower leg bilaterally. Motor strength 5 over 5. Knee DTRs absent symmetrically.  Lab Results: Lab Results  Component Value Date   WBC 4.3 01/07/2013   HGB 14.9 01/07/2013   HCT 44.2 01/07/2013   MCV 94.8 01/07/2013   PLT 91* 01/07/2013    Chemistry:    Chemistry      Component Value Date/Time   NA 139 01/07/2013 1151   NA 139 07/20/2012 1123   NA 145 06/13/2009 1503   K 4.0 01/07/2013 1151   K 4.1 07/20/2012 1123   K 4.8* 06/13/2009 1503   CL 104 01/07/2013 1151   CL 106 07/20/2012 1123   CL 108 06/13/2009 1503   CO2 26 01/07/2013 1151   CO2 26 07/20/2012 1123   CO2 29 06/13/2009 1503   BUN 17.0 01/07/2013 1151   BUN 12 07/20/2012 1123   BUN 49* 06/13/2009 1503   CREATININE 1.4* 01/07/2013 1151   CREATININE 1.33 07/22/2012 1334   CREATININE 1.47* 07/20/2012 1123   CREATININE 2.9* 06/13/2009 1503      Component Value Date/Time   CALCIUM 9.1 01/07/2013 1151   CALCIUM 8.8 07/20/2012 1123   CALCIUM 10.5* 06/13/2009 1503   ALKPHOS 179* 01/07/2013 1151   ALKPHOS 137* 07/20/2012 1123   ALKPHOS 135* 06/13/2009 1503   AST 12 01/07/2013 1151   AST 13 07/20/2012 1123   AST 21 06/13/2009 1503   ALT 7 01/07/2013 1151   ALT <8 07/20/2012 1123   BILITOT 0.67 01/07/2013 1151    BILITOT 0.8 07/20/2012 1123   BILITOT 0.60 06/13/2009 1503       Studies/Results: No results found.  Medications: I have reviewed the patient's current medications.  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 He was found to have multiple lytic bone lesions. Serum immunoglobulins were all suppressed. He had monoclonal lambda light chains on immunofixation electrophoresis. There was marked elevation of serum free lambda light chains at 3.5 g. Initial urine showed 6.7 g of protein, primarily free lambda light chains. Bone marrow biopsy showed 87% plasma cells. He was induced into a remission with a combination of Velcade plus dexamethasone with a subsequent addition of Revlimid. Velcade was discontinued due to progressive neuropathy. He went on to receive IV melphalan with autologous bone marrow support at [facility] on 03/12/2010. He remained off of treatment until a small amount of monoclonal lambda free light chain was again found in the urine in March 2012. He was started on Revlimid 10 mg daily beginning 04/09/2011. The Revlimid schedule was changed to 21 days on followed by a 7 day break at the same dose of 10 mg in August 2012 due to myelosuppression. Due to myelosuppression the Revlimid dose was decreased to  5 mg daily 3 weeks on/1 week off following an office visit on 03/26/2012. Serum lambda free light chains returned at 9.98 on 10/19/2012 as compared to 4.85 on 07/20/2012. There was further increase of the lambda free light chains on 11/30/2012 to 25.8. 24-hour urine total protein returned at 1039 mg which was up from baseline of 260 mg. Bone survey 10/19/2012 showed multiple lesions but no new or unstable lesions. Bone marrow aspiration/biopsy done 12/30/2012 showed 10% plasma cells. 2. Type 2 diabetes: He continues metformin. 3. Right lower extremity deep vein thrombosis 05/14/2011. He continues full-dose Coumadin  anticoagulation. 4. 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 He continues Lyrica. 5. Essential hypertension. Controlled on current medication. 6. Parkinson's disease.  7. Recent bronchitis. 8. Hyperlipidemia. 9. Degenerative arthritis. 10. Chronic renal insufficiency.  11. 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 12. Fungal infection left great toenail.  Disposition-at the time of [name] [name]'s last visit with Dr. Cyndie Chime on 01/07/2013 the possibility of additional stem cell therapy was discussed versus a trial of Pomalidomide plus prednisone with or without oral melphalan or Cytoxan.  [name] [name] prefers to remain in [place] for his treatment. As such, Dr. Cyndie Chime recommends Pomalidomide at an initial dose of 3 mg daily for 21 days followed by a 7 day break plus dexamethasone 20 mg weekly. We reviewed potential toxicities associated with Pomalidomide including myelosuppression, neuropathy, diarrhea or constipation, blood clots. He was given written information as well. We will check labs on a 2 week schedule initially. We anticipate he will begin the Pomalidomide and weekly dexamethasone around 01/28/2013. He will return for a followup visit with Dr. Cyndie Chime during the one-week break. He will contact the office in the interim with any problems.  Patient seen with Dr. Cyndie Chime.  Lonna Cobb ANP/GNP-BC

## 2013-01-24 ENCOUNTER — Other Ambulatory Visit: Payer: Self-pay | Admitting: *Deleted

## 2013-01-24 DIAGNOSIS — C9000 Multiple myeloma not having achieved remission: Secondary | ICD-10-CM

## 2013-01-24 MED ORDER — POMALIDOMIDE 3 MG PO CAPS: 3.0000 mg | ORAL_CAPSULE | Freq: Every day | ORAL | Status: DC

## 2013-01-24 NOTE — Telephone Encounter (Signed)
Script printed for Dr. Patsy Lager signature then will take to [facility]

## 2013-01-25 ENCOUNTER — Ambulatory Visit (HOSPITAL_BASED_OUTPATIENT_CLINIC_OR_DEPARTMENT_OTHER): Payer: Medicare Other | Admitting: Pharmacist

## 2013-01-25 ENCOUNTER — Other Ambulatory Visit (HOSPITAL_BASED_OUTPATIENT_CLINIC_OR_DEPARTMENT_OTHER): Payer: Medicare Other | Admitting: Lab

## 2013-01-25 DIAGNOSIS — I82409 Acute embolism and thrombosis of unspecified deep veins of unspecified lower extremity: Secondary | ICD-10-CM

## 2013-01-25 DIAGNOSIS — I824Z9 Acute embolism and thrombosis of unspecified deep veins of unspecified distal lower extremity: Secondary | ICD-10-CM

## 2013-01-25 NOTE — Patient Instructions (Signed)
Take 5 mg coumadin today then continue Coumadin 5mg  on Wed and Fri and 2.5 mg other days. Recheck INR in 4 weeks on 02/22/13 at 1100 for lab, 1115 for Coumadin Clinic

## 2013-01-25 NOTE — Progress Notes (Signed)
Pt slightly subtherapeutic today at 1.7.  Pt stopped Revlimid 3 weeks ago and will start Pomalyst by weeks end.  No other changes to report. Will have pt take 5 mg coumadin today then continue Coumadin 5mg  on Wed and Fri and 2.5 mg other days. Recheck INR in 4 weeks on 02/22/13 at 1100 for lab, 1115 for Coumadin Clinic.

## 2013-01-26 ENCOUNTER — Telehealth: Payer: Self-pay | Admitting: *Deleted

## 2013-01-26 NOTE — Telephone Encounter (Signed)
Received message from Lea[employer] stating that she was working on [employer] requires script to be filled by [facility] & she is sending script to them.  Return ph # is [phone].

## 2013-01-27 NOTE — Telephone Encounter (Signed)
RECEIVED A FAX FROM [employer] CONCERNING A NOTICE OF RX TRANSFER [POMALYST] TO [facility]. PHARMACY PHONE #[phone]/ FAX #[phone].

## 2013-01-28 ENCOUNTER — Telehealth: Payer: Self-pay | Admitting: *Deleted

## 2013-01-28 NOTE — Telephone Encounter (Signed)
Auth # [id number] received from [employer] & pt notified to do his survey & reminders about drug given.

## 2013-02-01 ENCOUNTER — Telehealth: Payer: Self-pay | Admitting: Pharmacist

## 2013-02-03 ENCOUNTER — Telehealth: Payer: Self-pay | Admitting: *Deleted

## 2013-02-03 NOTE — Telephone Encounter (Signed)
Message received today from yesterday's vm from [facility] stating that they are working on [employer] & need some clinical criteria & req. Call back @ [phone].   Call returned & vm left to call back but pt was on revlimid & had DVT & was progressing & now MD wants pt to start pomalyst.

## 2013-02-07 ENCOUNTER — Other Ambulatory Visit (HOSPITAL_BASED_OUTPATIENT_CLINIC_OR_DEPARTMENT_OTHER): Payer: Medicare Other | Admitting: Lab

## 2013-02-07 ENCOUNTER — Ambulatory Visit (HOSPITAL_BASED_OUTPATIENT_CLINIC_OR_DEPARTMENT_OTHER): Payer: Medicare Other | Admitting: Pharmacist

## 2013-02-07 DIAGNOSIS — I82409 Acute embolism and thrombosis of unspecified deep veins of unspecified lower extremity: Secondary | ICD-10-CM

## 2013-02-07 DIAGNOSIS — I824Z9 Acute embolism and thrombosis of unspecified deep veins of unspecified distal lower extremity: Secondary | ICD-10-CM

## 2013-02-07 DIAGNOSIS — C9002 Multiple myeloma in relapse: Secondary | ICD-10-CM

## 2013-02-07 LAB — CBC WITH DIFFERENTIAL/PLATELET
Eosinophils Absolute: 0.1 10*3/uL (ref 0.0–0.5)
MONO#: 0.2 10*3/uL (ref 0.1–0.9)
MONO%: 4.1 % (ref 0.0–14.0)
NEUT#: 3.9 10*3/uL (ref 1.5–6.5)
RBC: 4.51 10*6/uL (ref 4.20–5.82)
RDW: 14.2 % (ref 11.0–14.6)
WBC: 4.8 10*3/uL (ref 4.0–10.3)
lymph#: 0.7 10*3/uL — ABNORMAL LOW (ref 0.9–3.3)
nRBC: 0 % (ref 0–0)

## 2013-02-07 LAB — PROTIME-INR
INR: 1.8 — ABNORMAL LOW (ref 2.00–3.50)
Protime: 21.6 Seconds — ABNORMAL HIGH (ref 10.6–13.4)

## 2013-02-07 LAB — POCT INR: INR: 1.8

## 2013-02-07 NOTE — Progress Notes (Signed)
INR = 1.8 Pt now on once weekly Decadron; takes 20 mg PO every Monday.  Pts wife called last week to inform us of the med change.  As a result, I instructed pt to take 2.5 mg on Friday last week in anticipation of elevated INR. Pt still awaiting start of Pomolyst.  His medication has not yet arrived. INR nearly at goal; likely low due to dose reduction made to his Coumadin on Friday. I will have pt stay on his usual dose of 2.5 mg/day; 5 mg Wed/Fri as I believe the INR will be minimally affected by steroid. Return in 2 weeks as previously planned. Marily Lente, Pharm.D.

## 2013-02-21 ENCOUNTER — Other Ambulatory Visit: Payer: Self-pay | Admitting: Pharmacist

## 2013-02-21 DIAGNOSIS — I82409 Acute embolism and thrombosis of unspecified deep veins of unspecified lower extremity: Secondary | ICD-10-CM

## 2013-02-22 ENCOUNTER — Ambulatory Visit (HOSPITAL_BASED_OUTPATIENT_CLINIC_OR_DEPARTMENT_OTHER): Payer: Medicare Other | Admitting: Pharmacist

## 2013-02-22 ENCOUNTER — Telehealth: Payer: Self-pay | Admitting: *Deleted

## 2013-02-22 ENCOUNTER — Other Ambulatory Visit (HOSPITAL_BASED_OUTPATIENT_CLINIC_OR_DEPARTMENT_OTHER): Payer: Medicare Other | Admitting: Lab

## 2013-02-22 DIAGNOSIS — C9002 Multiple myeloma in relapse: Secondary | ICD-10-CM

## 2013-02-22 DIAGNOSIS — I824Z9 Acute embolism and thrombosis of unspecified deep veins of unspecified distal lower extremity: Secondary | ICD-10-CM

## 2013-02-22 DIAGNOSIS — I82409 Acute embolism and thrombosis of unspecified deep veins of unspecified lower extremity: Secondary | ICD-10-CM

## 2013-02-22 LAB — LACTATE DEHYDROGENASE (CC13): LDH: 170 U/L (ref 125–245)

## 2013-02-22 LAB — COMPREHENSIVE METABOLIC PANEL (CC13)
BUN: 27.1 mg/dL — ABNORMAL HIGH (ref 7.0–26.0)
CO2: 25 mEq/L (ref 22–29)
Creatinine: 1.6 mg/dL — ABNORMAL HIGH (ref 0.7–1.3)
Glucose: 400 mg/dl — ABNORMAL HIGH (ref 70–99)
Total Bilirubin: 0.75 mg/dL (ref 0.20–1.20)

## 2013-02-22 LAB — CBC WITH DIFFERENTIAL/PLATELET
BASO%: 0 % (ref 0.0–2.0)
EOS%: 0.2 % (ref 0.0–7.0)
HCT: 40.4 % (ref 38.4–49.9)
LYMPH%: 8.9 % — ABNORMAL LOW (ref 14.0–49.0)
MCH: 32 pg (ref 27.2–33.4)
MCHC: 34.9 g/dL (ref 32.0–36.0)
NEUT%: 82 % — ABNORMAL HIGH (ref 39.0–75.0)
lymph#: 0.5 10*3/uL — ABNORMAL LOW (ref 0.9–3.3)

## 2013-02-22 LAB — POCT INR: INR: 1.6

## 2013-02-22 LAB — PROTIME-INR

## 2013-02-22 NOTE — Progress Notes (Signed)
INR = 1.6 on Coumadin 2.5 mg/day; 5 mg Wed/Fri Only complaint today is elevated CBG.  Pt is going to call his PCP about this. No s/sxs of infection. INR has not been therapeutic since December.  We need to increase his dose of Coumadin--> take 5 mg today then change to 2.5 mg/day; 5 mg MWF. Repeat INR in 10 days. Pt will call if med changes made by PCP. Marily Lente, Pharm.D.

## 2013-02-22 NOTE — Telephone Encounter (Signed)
Wife called concerned about pt.'s elevated blood sugar.  He takes steroids (decadron 20mg ) once a week on Mondays.  So far he hasnt seen a big jump in his blood sugars except for last night when it was over 400.  This am is is 247.  He is taking metformin 500mg  once day.  She asks does he just need to contact his PCP about this.  He has an appt. At 11:15 for lab and coumadin clinic. Will discuss with Dr. Cyndie Chime and let them know what to do.

## 2013-02-22 NOTE — Telephone Encounter (Signed)
Patient here for lab and for  Coumadin clinic.  Talked with patient and his wife.  Per Dr. Cyndie Chime; "They should get advice from his PCP - Dr. Nehemiah Settle for his blood sugar issues."  Which they will do. Also asked him if he was having any signs of infection, which he was not.  Let him know that blood sugars can also rise because of infection.  He has not been running any fevers, denies chills and in general is feeling fine.

## 2013-02-23 LAB — KAPPA/LAMBDA LIGHT CHAINS: Kappa:Lambda Ratio: 0.01 — ABNORMAL LOW (ref 0.26–1.65)

## 2013-02-28 ENCOUNTER — Other Ambulatory Visit: Payer: Self-pay | Admitting: *Deleted

## 2013-02-28 DIAGNOSIS — C9000 Multiple myeloma not having achieved remission: Secondary | ICD-10-CM

## 2013-02-28 NOTE — Telephone Encounter (Signed)
THIS REFILL REQUEST FOR POMALYST WAS PLACED IN DR.GRANFORTUNA'S ACTIVE WORK BOX. 

## 2013-03-01 MED ORDER — POMALIDOMIDE 3 MG PO CAPS: 3.0000 mg | ORAL_CAPSULE | Freq: Every day | ORAL | Status: DC

## 2013-03-01 NOTE — Telephone Encounter (Signed)
ADULT MALE/ PT.COMPLETED HIS SURVEY ON 02/28/13. AUTHORIZATION #[id number]

## 2013-03-01 NOTE — Addendum Note (Signed)
Addended by: Arvilla Meres on: 03/01/2013 09:24 AM   Modules accepted: Orders

## 2013-03-04 ENCOUNTER — Ambulatory Visit (HOSPITAL_BASED_OUTPATIENT_CLINIC_OR_DEPARTMENT_OTHER): Payer: Medicare Other | Admitting: Pharmacist

## 2013-03-04 ENCOUNTER — Other Ambulatory Visit (HOSPITAL_BASED_OUTPATIENT_CLINIC_OR_DEPARTMENT_OTHER): Payer: Medicare Other | Admitting: Lab

## 2013-03-04 DIAGNOSIS — C9002 Multiple myeloma in relapse: Secondary | ICD-10-CM

## 2013-03-04 DIAGNOSIS — I824Z9 Acute embolism and thrombosis of unspecified deep veins of unspecified distal lower extremity: Secondary | ICD-10-CM

## 2013-03-04 DIAGNOSIS — C9001 Multiple myeloma in remission: Secondary | ICD-10-CM

## 2013-03-04 DIAGNOSIS — I82409 Acute embolism and thrombosis of unspecified deep veins of unspecified lower extremity: Secondary | ICD-10-CM

## 2013-03-04 LAB — CBC WITH DIFFERENTIAL/PLATELET
Basophils Absolute: 0 10*3/uL (ref 0.0–0.1)
EOS%: 1.9 % (ref 0.0–7.0)
LYMPH%: 23.6 % (ref 14.0–49.0)
MCH: 31.7 pg (ref 27.2–33.4)
MCV: 93.1 fL (ref 79.3–98.0)
MONO%: 15.1 % — ABNORMAL HIGH (ref 0.0–14.0)
Platelets: 93 10*3/uL — ABNORMAL LOW (ref 140–400)
RBC: 4.36 10*6/uL (ref 4.20–5.82)
RDW: 14 % (ref 11.0–14.6)

## 2013-03-04 LAB — PROTIME-INR: INR: 2.5 (ref 2.00–3.50)

## 2013-03-04 NOTE — Progress Notes (Signed)
INR = 2.5 on 2.5 mg/day; 5 mg MWF Pt now on Insulin; not sure name of pen he is using.  His blood sugars have improved. INR at goal.  Continue same dose of Coumadin. Repeat INR in 2 weeks when he is here for MD visit on 03/21/13 Ebony Hail, Pharm.D., CPP 03/04/2013@12 :04 PM

## 2013-03-09 LAB — UIFE/LIGHT CHAINS/TP QN, 24-HR UR
Free Kappa Lt Chains,Ur: 12.7 mg/dL — ABNORMAL HIGH (ref 0.14–2.42)
Free Kappa/Lambda Ratio: 0.16 ratio — ABNORMAL LOW (ref 2.04–10.37)
Free Lambda Excretion/Day: 2081.7 mg/d
Free Lt Chn Excr Rate: 342.9 mg/d
Time: 24 hours
Total Protein, Urine: 91.6 mg/dL
Volume, Urine: 2700 mL

## 2013-03-21 ENCOUNTER — Ambulatory Visit: Payer: Medicare Other | Admitting: Pharmacist

## 2013-03-21 ENCOUNTER — Other Ambulatory Visit (HOSPITAL_BASED_OUTPATIENT_CLINIC_OR_DEPARTMENT_OTHER): Payer: Medicare Other | Admitting: Lab

## 2013-03-21 ENCOUNTER — Encounter: Payer: Self-pay | Admitting: Oncology

## 2013-03-21 ENCOUNTER — Ambulatory Visit (HOSPITAL_BASED_OUTPATIENT_CLINIC_OR_DEPARTMENT_OTHER): Payer: Medicare Other | Admitting: Oncology

## 2013-03-21 ENCOUNTER — Other Ambulatory Visit: Payer: Self-pay | Admitting: *Deleted

## 2013-03-21 VITALS — BP 152/77 | HR 107 | Temp 97.4°F | Resp 20 | Ht 71.0 in | Wt 231.2 lb

## 2013-03-21 DIAGNOSIS — I82409 Acute embolism and thrombosis of unspecified deep veins of unspecified lower extremity: Secondary | ICD-10-CM

## 2013-03-21 DIAGNOSIS — R609 Edema, unspecified: Secondary | ICD-10-CM

## 2013-03-21 DIAGNOSIS — C9002 Multiple myeloma in relapse: Secondary | ICD-10-CM

## 2013-03-21 DIAGNOSIS — E139 Other specified diabetes mellitus without complications: Secondary | ICD-10-CM

## 2013-03-21 DIAGNOSIS — J029 Acute pharyngitis, unspecified: Secondary | ICD-10-CM | POA: Insufficient documentation

## 2013-03-21 HISTORY — DX: Acute pharyngitis, unspecified: J02.9

## 2013-03-21 LAB — CBC WITH DIFFERENTIAL/PLATELET
Basophils Absolute: 0.1 10*3/uL (ref 0.0–0.1)
Eosinophils Absolute: 0.1 10*3/uL (ref 0.0–0.5)
HCT: 40 % (ref 38.4–49.9)
LYMPH%: 21.2 % (ref 14.0–49.0)
MCV: 93.5 fL (ref 79.3–98.0)
MONO#: 0.6 10*3/uL (ref 0.1–0.9)
MONO%: 15.3 % — ABNORMAL HIGH (ref 0.0–14.0)
NEUT#: 2 10*3/uL (ref 1.5–6.5)
NEUT%: 56.8 % (ref 39.0–75.0)
Platelets: 89 10*3/uL — ABNORMAL LOW (ref 140–400)
RBC: 4.28 10*6/uL (ref 4.20–5.82)
nRBC: 0 % (ref 0–0)

## 2013-03-21 LAB — TECHNOLOGIST REVIEW

## 2013-03-21 LAB — POCT INR: INR: 2.5

## 2013-03-21 LAB — PROTIME-INR

## 2013-03-21 MED ORDER — DEXAMETHASONE 4 MG PO TABS: ORAL_TABLET | ORAL | Status: DC

## 2013-03-21 MED ORDER — FUROSEMIDE 20 MG PO TABS: 20.0000 mg | ORAL_TABLET | Freq: Every day | ORAL | Status: DC

## 2013-03-21 NOTE — Patient Instructions (Addendum)
Increase Pomalyst to 4 mg with next cycle Decrease dexsamethasone to just 2 tablets every week  Start lasix water pill (furosemide) 20 mg 1 daily Return in 1 week to check potassium level on the water pill Lab and 24 hour urine test on 4/28 Visit with Nurse practitioner on 5/5 @ 10:45 AM

## 2013-03-21 NOTE — Progress Notes (Signed)
INR = 2.5 on 2.5 mg daily except 5 mg on MWF Medications have remained same. No bleeding.  His pltc is low today; on Pomalyst. INR at goal.  No change to Coumadin dose. Return in 1 month to Coumadin clinic. Ebony Hail, Pharm.D., CPP 03/21/2013@11 :20 AM

## 2013-03-23 ENCOUNTER — Telehealth: Payer: Self-pay | Admitting: Pharmacist

## 2013-03-23 NOTE — Telephone Encounter (Signed)
[name] [name] (pt wife) called to say there were a few changes to [name] [name]'s medications with visit with Dr. Cyndie Chime on 3/31: 1) Neo's Dexamethasone dose was decreased to 8mg  weekly (4mg  tablet x 2 tablets). His dose was decreased from 20mg  weekly to 8mg  weekly. 2) Pomalyst increased to 4mg  with next cycle  3) Start furosemide 20mg  daily.  Pt will continue on coumadin 2.5mg  daily except 5mg  on MWF as instructed on 3/31. Repeat INR on 04/19/13. Wife stated understanding of above instructions.

## 2013-03-23 NOTE — Progress Notes (Signed)
Hematology and Oncology Follow Up Visit  Miquan Tandon [id number] 08/29/1941 72 y.o. 03/23/2013 9:59 AM   Principle Diagnosis: Encounter Diagnoses  Name Primary?  . Pharyngitis, acute   . Multiple myeloma in relapse Yes  . GERD (gastroesophageal reflux disease)   . CRI (chronic renal insufficiency)      Interim History:    Short interim followup visit for this 72 year old man with lambda light chain multiple myeloma. Initial diagnosis June 2010. He had a heavy myeloma burden at time of diagnosis with multiple lytic bone lesions, 87% plasma cells in the bone marrow, and 6.7 g of protein in the urine. 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 He went on to receive high-dose IV melphalan with autologous stem cell support at [facility] 03/12/2010. We began to see small amounts of monoclonal lambda free light chains again in his urine in March of 2012. He was started on Revlimid 10 mg daily beginning in April 2012. Dose had to be adjusted due to myelosuppression down to 5 mg. At time of a visit here in December 2013, he was complaining of recurrent pain in his shoulders, bilateral ribs, and low back. Lab  showed rise in his serum and urine paraprotein levels. He was restaged with a bone marrow biopsy on 12/31/2012 which showed 10% plasma cells, rise in total urine protein from 260 mg to 1039 mg, and rise in serum free lambda light chains from 4.8 mg percent in July 2013,to 10 mg percent on October 29, to a 25.8 mg percent by December 16, to 58.5 mg percent by 01/07/2013. I started him on pomalidomide on 01/28/2013. Initial dose 3 mg 21 days on 7 days rest. He has tolerated the drug well. No significant exacerbation of pre-existing neuropathy which is worse in his feet than his hands. No GI symptoms. No rash. No significant myelosuppression and his hematologic profile remains stable. He has no new complaints today.  Areas of chronic pain some related to arthritis and some to myeloma are stable. He has developed significant fluid retention in his upper abdomen and significant extremity edema with an associated 10 pound weight gain and rise in his random glucose to 400 weekly dexamethasone 20 mg.   Medications: reviewed  Allergies: No Known Allergies  Review of Systems: Constitutional:   Chronic fatigue Respiratory: No cough or dyspnea Cardiovascular:  No chest pain or palpitations; he has developed significant edema and a 10 pound weight gain likely due to weekly high-dose dexamethasone. He has also noted increasing abdominal girth also due to steroids. Gastrointestinal: No abdominal pain or change in bowel habit Genito-Urinary: No urinary tract symptoms Musculoskeletal: Chronic shoulder and rib pain Neurologic: No headache or change in vision Skin: No rash or ecchymosis Remaining ROS negative.  Physical Exam: Blood pressure 152/77, pulse 107, temperature 97.4 F (36.3 C), temperature source Oral, resp. rate 20, height 5\' 11"  (1.803 m), weight 231 lb 3.2 oz (104.872 kg). Wt Readings from Last 3 Encounters:  03/21/13 231 lb 3.2 oz (104.872 kg)  01/21/13 220 lb 14.4 oz (100.2 kg)  01/07/13 222 lb 4.8 oz (100.835 kg)     General appearance: Well-nourished African American man HENNT: Pharynx no erythema exudate or ulcer Lymph nodes: No adenopathy Breasts: Lungs: Clear to auscultation resonant to percussion Heart: Regular rhythm no murmur Abdomen: Soft, nontender, no mass, no organomegaly Extremities: No edema, no calf tenderness Vascular: No cyanosis Neurologic: Motor strength 5  over 5, reflexes absent symmetric at the knees, 1+ symmetric at the biceps, trace decrease in vibration sensation over the fingertips Skin: No rash or ecchymosis  Lab Results: Lab Results  Component Value Date   WBC 3.6* 03/21/2013   HGB 13.6 03/21/2013   HCT 40.0 03/21/2013   MCV 93.5 03/21/2013   PLT 89* 03/21/2013      Chemistry      Component Value Date/Time   NA 133* 02/22/2013 1104   NA 139 07/20/2012 1123   NA 145 06/13/2009 1503   K 4.3 02/22/2013 1104   K 4.1 07/20/2012 1123   K 4.8* 06/13/2009 1503   CL 98 02/22/2013 1104   CL 106 07/20/2012 1123   CL 108 06/13/2009 1503   CO2 25 02/22/2013 1104   CO2 26 07/20/2012 1123   CO2 29 06/13/2009 1503   BUN 27.1* 02/22/2013 1104   BUN 12 07/20/2012 1123   BUN 49* 06/13/2009 1503   CREATININE 1.6* 02/22/2013 1104   CREATININE 1.33 07/22/2012 1334   CREATININE 1.47* 07/20/2012 1123   CREATININE 2.9* 06/13/2009 1503      Component Value Date/Time   CALCIUM 9.0 02/22/2013 1104   CALCIUM 8.8 07/20/2012 1123   CALCIUM 10.5* 06/13/2009 1503   ALKPHOS 134 02/22/2013 1104   ALKPHOS 137* 07/20/2012 1123   ALKPHOS 135* 06/13/2009 1503   AST 9 02/22/2013 1104   AST 13 07/20/2012 1123   AST 21 06/13/2009 1503   ALT 9 02/22/2013 1104   ALT <8 07/20/2012 1123   BILITOT 0.75 02/22/2013 1104   BILITOT 0.8 07/20/2012 1123   BILITOT 0.60 06/13/2009 1503      Impression and Plan: #1. Relapsed lambda light chain myeloma I believe we are starting to see a response to the pomalidomide. He had an approximate doubling of his lambda serum free light chains prior to starting pomalidomide. After just one cycle at a reduced dose of 3 mg, rate of rise of the paraprotein has slowed. He has tolerated the drug well so I'm going to escalate to 4 mg 21 days on 7 days rest going into the next cycle.  He has gained 10 pounds of fluid on the steroids and his sugar has shot up to 400. I going to cut his Decadron down to just 80 mg weekly for now. Clinical trial did show a potentiation of the pomalidomide effect with the addition of steroids so I don't want to stop them altogether.  #2. Peripheral edema and temp weight gain. Likely effect of steroids. I am cutting the steroid dose down to 8 mg of Decadron weekly. I were to start him on some Lasix 20 mg daily and have him come back in one week to check a  potassium level.  #3. Steroid-induced diabetes. This should improve with decrease in the steroid dose. He'll continue to monitor his sugars.     CC:.    Levert Feinstein, MD 4/2/20149:59 AM

## 2013-03-25 ENCOUNTER — Other Ambulatory Visit: Payer: Self-pay | Admitting: *Deleted

## 2013-03-25 DIAGNOSIS — C9000 Multiple myeloma not having achieved remission: Secondary | ICD-10-CM

## 2013-03-25 MED ORDER — POMALIDOMIDE 4 MG PO CAPS: 4.0000 mg | ORAL_CAPSULE | Freq: Every day | ORAL | Status: DC

## 2013-03-25 NOTE — Telephone Encounter (Signed)
THIS REFILL REQUEST FOR POMALYST WAS PLACED ON DR.GRANFORTUNA'S ACTIVE WORK BOX.

## 2013-03-25 NOTE — Telephone Encounter (Signed)
NOTIFIED PT.'S WIFE TO HAVE HER HUSBAND TAKE HIS SURVEY. ADULT MALE/ AUTHORIZATION #[id number]

## 2013-04-19 ENCOUNTER — Ambulatory Visit (HOSPITAL_BASED_OUTPATIENT_CLINIC_OR_DEPARTMENT_OTHER): Payer: Medicare Other | Admitting: Pharmacist

## 2013-04-19 ENCOUNTER — Other Ambulatory Visit: Payer: Self-pay | Admitting: *Deleted

## 2013-04-19 ENCOUNTER — Telehealth: Payer: Self-pay | Admitting: Nurse Practitioner

## 2013-04-19 ENCOUNTER — Other Ambulatory Visit (HOSPITAL_BASED_OUTPATIENT_CLINIC_OR_DEPARTMENT_OTHER): Payer: Medicare Other | Admitting: Lab

## 2013-04-19 DIAGNOSIS — I824Z9 Acute embolism and thrombosis of unspecified deep veins of unspecified distal lower extremity: Secondary | ICD-10-CM

## 2013-04-19 DIAGNOSIS — I82409 Acute embolism and thrombosis of unspecified deep veins of unspecified lower extremity: Secondary | ICD-10-CM

## 2013-04-19 DIAGNOSIS — C9002 Multiple myeloma in relapse: Secondary | ICD-10-CM

## 2013-04-19 DIAGNOSIS — N189 Chronic kidney disease, unspecified: Secondary | ICD-10-CM

## 2013-04-19 DIAGNOSIS — C9000 Multiple myeloma not having achieved remission: Secondary | ICD-10-CM

## 2013-04-19 LAB — CBC WITH DIFFERENTIAL/PLATELET
EOS%: 0.4 % (ref 0.0–7.0)
Eosinophils Absolute: 0 10*3/uL (ref 0.0–0.5)
LYMPH%: 21.6 % (ref 14.0–49.0)
MCH: 32.3 pg (ref 27.2–33.4)
MCV: 95.9 fL (ref 79.3–98.0)
MONO%: 13 % (ref 0.0–14.0)
NEUT#: 2.5 10*3/uL (ref 1.5–6.5)
Platelets: 99 10*3/uL — ABNORMAL LOW (ref 140–400)
RBC: 4.07 10*6/uL — ABNORMAL LOW (ref 4.20–5.82)

## 2013-04-19 LAB — COMPREHENSIVE METABOLIC PANEL (CC13)
AST: 8 U/L (ref 5–34)
Alkaline Phosphatase: 138 U/L (ref 40–150)
BUN: 19.4 mg/dL (ref 7.0–26.0)
Creatinine: 1.4 mg/dL — ABNORMAL HIGH (ref 0.7–1.3)
Total Bilirubin: 0.64 mg/dL (ref 0.20–1.20)

## 2013-04-19 LAB — PROTIME-INR: Protime: 28.8 Seconds — ABNORMAL HIGH (ref 10.6–13.4)

## 2013-04-19 LAB — POCT INR: INR: 2.4

## 2013-04-19 MED ORDER — POMALIDOMIDE 4 MG PO CAPS: 4.0000 mg | ORAL_CAPSULE | Freq: Every day | ORAL | Status: DC

## 2013-04-19 NOTE — Telephone Encounter (Signed)
THIS REFILL REQUEST FOR POMALYST WAS PLACED IN DR.GRANFORTUNA'S ACTIVE WORK BOX. 

## 2013-04-19 NOTE — Telephone Encounter (Signed)
I notified Ms. Ladona Ridgel of Mr. Bolser's blood sugar reading of 433 on labs done today in the office. She reports a just checked his blood sugar at home and it was in the 400 range. She has given insulin based on a sliding scale as prescribed by Dr. Nehemiah Settle. Instructed her to check his blood sugar again in a few hours to ensure that it has improved. She plans to contact Dr. Nehemiah Settle with persistent elevated readings.

## 2013-04-19 NOTE — Addendum Note (Signed)
Addended by: Arvilla Meres on: 04/19/2013 04:35 PM   Modules accepted: Orders

## 2013-04-19 NOTE — Progress Notes (Signed)
INR at goal.  No changes in medications or bleeding/bruising.  [name] [name] has been very stable on current coumadin dose of 5mg  MWF and 2.5mg  other days.  Will continue current coumadin dose and check PT/INR in 1 month.

## 2013-04-20 LAB — KAPPA/LAMBDA LIGHT CHAINS
Kappa:Lambda Ratio: 0.02 — ABNORMAL LOW (ref 0.26–1.65)
Lambda Free Lght Chn: 50.1 mg/dL — ABNORMAL HIGH (ref 0.57–2.63)

## 2013-04-25 ENCOUNTER — Ambulatory Visit (HOSPITAL_BASED_OUTPATIENT_CLINIC_OR_DEPARTMENT_OTHER): Payer: Medicare Other | Admitting: Nurse Practitioner

## 2013-04-25 ENCOUNTER — Telehealth: Payer: Self-pay | Admitting: Oncology

## 2013-04-25 ENCOUNTER — Other Ambulatory Visit: Payer: Medicare Other | Admitting: Lab

## 2013-04-25 VITALS — BP 114/67 | HR 74 | Temp 97.5°F | Resp 18 | Ht 71.0 in | Wt 233.3 lb

## 2013-04-25 DIAGNOSIS — C9002 Multiple myeloma in relapse: Secondary | ICD-10-CM

## 2013-04-25 DIAGNOSIS — E1149 Type 2 diabetes mellitus with other diabetic neurological complication: Secondary | ICD-10-CM

## 2013-04-25 DIAGNOSIS — E1142 Type 2 diabetes mellitus with diabetic polyneuropathy: Secondary | ICD-10-CM

## 2013-04-25 DIAGNOSIS — I824Z9 Acute embolism and thrombosis of unspecified deep veins of unspecified distal lower extremity: Secondary | ICD-10-CM

## 2013-04-25 DIAGNOSIS — I82409 Acute embolism and thrombosis of unspecified deep veins of unspecified lower extremity: Secondary | ICD-10-CM

## 2013-04-25 LAB — UIFE/LIGHT CHAINS/TP QN, 24-HR UR
Free Kappa Lt Chains,Ur: 18.2 mg/dL — ABNORMAL HIGH (ref 0.14–2.42)
Free Kappa/Lambda Ratio: 0.25 ratio — ABNORMAL LOW (ref 2.04–10.37)
Free Lambda Excretion/Day: 1458 mg/d
Free Lt Chn Excr Rate: 364 mg/d
Time: 24 hours
Total Protein, Urine: 92.9 mg/dL

## 2013-04-25 NOTE — Telephone Encounter (Signed)
gv and printed appt sched and avs to pt for May and June

## 2013-04-25 NOTE — Progress Notes (Signed)
OFFICE PROGRESS NOTE  Interval history:  [name] [name] is a 72 year old man with a lambda light chain myeloma. He began Pomalidomide/dexamethasone 03/09/2013. He began cycle 2 on 04/06/2013. The serum lambda light chains were improved on 04/19/2013 at 50.1 as compared to 67 on 02/22/2013. 24-hour urine resulted on 04/21/2013 showed 1858 mg of protein excretion in a 24-hour period. This compares to 2473 mg of protein excretion on 03/07/2013.  Routine labs on 04/19/2013 returned with a serum glucose of 433. He is checking blood sugars frequently. Insulin is being adjusted per Dr. Nehemiah Settle. Blood sugar this morning was in the 130 range.  He denies nausea/vomiting. No mouth sores. No diarrhea. He has intermittent constipation. He takes laxatives. He notes periodic leg cramps. Stable neuropathy symptoms in the feet/lower legs greater than hands. No change in pain at the low back and ribs. He continues to note leg swelling. He is taking Lasix 20 mg daily.   Objective: Blood pressure 114/67, pulse 74, temperature 97.5 F (36.4 C), temperature source Oral, resp. rate 18, height 5\' 11"  (1.803 m), weight 233 lb 4.8 oz (105.824 kg).  No thrush or ulceration. No palpable cervical, supraclavicular or axillary lymph nodes. Lungs are clear. Regular cardiac rhythm. Abdomen is soft and nontender. No organomegaly. Pitting edema at the lower legs bilaterally. Motor strength 5 over 5. Knee DTRs absent symmetrically. Mild decrease in vibratory senseover the fingertips per tuning fork exam.  Lab Results: Lab Results  Component Value Date   WBC 3.9* 04/19/2013   HGB 13.2 04/19/2013   HCT 39.1 04/19/2013   MCV 95.9 04/19/2013   PLT 99* 04/19/2013    Chemistry:    Chemistry      Component Value Date/Time   NA 135* 04/19/2013 1040   NA 139 07/20/2012 1123   NA 145 06/13/2009 1503   K 4.3 04/19/2013 1040   K 4.1 07/20/2012 1123   K 4.8* 06/13/2009 1503   CL 100 04/19/2013 1040   CL 106 07/20/2012 1123   CL 108 06/13/2009  1503   CO2 24 04/19/2013 1040   CO2 26 07/20/2012 1123   CO2 29 06/13/2009 1503   BUN 19.4 04/19/2013 1040   BUN 12 07/20/2012 1123   BUN 49* 06/13/2009 1503   CREATININE 1.4* 04/19/2013 1040   CREATININE 1.33 07/22/2012 1334   CREATININE 1.47* 07/20/2012 1123   CREATININE 2.9* 06/13/2009 1503      Component Value Date/Time   CALCIUM 8.6 04/19/2013 1040   CALCIUM 8.8 07/20/2012 1123   CALCIUM 10.5* 06/13/2009 1503   ALKPHOS 138 04/19/2013 1040   ALKPHOS 137* 07/20/2012 1123   ALKPHOS 135* 06/13/2009 1503   AST 8 04/19/2013 1040   AST 13 07/20/2012 1123   AST 21 06/13/2009 1503   ALT <6 Repeated and Verified 04/19/2013 1040   ALT <8 07/20/2012 1123   BILITOT 0.64 04/19/2013 1040   BILITOT 0.8 07/20/2012 1123   BILITOT 0.60 06/13/2009 1503       Studies/Results: No results found.  Medications: I have reviewed the patient's current medications.  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 He was found to have multiple lytic bone lesions. Serum immunoglobulins were all suppressed. He had monoclonal lambda light chains on immunofixation electrophoresis. There was marked elevation of serum free lambda light chains at 3.5 g. Initial urine showed 6.7 g of protein, primarily free lambda light chains. Bone marrow biopsy showed 87% plasma cells. He was induced into a  remission with a combination of Velcade plus dexamethasone with a subsequent addition of Revlimid. Velcade was discontinued due to progressive neuropathy. He went on to receive IV melphalan with autologous bone marrow support at [facility] on 03/12/2010. He remained off of treatment until a small amount of monoclonal lambda free light chain was again found in the urine in March 2012. He was started on Revlimid 10 mg daily beginning 04/09/2011. The Revlimid schedule was changed to 21 days on followed by a 7 day break at the same dose of 10 mg in August 2012 due to myelosuppression. Due to  myelosuppression the Revlimid dose was decreased to 5 mg daily 3 weeks on/1 week off following an office visit on 03/26/2012. Serum lambda free light chains returned at 9.98 on 10/19/2012 as compared to 4.85 on 07/20/2012. There was further increase of the lambda free light chains on 11/30/2012 to 25.8. 24-hour urine total protein returned at 1039 mg which was up from baseline of 260 mg. Bone survey 10/19/2012 showed multiple lesions but no new or unstable lesions. Bone marrow aspiration/biopsy done 12/30/2012 showed 10% plasma cells. Pomalidomide cycle 1 initiated on 03/09/2013. He began cycle 2 on 04/06/2013. Serum lambda light chains improved on 04/19/2013. 2. Type 2 diabetes. Exacerbated by steroids. He continues metformin. He is also utilizing insulin. He is followed by Dr. Nehemiah Settle. 3. Right lower extremity deep vein thrombosis 05/14/2011. He continues full-dose Coumadin anticoagulation. 4. 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 He continues Lyrica. 5. Essential hypertension. Controlled on current medication. 6. Parkinson's disease.  7. Recent bronchitis. 8. Hyperlipidemia. 9. Degenerative arthritis. 10. Chronic renal insufficiency.  11. 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 12. Fungal infection left great toenail. 13. Leg edema. Likely effect of steroids. Decadron was decreased to 8 mg weekly following an office visit on 03/21/2013. Lasix was initiated at 20 mg daily.  Disposition-[name] [name] appears stable. He is completing cycle 2 Pomalidomide. Recent lambda free light chains improved. He will begin cycle 3 on 05/04/2013. We will continue to check labs every 2 weeks. He will return for a followup visit on 05/31/2013.  Lonna Cobb ANP/GNP-BC

## 2013-05-02 ENCOUNTER — Other Ambulatory Visit (HOSPITAL_BASED_OUTPATIENT_CLINIC_OR_DEPARTMENT_OTHER): Payer: Medicare Other | Admitting: Lab

## 2013-05-02 DIAGNOSIS — C9002 Multiple myeloma in relapse: Secondary | ICD-10-CM

## 2013-05-02 LAB — CBC WITH DIFFERENTIAL/PLATELET
Basophils Absolute: 0.1 10*3/uL (ref 0.0–0.1)
Eosinophils Absolute: 0 10*3/uL (ref 0.0–0.5)
HGB: 12.9 g/dL — ABNORMAL LOW (ref 13.0–17.1)
MCV: 97.7 fL (ref 79.3–98.0)
MONO#: 0.2 10*3/uL (ref 0.1–0.9)
NEUT#: 1.6 10*3/uL (ref 1.5–6.5)
RDW: 15.5 % — ABNORMAL HIGH (ref 11.0–14.6)
WBC: 3 10*3/uL — ABNORMAL LOW (ref 4.0–10.3)
lymph#: 1.1 10*3/uL (ref 0.9–3.3)

## 2013-05-13 ENCOUNTER — Other Ambulatory Visit: Payer: Self-pay | Admitting: *Deleted

## 2013-05-13 DIAGNOSIS — C9000 Multiple myeloma not having achieved remission: Secondary | ICD-10-CM

## 2013-05-13 MED ORDER — POMALIDOMIDE 4 MG PO CAPS: 4.0000 mg | ORAL_CAPSULE | Freq: Every day | ORAL | Status: DC

## 2013-05-17 ENCOUNTER — Other Ambulatory Visit: Payer: Medicare Other

## 2013-05-18 ENCOUNTER — Other Ambulatory Visit (HOSPITAL_BASED_OUTPATIENT_CLINIC_OR_DEPARTMENT_OTHER): Payer: Medicare Other | Admitting: Lab

## 2013-05-18 ENCOUNTER — Ambulatory Visit (HOSPITAL_BASED_OUTPATIENT_CLINIC_OR_DEPARTMENT_OTHER): Payer: Medicare Other | Admitting: Pharmacist

## 2013-05-18 DIAGNOSIS — I82409 Acute embolism and thrombosis of unspecified deep veins of unspecified lower extremity: Secondary | ICD-10-CM

## 2013-05-18 DIAGNOSIS — C9002 Multiple myeloma in relapse: Secondary | ICD-10-CM

## 2013-05-18 DIAGNOSIS — I824Z9 Acute embolism and thrombosis of unspecified deep veins of unspecified distal lower extremity: Secondary | ICD-10-CM

## 2013-05-18 LAB — CBC WITH DIFFERENTIAL/PLATELET
BASO%: 4.4 % — ABNORMAL HIGH (ref 0.0–2.0)
EOS%: 4.8 % (ref 0.0–7.0)
HCT: 39 % (ref 38.4–49.9)
HGB: 13.2 g/dL (ref 13.0–17.1)
MCH: 33.1 pg (ref 27.2–33.4)
MCHC: 33.8 g/dL (ref 32.0–36.0)
MONO#: 0.4 10*3/uL (ref 0.1–0.9)
NEUT%: 43.2 % (ref 39.0–75.0)
RDW: 15.7 % — ABNORMAL HIGH (ref 11.0–14.6)
WBC: 2.7 10*3/uL — ABNORMAL LOW (ref 4.0–10.3)
lymph#: 0.8 10*3/uL — ABNORMAL LOW (ref 0.9–3.3)

## 2013-05-18 LAB — POCT INR: INR: 1.8

## 2013-05-18 LAB — PROTIME-INR
INR: 1.8 — ABNORMAL LOW (ref 2.00–3.50)
Protime: 21.6 Seconds — ABNORMAL HIGH (ref 10.6–13.4)

## 2013-05-18 LAB — COMPREHENSIVE METABOLIC PANEL (CC13)
Alkaline Phosphatase: 138 U/L (ref 40–150)
BUN: 28.4 mg/dL — ABNORMAL HIGH (ref 7.0–26.0)
CO2: 27 mEq/L (ref 22–29)
Creatinine: 1.5 mg/dL — ABNORMAL HIGH (ref 0.7–1.3)
Glucose: 325 mg/dl — ABNORMAL HIGH (ref 70–99)
Sodium: 139 mEq/L (ref 136–145)
Total Bilirubin: 0.92 mg/dL (ref 0.20–1.20)
Total Protein: 6 g/dL — ABNORMAL LOW (ref 6.4–8.3)

## 2013-05-18 LAB — LACTATE DEHYDROGENASE (CC13): LDH: 158 U/L (ref 125–245)

## 2013-05-18 NOTE — Progress Notes (Signed)
INR = 1.8 on 5mg  on Mon, Wed and Fri and 2.5 mg other days Pltc & ANC low.  On Pomolyst. No med changes.  No missed doses of Coumadin. Occasional blood-tinged (pink) sputum in the AM's.  Minor. Small petechial spots. INR below goal.  Pt has been at therapetic level on current dose.  Given low Pltc, I will leave pts Coumadin dose alone. Repeat INR on 6/10- sees Dr. Cyndie Chime same day. Pt aware of bleeding & infection risk.   Ebony Hail, Pharm.D., CPP 05/18/2013@12 :03 PM

## 2013-05-19 ENCOUNTER — Telehealth: Payer: Self-pay | Admitting: Neurology

## 2013-05-19 DIAGNOSIS — C9001 Multiple myeloma in remission: Secondary | ICD-10-CM

## 2013-05-19 LAB — KAPPA/LAMBDA LIGHT CHAINS: Kappa free light chain: 1.3 mg/dL (ref 0.33–1.94)

## 2013-05-19 MED ORDER — RASAGILINE MESYLATE 1 MG PO TABS: 1.0000 mg | ORAL_TABLET | Freq: Every day | ORAL | Status: DC

## 2013-05-19 NOTE — Telephone Encounter (Signed)
Rx sent.  I returned call.  Got no answer.  Left message.

## 2013-05-19 NOTE — Telephone Encounter (Signed)
Patient's spouse is calling to let us know he needs a refill on his Azilect.  She tells me he's taking 1mg  per day, however it looks like he's taking 0.5, last prescription I see in [facility].  [facility]  Please give Mrs. Hoon a call back at:  [phone] asap.

## 2013-05-31 ENCOUNTER — Ambulatory Visit (HOSPITAL_BASED_OUTPATIENT_CLINIC_OR_DEPARTMENT_OTHER): Payer: Medicare Other | Admitting: Pharmacist

## 2013-05-31 ENCOUNTER — Encounter: Payer: Self-pay | Admitting: Oncology

## 2013-05-31 ENCOUNTER — Telehealth: Payer: Self-pay | Admitting: Oncology

## 2013-05-31 ENCOUNTER — Ambulatory Visit (HOSPITAL_BASED_OUTPATIENT_CLINIC_OR_DEPARTMENT_OTHER): Payer: Medicare Other | Admitting: Oncology

## 2013-05-31 ENCOUNTER — Other Ambulatory Visit (HOSPITAL_BASED_OUTPATIENT_CLINIC_OR_DEPARTMENT_OTHER): Payer: Medicare Other | Admitting: Lab

## 2013-05-31 VITALS — BP 116/70 | HR 88 | Temp 97.2°F | Resp 20 | Ht 71.0 in | Wt 230.5 lb

## 2013-05-31 DIAGNOSIS — G620 Drug-induced polyneuropathy: Secondary | ICD-10-CM

## 2013-05-31 DIAGNOSIS — N189 Chronic kidney disease, unspecified: Secondary | ICD-10-CM

## 2013-05-31 DIAGNOSIS — E119 Type 2 diabetes mellitus without complications: Secondary | ICD-10-CM

## 2013-05-31 DIAGNOSIS — Z7901 Long term (current) use of anticoagulants: Secondary | ICD-10-CM

## 2013-05-31 DIAGNOSIS — I82409 Acute embolism and thrombosis of unspecified deep veins of unspecified lower extremity: Secondary | ICD-10-CM

## 2013-05-31 DIAGNOSIS — C9002 Multiple myeloma in relapse: Secondary | ICD-10-CM

## 2013-05-31 DIAGNOSIS — Z86718 Personal history of other venous thrombosis and embolism: Secondary | ICD-10-CM

## 2013-05-31 DIAGNOSIS — E1140 Type 2 diabetes mellitus with diabetic neuropathy, unspecified: Secondary | ICD-10-CM

## 2013-05-31 DIAGNOSIS — IMO0002 Reserved for concepts with insufficient information to code with codable children: Secondary | ICD-10-CM

## 2013-05-31 HISTORY — DX: Type 2 diabetes mellitus with diabetic neuropathy, unspecified: E11.40

## 2013-05-31 HISTORY — DX: Reserved for concepts with insufficient information to code with codable children: IMO0002

## 2013-05-31 LAB — CBC WITH DIFFERENTIAL/PLATELET
Basophils Absolute: 0 10*3/uL (ref 0.0–0.1)
EOS%: 0 % (ref 0.0–7.0)
HCT: 38.5 % (ref 38.4–49.9)
HGB: 13.2 g/dL (ref 13.0–17.1)
MCH: 32.5 pg (ref 27.2–33.4)
MONO#: 0.8 10*3/uL (ref 0.1–0.9)
NEUT#: 3.4 10*3/uL (ref 1.5–6.5)
NEUT%: 63.9 % (ref 39.0–75.0)
RDW: 14.8 % — ABNORMAL HIGH (ref 11.0–14.6)
WBC: 5.3 10*3/uL (ref 4.0–10.3)
lymph#: 1.1 10*3/uL (ref 0.9–3.3)

## 2013-05-31 NOTE — Progress Notes (Signed)
INR slightly below goal today. Pltc = 143, Hg = 13.2, Hct = 38.5 No problems to report regarding anticoagulation. Pt seen by MD today. Dexamethasone decreased from 8mg  Q wk to 4mg  Q wk starting the week of 06/06/13. Next cycle of Pomalyst begins on 06/01/13. No missed coumadin doses. No changes in diet. No s/s of clotting. INR is slightly below goal for 2 consecutive results. Pltc is WNL today but Pomalyst tends to have his pltc decrease to ~ 100k. Will slightly increase dose to 5mg  daily except 2.5mg  on SuThuSat. Recheck INR in 2 weeks with next lab appointment on 06/15/13; lab at 11am and coumadin clinic at 11:15am.

## 2013-05-31 NOTE — Telephone Encounter (Signed)
gv and printed appt sched and avs for pt  °

## 2013-05-31 NOTE — Progress Notes (Signed)
Hematology and Oncology Follow Up Visit  [name] [name] [id number] 1941/08/30 72 y.o. 05/31/2013 7:48 PM   Principle Diagnosis: Encounter Diagnoses  Name Primary?  . Multiple myeloma in relapse Yes  . Peripheral neuropathy, secondary to drugs or chemicals   . Diabetic neuropathy, type II diabetes mellitus      Interim History:   Followup visit for this 72 year old man with lambda light chain multiple myeloma. Initial diagnosis June 2010. He had a heavy myeloma burden at time of diagnosis with multiple lytic bone lesions, 87% plasma cells in the bone marrow, and 6.7 g of protein in the urine. 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 He went on to receive high-dose IV melphalan with autologous stem cell support at [facility] 03/12/2010. We began to see small amounts of monoclonal lambda free light chains again in his urine in March of 2012. He was started on Revlimid 10 mg daily beginning in April 2012. Dose had to be adjusted due to myelosuppression down to 5 mg. At time of a visit here in December 2013, he was complaining of recurrent pain in his shoulders, bilateral ribs, and low back. Lab showed rise in his serum and urine paraprotein levels. He was restaged with a bone marrow biopsy on 12/31/2012 which showed 10% plasma cells, rise in total urine protein from 260 mg to 1039 mg, and rise in serum free lambda light chains from 4.8 mg percent in July 2013,to 10 mg percent on October 29, to a 25.8 mg percent by December 16, to 58.5 mg percent by 01/07/2013.  I started him on pomalidomide on 01/28/2013. Initial dose 3 mg 21 days on 7 days rest.  He has tolerated the drug well. No significant exacerbation of pre-existing neuropathy which is worse in his feet than his hands. No GI symptoms. No rash. No significant myelosuppression and his hematologic profile remains stable. He has developed progressive fluid  retention from the Decadron initially given at a dose of 20 mg weekly. I cut the dose down to 8 mg weekly at the time of his March 31 visit here.  I was cautiously optimistic that we were starting to see a response. Serum free lambda level rose further to 67 mg percent during the first month on the Pomalyst then started to fall with a value of 50 on April 29. Most recent value trending up again and was 57.7 on May 28. A 24-hour urine total protein was 2473 mg on March 17 with most recent value on May 1 down to 1858 mg. His bone pain has essentially resolved and he has no new problem areas. He does have a number of other complaints today. He has  had increasing upper abdominal distention from the steroids despite lowering the dose. He is having some intermittent tinnitus. He is having problems with oozing of stool after a bowel movement. No loss of control of urinary function. His fingers are stiff. He still has paresthesias of his feet which are chronic and have not changed. He continues on Lyrica. His wife has noted a small bedsore on his left buttock when she was cleaning him. She has been applying topical antibiotic cream. He has  had a good appetite.   Medications: reviewed  Allergies: No Known Allergies  Review of Systems: Constitutional:   Generalized weakness he is spending most of the day in a chair Respiratory: No cough or dyspnea Cardiovascular:  No chest  pain or palpitations Gastrointestinal: See above Genito-Urinary: See above Musculoskeletal: See above Neurologic: No headache or change in vision Skin: See above Remaining ROS negative.  Physical Exam: Blood pressure 116/70, pulse 88, temperature 97.2 F (36.2 C), temperature source Oral, resp. rate 20, height 5\' 11"  (1.803 m), weight 230 lb 8 oz (104.554 kg). Wt Readings from Last 3 Encounters:  05/31/13 230 lb 8 oz (104.554 kg)  04/25/13 233 lb 4.8 oz (105.824 kg)  03/21/13 231 lb 3.2 oz (104.872 kg)     General  appearance: Well-nourished African American man HENNT: He is becoming cushingoid. Pharynx no erythema exudate or ulcer Lymph nodes: No adenopathy Breasts: Lungs: Clear to auscultation resonant to percussion Heart: Regular rhythm no murmur Abdomen: Soft, distended, nontender, no mass, no organomegaly Extremities: Decreased peripheral edema now trace, no calf tenderness Musculoskeletal: GU: Vascular: No carotid bruits, no cyanosis Neurologic: Motor strength 5 over 5, reflexes absent at the knees, 1+ symmetric at the biceps, mild decreased vibration sensation over the fingertips Skin: Small less than 0.5 cm superficial decubitus ulcer left buttock no surrounding erythema. No exudate  Lab Results: Lab Results  Component Value Date   WBC 5.3 05/31/2013   HGB 13.2 05/31/2013   HCT 38.5 05/31/2013   MCV 94.8 05/31/2013   PLT 143 05/31/2013     Chemistry      Component Value Date/Time   NA 139 05/18/2013 1113   NA 139 07/20/2012 1123   NA 145 06/13/2009 1503   K 3.9 05/18/2013 1113   K 4.1 07/20/2012 1123   K 4.8* 06/13/2009 1503   CL 104 05/18/2013 1113   CL 106 07/20/2012 1123   CL 108 06/13/2009 1503   CO2 27 05/18/2013 1113   CO2 26 07/20/2012 1123   CO2 29 06/13/2009 1503   BUN 28.4* 05/18/2013 1113   BUN 12 07/20/2012 1123   BUN 49* 06/13/2009 1503   CREATININE 1.5* 05/18/2013 1113   CREATININE 1.33 07/22/2012 1334   CREATININE 1.47* 07/20/2012 1123   CREATININE 2.9* 06/13/2009 1503      Component Value Date/Time   CALCIUM 8.4 05/18/2013 1113   CALCIUM 8.8 07/20/2012 1123   CALCIUM 10.5* 06/13/2009 1503   ALKPHOS 138 05/18/2013 1113   ALKPHOS 137* 07/20/2012 1123   ALKPHOS 135* 06/13/2009 1503   AST 9 05/18/2013 1113   AST 13 07/20/2012 1123   AST 21 06/13/2009 1503   ALT <6 Repeated and Verified 05/18/2013 1113   ALT <8 07/20/2012 1123   BILITOT 0.92 05/18/2013 1113   BILITOT 0.8 07/20/2012 1123   BILITOT 0.60 06/13/2009 1503        Impression: #1. Relapsed lambda light chain multiple  myeloma Minimal response to pomalidomide but overall stable disease with stable hematologic profile, performance status, and fall in total urine protein. Plan: I'm going to continue the pomalidomide for another 2 months and then reevaluate. I am going to make a further reduction in his Decadron down to just 4 mg weekly. We discussed other possible options today. Other than the IV melphalan given as part of his autologous transplant, he has not received other alkylating agents and I would consider an oral Cytoxan based regimen. We may have  an oral protease inhibitor available soon. Carfilzomab is available but given the fact that this has to be given intravenous for 2 days in a row 3 weeks on one-week off I would like to reserve this until absolutely necessary.   I spent over 45 minutes of direct patient  contact reviewing lab data, in discussion with the patient and his wife, and giving them written notes and summaries of lab results.  #2.Type 2 diabetes. Exacerbated by steroids. He continues metformin. He is also utilizing insulin. I am tapering him off the steroids. He is followed by Dr. Nehemiah Settle.  #3. Right lower extremity deep vein thrombosis 05/14/2011. He continues full-dose Coumadin anticoagulation. 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 He continues Lyrica. #5.Essential hypertension. Controlled on current medication. #6. Parkinson's disease.  #7.. Hyperlipidemia.  #8.Degenerative arthritis.  #9.Chronic renal insufficiency. Creatinine level currently stable.  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      CC:. Dr. Renford Dills; Dr. Verita Schneiders at [facility], [place] 6/10/20147:48 PM

## 2013-05-31 NOTE — Patient Instructions (Addendum)
Take 5mg   (1 tablet daily) except 2.5 mg (1/2 tablet) on SuThuSa.  Recheck INR in 2 weeks on 06/15/13; lab at 11am and coumadin clinic at 11:15am.

## 2013-06-08 ENCOUNTER — Other Ambulatory Visit: Payer: Self-pay | Admitting: *Deleted

## 2013-06-08 DIAGNOSIS — C9000 Multiple myeloma not having achieved remission: Secondary | ICD-10-CM

## 2013-06-08 MED ORDER — POMALIDOMIDE 4 MG PO CAPS: 4.0000 mg | ORAL_CAPSULE | Freq: Every day | ORAL | Status: DC

## 2013-06-15 ENCOUNTER — Ambulatory Visit: Payer: Medicare Other | Admitting: Pharmacist

## 2013-06-15 ENCOUNTER — Other Ambulatory Visit (HOSPITAL_BASED_OUTPATIENT_CLINIC_OR_DEPARTMENT_OTHER): Payer: Medicare Other | Admitting: Lab

## 2013-06-15 DIAGNOSIS — Z5181 Encounter for therapeutic drug level monitoring: Secondary | ICD-10-CM

## 2013-06-15 DIAGNOSIS — I82409 Acute embolism and thrombosis of unspecified deep veins of unspecified lower extremity: Secondary | ICD-10-CM

## 2013-06-15 DIAGNOSIS — C9002 Multiple myeloma in relapse: Secondary | ICD-10-CM

## 2013-06-15 DIAGNOSIS — Z7901 Long term (current) use of anticoagulants: Secondary | ICD-10-CM

## 2013-06-15 LAB — CBC WITH DIFFERENTIAL/PLATELET
BASO%: 3.8 % — ABNORMAL HIGH (ref 0.0–2.0)
Eosinophils Absolute: 0.2 10*3/uL (ref 0.0–0.5)
HCT: 36.7 % — ABNORMAL LOW (ref 38.4–49.9)
LYMPH%: 36.1 % (ref 14.0–49.0)
MCHC: 34.1 g/dL (ref 32.0–36.0)
MONO#: 0.5 10*3/uL (ref 0.1–0.9)
NEUT%: 37.9 % — ABNORMAL LOW (ref 39.0–75.0)
Platelets: 93 10*3/uL — ABNORMAL LOW (ref 140–400)
WBC: 2.9 10*3/uL — ABNORMAL LOW (ref 4.0–10.3)

## 2013-06-15 LAB — PROTIME-INR

## 2013-06-15 LAB — POCT INR: INR: 2.8

## 2013-06-15 LAB — TECHNOLOGIST REVIEW

## 2013-06-15 NOTE — Patient Instructions (Addendum)
Continue 5mg   (1 tablet daily) except 2.5 mg (1/2 tablet) on SuThuSa.  Recheck INR in 2 weeks 0n 06/28/13; lab at 11:45am and coumadin clinic at 12:00pm.

## 2013-06-15 NOTE — Progress Notes (Signed)
Pt seen in clinic today with his wife. INR=2.8 on 5mg  daily with 2.5 mg on SuThSa No changes to report Wife asked about plt count from today.  93 down from 143 last visit Continue 5mg   (1 tablet daily) except 2.5 mg (1/2 tablet) on SuThuSa.  Recheck INR in 2 weeks 0n 06/28/13; lab at 11:45am and coumadin clinic at 12:00pm.

## 2013-06-28 ENCOUNTER — Ambulatory Visit (HOSPITAL_BASED_OUTPATIENT_CLINIC_OR_DEPARTMENT_OTHER): Payer: Medicare Other | Admitting: Pharmacist

## 2013-06-28 ENCOUNTER — Other Ambulatory Visit: Payer: Self-pay | Admitting: Neurology

## 2013-06-28 ENCOUNTER — Other Ambulatory Visit (HOSPITAL_BASED_OUTPATIENT_CLINIC_OR_DEPARTMENT_OTHER): Payer: Medicare Other | Admitting: Lab

## 2013-06-28 DIAGNOSIS — C9002 Multiple myeloma in relapse: Secondary | ICD-10-CM

## 2013-06-28 DIAGNOSIS — Z7901 Long term (current) use of anticoagulants: Secondary | ICD-10-CM

## 2013-06-28 DIAGNOSIS — I82409 Acute embolism and thrombosis of unspecified deep veins of unspecified lower extremity: Secondary | ICD-10-CM

## 2013-06-28 LAB — COMPREHENSIVE METABOLIC PANEL (CC13)
Albumin: 3.6 g/dL (ref 3.5–5.0)
Alkaline Phosphatase: 119 U/L (ref 40–150)
CO2: 26 mEq/L (ref 22–29)
Glucose: 383 mg/dl — ABNORMAL HIGH (ref 70–140)
Potassium: 3.8 mEq/L (ref 3.5–5.1)
Sodium: 138 mEq/L (ref 136–145)
Total Protein: 6 g/dL — ABNORMAL LOW (ref 6.4–8.3)

## 2013-06-28 LAB — CBC WITH DIFFERENTIAL/PLATELET
Eosinophils Absolute: 0 10*3/uL (ref 0.0–0.5)
MONO#: 0.4 10*3/uL (ref 0.1–0.9)
NEUT#: 2 10*3/uL (ref 1.5–6.5)
RBC: 3.93 10*6/uL — ABNORMAL LOW (ref 4.20–5.82)
RDW: 15 % — ABNORMAL HIGH (ref 11.0–14.6)
WBC: 3.3 10*3/uL — ABNORMAL LOW (ref 4.0–10.3)

## 2013-06-28 LAB — PROTIME-INR
INR: 1.7 — ABNORMAL LOW (ref 2.00–3.50)
Protime: 20.4 Seconds — ABNORMAL HIGH (ref 10.6–13.4)

## 2013-06-28 NOTE — Progress Notes (Signed)
Pt seen in clinic today with wife INR=1.7 on 5 mg daily with 2.5 mg on ThSaSun Pt saw PCP yesterday.  Has bilateral LE swelling.  TED hose in place and taking 20 mg lasix. Chest xray to be done tomorrow to rule out fluid on lungs Pt states it does not hurt and is not warm to touch Pt and wife plan to travel to [place] to visit granddaughter on Monday. Plan to slightly increase dose to 5mg  (1 tablet daily) except 2.5 mg (1/2 tablet) on Su and Thu.  Recheck INR in 2 weeks 0n 07/11/13; lab at 11:30am and coumadin clinic at 11:45pm.

## 2013-06-28 NOTE — Patient Instructions (Addendum)
Slightly increase dose to 5mg  (1 tablet daily) except 2.5 mg (1/2 tablet) on Su and Thu.  Recheck INR in 2 weeks 0n 07/11/13; lab at 11:30am and coumadin clinic at 11:45pm.

## 2013-07-11 ENCOUNTER — Other Ambulatory Visit: Payer: Self-pay | Admitting: *Deleted

## 2013-07-11 ENCOUNTER — Ambulatory Visit (HOSPITAL_BASED_OUTPATIENT_CLINIC_OR_DEPARTMENT_OTHER): Payer: Medicare Other | Admitting: Pharmacist

## 2013-07-11 ENCOUNTER — Other Ambulatory Visit (HOSPITAL_BASED_OUTPATIENT_CLINIC_OR_DEPARTMENT_OTHER): Payer: Medicare Other | Admitting: Lab

## 2013-07-11 DIAGNOSIS — Z5181 Encounter for therapeutic drug level monitoring: Secondary | ICD-10-CM

## 2013-07-11 DIAGNOSIS — Z7901 Long term (current) use of anticoagulants: Secondary | ICD-10-CM

## 2013-07-11 DIAGNOSIS — I82409 Acute embolism and thrombosis of unspecified deep veins of unspecified lower extremity: Secondary | ICD-10-CM

## 2013-07-11 DIAGNOSIS — I824Z9 Acute embolism and thrombosis of unspecified deep veins of unspecified distal lower extremity: Secondary | ICD-10-CM

## 2013-07-11 DIAGNOSIS — C9000 Multiple myeloma not having achieved remission: Secondary | ICD-10-CM

## 2013-07-11 MED ORDER — POMALIDOMIDE 4 MG PO CAPS: 4.0000 mg | ORAL_CAPSULE | Freq: Every day | ORAL | Status: DC

## 2013-07-11 NOTE — Progress Notes (Signed)
INR = 3.9 after dose increased to 5 mg/day; 2.5 mg on Sun/Thurs. No bruising.  He has occasional hemoptysis in the mornings.  This is not new.  The frequency is the same. No recent med changes.  He is "on" Pomolyst now. INR is elevated.  Hold x 1 today then decrease weekly dose back to 5 mg/day; 2.5 mg Th/Sat/Sun. Return in 2 weeks. Ebony Hail, Pharm.D., CPP 07/11/2013@11 :59 AM

## 2013-07-11 NOTE — Addendum Note (Signed)
Addended by: Wandalee Ferdinand on: 07/11/2013 03:48 PM   Modules accepted: Orders

## 2013-07-11 NOTE — Telephone Encounter (Signed)
Notified wife to have patient complete his survey

## 2013-07-11 NOTE — Addendum Note (Signed)
Addended by: Wandalee Ferdinand on: 07/11/2013 03:46 PM   Modules accepted: Orders

## 2013-07-11 NOTE — Telephone Encounter (Signed)
THIS REFILL REQUEST FOR POMALYST WAS PLACED IN DR.GRANFORTUNA'S ACTIVE WORK BOX. 

## 2013-07-13 ENCOUNTER — Telehealth: Payer: Self-pay | Admitting: *Deleted

## 2013-07-13 NOTE — Telephone Encounter (Signed)
Wife calls this am to report that patient has uncontrolled diarrhea.  This started yesterday and has continued through the night.  She estimates that he has had 10 episodes in the last 24 hrs.   He is on pomalyst and has 6 more days in this cycle before his 7 day rest.  Asked her to hold pomalyst today until we could discuss with Dr. Cyndie Chime, but he already took today's dose.  Wife reports that he has had diarrhea once or twice before while on pomalyst, but nothing like this.  Of note, patient is diabetic.  Let her know to make sure patient does not get dehydrated with this diarrhea and to increase fluid intake (but not fruit juices as this may exacerbate diarrhea).  She has not given him anything for the diarrhea.  Will discuss with Dr. Cyndie Chime this am and call her back. Call back is [phone].  (Pt. Is due 8/5 for lab and coumadin clinic and then 8/12 to see Dr. Cyndie Chime) Discussed with Dr. Cyndie Chime:  He is to use Immodium  Every 2 hours with a maximum of 8 in 24 hrs.  He agrees with holding the pomalyst until the diarrhea is resolved.  When I called back and reviewed these instructions with his wife, he had only had one more episode of diarrhea.  Let her know to watch for dehydration and to check his urine.  Make sure he has a good fluid intake and good urine output.  If this does not resolve or he has other sx, she knows to call us.

## 2013-07-14 ENCOUNTER — Telehealth: Payer: Self-pay | Admitting: *Deleted

## 2013-07-14 DIAGNOSIS — T451X5A Adverse effect of antineoplastic and immunosuppressive drugs, initial encounter: Secondary | ICD-10-CM

## 2013-07-14 MED ORDER — PREGABALIN 100 MG PO CAPS: 100.0000 mg | ORAL_CAPSULE | Freq: Every day | ORAL | Status: DC

## 2013-07-14 NOTE — Telephone Encounter (Signed)
Received vm call this am from pt's wife asking for refill on pt's lyrica. Returned call this pm & will refill medication.

## 2013-07-26 ENCOUNTER — Other Ambulatory Visit (HOSPITAL_BASED_OUTPATIENT_CLINIC_OR_DEPARTMENT_OTHER): Payer: Medicare Other

## 2013-07-26 ENCOUNTER — Ambulatory Visit (HOSPITAL_BASED_OUTPATIENT_CLINIC_OR_DEPARTMENT_OTHER): Payer: Medicare Other | Admitting: Pharmacist

## 2013-07-26 DIAGNOSIS — I82409 Acute embolism and thrombosis of unspecified deep veins of unspecified lower extremity: Secondary | ICD-10-CM

## 2013-07-26 DIAGNOSIS — I824Z9 Acute embolism and thrombosis of unspecified deep veins of unspecified distal lower extremity: Secondary | ICD-10-CM

## 2013-07-26 DIAGNOSIS — C9002 Multiple myeloma in relapse: Secondary | ICD-10-CM

## 2013-07-26 DIAGNOSIS — I82401 Acute embolism and thrombosis of unspecified deep veins of right lower extremity: Secondary | ICD-10-CM

## 2013-07-26 LAB — CBC WITH DIFFERENTIAL/PLATELET
Basophils Absolute: 0 10*3/uL (ref 0.0–0.1)
Eosinophils Absolute: 0 10*3/uL (ref 0.0–0.5)
HCT: 38.8 % (ref 38.4–49.9)
HGB: 13.2 g/dL (ref 13.0–17.1)
LYMPH%: 19.4 % (ref 14.0–49.0)
MCV: 97.2 fL (ref 79.3–98.0)
MONO#: 0.5 10*3/uL (ref 0.1–0.9)
MONO%: 11.3 % (ref 0.0–14.0)
NEUT#: 2.9 10*3/uL (ref 1.5–6.5)
NEUT%: 68.8 % (ref 39.0–75.0)
Platelets: 112 10*3/uL — ABNORMAL LOW (ref 140–400)
WBC: 4.2 10*3/uL (ref 4.0–10.3)

## 2013-07-26 LAB — PROTIME-INR: Protime: 30 Seconds — ABNORMAL HIGH (ref 10.6–13.4)

## 2013-07-26 LAB — COMPREHENSIVE METABOLIC PANEL (CC13)
ALT: 11 U/L (ref 0–55)
AST: 11 U/L (ref 5–34)
Albumin: 3.5 g/dL (ref 3.5–5.0)
Alkaline Phosphatase: 138 U/L (ref 40–150)
Glucose: 291 mg/dl — ABNORMAL HIGH (ref 70–140)
Potassium: 4.3 mEq/L (ref 3.5–5.1)
Sodium: 138 mEq/L (ref 136–145)
Total Protein: 6 g/dL — ABNORMAL LOW (ref 6.4–8.3)

## 2013-07-26 NOTE — Patient Instructions (Addendum)
Continue 5mg  (1 tablet daily) except 2.5 mg (1/2 tablet) on Thursday, Saturday, Sunday.  Recheck INR on 08/02/13; lab at 10:45am, coumadin clinic at 11:00am and Dr. Cyndie Chime at 11:30am.

## 2013-07-26 NOTE — Progress Notes (Signed)
INR within goal today. Pt took coumadin as instructed at last visit. No problems to report regarding anticoagulation. Pt does complain about increased fluid retention especially around his abdomen. His furosemide keeps him in the restroom more than he would like. Pt had one episode of diarrhea - now resolved. Pomalyst to begin again on 07/28/13 per wife report. Continue 5mg  (1 tablet daily) except 2.5 mg (1/2 tablet) on Thursday, Saturday, Sunday.  Recheck INR on 08/02/13; lab at 10:45am, coumadin clinic at 11:00am and Dr. Cyndie Chime at 11:30am

## 2013-07-28 LAB — SPEP & IFE WITH QIG
Alpha-2-Globulin: 10.3 % (ref 7.1–11.8)
IgG (Immunoglobin G), Serum: 375 mg/dL — ABNORMAL LOW (ref 650–1600)
M-Spike, %: 0.13 g/dL
Total Protein, Serum Electrophoresis: 5.7 g/dL — ABNORMAL LOW (ref 6.0–8.3)

## 2013-07-28 LAB — KAPPA/LAMBDA LIGHT CHAINS: Kappa free light chain: 0.89 mg/dL (ref 0.33–1.94)

## 2013-08-01 LAB — UIFE/LIGHT CHAINS/TP QN, 24-HR UR
Albumin, U: DETECTED
Free Lambda Excretion/Day: 6360 mg/d
Free Lambda Lt Chains,Ur: 424 mg/dL — ABNORMAL HIGH (ref 0.02–0.67)
Gamma Globulin, Urine: DETECTED — AB

## 2013-08-02 ENCOUNTER — Other Ambulatory Visit (HOSPITAL_BASED_OUTPATIENT_CLINIC_OR_DEPARTMENT_OTHER): Payer: Medicare Other | Admitting: Lab

## 2013-08-02 ENCOUNTER — Ambulatory Visit: Payer: Medicare Other | Admitting: Pharmacist

## 2013-08-02 ENCOUNTER — Ambulatory Visit (HOSPITAL_BASED_OUTPATIENT_CLINIC_OR_DEPARTMENT_OTHER): Payer: Medicare Other | Admitting: Oncology

## 2013-08-02 ENCOUNTER — Telehealth: Payer: Self-pay | Admitting: Oncology

## 2013-08-02 VITALS — BP 136/62 | HR 75 | Temp 97.8°F | Resp 18 | Ht 71.0 in | Wt 236.3 lb

## 2013-08-02 DIAGNOSIS — C7951 Secondary malignant neoplasm of bone: Secondary | ICD-10-CM

## 2013-08-02 DIAGNOSIS — B029 Zoster without complications: Secondary | ICD-10-CM

## 2013-08-02 DIAGNOSIS — I82401 Acute embolism and thrombosis of unspecified deep veins of right lower extremity: Secondary | ICD-10-CM

## 2013-08-02 DIAGNOSIS — C9000 Multiple myeloma not having achieved remission: Secondary | ICD-10-CM

## 2013-08-02 DIAGNOSIS — I82409 Acute embolism and thrombosis of unspecified deep veins of unspecified lower extremity: Secondary | ICD-10-CM

## 2013-08-02 DIAGNOSIS — I82402 Acute embolism and thrombosis of unspecified deep veins of left lower extremity: Secondary | ICD-10-CM

## 2013-08-02 DIAGNOSIS — J029 Acute pharyngitis, unspecified: Secondary | ICD-10-CM

## 2013-08-02 NOTE — Progress Notes (Signed)
INR at goal (3.1 slightly above 3.0). Pt reports no issues regarding bruising or bleeding (1 very small bruise on the arm ~pea size). His only complaint is pain in his abdomen and neck and back mostly when he is bending over or trying to exercise. He reports no missed or extra doses. [name] [name] is also here to see Dr. Cyndie Chime. The plan is to make no changes at this time and to Continue 5mg  (1 tablet daily) except 2.5 mg (1/2 tablet) on Thursday, Saturday, Sunday. Recheck INR on 08/16/13; lab at 11am, coumadin clinic at 11:15am. If INR continues to rise may consider decreasing his dose slightly

## 2013-08-02 NOTE — Progress Notes (Signed)
Hematology and Oncology Follow Up Visit  [name] [name] [id number] [birth year] 72 y.o. 08/02/2013 7:46 PM   Principle Diagnosis: Encounter Diagnoses  Name Primary?  Marland Kitchen DVT of lower extremity (deep venous thrombosis), left Yes  . Herpes zoster infection   . Pharyngitis, acute      Interim History:   Followup visit for this 72 year old man with lambda light chain multiple myeloma. Initial diagnosis June 2010. He had a heavy myeloma burden at time of diagnosis with multiple lytic bone lesions, 87% plasma cells in the bone marrow, and 6.7 g of protein in the urine. 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 He went on to receive high-dose IV melphalan with autologous stem cell support at [facility] 03/12/2010. We began to see small amounts of monoclonal lambda free light chains again in his urine in March of 2012. He was started on Revlimid 10 mg daily beginning in April 2012. Dose had to be adjusted due to myelosuppression down to 5 mg. At time of a visit here in December 2013, he was complaining of recurrent pain in his shoulders, bilateral ribs, and low back. Lab showed rise in his serum and urine paraprotein levels. He was restaged with a bone marrow biopsy on 12/31/2012 which showed 10% plasma cells, rise in total urine protein from 260 mg to 1039 mg, and rise in serum free lambda light chains from 4.8 mg percent in July 2013,to 10 mg percent on October 29, to a 25.8 mg percent by December 16, to 58.5 mg percent by 01/07/2013.  I started him on pomalidomide on 01/28/2013. Initial dose 3 mg 21 days on 7 days rest.  He has tolerated the drug well. No significant exacerbation of pre-existing neuropathy which is worse in his feet than his hands. No GI symptoms. No rash. No significant myelosuppression and his hematologic profile remains stable. He has developed progressive fluid retention from the Decadron initially  given at a dose of 20 mg weekly. I cut the dose down to 8 mg weekly at the time of his March 31 visit here and subsequently down to 5 mg weekly at time of last visit. I was cautiously optimistic that we were starting to see a response. Serum free lambda level rose further to 67 mg percent during the first month on the Pomalyst then started to fall with a value of 50 on April 29. Recent value trending up again and was 57.7 on May 28. A 24-hour urine total protein was 2473 mg on March 17 with recent value on May 1 down to 1858 mg.  Unfortunately, he does appear to be breaking through the pomalidomide. Although his hematologic profile remains stable, there has been a progressive rise in the lambda free light chains now 116 mg percent as of 07/26/2013 compared with 58 on May 28.  24 hour urine total protein is now back up to 6645 mg.  He continues to have atypical right flank pain and right shoulder pain. Pain is worse when he bends over. He continues to have abdominal distention despite the decreased dose of steroids. He has had bouts of diarrhea alternating with constipation. He has never had any previous abdominal surgery. He reports no headache or change in vision. No interim infections and no recent courses of antibiotics.     Medications: reviewed  Allergies: No Known Allergies  Review of Systems: Constitutional:   Chronic fatigue Respiratory: No cough or dyspnea  Cardiovascular:  No chest pain or palpitations Gastrointestinal: See above Genito-Urinary: No urinary tract symptoms Musculoskeletal: See above Neurologic: No change in distal paresthesias of his feet. Skin: No rash or ecchymosis or Remaining ROS negative.  Physical Exam: Blood pressure 136/62, pulse 75, temperature 97.8 F (36.6 C), temperature source Oral, resp. rate 18, height 5\' 11"  (1.803 m), weight 236 lb 4.8 oz (107.185 kg). Wt Readings from Last 3 Encounters:  08/02/13 236 lb 4.8 oz (107.185 kg)  05/31/13 230 lb 8  oz (104.554 kg)  04/25/13 233 lb 4.8 oz (105.824 kg)     General appearance: Well-nourished African American man HENNT: Pharynx no erythema or exudate Lymph nodes: No lymphadenopathy Breasts: Lungs: Clear to auscultation resonant to percussion Heart: Regular rhythm no murmur Abdomen: Soft, moderately distended, nontender, no mass, no organomegaly Extremities: Trace-1+ edema, no calf tenderness Musculoskeletal: no joint deformities GU: Vascular: No cyanosis Neurologic: Motor strength 5 over 5, reflexes absent symmetric at the knees, 1+ symmetric at the biceps, minimal decrease in vibration sensation over the fingertips Skin: No rash or ecchymosis Lab Results: Lab Results  Component Value Date   WBC 4.2 07/26/2013   HGB 13.2 07/26/2013   HCT 38.8 07/26/2013   MCV 97.2 07/26/2013   PLT 112* 07/26/2013     Chemistry      Component Value Date/Time   NA 138 07/26/2013 1133   NA 139 07/20/2012 1123   NA 145 06/13/2009 1503   K 4.3 07/26/2013 1133   K 4.1 07/20/2012 1123   K 4.8* 06/13/2009 1503   CL 104 05/18/2013 1113   CL 106 07/20/2012 1123   CL 108 06/13/2009 1503   CO2 26 07/26/2013 1133   CO2 26 07/20/2012 1123   CO2 29 06/13/2009 1503   BUN 18.5 07/26/2013 1133   BUN 12 07/20/2012 1123   BUN 49* 06/13/2009 1503   CREATININE 1.3 07/26/2013 1133   CREATININE 1.33 07/22/2012 1334   CREATININE 1.47* 07/20/2012 1123   CREATININE 2.9* 06/13/2009 1503      Component Value Date/Time   CALCIUM 9.1 07/26/2013 1133   CALCIUM 8.8 07/20/2012 1123   CALCIUM 10.5* 06/13/2009 1503   ALKPHOS 138 07/26/2013 1133   ALKPHOS 137* 07/20/2012 1123   ALKPHOS 135* 06/13/2009 1503   AST 11 07/26/2013 1133   AST 13 07/20/2012 1123   AST 21 06/13/2009 1503   ALT 11 07/26/2013 1133   ALT <8 07/20/2012 1123   ALT 25 06/13/2009 1503   BILITOT 0.77 07/26/2013 1133   BILITOT 0.8 07/20/2012 1123   BILITOT 0.60 06/13/2009 1503      Impression: #1. Progression of lambda light chain myeloma I again reviewed potential treatment options  that we discussed at his visit here in June. I will see what protocols are available at [facility] but if there is nothing attractive available, I would like to give him a trial of oral Alkeran plus prednisone and reserve kyprolis for future salvage use. I will see him back in 2 weeks.  Plan:  #2.Type 2 diabetes. Exacerbated by steroids. He continues metformin. He is also utilizing insulin. I am tapering him off the steroids. He will discontinue Decadron at this time..  #3. Right lower extremity deep vein thrombosis 05/14/2011. He continues full-dose Coumadin anticoagulation.  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 He continues Lyrica. #5.Essential hypertension. Controlled on current medication.  #6. Parkinson's disease. Decreased tremor on current regimen. #7.. Hyperlipidemia.  #8.Degenerative arthritis.  #9.Chronic renal insufficiency. Creatinine  level currently stable.  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  Direct patient contact time over 45 minutes.  CC:. Dr. Renford Dills; Dr. Verita Schneiders at [facility], [place] 8/12/20147:46 PM

## 2013-08-02 NOTE — Telephone Encounter (Signed)
gv and printed appt sched and avs foro pt °

## 2013-08-02 NOTE — Patient Instructions (Addendum)
INR at goal No chagnes Continue 5mg  (1 tablet daily) except 2.5 mg (1/2 tablet) on Thursday, Saturday, Sunday.  Recheck INR on Tuesday 08/16/13; lab at 11am, coumadin clinic at 11:15am

## 2013-08-04 ENCOUNTER — Telehealth: Payer: Self-pay | Admitting: Pharmacist

## 2013-08-04 NOTE — Telephone Encounter (Signed)
Shirlee Limerick called to let us know that Dr. Cyndie Chime stopped the [facility] and Dex on 08/02/13. Dr. Cyndie Chime is reviewing protocols available at [facility].  If nothing available, pt may begin Alkeran plus prednisone . Will f/u with Dr. Cyndie Chime on 08/16/13. Informed Shirlee Limerick it would be ok to continue same dose of coumadin and recheck INR with next [place] visit on 08/16/13. INR may decrease slightly with the discontinuation of Dex (pt only taking 4mg  once weekly). INR on 08/02/13 = 3.1.

## 2013-08-08 ENCOUNTER — Other Ambulatory Visit: Payer: Self-pay | Admitting: *Deleted

## 2013-08-08 NOTE — Telephone Encounter (Signed)
THIS REFILL REQUEST FOR POMALYST WAS PLACED IN DR.GRANFORTUNA'S ACTIVE WORK BOX. 

## 2013-08-09 ENCOUNTER — Encounter: Payer: Self-pay | Admitting: *Deleted

## 2013-08-16 ENCOUNTER — Ambulatory Visit (HOSPITAL_BASED_OUTPATIENT_CLINIC_OR_DEPARTMENT_OTHER): Payer: Medicare Other | Admitting: Oncology

## 2013-08-16 ENCOUNTER — Telehealth: Payer: Self-pay | Admitting: *Deleted

## 2013-08-16 ENCOUNTER — Ambulatory Visit (HOSPITAL_BASED_OUTPATIENT_CLINIC_OR_DEPARTMENT_OTHER): Payer: Medicare Other | Admitting: Pharmacist

## 2013-08-16 ENCOUNTER — Telehealth: Payer: Self-pay | Admitting: Oncology

## 2013-08-16 ENCOUNTER — Other Ambulatory Visit (HOSPITAL_BASED_OUTPATIENT_CLINIC_OR_DEPARTMENT_OTHER): Payer: Medicare Other | Admitting: Lab

## 2013-08-16 ENCOUNTER — Other Ambulatory Visit: Payer: Self-pay | Admitting: *Deleted

## 2013-08-16 VITALS — BP 102/66 | HR 79 | Temp 97.1°F | Resp 18 | Ht 71.0 in | Wt 232.2 lb

## 2013-08-16 DIAGNOSIS — C9002 Multiple myeloma in relapse: Secondary | ICD-10-CM

## 2013-08-16 DIAGNOSIS — Z7901 Long term (current) use of anticoagulants: Secondary | ICD-10-CM

## 2013-08-16 DIAGNOSIS — Z5181 Encounter for therapeutic drug level monitoring: Secondary | ICD-10-CM

## 2013-08-16 DIAGNOSIS — I82409 Acute embolism and thrombosis of unspecified deep veins of unspecified lower extremity: Secondary | ICD-10-CM

## 2013-08-16 LAB — PROTIME-INR
INR: 3 (ref 2.00–3.50)
Protime: 36 Seconds — ABNORMAL HIGH (ref 10.6–13.4)

## 2013-08-16 LAB — POCT INR: INR: 3

## 2013-08-16 MED ORDER — PREDNISONE 20 MG PO TABS: 80.0000 mg | ORAL_TABLET | Freq: Every day | ORAL | Status: DC

## 2013-08-16 MED ORDER — PREDNISONE 20 MG PO TABS: ORAL_TABLET | ORAL | Status: DC

## 2013-08-16 MED ORDER — ONDANSETRON 8 MG PO TBDP: 8.0000 mg | ORAL_TABLET | Freq: Three times a day (TID) | ORAL | Status: DC | PRN

## 2013-08-16 NOTE — Progress Notes (Signed)
INR remains at goal on current coumadin dose.  [name] [name] will be starting Treanda next week.  He will also be taking prednisone x 4 days after Treanda (9/6-08/30/2013).  Prednisone may interact with coumadin so will see [name] [name] in coumadin clinic in 2 weeks on 09/02/2013.  Continue current coumadin dose.

## 2013-08-16 NOTE — Telephone Encounter (Signed)
gv and pritned appt sched and avs for pt...emaied MW to add tx...will call pt with d/t for tx.Marland Kitchen

## 2013-08-16 NOTE — Telephone Encounter (Signed)
s.w. pt wife and advised on all appts...ok and aware °

## 2013-08-16 NOTE — Telephone Encounter (Signed)
Per staff message and POF I have scheduled appts.  JMW  

## 2013-08-18 ENCOUNTER — Telehealth: Payer: Self-pay | Admitting: *Deleted

## 2013-08-18 NOTE — Telephone Encounter (Signed)
Received vm call from pt's wife stating that pt is having some pain in his right ankle & describes as pins-needle-like pain.  She reports no swelling & he took tylenol.  Returned call & talked with pt & he states pain is not constant & reports no redness or warmth. Tylenol didn't help much.  He does have some percocet at home & informed OK to take.  Encouraged to watch amt of tylenol that he takes b/c percocet has tylenol in it. He will look to see if he has any plain oxycodone. Reported to Dr Cyndie Chime.

## 2013-08-18 NOTE — Progress Notes (Signed)
Hematology and Oncology Follow Up Visit  [name] [name] [id number] 05-10-1941 72 y.o. 08/18/2013 7:31 PM   Principle Diagnosis: Encounter Diagnosis  Name Primary?  . Multiple myeloma in relapse Yes     Interim History:   Short interval followup visit for this 72 year old man with relapsed lambda light chain myeloma initially diagnosed in June 2010. Please see my note from August 12 for full details of previous diagnosis and treatments. He has now developed significant proteinuria again and rise in his serum lambda free light chains as well. We discussed possible salvage regimens 2 weeks ago. I reviewed clinical trials available at [facility] and the currently available trials are all quite involved and would be difficult for him to comply with. Almost all of the programs include drugs he has had already. We spent about 45 minutes discussing other potential regimens. I gave him 2 alternatives either a traditional oral melphalan plus prednisone regimen or a regimen of bendamustine plus prednisone. He is nave to kyprolis but this regimen is also involved and he has a pre-existing neuropathy so I want to keep this drug in reserve. I am looking forward to the availability of an oral second generation proteosome inhibitor which is supposed to have less neuropathy associated with it and is progressing nicely in clinical trials.  In the final analysis, I favor using the bendamustine a combination since he has not been exposed to this drug and he only really got one year after a high dose IV melphalan.  I reviewed the potential side effects with him and his wife and gave them written information. I will start at 70 mg per meter squared day 1 day 2 of a 28 day cycle along with prednisone 80 mg x4 days with each cycle not including the Decadron that he gets as a premedication with the bendamustine.  Medications: reviewed  Allergies: No Known Allergies  Review of Systems: Still significant side effects from  long-term steroids with facial swelling, abdominal bloating and swelling, and peripheral edema. Some improvement as I have tapered the dose of Decadron. Persistent atypical right flank pain radiating up to his shoulder. Persistent distal neuropathy affecting his feet.  Physical Exam: Blood pressure 102/66, pulse 79, temperature 97.1 F (36.2 C), temperature source Oral, resp. rate 18, height 5\' 11"  (1.803 m), weight 232 lb 3.2 oz (105.325 kg). Wt Readings from Last 3 Encounters:  08/16/13 232 lb 3.2 oz (105.325 kg)  08/02/13 236 lb 4.8 oz (107.185 kg)  05/31/13 230 lb 8 oz (104.554 kg)     General appearance: Well-nourished African American man HENNT: Pharynx no erythema or exudate Lymph nodes: No adenopathy Breasts: Lungs: Clear to auscultation resonant to percussion Heart: Regular rhythm no murmur Abdomen: Soft, nontender Extremities: 1+ edema, no calf tenderness Musculoskeletal: No joint deformities GU: Vascular: No cyanosis Neurologic: Motor strength 5 over 5, reflexes absent symmetric Skin: No rash or ecchymosis  Lab Results: Lab Results  Component Value Date   WBC 4.2 07/26/2013   HGB 13.2 07/26/2013   HCT 38.8 07/26/2013   MCV 97.2 07/26/2013   PLT 112* 07/26/2013     Chemistry      Component Value Date/Time   NA 138 07/26/2013 1133   NA 139 07/20/2012 1123   NA 145 06/13/2009 1503   K 4.3 07/26/2013 1133   K 4.1 07/20/2012 1123   K 4.8* 06/13/2009 1503   CL 104 05/18/2013 1113   CL 106 07/20/2012 1123   CL 108 06/13/2009 1503   CO2 26  07/26/2013 1133   CO2 26 07/20/2012 1123   CO2 29 06/13/2009 1503   BUN 18.5 07/26/2013 1133   BUN 12 07/20/2012 1123   BUN 49* 06/13/2009 1503   CREATININE 1.3 07/26/2013 1133   CREATININE 1.33 07/22/2012 1334   CREATININE 1.47* 07/20/2012 1123   CREATININE 2.9* 06/13/2009 1503      Component Value Date/Time   CALCIUM 9.1 07/26/2013 1133   CALCIUM 8.8 07/20/2012 1123   CALCIUM 10.5* 06/13/2009 1503   ALKPHOS 138 07/26/2013 1133   ALKPHOS 137* 07/20/2012  1123   ALKPHOS 135* 06/13/2009 1503   AST 11 07/26/2013 1133   AST 13 07/20/2012 1123   AST 21 06/13/2009 1503   ALT 11 07/26/2013 1133   ALT <8 07/20/2012 1123   ALT 25 06/13/2009 1503   BILITOT 0.77 07/26/2013 1133   BILITOT 0.8 07/20/2012 1123   BILITOT 0.60 06/13/2009 1503       Impression: Relapsed lambda light chain myeloma Plan: As outlined above. Begin first cycle Bendamustine plus prednisone on September 4 and fifth. I will have him take the prednisone starting day 3 through day 7.   CC:.    Levert Feinstein, MD 8/28/20147:31 PM

## 2013-08-19 ENCOUNTER — Other Ambulatory Visit: Payer: Self-pay | Admitting: Oncology

## 2013-08-23 ENCOUNTER — Telehealth: Payer: Self-pay | Admitting: *Deleted

## 2013-08-23 NOTE — Telephone Encounter (Signed)
Wife called to report patient had been in a lot of pain and took #2 Percocet. Shortly afterwards was nauseated and broke out into a "cold sweat". She gave him Zofran ODT and he vomited shortly afterwards. Temp normal. Now is drowsy and seems weak. Made her aware that pain med could have contributed to his nausea as well as if he had let his pain get out of control. The sweat could have preceeded the vomiting episode. Confirmed with her that his blood sugar is around 200-not low. She will have him take clear liquids and very light supper tonight. Suggested she be sure he is on top of his pain and does not let it get out of control before taking something. Always take his pain med w/food. She will call if he has any further problem.

## 2013-08-25 ENCOUNTER — Ambulatory Visit (HOSPITAL_BASED_OUTPATIENT_CLINIC_OR_DEPARTMENT_OTHER): Payer: Medicare Other

## 2013-08-25 ENCOUNTER — Telehealth: Payer: Self-pay | Admitting: Nurse Practitioner

## 2013-08-25 VITALS — BP 116/75 | HR 95 | Temp 98.0°F | Resp 18

## 2013-08-25 DIAGNOSIS — Z5112 Encounter for antineoplastic immunotherapy: Secondary | ICD-10-CM

## 2013-08-25 DIAGNOSIS — C9002 Multiple myeloma in relapse: Secondary | ICD-10-CM

## 2013-08-25 MED ORDER — SODIUM CHLORIDE 0.9 % IV SOLN
Freq: Once | INTRAVENOUS | Status: AC
  Administered 2013-08-25: 15:00:00 via INTRAVENOUS

## 2013-08-25 MED ORDER — ONDANSETRON 8 MG/50ML IVPB (CHCC)
8.0000 mg | Freq: Once | INTRAVENOUS | Status: AC
Start: 2013-08-25 — End: 2013-08-25
  Administered 2013-08-25: 8 mg via INTRAVENOUS

## 2013-08-25 MED ORDER — SODIUM CHLORIDE 0.9 % IV SOLN
70.0000 mg/m2 | Freq: Once | INTRAVENOUS | Status: AC
  Administered 2013-08-25: 160 mg via INTRAVENOUS
  Filled 2013-08-25: qty 32

## 2013-08-25 MED ORDER — DEXAMETHASONE SODIUM PHOSPHATE 10 MG/ML IJ SOLN
10.0000 mg | Freq: Once | INTRAMUSCULAR | Status: AC
  Administered 2013-08-25: 10 mg via INTRAVENOUS

## 2013-08-25 NOTE — Patient Instructions (Addendum)
[facility] Discharge Instructions for Patients Receiving Chemotherapy  Today you received the following chemotherapy agents Treanda.  To help prevent nausea and vomiting after your treatment, we encourage you to take your nausea medication as prescribed. Zofran every 8 hrs as needed. Please start taking your Decadron tablets total of 80mg  (4 tablets) on Saturday 08/27/13.    If you develop nausea and vomiting that is not controlled by your nausea medication, call the clinic.   BELOW ARE SYMPTOMS THAT SHOULD BE REPORTED IMMEDIATELY:  *FEVER GREATER THAN 100.5 F  *CHILLS WITH OR WITHOUT FEVER  NAUSEA AND VOMITING THAT IS NOT CONTROLLED WITH YOUR NAUSEA MEDICATION  *UNUSUAL SHORTNESS OF BREATH  *UNUSUAL BRUISING OR BLEEDING  TENDERNESS IN MOUTH AND THROAT WITH OR WITHOUT PRESENCE OF ULCERS  *URINARY PROBLEMS  *BOWEL PROBLEMS  UNUSUAL RASH Items with * indicate a potential emergency and should be followed up as soon as possible.  Feel free to call the clinic you have any questions or concerns. The clinic phone number is [phone].     Bendamustine Injection What is this medicine? BENDAMUSTINE (BEN da MUS teen) is a chemotherapy drug. It is used to treat chronic lymphocytic leukemia and non-Hodgkin lymphoma. This medicine may be used for other purposes; ask your health care provider or pharmacist if you have questions. What should I tell my health care provider before I take this medicine? They need to know if you have any of these conditions: -kidney disease -liver disease -an unusual or allergic reaction to bendamustine, mannitol, other medicines, foods, dyes, or preservatives -pregnant or trying to get pregnant -breast-feeding How should I use this medicine? This medicine is for infusion into a vein. It is given by a health care professional in a hospital or clinic setting. Talk to your pediatrician regarding the use of this medicine in children.  Special care may be needed. Overdosage: If you think you have taken too much of this medicine contact a poison control center or emergency room at once. NOTE: This medicine is only for you. Do not share this medicine with others. What if I miss a dose? It is important not to miss your dose. Call your doctor or health care professional if you are unable to keep an appointment. What may interact with this medicine? Do not take this medicine with any of the following medications: -clozapine This medicine may also interact with the following medications: -atazanavir -cimetidine -ciprofloxacin -enoxacin -fluvoxamine -medicines for seizures like carbamazepine and phenobarbital -mexiletine -rifampin -tacrine -thiabendazole -zileuton This list may not describe all possible interactions. Give your health care provider a list of all the medicines, herbs, non-prescription drugs, or dietary supplements you use. Also tell them if you smoke, drink alcohol, or use illegal drugs. Some items may interact with your medicine. What should I watch for while using this medicine? Your condition will be monitored carefully while you are receiving this medicine. This drug may make you feel generally unwell. This is not uncommon, as chemotherapy can affect healthy cells as well as cancer cells. Report any side effects. Continue your course of treatment even though you feel ill unless your doctor tells you to stop. Call your doctor or health care professional for advice if you get a fever, chills or sore throat, or other symptoms of a cold or flu. Do not treat yourself. This drug decreases your body's ability to fight infections. Try to avoid being around people who are sick. This medicine may increase your risk to bruise  or bleed. Call your doctor or health care professional if you notice any unusual bleeding. Be careful brushing and flossing your teeth or using a toothpick because you may get an infection or bleed  more easily. If you have any dental work done, tell your dentist you are receiving this medicine. Avoid taking products that contain aspirin, acetaminophen, ibuprofen, naproxen, or ketoprofen unless instructed by your doctor. These medicines may hide a fever. Do not become pregnant while taking this medicine. Women should inform their doctor if they wish to become pregnant or think they might be pregnant. There is a potential for serious side effects to an unborn child. Men should inform their doctors if they wish to father a child. This medicine may lower sperm counts. Talk to your health care professional or pharmacist for more information. Do not breast-feed an infant while taking this medicine. What side effects may I notice from receiving this medicine? Side effects that you should report to your doctor or health care professional as soon as possible: -allergic reactions like skin rash, itching or hives, swelling of the face, lips, or tongue -low blood counts - this medicine may decrease the number of white blood cells, red blood cells and platelets. You may be at increased risk for infections and bleeding. -signs of infection - fever or chills, cough, sore throat, pain or difficulty passing urine -signs of decreased platelets or bleeding - bruising, pinpoint red spots on the skin, black, tarry stools, blood in the urine -signs of decreased red blood cells - unusually weak or tired, fainting spells, lightheadedness -trouble passing urine or change in the amount of urine Side effects that usually do not require medical attention (report to your doctor or health care professional if they continue or are bothersome): -diarrhea This list may not describe all possible side effects. Call your doctor for medical advice about side effects. You may report side effects to FDA at 1-800-FDA-1088. Where should I keep my medicine? This drug is given in a hospital or clinic and will not be stored at  home. NOTE: This sheet is a summary. It may not cover all possible information. If you have questions about this medicine, talk to your doctor, pharmacist, or health care provider.  2013, [employer] Standard. (03/05/2012 2:15:47 PM)

## 2013-08-26 ENCOUNTER — Ambulatory Visit (HOSPITAL_BASED_OUTPATIENT_CLINIC_OR_DEPARTMENT_OTHER): Payer: Medicare Other

## 2013-08-26 VITALS — BP 148/78 | HR 87 | Temp 96.9°F

## 2013-08-26 DIAGNOSIS — Z5111 Encounter for antineoplastic chemotherapy: Secondary | ICD-10-CM

## 2013-08-26 DIAGNOSIS — C9002 Multiple myeloma in relapse: Secondary | ICD-10-CM

## 2013-08-26 MED ORDER — DEXAMETHASONE SODIUM PHOSPHATE 10 MG/ML IJ SOLN
INTRAMUSCULAR | Status: AC
  Filled 2013-08-26: qty 1

## 2013-08-26 MED ORDER — SODIUM CHLORIDE 0.9 % IV SOLN
Freq: Once | INTRAVENOUS | Status: AC
  Administered 2013-08-26: 14:00:00 via INTRAVENOUS

## 2013-08-26 MED ORDER — DEXAMETHASONE SODIUM PHOSPHATE 10 MG/ML IJ SOLN
10.0000 mg | Freq: Once | INTRAMUSCULAR | Status: AC
  Administered 2013-08-26: 10 mg via INTRAVENOUS

## 2013-08-26 MED ORDER — ONDANSETRON 8 MG/NS 50 ML IVPB
INTRAVENOUS | Status: AC
  Filled 2013-08-26: qty 8

## 2013-08-26 MED ORDER — ONDANSETRON 8 MG/50ML IVPB (CHCC)
8.0000 mg | Freq: Once | INTRAVENOUS | Status: AC
  Administered 2013-08-26: 8 mg via INTRAVENOUS

## 2013-08-26 MED ORDER — BENDAMUSTINE HCL (LYOPHILIZED PWD) CHEMO INJECTION 100MG
70.0000 mg/m2 | Freq: Once | INTRAVENOUS | Status: AC
  Administered 2013-08-26: 160 mg via INTRAVENOUS
  Filled 2013-08-26: qty 32

## 2013-08-26 NOTE — Patient Instructions (Addendum)
[facility] Discharge Instructions for Patients Receiving Chemotherapy  Today you received the following chemotherapy agents: Treanda.  To help prevent nausea and vomiting after your treatment, we encourage you to take your nausea medication as prescribed.   If you develop nausea and vomiting that is not controlled by your nausea medication, call the clinic.   BELOW ARE SYMPTOMS THAT SHOULD BE REPORTED IMMEDIATELY:  *FEVER GREATER THAN 100.5 F  *CHILLS WITH OR WITHOUT FEVER  NAUSEA AND VOMITING THAT IS NOT CONTROLLED WITH YOUR NAUSEA MEDICATION  *UNUSUAL SHORTNESS OF BREATH  *UNUSUAL BRUISING OR BLEEDING  TENDERNESS IN MOUTH AND THROAT WITH OR WITHOUT PRESENCE OF ULCERS  *URINARY PROBLEMS  *BOWEL PROBLEMS  UNUSUAL RASH Items with * indicate a potential emergency and should be followed up as soon as possible.  Feel free to call the clinic you have any questions or concerns. The clinic phone number is [phone].    

## 2013-08-29 ENCOUNTER — Telehealth: Payer: Self-pay | Admitting: *Deleted

## 2013-08-29 NOTE — Telephone Encounter (Signed)
Called [name] [name] for chemotherapy F/U.  Patient is doing well.  Denies n/v.  Denies any new side effects or symptoms.  Bowel is functioning well.  Noted call that he has a few drops of blood when he urinates.  Eating and drinking well and I instructed to drink 64 oz minimum daily or at least the day before, of and after treatment.  Denies pain.  Reports he is sleeping well.  No questions at this time and encouraged to call if needed.  Reviewed how to call after hours in the case of an emergency.

## 2013-08-29 NOTE — Telephone Encounter (Signed)
Received vm call from pt's wife stating that pt had some blood in his urine sat & sun but only drops of blood at beginning of urination & stained his underwear.  She didn't think he had any today.  Returned call & talked with pt.  NO frank bleeding, only drops of blood at start of urination on Sat & Sun but none today.  He reports light stinging with urination & frequency but is also on lasix.  He thinks he is urinating more frequent than usual.  He also has back pain which he states is no different than usual.  Note to Dr Cyndie Chime.

## 2013-08-29 NOTE — Telephone Encounter (Signed)
Message copied by Augusto Garbe on Mon Aug 29, 2013  1:38 PM ------      Message from: [place], ATHENA N      Created: Thu Aug 25, 2013  4:00 PM      Regarding: Chemo Follow up Call      Contact: [phone]       First time Treanda. Pt of Dr. Cyndie Chime.  ------

## 2013-08-30 ENCOUNTER — Other Ambulatory Visit: Payer: Self-pay | Admitting: *Deleted

## 2013-08-30 ENCOUNTER — Telehealth: Payer: Self-pay | Admitting: *Deleted

## 2013-08-30 DIAGNOSIS — N390 Urinary tract infection, site not specified: Secondary | ICD-10-CM

## 2013-08-30 NOTE — Telephone Encounter (Signed)
Called & spoke with pt's wife & informed per Dr Cyndie Chime that sometimes we see a little blood in the urine on coumadin pts but we can have pt come in for a u/a & c&s if he wants to come in tomorrow.  She reports that he has an appt on fri & will do urine check then unless his symptoms get worse.  POF done

## 2013-09-02 ENCOUNTER — Ambulatory Visit (HOSPITAL_BASED_OUTPATIENT_CLINIC_OR_DEPARTMENT_OTHER): Payer: Medicare Other | Admitting: Pharmacist

## 2013-09-02 ENCOUNTER — Other Ambulatory Visit (HOSPITAL_BASED_OUTPATIENT_CLINIC_OR_DEPARTMENT_OTHER): Payer: Medicare Other | Admitting: Lab

## 2013-09-02 DIAGNOSIS — I82409 Acute embolism and thrombosis of unspecified deep veins of unspecified lower extremity: Secondary | ICD-10-CM

## 2013-09-02 DIAGNOSIS — I82401 Acute embolism and thrombosis of unspecified deep veins of right lower extremity: Secondary | ICD-10-CM

## 2013-09-02 DIAGNOSIS — N390 Urinary tract infection, site not specified: Secondary | ICD-10-CM

## 2013-09-02 LAB — URINALYSIS, MICROSCOPIC - CHCC
Leukocyte Esterase: NEGATIVE
Nitrite: NEGATIVE
Protein: 30 mg/dL
Urobilinogen, UR: 0.2 mg/dL (ref 0.2–1)

## 2013-09-02 LAB — PROTIME-INR

## 2013-09-02 NOTE — Progress Notes (Signed)
INR supratherapeutic. Pt was started on Prednisone 80mg  daily x 4 days starting the day after chemotherapy. Pt received Treanda about 1 week ago on 9/4 and 9/5. No interaction noted with warfarin. Pt also received Dexamethasone 10mg  IV with each Treanda treatment. Decreased appetite. Pt is eating less amounts.  Pt noted trace amounts of blood in urine for about 3 days. No further blood noted since 08/30/13. MD aware. Urine C/S done today. Hold coumadin today (9/12), tomorrow (9/13) and Sunday (9/14).  Recheck INR on 09/05/13; lab at 11am and coumadin clinic at 11:15am.

## 2013-09-02 NOTE — Patient Instructions (Addendum)
Hold coumadin today (9/12), tomorrow (9/13) and Sunday (9/14).  Recheck INR on 09/05/13; lab at 11am and coumadin clinic at 11:15am.

## 2013-09-03 LAB — URINE CULTURE

## 2013-09-05 ENCOUNTER — Ambulatory Visit (HOSPITAL_BASED_OUTPATIENT_CLINIC_OR_DEPARTMENT_OTHER): Payer: Medicare Other | Admitting: Pharmacist

## 2013-09-05 ENCOUNTER — Other Ambulatory Visit: Payer: Medicare Other | Admitting: Lab

## 2013-09-05 DIAGNOSIS — I82409 Acute embolism and thrombosis of unspecified deep veins of unspecified lower extremity: Secondary | ICD-10-CM

## 2013-09-05 DIAGNOSIS — I82401 Acute embolism and thrombosis of unspecified deep veins of right lower extremity: Secondary | ICD-10-CM

## 2013-09-05 LAB — PROTIME-INR: INR: 1.7 — ABNORMAL LOW (ref 2.00–3.50)

## 2013-09-05 NOTE — Patient Instructions (Addendum)
Resume coumadin today.  Begin 2.5mg  daily except 5mg  on Mondays.  Recheck INR with scheduled appts on 09/08/13: lab at 9:45am, Lonna Cobb at 10:15am and Coumadin clinic at 10:45am.

## 2013-09-05 NOTE — Progress Notes (Signed)
INR near goal range. No problems to report regarding anticoagulation. No s/s of clotting. Coumadin on hold secondary to elevated INR on 9/12. (Elevated INR likely due to steroids and decreased appetite/food intake after chemotherapy on 9/4 and 9/5). Coumadin held 9/12-9/14. No changes in medications. Appetite increased over the weekend. Pt eating more, especially if his wife cooks something he really wants. Will resume coumadin today.  Begin 2.5mg  daily except 5mg  on Mondays.  Recheck INR with scheduled lab and Lonna Cobb appt on 09/08/13: lab at 9:45am, Lonna Cobb at 10:15am and Coumadin clinic at 10:45am.

## 2013-09-08 ENCOUNTER — Telehealth: Payer: Self-pay | Admitting: *Deleted

## 2013-09-08 ENCOUNTER — Ambulatory Visit (HOSPITAL_BASED_OUTPATIENT_CLINIC_OR_DEPARTMENT_OTHER): Payer: Medicare Other | Admitting: Pharmacist

## 2013-09-08 ENCOUNTER — Ambulatory Visit (HOSPITAL_BASED_OUTPATIENT_CLINIC_OR_DEPARTMENT_OTHER): Payer: Medicare Other | Admitting: Nurse Practitioner

## 2013-09-08 ENCOUNTER — Other Ambulatory Visit (HOSPITAL_BASED_OUTPATIENT_CLINIC_OR_DEPARTMENT_OTHER): Payer: Medicare Other | Admitting: Lab

## 2013-09-08 VITALS — BP 92/68 | HR 77 | Temp 97.3°F | Resp 20 | Ht 71.0 in | Wt 229.4 lb

## 2013-09-08 DIAGNOSIS — I82409 Acute embolism and thrombosis of unspecified deep veins of unspecified lower extremity: Secondary | ICD-10-CM

## 2013-09-08 DIAGNOSIS — C9002 Multiple myeloma in relapse: Secondary | ICD-10-CM

## 2013-09-08 DIAGNOSIS — I959 Hypotension, unspecified: Secondary | ICD-10-CM

## 2013-09-08 LAB — COMPREHENSIVE METABOLIC PANEL (CC13)
Albumin: 3.2 g/dL — ABNORMAL LOW (ref 3.5–5.0)
BUN: 12 mg/dL (ref 7.0–26.0)
Calcium: 8.7 mg/dL (ref 8.4–10.4)
Chloride: 103 mEq/L (ref 98–109)
Glucose: 214 mg/dl — ABNORMAL HIGH (ref 70–140)
Potassium: 4.2 mEq/L (ref 3.5–5.1)

## 2013-09-08 LAB — CBC WITH DIFFERENTIAL/PLATELET
Basophils Absolute: 0 10*3/uL (ref 0.0–0.1)
Eosinophils Absolute: 0 10*3/uL (ref 0.0–0.5)
HGB: 13.8 g/dL (ref 13.0–17.1)
MCV: 97.2 fL (ref 79.3–98.0)
MONO%: 12 % (ref 0.0–14.0)
NEUT#: 2.9 10*3/uL (ref 1.5–6.5)
RDW: 15.1 % — ABNORMAL HIGH (ref 11.0–14.6)
lymph#: 0.3 10*3/uL — ABNORMAL LOW (ref 0.9–3.3)

## 2013-09-08 LAB — PROTIME-INR: INR: 1.5 — ABNORMAL LOW (ref 2.00–3.50)

## 2013-09-08 LAB — POCT INR: INR: 1.5

## 2013-09-08 NOTE — Telephone Encounter (Signed)
Ms Bartolo was notified of negative urine culture per Dr. Cyndie Chime.

## 2013-09-08 NOTE — Progress Notes (Signed)
OFFICE PROGRESS NOTE  Interval history:  [name] is a 72 year old man with relapsed lambda light chain multiple myeloma. He began treatment with bendamustine 70 mg per meter squared day 1 and day 2 of a 28 day cycle and prednisone 80 mg daily for 4 days on 08/25/2013. He is seen today for scheduled follow-up.   He notes increased weakness since the bendamustine. He had mild nausea. No vomiting. No mouth sores. No diarrhea. He has had previous problems with constipation. He does note bowels are moving on a more regular schedule. He denies shortness of breath. He has an occasional cough. No fever. No shaking chills. Stable neuropathy symptoms in the lower legs and feet. Stable chest/side pain. No skin rash. No bleeding except a small amount of blood with urination when his INR was elevated. No dysuria. Overall good appetite though his wife did note some decrease following bendamustine. He feels he is taking in adequate fluids.   Objective: Blood pressure 92/68, pulse 77, temperature 97.3 F (36.3 C), temperature source Oral, resp. rate 20, height 5\' 11"  (1.803 m), weight 229 lb 6.4 oz (104.055 kg).  Well appearing man in no acute distress. Pupils equal round and reactive to light. Extraocular movements intact. Sclera anicteric. Oropharynx without thrush or ulceration. Mucous membranes are moist. No palpable cervical, supraclavicular or axillary lymph nodes. Lungs are clear. No wheezes or rales. Regular cardiac rhythm. No murmur. Abdomen is soft and nontender. No organomegaly. Extremities are without edema. Motor strength 5 over 5. Knee DTRs absent symmetrically.  Lab Results: Lab Results  Component Value Date   WBC 3.8* 09/08/2013   HGB 13.8 09/08/2013   HCT 40.9 09/08/2013   MCV 97.2 09/08/2013   PLT 74* 09/08/2013    Chemistry:    Chemistry      Component Value Date/Time   NA 139 09/08/2013 0930   NA 139 07/20/2012 1123   NA 145 06/13/2009 1503   K 4.2 09/08/2013 0930   K 4.1 07/20/2012 1123    K 4.8* 06/13/2009 1503   CL 104 05/18/2013 1113   CL 106 07/20/2012 1123   CL 108 06/13/2009 1503   CO2 30* 09/08/2013 0930   CO2 26 07/20/2012 1123   CO2 29 06/13/2009 1503   BUN 12.0 09/08/2013 0930   BUN 12 07/20/2012 1123   BUN 49* 06/13/2009 1503   CREATININE 1.4* 09/08/2013 0930   CREATININE 1.33 07/22/2012 1334   CREATININE 1.47* 07/20/2012 1123   CREATININE 2.9* 06/13/2009 1503      Component Value Date/Time   CALCIUM 8.7 09/08/2013 0930   CALCIUM 8.8 07/20/2012 1123   CALCIUM 10.5* 06/13/2009 1503   ALKPHOS 151* 09/08/2013 0930   ALKPHOS 137* 07/20/2012 1123   ALKPHOS 135* 06/13/2009 1503   AST 16 09/08/2013 0930   AST 13 07/20/2012 1123   AST 21 06/13/2009 1503   ALT 12 09/08/2013 0930   ALT <8 07/20/2012 1123   ALT 25 06/13/2009 1503   BILITOT 0.91 09/08/2013 0930   BILITOT 0.8 07/20/2012 1123   BILITOT 0.60 06/13/2009 1503       Studies/Results: No results found.  Medications: I have reviewed the patient's current medications.  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 He was found to have multiple lytic bone lesions. Serum immunoglobulins were all suppressed. He had monoclonal lambda light chains on immunofixation electrophoresis. There was marked elevation of serum free lambda light chains at 3.5 g. Initial urine  showed 6.7 g of protein, primarily free lambda light chains. Bone marrow biopsy showed 87% plasma cells. He was induced into a remission with a combination of Velcade plus dexamethasone with a subsequent addition of Revlimid. Velcade was discontinued due to progressive neuropathy. He went on to receive IV melphalan with autologous bone marrow support at [facility] on 03/12/2010. He remained off of treatment until a small amount of monoclonal lambda free light chain was again found in the urine in March 2012. He was started on Revlimid 10 mg daily beginning 04/09/2011. The Revlimid schedule was changed to 21 days on  followed by a 7 day break at the same dose of 10 mg in August 2012 due to myelosuppression. Due to myelosuppression the Revlimid dose was decreased to 5 mg daily 3 weeks on/1 week off following an office visit on 03/26/2012. Serum lambda free light chains returned at 9.98 on 10/19/2012 as compared to 4.85 on 07/20/2012. There was further increase of the lambda free light chains on 11/30/2012 to 25.8. 24-hour urine total protein returned at 1039 mg which was up from baseline of 260 mg. Bone survey 10/19/2012 showed multiple lesions but no new or unstable lesions. Bone marrow aspiration/biopsy done 12/30/2012 showed 10% plasma cells. Pomalidomide cycle 1 initiated on 03/09/2013. He began cycle 2 on 04/06/2013. Serum lambda light chains improved on 04/19/2013. Progressive rise in the lambda free light chains as of 07/26/2013 and rise in 24-hour urine total protein. Treatment initiated with bendamustine/prednisone 08/25/2013. 2. Type 2 diabetes. Exacerbated by steroids. He continues metformin. He is also utilizing insulin. He is followed by Dr. Nehemiah Settle. 3. Right lower extremity deep vein thrombosis 05/14/2011. He continues full-dose Coumadin anticoagulation. 4. 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 He continues Lyrica. 5. Essential hypertension. Currently on Diovan. 6. Parkinson's disease.  7. Recent bronchitis. 8. Hyperlipidemia. 9. Degenerative arthritis. 10. Chronic renal insufficiency.  11. 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 12. Hypotension in the office today. He does not appear dehydrated or acutely ill. We placed the Diovan on hold.   Disposition-Mr. Espin has completed one cycle of bendamustine/prednisone. Overall he appears to have tolerated treatment well. He will return for cycle 2 on 09/22/2013.  As noted above he is hypotensive in the office today. Question etiology. He does not appear  dehydrated or acutely ill. We placed Diovan on hold. His wife will check his blood pressure at home and contact us with persistent low readings. In addition, she understands to call the office with fever, shaking chills, other signs of infection.  He has a followup visit scheduled on 10/04/2013.  Plan reviewed with Dr. Cyndie Chime.  Lonna Cobb ANP/GNP-BC

## 2013-09-08 NOTE — Progress Notes (Signed)
INR = 1.5 No doses missed. Patient has not been eating as well as usual since starting Treanda. Patient receives dexamethasone day of treatment and takes prednisone for 4 days following Treanda. INR elevated following first cycle. No bleeding or bruising noted. Per Dr. Cyndie Chime, patient will discontinue Coumadin since he is no longer taking Revlimid or Pomalyst.  Patient had DVT while on Revlimid, now has no indication for Coumadin. Cletis Athens, PharmD

## 2013-09-08 NOTE — Patient Instructions (Signed)
Stop Diovan for now because your blood pressure is low.

## 2013-09-08 NOTE — Telephone Encounter (Signed)
Message copied by Sabino Snipes on Thu Sep 08, 2013  5:12 PM ------      Message from: Levert Feinstein      Created: Sun Sep 04, 2013 12:27 AM       Call pt: urine culture no growth of bacteria ------

## 2013-09-09 ENCOUNTER — Telehealth: Payer: Self-pay | Admitting: Oncology

## 2013-09-09 NOTE — Telephone Encounter (Signed)
Talked to pt wife and gave her appt for 10/2 and rest of October, emailed [place] regardign chemo

## 2013-09-12 ENCOUNTER — Telehealth: Payer: Self-pay | Admitting: *Deleted

## 2013-09-12 NOTE — Telephone Encounter (Signed)
Per staff message and POF I have scheduled appts.  JMW  

## 2013-09-13 ENCOUNTER — Telehealth: Payer: Self-pay | Admitting: Oncology

## 2013-09-13 NOTE — Telephone Encounter (Signed)
Talked to pt and he is aware of all appt up to November 2014

## 2013-09-22 ENCOUNTER — Other Ambulatory Visit (HOSPITAL_BASED_OUTPATIENT_CLINIC_OR_DEPARTMENT_OTHER): Payer: Medicare Other | Admitting: Lab

## 2013-09-22 ENCOUNTER — Other Ambulatory Visit: Payer: Self-pay | Admitting: Oncology

## 2013-09-22 ENCOUNTER — Ambulatory Visit (HOSPITAL_BASED_OUTPATIENT_CLINIC_OR_DEPARTMENT_OTHER): Payer: Medicare Other

## 2013-09-22 VITALS — BP 105/72 | HR 79 | Temp 98.0°F | Resp 20

## 2013-09-22 DIAGNOSIS — C9002 Multiple myeloma in relapse: Secondary | ICD-10-CM

## 2013-09-22 DIAGNOSIS — Z5111 Encounter for antineoplastic chemotherapy: Secondary | ICD-10-CM

## 2013-09-22 LAB — COMPREHENSIVE METABOLIC PANEL (CC13)
AST: 19 U/L (ref 5–34)
BUN: 10.4 mg/dL (ref 7.0–26.0)
Calcium: 8.6 mg/dL (ref 8.4–10.4)
Chloride: 105 mEq/L (ref 98–109)
Creatinine: 1.2 mg/dL (ref 0.7–1.3)
Glucose: 242 mg/dl — ABNORMAL HIGH (ref 70–140)

## 2013-09-22 LAB — CBC WITH DIFFERENTIAL/PLATELET
BASO%: 0.5 % (ref 0.0–2.0)
Basophils Absolute: 0 10*3/uL (ref 0.0–0.1)
EOS%: 1 % (ref 0.0–7.0)
HCT: 38.6 % (ref 38.4–49.9)
HGB: 13.3 g/dL (ref 13.0–17.1)
LYMPH%: 56.5 % — ABNORMAL HIGH (ref 14.0–49.0)
MCH: 32.3 pg (ref 27.2–33.4)
MCHC: 34.5 g/dL (ref 32.0–36.0)
MONO#: 0.3 10*3/uL (ref 0.1–0.9)
NEUT%: 33.4 % — ABNORMAL LOW (ref 39.0–75.0)
Platelets: 89 10*3/uL — ABNORMAL LOW (ref 140–400)

## 2013-09-22 MED ORDER — DEXAMETHASONE SODIUM PHOSPHATE 10 MG/ML IJ SOLN
INTRAMUSCULAR | Status: AC
  Filled 2013-09-22: qty 1

## 2013-09-22 MED ORDER — SODIUM CHLORIDE 0.9 % IV SOLN
Freq: Once | INTRAVENOUS | Status: AC
  Administered 2013-09-22: 11:00:00 via INTRAVENOUS

## 2013-09-22 MED ORDER — ONDANSETRON 8 MG/50ML IVPB (CHCC)
8.0000 mg | Freq: Once | INTRAVENOUS | Status: AC
  Administered 2013-09-22: 8 mg via INTRAVENOUS

## 2013-09-22 MED ORDER — SODIUM CHLORIDE 0.9 % IV SOLN
70.0000 mg/m2 | Freq: Once | INTRAVENOUS | Status: AC
  Administered 2013-09-22: 160 mg via INTRAVENOUS
  Filled 2013-09-22: qty 32

## 2013-09-22 MED ORDER — ONDANSETRON 8 MG/NS 50 ML IVPB
INTRAVENOUS | Status: AC
  Filled 2013-09-22: qty 8

## 2013-09-22 MED ORDER — DEXAMETHASONE SODIUM PHOSPHATE 10 MG/ML IJ SOLN
10.0000 mg | Freq: Once | INTRAMUSCULAR | Status: AC
  Administered 2013-09-22: 10 mg via INTRAVENOUS

## 2013-09-22 MED ORDER — DEXAMETHASONE SODIUM PHOSPHATE 20 MG/5ML IJ SOLN
INTRAMUSCULAR | Status: AC
  Filled 2013-09-22: qty 5

## 2013-09-22 NOTE — Progress Notes (Signed)
Per Dr. Cyndie Chime, OK to treat today with Neut 1.3 and Plts 89. This RN reviewed labs with pt and family and educated them on neutropenia and thrombocytopenia precautions. Patient and his wife verbalize understanding. Clayborn Heron, RN

## 2013-09-22 NOTE — Patient Instructions (Addendum)
[facility] Discharge Instructions for Patients Receiving Chemotherapy  Today you received the following chemotherapy agents: Treanda.  To help prevent nausea and vomiting after your treatment, we encourage you to take your nausea medication as prescribed.   If you develop nausea and vomiting that is not controlled by your nausea medication, call the clinic.   BELOW ARE SYMPTOMS THAT SHOULD BE REPORTED IMMEDIATELY:  *FEVER GREATER THAN 100.5 F  *CHILLS WITH OR WITHOUT FEVER  NAUSEA AND VOMITING THAT IS NOT CONTROLLED WITH YOUR NAUSEA MEDICATION  *UNUSUAL SHORTNESS OF BREATH  *UNUSUAL BRUISING OR BLEEDING  TENDERNESS IN MOUTH AND THROAT WITH OR WITHOUT PRESENCE OF ULCERS  *URINARY PROBLEMS  *BOWEL PROBLEMS  UNUSUAL RASH Items with * indicate a potential emergency and should be followed up as soon as possible.  Feel free to call the clinic you have any questions or concerns. The clinic phone number is [phone].    

## 2013-09-23 ENCOUNTER — Ambulatory Visit (HOSPITAL_BASED_OUTPATIENT_CLINIC_OR_DEPARTMENT_OTHER): Payer: Medicare Other

## 2013-09-23 VITALS — BP 124/67 | HR 81 | Temp 98.0°F | Resp 18

## 2013-09-23 DIAGNOSIS — Z5111 Encounter for antineoplastic chemotherapy: Secondary | ICD-10-CM

## 2013-09-23 DIAGNOSIS — C9002 Multiple myeloma in relapse: Secondary | ICD-10-CM

## 2013-09-23 LAB — KAPPA/LAMBDA LIGHT CHAINS
Kappa:Lambda Ratio: 0 — ABNORMAL LOW (ref 0.26–1.65)
Lambda Free Lght Chn: 167 mg/dL — ABNORMAL HIGH (ref 0.57–2.63)

## 2013-09-23 LAB — IGG: IgG (Immunoglobin G), Serum: 331 mg/dL — ABNORMAL LOW (ref 650–1600)

## 2013-09-23 MED ORDER — ONDANSETRON 8 MG/50ML IVPB (CHCC)
8.0000 mg | Freq: Once | INTRAVENOUS | Status: AC
  Administered 2013-09-23: 8 mg via INTRAVENOUS

## 2013-09-23 MED ORDER — DEXAMETHASONE SODIUM PHOSPHATE 10 MG/ML IJ SOLN
10.0000 mg | Freq: Once | INTRAMUSCULAR | Status: AC
  Administered 2013-09-23: 10 mg via INTRAVENOUS

## 2013-09-23 MED ORDER — SODIUM CHLORIDE 0.9 % IV SOLN
Freq: Once | INTRAVENOUS | Status: AC
  Administered 2013-09-23: 11:00:00 via INTRAVENOUS

## 2013-09-23 MED ORDER — ONDANSETRON 8 MG/NS 50 ML IVPB
INTRAVENOUS | Status: AC
  Filled 2013-09-23: qty 8

## 2013-09-23 MED ORDER — SODIUM CHLORIDE 0.9 % IV SOLN
70.0000 mg/m2 | Freq: Once | INTRAVENOUS | Status: AC
  Administered 2013-09-23: 160 mg via INTRAVENOUS
  Filled 2013-09-23: qty 32

## 2013-09-23 MED ORDER — DEXAMETHASONE SODIUM PHOSPHATE 10 MG/ML IJ SOLN
INTRAMUSCULAR | Status: AC
  Filled 2013-09-23: qty 1

## 2013-09-23 NOTE — Patient Instructions (Signed)
[facility] Discharge Instructions for Patients Receiving Chemotherapy  Today you received the following chemotherapy agents Treanda  To help prevent nausea and vomiting after your treatment, we encourage you to take your nausea medication as ordered   If you develop nausea and vomiting that is not controlled by your nausea medication, call the clinic.   BELOW ARE SYMPTOMS THAT SHOULD BE REPORTED IMMEDIATELY:  *FEVER GREATER THAN 100.5 F  *CHILLS WITH OR WITHOUT FEVER  NAUSEA AND VOMITING THAT IS NOT CONTROLLED WITH YOUR NAUSEA MEDICATION  *UNUSUAL SHORTNESS OF BREATH  *UNUSUAL BRUISING OR BLEEDING  TENDERNESS IN MOUTH AND THROAT WITH OR WITHOUT PRESENCE OF ULCERS  *URINARY PROBLEMS  *BOWEL PROBLEMS  UNUSUAL RASH Items with * indicate a potential emergency and should be followed up as soon as possible.  Feel free to call the clinic you have any questions or concerns. The clinic phone number is [phone].    

## 2013-10-04 ENCOUNTER — Encounter: Payer: Self-pay | Admitting: Oncology

## 2013-10-04 ENCOUNTER — Telehealth: Payer: Self-pay | Admitting: Oncology

## 2013-10-04 ENCOUNTER — Encounter (INDEPENDENT_AMBULATORY_CARE_PROVIDER_SITE_OTHER): Payer: Self-pay

## 2013-10-04 ENCOUNTER — Ambulatory Visit (HOSPITAL_BASED_OUTPATIENT_CLINIC_OR_DEPARTMENT_OTHER): Payer: Medicare Other | Admitting: Oncology

## 2013-10-04 ENCOUNTER — Ambulatory Visit: Payer: Medicare Other | Admitting: Lab

## 2013-10-04 ENCOUNTER — Other Ambulatory Visit (HOSPITAL_BASED_OUTPATIENT_CLINIC_OR_DEPARTMENT_OTHER): Payer: Medicare Other | Admitting: Lab

## 2013-10-04 VITALS — BP 118/72 | HR 80 | Temp 97.1°F | Resp 20 | Ht 71.0 in | Wt 233.1 lb

## 2013-10-04 DIAGNOSIS — N189 Chronic kidney disease, unspecified: Secondary | ICD-10-CM

## 2013-10-04 DIAGNOSIS — E1142 Type 2 diabetes mellitus with diabetic polyneuropathy: Secondary | ICD-10-CM

## 2013-10-04 DIAGNOSIS — IMO0002 Reserved for concepts with insufficient information to code with codable children: Secondary | ICD-10-CM

## 2013-10-04 DIAGNOSIS — E1149 Type 2 diabetes mellitus with other diabetic neurological complication: Secondary | ICD-10-CM

## 2013-10-04 DIAGNOSIS — N342 Other urethritis: Secondary | ICD-10-CM

## 2013-10-04 DIAGNOSIS — G620 Drug-induced polyneuropathy: Secondary | ICD-10-CM

## 2013-10-04 DIAGNOSIS — C9002 Multiple myeloma in relapse: Secondary | ICD-10-CM

## 2013-10-04 DIAGNOSIS — E1140 Type 2 diabetes mellitus with diabetic neuropathy, unspecified: Secondary | ICD-10-CM

## 2013-10-04 DIAGNOSIS — I82401 Acute embolism and thrombosis of unspecified deep veins of right lower extremity: Secondary | ICD-10-CM

## 2013-10-04 HISTORY — DX: Other urethritis: N34.2

## 2013-10-04 LAB — CBC WITH DIFFERENTIAL/PLATELET
BASO%: 0.6 % (ref 0.0–2.0)
Basophils Absolute: 0 10*3/uL (ref 0.0–0.1)
Eosinophils Absolute: 0 10*3/uL (ref 0.0–0.5)
HGB: 12.4 g/dL — ABNORMAL LOW (ref 13.0–17.1)
MCV: 96.3 fL (ref 79.3–98.0)
MONO%: 11 % (ref 0.0–14.0)
NEUT#: 2.4 10*3/uL (ref 1.5–6.5)
RBC: 3.81 10*6/uL — ABNORMAL LOW (ref 4.20–5.82)
RDW: 15.1 % — ABNORMAL HIGH (ref 11.0–14.6)
lymph#: 0.9 10*3/uL (ref 0.9–3.3)

## 2013-10-04 LAB — COMPREHENSIVE METABOLIC PANEL (CC13)
Albumin: 3 g/dL — ABNORMAL LOW (ref 3.5–5.0)
Anion Gap: 6 mEq/L (ref 3–11)
BUN: 11.6 mg/dL (ref 7.0–26.0)
CO2: 31 mEq/L — ABNORMAL HIGH (ref 22–29)
Calcium: 8.4 mg/dL (ref 8.4–10.4)
Chloride: 102 mEq/L (ref 98–109)
Glucose: 231 mg/dl — ABNORMAL HIGH (ref 70–140)
Potassium: 3.8 mEq/L (ref 3.5–5.1)

## 2013-10-04 LAB — URINALYSIS, MICROSCOPIC - CHCC
Bilirubin (Urine): NEGATIVE
Ketones: NEGATIVE mg/dL
Leukocyte Esterase: NEGATIVE
pH: 6.5 (ref 4.6–8.0)

## 2013-10-04 LAB — LACTATE DEHYDROGENASE (CC13): LDH: 208 U/L (ref 125–245)

## 2013-10-04 NOTE — Telephone Encounter (Signed)
Pt sent back to lb and given appt schedule for October thru December. Due to holiday 11/27 and 11/28 tx scheduled for 11/25 and 11/26 - ok per inf manager.

## 2013-10-04 NOTE — Progress Notes (Signed)
Hematology and Oncology Follow Up Visit  [name] [name] [id number] 1941/03/26 72 y.o. 10/04/2013 4:28 PM   Principle Diagnosis: Encounter Diagnoses  Name Primary?  Marland Kitchen DVT of lower extremity (deep venous thrombosis), right   . Peripheral neuropathy, secondary to drugs or chemicals   . Diabetic neuropathy, type II diabetes mellitus   . Multiple myeloma in relapse Yes  . Urethritis, unspecified      Interim History:    Followup visit for this 72 year old man with lambda light chain multiple myeloma. Initial diagnosis June 2010. He had a heavy myeloma burden at time of diagnosis with multiple lytic bone lesions, 87% plasma cells in the bone marrow, and 6.7 g of protein in the urine. 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 He went on to receive high-dose IV melphalan with autologous stem cell support at [facility] 03/12/2010. We began to see small amounts of monoclonal lambda free light chains again in his urine in March of 2012. He was started on Revlimid 10 mg daily beginning in April 2012. Dose had to be adjusted due to myelosuppression down to 5 mg. At time of a visit here in December 2013, he was complaining of recurrent pain in his shoulders, bilateral ribs, and low back. Lab showed rise in his serum and urine paraprotein levels. He was restaged with a bone marrow biopsy on 12/31/2012 which showed 10% plasma cells, rise in total urine protein from 260 mg to 1039 mg, and rise in serum free lambda light chains from 4.8 mg percent in July 2013,to 10 mg percent on October 29, to a 25.8 mg percent by December 16, to 58.5 mg percent by 01/07/2013.  I started him on pomalidomide on 01/28/2013. Initial dose 3 mg 21 days on 7 days rest.  He  tolerated the drug well. I was cautiously optimistic that we were starting to see a response.   Unfortunately, at time of his visit here on August 12 he  appeared to be  breaking through the pomalidomide. Although his hematologic profile remained stable, there was been a progressive rise in the lambda free light chains up to 116 mg percent as of 07/26/2013 compared with 58 on May 28. 24 hour urine total protein  back up to 6645 mg. After a lengthy discussion with respect to treatment options, I recommended a trial of Bendamustine  plus prednisone. He had his first cycle starting on September 4 and fifth. He tolerated the drug well with no acute toxicities. I started at 70 mg per meter squared day 1 and day 2. IV with the prednisone 80 mg x4 days not including the Decadron premed given with the bendamustine. It is too early to assess response. Free light chains obtained after just one cycle still rising at 167 mg percent compared with 116 on August 5.  He has had no interim problems. He has had decrease in abdominal and extremity swelling on the prednisone compared with the Decadron. No change in his chronic neuropathy affecting mostly his feet. No change in chronic atypical right flank pain. Only new complaint is some blood stains on his undergarments after urination and increased dysuria without increased frequency occurring over the last week. Hard to get an accurate history but it appears that he has had these symptoms before and they are now recurrent.   Medications: reviewed  Allergies: No Known Allergies  Review of Systems: Constitutional:   Appetite good. No  weight loss. No fevers HEENT no sore throat. Respiratory: No cough or dyspnea Cardiovascular:  No chest pain or palpitations Gastrointestinal: No abdominal pain or change in bowel habit Genito-Urinary: See above Musculoskeletal: No new areas of bone pain Neurologic: No headache or change in vision Skin: No rash or ecchymosis  Remaining ROS negative.    Physical Exam: Blood pressure 118/72, pulse 80, temperature 97.1 F (36.2 C), temperature source Oral, resp. rate 20, height 5\' 11"  (1.803 m),  weight 233 lb 1.6 oz (105.733 kg). Wt Readings from Last 3 Encounters:  10/04/13 233 lb 1.6 oz (105.733 kg)  09/08/13 229 lb 6.4 oz (104.055 kg)  08/16/13 232 lb 3.2 oz (105.325 kg)     General appearance: Well-nourished African American man HENNT: Pharynx no erythema, exudate, or ulcer. No thyromegaly Lymph nodes: No cervical, supraclavicular, or axillary adenopathy Breasts: Lungs: Clear to auscultation resonant to percussion Heart: Regular rhythm no murmur Abdomen: Soft, nontender, no mass, no organomegaly Extremities: Trace-1+ edema improved Musculoskeletal: No joint deformities GU: Vascular: No carotid bruits, no cyanosis Neurologic: Mental status intact, cranial nerves grossly normal, motor strength 5 over 5, reflexes absent symmetric at the knees, 1+ symmetric at the biceps, sensation mildly decreased to vibration over the fingertips Skin: No rash or ecchymosis  Lab Results: CBC W/Diff    Component Value Date/Time   WBC 3.7* 10/04/2013 1008   WBC 5.1 12/30/2012 0731   WBC 5.6 06/13/2009 1503   RBC 3.81* 10/04/2013 1008   RBC 4.63 12/30/2012 0731   HGB 12.4* 10/04/2013 1008   HGB 14.9 12/30/2012 0731   HGB 9.0* 06/13/2009 1503   HCT 36.6* 10/04/2013 1008   HCT 42.9 12/30/2012 0731   HCT 25.6* 06/13/2009 1503   PLT 73* 10/04/2013 1008   PLT 101* 12/30/2012 0731   PLT 189 06/13/2009 1503   MCV 96.3 10/04/2013 1008   MCV 92.7 12/30/2012 0731   MCV 90 06/13/2009 1503   MCH 32.6 10/04/2013 1008   MCH 32.2 12/30/2012 0731   MCH 31.4 06/13/2009 1503   MCHC 33.9 10/04/2013 1008   MCHC 34.7 12/30/2012 0731   MCHC 35.0 06/13/2009 1503   RDW 15.1* 10/04/2013 1008   RDW 14.8 12/30/2012 0731   RDW 15.2* 06/13/2009 1503   LYMPHSABS 0.9 10/04/2013 1008   LYMPHSABS 0.9 12/30/2012 0731   LYMPHSABS 1.5 06/13/2009 1503   MONOABS 0.4 10/04/2013 1008   MONOABS 0.3 12/30/2012 0731   EOSABS 0.0 10/04/2013 1008   EOSABS 0.1 12/30/2012 0731   EOSABS 0.1 06/13/2009 1503   BASOSABS 0.0 10/04/2013 1008   BASOSABS  0.0 12/30/2012 0731   BASOSABS 0.0 06/13/2009 1503     Chemistry      Component Value Date/Time   NA 140 10/04/2013 1008   NA 139 07/20/2012 1123   NA 145 06/13/2009 1503   K 3.8 10/04/2013 1008   K 4.1 07/20/2012 1123   K 4.8* 06/13/2009 1503   CL 104 05/18/2013 1113   CL 106 07/20/2012 1123   CL 108 06/13/2009 1503   CO2 31* 10/04/2013 1008   CO2 26 07/20/2012 1123   CO2 29 06/13/2009 1503   BUN 11.6 10/04/2013 1008   BUN 12 07/20/2012 1123   BUN 49* 06/13/2009 1503   CREATININE 1.5* 10/04/2013 1008   CREATININE 1.33 07/22/2012 1334   CREATININE 1.47* 07/20/2012 1123   CREATININE 2.9* 06/13/2009 1503      Component Value Date/Time   CALCIUM 8.4 10/04/2013 1008   CALCIUM 8.8 07/20/2012 1123  CALCIUM 10.5* 06/13/2009 1503   ALKPHOS 126 10/04/2013 1008   ALKPHOS 137* 07/20/2012 1123   ALKPHOS 135* 06/13/2009 1503   AST 14 10/04/2013 1008   AST 13 07/20/2012 1123   AST 21 06/13/2009 1503   ALT <6 10/04/2013 1008   ALT <8 07/20/2012 1123   ALT 25 06/13/2009 1503   BILITOT 1.01 10/04/2013 1008   BILITOT 0.8 07/20/2012 1123   BILITOT 0.60 06/13/2009 1503       Impression: #1. Relapsed lambda light chain multiple myeloma He is tolerating recently started  Treanda/prednisone. Cycle 2 just started on October 2 and third. Too early to assess response. We'll continue to monitor his serum light chains and urine total protein. Repeat 24-hour urine for total protein and IFE and following 3 cycles of treatment.  #2. 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  #3. Type 2 diabetes. Exacerbated by steroids. He continues metformin. him off the steroids.   .  #3. Right lower extremity deep vein thrombosis 05/14/2011. He continues full-dose Coumadin anticoagulation.   #4.Essential hypertension. Controlled on current medication.   #5. Parkinson's disease. Decreased tremor on current regimen.   #6. Hyperlipidemia.   #7.Degenerative arthritis.   #8.Chronic renal  insufficiency. Creatinine level currently stable.   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  #10. Urethritis. I will check urine culture and analysis today. Antibiotics if indicated.      CC:.    Levert Feinstein, MD 10/14/20144:28 PM

## 2013-10-05 LAB — URINE CULTURE

## 2013-10-06 ENCOUNTER — Telehealth: Payer: Self-pay | Admitting: *Deleted

## 2013-10-06 NOTE — Telephone Encounter (Signed)
Message copied by Gala Romney on Thu Oct 06, 2013 10:26 AM ------      Message from: Levert Feinstein      Created: Tue Oct 04, 2013  8:36 PM       Call pt: no obvious infection in urine ------

## 2013-10-06 NOTE — Telephone Encounter (Signed)
Left vm no obvious infection in urine; call office if questions.

## 2013-10-20 ENCOUNTER — Other Ambulatory Visit (HOSPITAL_BASED_OUTPATIENT_CLINIC_OR_DEPARTMENT_OTHER): Payer: Medicare Other | Admitting: Lab

## 2013-10-20 ENCOUNTER — Ambulatory Visit (HOSPITAL_BASED_OUTPATIENT_CLINIC_OR_DEPARTMENT_OTHER): Payer: Medicare Other

## 2013-10-20 VITALS — BP 112/73 | HR 87 | Temp 97.0°F | Resp 20

## 2013-10-20 DIAGNOSIS — C9002 Multiple myeloma in relapse: Secondary | ICD-10-CM

## 2013-10-20 DIAGNOSIS — Z5111 Encounter for antineoplastic chemotherapy: Secondary | ICD-10-CM

## 2013-10-20 LAB — BASIC METABOLIC PANEL (CC13)
BUN: 15.7 mg/dL (ref 7.0–26.0)
CO2: 23 mEq/L (ref 22–29)
Calcium: 8.8 mg/dL (ref 8.4–10.4)
Creatinine: 1.4 mg/dL — ABNORMAL HIGH (ref 0.7–1.3)

## 2013-10-20 LAB — CBC WITH DIFFERENTIAL/PLATELET
BASO%: 1.2 % (ref 0.0–2.0)
HCT: 39.2 % (ref 38.4–49.9)
MCH: 32.1 pg (ref 27.2–33.4)
MCHC: 33.5 g/dL (ref 32.0–36.0)
MONO#: 0.4 10*3/uL (ref 0.1–0.9)
NEUT%: 68 % (ref 39.0–75.0)
Platelets: 104 10*3/uL — ABNORMAL LOW (ref 140–400)
RBC: 4.1 10*6/uL — ABNORMAL LOW (ref 4.20–5.82)
WBC: 3.9 10*3/uL — ABNORMAL LOW (ref 4.0–10.3)
lymph#: 0.8 10*3/uL — ABNORMAL LOW (ref 0.9–3.3)

## 2013-10-20 MED ORDER — ONDANSETRON 8 MG/NS 50 ML IVPB
INTRAVENOUS | Status: AC
  Filled 2013-10-20: qty 8

## 2013-10-20 MED ORDER — ONDANSETRON 8 MG/50ML IVPB (CHCC)
8.0000 mg | Freq: Once | INTRAVENOUS | Status: AC
  Administered 2013-10-20: 8 mg via INTRAVENOUS

## 2013-10-20 MED ORDER — DEXAMETHASONE SODIUM PHOSPHATE 10 MG/ML IJ SOLN
INTRAMUSCULAR | Status: AC
  Filled 2013-10-20: qty 1

## 2013-10-20 MED ORDER — SODIUM CHLORIDE 0.9 % IV SOLN
Freq: Once | INTRAVENOUS | Status: AC
  Administered 2013-10-20: 10:00:00 via INTRAVENOUS

## 2013-10-20 MED ORDER — BENDAMUSTINE HCL (LYOPHILIZED PWD) CHEMO INJECTION 100MG
70.0000 mg/m2 | Freq: Once | INTRAVENOUS | Status: AC
  Administered 2013-10-20: 160 mg via INTRAVENOUS
  Filled 2013-10-20: qty 32

## 2013-10-20 MED ORDER — DEXAMETHASONE SODIUM PHOSPHATE 10 MG/ML IJ SOLN
10.0000 mg | Freq: Once | INTRAMUSCULAR | Status: AC
  Administered 2013-10-20: 10 mg via INTRAVENOUS

## 2013-10-20 NOTE — Patient Instructions (Signed)
[facility] Discharge Instructions for Patients Receiving Chemotherapy  Today you received the following chemotherapy agent: Treanda   To help prevent nausea and vomiting after your treatment, we encourage you to take your nausea medication as prescribed.    If you develop nausea and vomiting that is not controlled by your nausea medication, call the clinic.   BELOW ARE SYMPTOMS THAT SHOULD BE REPORTED IMMEDIATELY:  *FEVER GREATER THAN 100.5 F  *CHILLS WITH OR WITHOUT FEVER  NAUSEA AND VOMITING THAT IS NOT CONTROLLED WITH YOUR NAUSEA MEDICATION  *UNUSUAL SHORTNESS OF BREATH  *UNUSUAL BRUISING OR BLEEDING  TENDERNESS IN MOUTH AND THROAT WITH OR WITHOUT PRESENCE OF ULCERS  *URINARY PROBLEMS  *BOWEL PROBLEMS  UNUSUAL RASH Items with * indicate a potential emergency and should be followed up as soon as possible.  Feel free to call the clinic you have any questions or concerns. The clinic phone number is [phone].    

## 2013-10-21 ENCOUNTER — Ambulatory Visit (HOSPITAL_BASED_OUTPATIENT_CLINIC_OR_DEPARTMENT_OTHER): Payer: Medicare Other

## 2013-10-21 ENCOUNTER — Encounter (INDEPENDENT_AMBULATORY_CARE_PROVIDER_SITE_OTHER): Payer: Self-pay

## 2013-10-21 VITALS — BP 130/72 | HR 87 | Temp 97.6°F

## 2013-10-21 DIAGNOSIS — C9002 Multiple myeloma in relapse: Secondary | ICD-10-CM

## 2013-10-21 DIAGNOSIS — Z5111 Encounter for antineoplastic chemotherapy: Secondary | ICD-10-CM

## 2013-10-21 LAB — KAPPA/LAMBDA LIGHT CHAINS
Kappa:Lambda Ratio: 0 — ABNORMAL LOW (ref 0.26–1.65)
Lambda Free Lght Chn: 143 mg/dL — ABNORMAL HIGH (ref 0.57–2.63)

## 2013-10-21 LAB — IGG: IgG (Immunoglobin G), Serum: 377 mg/dL — ABNORMAL LOW (ref 650–1600)

## 2013-10-21 MED ORDER — DEXAMETHASONE SODIUM PHOSPHATE 10 MG/ML IJ SOLN
10.0000 mg | Freq: Once | INTRAMUSCULAR | Status: AC
  Administered 2013-10-21: 10 mg via INTRAVENOUS

## 2013-10-21 MED ORDER — ONDANSETRON 8 MG/NS 50 ML IVPB
INTRAVENOUS | Status: AC
  Filled 2013-10-21: qty 8

## 2013-10-21 MED ORDER — SODIUM CHLORIDE 0.9 % IV SOLN
70.0000 mg/m2 | Freq: Once | INTRAVENOUS | Status: AC
  Administered 2013-10-21: 160 mg via INTRAVENOUS
  Filled 2013-10-21: qty 32

## 2013-10-21 MED ORDER — ONDANSETRON 8 MG/50ML IVPB (CHCC)
8.0000 mg | Freq: Once | INTRAVENOUS | Status: AC
  Administered 2013-10-21: 8 mg via INTRAVENOUS

## 2013-10-21 MED ORDER — DEXAMETHASONE SODIUM PHOSPHATE 10 MG/ML IJ SOLN
INTRAMUSCULAR | Status: AC
  Filled 2013-10-21: qty 1

## 2013-10-21 MED ORDER — SODIUM CHLORIDE 0.9 % IV SOLN
Freq: Once | INTRAVENOUS | Status: AC
  Administered 2013-10-21: 11:00:00 via INTRAVENOUS

## 2013-10-21 NOTE — Patient Instructions (Signed)
[facility] Discharge Instructions for Patients Receiving Chemotherapy  Today you received the following chemotherapy agents: Treanda.  To help prevent nausea and vomiting after your treatment, we encourage you to take your nausea medication as prescribed.   If you develop nausea and vomiting that is not controlled by your nausea medication, call the clinic.   BELOW ARE SYMPTOMS THAT SHOULD BE REPORTED IMMEDIATELY:  *FEVER GREATER THAN 100.5 F  *CHILLS WITH OR WITHOUT FEVER  NAUSEA AND VOMITING THAT IS NOT CONTROLLED WITH YOUR NAUSEA MEDICATION  *UNUSUAL SHORTNESS OF BREATH  *UNUSUAL BRUISING OR BLEEDING  TENDERNESS IN MOUTH AND THROAT WITH OR WITHOUT PRESENCE OF ULCERS  *URINARY PROBLEMS  *BOWEL PROBLEMS  UNUSUAL RASH Items with * indicate a potential emergency and should be followed up as soon as possible.  Feel free to call the clinic you have any questions or concerns. The clinic phone number is [phone].    

## 2013-11-01 ENCOUNTER — Other Ambulatory Visit: Payer: Medicare Other | Admitting: Lab

## 2013-11-01 ENCOUNTER — Ambulatory Visit: Payer: Medicare Other | Admitting: Nurse Practitioner

## 2013-11-03 ENCOUNTER — Other Ambulatory Visit: Payer: Self-pay | Admitting: Nurse Practitioner

## 2013-11-03 ENCOUNTER — Other Ambulatory Visit (HOSPITAL_BASED_OUTPATIENT_CLINIC_OR_DEPARTMENT_OTHER): Payer: Medicare Other | Admitting: Lab

## 2013-11-03 ENCOUNTER — Encounter (INDEPENDENT_AMBULATORY_CARE_PROVIDER_SITE_OTHER): Payer: Self-pay

## 2013-11-03 ENCOUNTER — Ambulatory Visit (HOSPITAL_BASED_OUTPATIENT_CLINIC_OR_DEPARTMENT_OTHER): Payer: Medicare Other | Admitting: Nurse Practitioner

## 2013-11-03 ENCOUNTER — Telehealth: Payer: Self-pay | Admitting: Oncology

## 2013-11-03 VITALS — BP 109/72 | HR 92 | Temp 97.2°F | Resp 18 | Ht 71.0 in | Wt 227.9 lb

## 2013-11-03 DIAGNOSIS — E119 Type 2 diabetes mellitus without complications: Secondary | ICD-10-CM

## 2013-11-03 DIAGNOSIS — E1142 Type 2 diabetes mellitus with diabetic polyneuropathy: Secondary | ICD-10-CM

## 2013-11-03 DIAGNOSIS — R5381 Other malaise: Secondary | ICD-10-CM

## 2013-11-03 DIAGNOSIS — E1149 Type 2 diabetes mellitus with other diabetic neurological complication: Secondary | ICD-10-CM

## 2013-11-03 DIAGNOSIS — Z86718 Personal history of other venous thrombosis and embolism: Secondary | ICD-10-CM

## 2013-11-03 DIAGNOSIS — C9002 Multiple myeloma in relapse: Secondary | ICD-10-CM

## 2013-11-03 DIAGNOSIS — N189 Chronic kidney disease, unspecified: Secondary | ICD-10-CM

## 2013-11-03 DIAGNOSIS — I129 Hypertensive chronic kidney disease with stage 1 through stage 4 chronic kidney disease, or unspecified chronic kidney disease: Secondary | ICD-10-CM

## 2013-11-03 LAB — CBC WITH DIFFERENTIAL/PLATELET
Basophils Absolute: 0 10*3/uL (ref 0.0–0.1)
Eosinophils Absolute: 0 10*3/uL (ref 0.0–0.5)
HCT: 38.9 % (ref 38.4–49.9)
HGB: 13.1 g/dL (ref 13.0–17.1)
MONO#: 0.5 10*3/uL (ref 0.1–0.9)
NEUT#: 3.7 10*3/uL (ref 1.5–6.5)
NEUT%: 84.6 % — ABNORMAL HIGH (ref 39.0–75.0)
RBC: 4.03 10*6/uL — ABNORMAL LOW (ref 4.20–5.82)
RDW: 14.2 % (ref 11.0–14.6)
WBC: 4.4 10*3/uL (ref 4.0–10.3)
lymph#: 0.2 10*3/uL — ABNORMAL LOW (ref 0.9–3.3)

## 2013-11-03 LAB — COMPREHENSIVE METABOLIC PANEL (CC13)
ALT: 16 U/L (ref 0–55)
AST: 15 U/L (ref 5–34)
Anion Gap: 9 mEq/L (ref 3–11)
BUN: 10.9 mg/dL (ref 7.0–26.0)
CO2: 29 mEq/L (ref 22–29)
Calcium: 8.9 mg/dL (ref 8.4–10.4)
Chloride: 103 mEq/L (ref 98–109)
Creatinine: 1.3 mg/dL (ref 0.7–1.3)
Total Bilirubin: 1.12 mg/dL (ref 0.20–1.20)

## 2013-11-03 LAB — LACTATE DEHYDROGENASE (CC13): LDH: 240 U/L (ref 125–245)

## 2013-11-03 NOTE — Telephone Encounter (Signed)
worked 11/13 POF printed AVS and cal for pt shh

## 2013-11-03 NOTE — Progress Notes (Signed)
OFFICE PROGRESS NOTE  Interval history:   [name] is a 72 year old man with relapsed lambda light chain multiple myeloma. He began treatment with bendamustine 70 mg per meter squared day 1 and day 2 of a 28 day cycle and prednisone 80 mg daily for 4 days on 08/25/2013. He completed cycle 2 beginning 09/22/2013 and cycle 3 beginning 10/20/2013. He is seen today for scheduled follow-up.   He feels he is overall tolerating the chemotherapy well. He denies nausea/vomiting. No mouth sores. He has intermittent constipation relieved with a laxative. Stable neuropathy symptoms in the hands and feet. He reports stable pain with no new areas of pain. He continues to have a good appetite. No fever, cough or shortness of breath. His only complaint today is feeling "weak".  His wife notes that his blood pressure increases to a systolic of 160 for several days following each chemotherapy session.   Objective: Blood pressure 109/72, pulse 92, temperature 97.2 F (36.2 C), temperature source Oral, resp. rate 18, height 5\' 11"  (1.803 m), weight 227 lb 14.4 oz (103.375 kg).  Oropharynx is without thrush or ulceration. No palpable cervical or supraclavicular lymph nodes. Lungs are clear. No wheezes or rales. Regular cardiac rhythm. No murmur. Abdomen soft and nontender. No organomegaly. Trace lower leg edema bilaterally. Calves nontender. Motor strength 5 over 5. Knee DTRs absent symmetrically. Vibratory sense intact over the fingertips per tuning fork exam. No skin rash.  Lab Results: Lab Results  Component Value Date   WBC 4.4 11/03/2013   HGB 13.1 11/03/2013   HCT 38.9 11/03/2013   MCV 96.6 11/03/2013   PLT 86* 11/03/2013    Chemistry:    Chemistry      Component Value Date/Time   NA 141 11/03/2013 0958   NA 139 07/20/2012 1123   NA 145 06/13/2009 1503   K 3.7 11/03/2013 0958   K 4.1 07/20/2012 1123   K 4.8* 06/13/2009 1503   CL 104 05/18/2013 1113   CL 106 07/20/2012 1123   CL 108 06/13/2009 1503    CO2 29 11/03/2013 0958   CO2 26 07/20/2012 1123   CO2 29 06/13/2009 1503   BUN 10.9 11/03/2013 0958   BUN 12 07/20/2012 1123   BUN 49* 06/13/2009 1503   CREATININE 1.3 11/03/2013 0958   CREATININE 1.33 07/22/2012 1334   CREATININE 1.47* 07/20/2012 1123   CREATININE 2.9* 06/13/2009 1503      Component Value Date/Time   CALCIUM 8.9 11/03/2013 0958   CALCIUM 8.8 07/20/2012 1123   CALCIUM 10.5* 06/13/2009 1503   ALKPHOS 151* 11/03/2013 0958   ALKPHOS 137* 07/20/2012 1123   ALKPHOS 135* 06/13/2009 1503   AST 15 11/03/2013 0958   AST 13 07/20/2012 1123   AST 21 06/13/2009 1503   ALT 16 11/03/2013 0958   ALT <8 07/20/2012 1123   ALT 25 06/13/2009 1503   BILITOT 1.12 11/03/2013 0958   BILITOT 0.8 07/20/2012 1123   BILITOT 0.60 06/13/2009 1503     10/20/2013 serum lambda free light chains 143 (167 on 09/22/2013 and 116 on 07/26/2013). IgG 377 on 10/20/2013, 331 on 09/22/2013 and 375 on 07/26/2013.  Studies/Results: No results found.  Medications: I have reviewed the patient's current medications.  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 He was found to have multiple lytic bone lesions. Serum immunoglobulins were all suppressed. He had monoclonal lambda light chains on immunofixation electrophoresis. There was marked elevation of serum free  lambda light chains at 3.5 g. Initial urine showed 6.7 g of protein, primarily free lambda light chains. Bone marrow biopsy showed 87% plasma cells. He was induced into a remission with a combination of Velcade plus dexamethasone with a subsequent addition of Revlimid. Velcade was discontinued due to progressive neuropathy. He went on to receive IV melphalan with autologous bone marrow support at [facility] on 03/12/2010. He remained off of treatment until a small amount of monoclonal lambda free light chain was again found in the urine in March 2012. He was started on Revlimid 10 mg daily beginning  04/09/2011. The Revlimid schedule was changed to 21 days on followed by a 7 day break at the same dose of 10 mg in August 2012 due to myelosuppression. Due to myelosuppression the Revlimid dose was decreased to 5 mg daily 3 weeks on/1 week off following an office visit on 03/26/2012. Serum lambda free light chains returned at 9.98 on 10/19/2012 as compared to 4.85 on 07/20/2012. There was further increase of the lambda free light chains on 11/30/2012 to 25.8. 24-hour urine total protein returned at 1039 mg which was up from baseline of 260 mg. Bone survey 10/19/2012 showed multiple lesions but no new or unstable lesions. Bone marrow aspiration/biopsy done 12/30/2012 showed 10% plasma cells. Pomalidomide cycle 1 initiated on 03/09/2013. He began cycle 2 on 04/06/2013. Serum lambda light chains improved on 04/19/2013. Progressive rise in the lambda free light chains as of 07/26/2013 and rise in 24-hour urine total protein. Treatment initiated with bendamustine/prednisone 08/25/2013. 2. Type 2 diabetes. Exacerbated by steroids. He continues metformin. He is also utilizing insulin. He is followed by Dr. Nehemiah Settle. 3. Right lower extremity deep vein thrombosis 05/14/2011. Coumadin anticoagulation discontinued 09/08/2013. 4. 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 He continues Lyrica. 5. Essential hypertension.  6. Parkinson's disease.  7. Recent bronchitis. 8. Hyperlipidemia. 9. Degenerative arthritis. 10. Chronic renal insufficiency.  11. 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 12. Hypotensive when in the office on 09/08/2013. Diovan was discontinued.    Disposition-[name] appears stable. He has completed 3 cycles of 3 Bendumustine/dexamethasone. We will followup on the serum light chain analysis from today. He will complete a 24-hour urine and submit for analysis next week. He is scheduled to return for  cycle 4 bendamustine/Rituxan 11/15/2013. He has a followup visit with Dr. Cyndie Chime on 12/02/2013. He will contact the office prior to that visit with any problems.   Lonna Cobb ANP/GNP-BC

## 2013-11-04 LAB — KAPPA/LAMBDA LIGHT CHAINS
Kappa:Lambda Ratio: 0 — ABNORMAL LOW (ref 0.26–1.65)
Lambda Free Lght Chn: 91.3 mg/dL — ABNORMAL HIGH (ref 0.57–2.63)

## 2013-11-08 ENCOUNTER — Ambulatory Visit: Payer: Medicare Other | Admitting: Lab

## 2013-11-08 DIAGNOSIS — C9002 Multiple myeloma in relapse: Secondary | ICD-10-CM

## 2013-11-10 LAB — UIFE/LIGHT CHAINS/TP QN, 24-HR UR
Albumin, U: DETECTED
Alpha 1, Urine: DETECTED — AB
Free Kappa Lt Chains,Ur: 3.95 mg/dL — ABNORMAL HIGH (ref 0.14–2.42)
Free Kappa/Lambda Ratio: 0.01 ratio — ABNORMAL LOW (ref 2.04–10.37)
Free Lambda Excretion/Day: 5301 mg/d
Free Lt Chn Excr Rate: 75.05 mg/d
Gamma Globulin, Urine: DETECTED — AB
Time: 24 hours
Total Protein, Urine: 285.8 mg/dL
Volume, Urine: 1900 mL

## 2013-11-14 ENCOUNTER — Other Ambulatory Visit: Payer: Self-pay | Admitting: Oncology

## 2013-11-15 ENCOUNTER — Other Ambulatory Visit (HOSPITAL_BASED_OUTPATIENT_CLINIC_OR_DEPARTMENT_OTHER): Payer: Medicare Other | Admitting: Lab

## 2013-11-15 ENCOUNTER — Ambulatory Visit (HOSPITAL_BASED_OUTPATIENT_CLINIC_OR_DEPARTMENT_OTHER): Payer: Medicare Other

## 2013-11-15 VITALS — BP 109/73 | HR 91 | Temp 97.1°F | Resp 16

## 2013-11-15 DIAGNOSIS — C9002 Multiple myeloma in relapse: Secondary | ICD-10-CM

## 2013-11-15 DIAGNOSIS — Z5111 Encounter for antineoplastic chemotherapy: Secondary | ICD-10-CM

## 2013-11-15 LAB — CBC WITH DIFFERENTIAL/PLATELET
BASO%: 0.3 % (ref 0.0–2.0)
Eosinophils Absolute: 0.1 10*3/uL (ref 0.0–0.5)
HCT: 38.4 % (ref 38.4–49.9)
LYMPH%: 10.2 % — ABNORMAL LOW (ref 14.0–49.0)
MCHC: 34.4 g/dL (ref 32.0–36.0)
MCV: 94.1 fL (ref 79.3–98.0)
MONO%: 9.2 % (ref 0.0–14.0)
NEUT%: 77.9 % — ABNORMAL HIGH (ref 39.0–75.0)
Platelets: 106 10*3/uL — ABNORMAL LOW (ref 140–400)
RBC: 4.08 10*6/uL — ABNORMAL LOW (ref 4.20–5.82)
nRBC: 0 % (ref 0–0)

## 2013-11-15 MED ORDER — DEXAMETHASONE SODIUM PHOSPHATE 10 MG/ML IJ SOLN
INTRAMUSCULAR | Status: AC
  Filled 2013-11-15: qty 1

## 2013-11-15 MED ORDER — ONDANSETRON 8 MG/50ML IVPB (CHCC)
8.0000 mg | Freq: Once | INTRAVENOUS | Status: AC
  Administered 2013-11-15: 8 mg via INTRAVENOUS

## 2013-11-15 MED ORDER — ONDANSETRON 8 MG/NS 50 ML IVPB
INTRAVENOUS | Status: AC
  Filled 2013-11-15: qty 8

## 2013-11-15 MED ORDER — DEXAMETHASONE SODIUM PHOSPHATE 10 MG/ML IJ SOLN
10.0000 mg | Freq: Once | INTRAMUSCULAR | Status: AC
  Administered 2013-11-15: 10 mg via INTRAVENOUS

## 2013-11-15 MED ORDER — SODIUM CHLORIDE 0.9 % IV SOLN
70.0000 mg/m2 | Freq: Once | INTRAVENOUS | Status: AC
  Administered 2013-11-15: 160 mg via INTRAVENOUS
  Filled 2013-11-15: qty 32

## 2013-11-15 MED ORDER — SODIUM CHLORIDE 0.9 % IV SOLN
Freq: Once | INTRAVENOUS | Status: AC
  Administered 2013-11-15: 14:00:00 via INTRAVENOUS

## 2013-11-15 NOTE — Patient Instructions (Signed)
[facility] Discharge Instructions for Patients Receiving Chemotherapy  Today you received the following chemotherapy agents Treanda To help prevent nausea and vomiting after your treatment, we encourage you to take your nausea medication Zofran, 1 tablet every 8 hours as needed.   If you develop nausea and vomiting that is not controlled by your nausea medication, call the clinic.   BELOW ARE SYMPTOMS THAT SHOULD BE REPORTED IMMEDIATELY:  *FEVER GREATER THAN 100.5 F  *CHILLS WITH OR WITHOUT FEVER  NAUSEA AND VOMITING THAT IS NOT CONTROLLED WITH YOUR NAUSEA MEDICATION  *UNUSUAL SHORTNESS OF BREATH  *UNUSUAL BRUISING OR BLEEDING  TENDERNESS IN MOUTH AND THROAT WITH OR WITHOUT PRESENCE OF ULCERS  *URINARY PROBLEMS  *BOWEL PROBLEMS  UNUSUAL RASH Items with * indicate a potential emergency and should be followed up as soon as possible.  Feel free to call the clinic should you have any questions or concerns. The clinic phone number is [phone].

## 2013-11-16 ENCOUNTER — Ambulatory Visit (HOSPITAL_BASED_OUTPATIENT_CLINIC_OR_DEPARTMENT_OTHER): Payer: Medicare Other

## 2013-11-16 ENCOUNTER — Telehealth: Payer: Self-pay | Admitting: *Deleted

## 2013-11-16 ENCOUNTER — Other Ambulatory Visit: Payer: Self-pay | Admitting: Oncology

## 2013-11-16 DIAGNOSIS — C9002 Multiple myeloma in relapse: Secondary | ICD-10-CM

## 2013-11-16 DIAGNOSIS — Z5112 Encounter for antineoplastic immunotherapy: Secondary | ICD-10-CM

## 2013-11-16 MED ORDER — ONDANSETRON 8 MG/50ML IVPB (CHCC)
8.0000 mg | Freq: Once | INTRAVENOUS | Status: AC
  Administered 2013-11-16: 8 mg via INTRAVENOUS

## 2013-11-16 MED ORDER — DEXAMETHASONE SODIUM PHOSPHATE 10 MG/ML IJ SOLN
10.0000 mg | Freq: Once | INTRAMUSCULAR | Status: AC
  Administered 2013-11-16: 10 mg via INTRAVENOUS

## 2013-11-16 MED ORDER — SODIUM CHLORIDE 0.9 % IV SOLN
70.0000 mg/m2 | Freq: Once | INTRAVENOUS | Status: AC
  Administered 2013-11-16: 160 mg via INTRAVENOUS
  Filled 2013-11-16: qty 32

## 2013-11-16 MED ORDER — SODIUM CHLORIDE 0.9 % IV SOLN
Freq: Once | INTRAVENOUS | Status: AC
  Administered 2013-11-16: 15:00:00 via INTRAVENOUS

## 2013-11-16 MED ORDER — DEXAMETHASONE SODIUM PHOSPHATE 10 MG/ML IJ SOLN
INTRAMUSCULAR | Status: AC
  Filled 2013-11-16: qty 1

## 2013-11-16 MED ORDER — ONDANSETRON 8 MG/NS 50 ML IVPB
INTRAVENOUS | Status: AC
  Filled 2013-11-16: qty 8

## 2013-11-16 NOTE — Patient Instructions (Signed)
[facility] Discharge Instructions for Patients Receiving Chemotherapy  Today you received the following chemotherapy agents: Treanda.  To help prevent nausea and vomiting after your treatment, we encourage you to take your nausea medication, Zofran. Take one every 8 hours as needed.   If you develop nausea and vomiting that is not controlled by your nausea medication, call the clinic.   BELOW ARE SYMPTOMS THAT SHOULD BE REPORTED IMMEDIATELY:  *FEVER GREATER THAN 100.5 F  *CHILLS WITH OR WITHOUT FEVER  NAUSEA AND VOMITING THAT IS NOT CONTROLLED WITH YOUR NAUSEA MEDICATION  *UNUSUAL SHORTNESS OF BREATH  *UNUSUAL BRUISING OR BLEEDING  TENDERNESS IN MOUTH AND THROAT WITH OR WITHOUT PRESENCE OF ULCERS  *URINARY PROBLEMS  *BOWEL PROBLEMS  UNUSUAL RASH Items with * indicate a potential emergency and should be followed up as soon as possible.  Feel free to call the clinic should you have any questions or concerns. The clinic phone number is [phone].

## 2013-11-16 NOTE — Telephone Encounter (Signed)
Message copied by Sabino Snipes on Wed Nov 16, 2013  5:27 PM ------      Message from: Levert Feinstein      Created: Sat Nov 12, 2013  2:33 PM       Call pt:  Still significant protein in urine but down compared with  August value ------

## 2013-11-16 NOTE — Telephone Encounter (Signed)
Notified pt's wife of lab results per Dr Patsy Lager instructions.

## 2013-12-02 ENCOUNTER — Other Ambulatory Visit (HOSPITAL_BASED_OUTPATIENT_CLINIC_OR_DEPARTMENT_OTHER): Payer: Medicare Other

## 2013-12-02 ENCOUNTER — Ambulatory Visit (HOSPITAL_BASED_OUTPATIENT_CLINIC_OR_DEPARTMENT_OTHER): Payer: Medicare Other | Admitting: Oncology

## 2013-12-02 ENCOUNTER — Telehealth: Payer: Self-pay | Admitting: Oncology

## 2013-12-02 ENCOUNTER — Telehealth: Payer: Self-pay | Admitting: *Deleted

## 2013-12-02 VITALS — BP 145/74 | HR 92 | Temp 97.8°F | Resp 18 | Ht 71.0 in | Wt 229.8 lb

## 2013-12-02 DIAGNOSIS — C9002 Multiple myeloma in relapse: Secondary | ICD-10-CM

## 2013-12-02 DIAGNOSIS — E1142 Type 2 diabetes mellitus with diabetic polyneuropathy: Secondary | ICD-10-CM

## 2013-12-02 DIAGNOSIS — N189 Chronic kidney disease, unspecified: Secondary | ICD-10-CM

## 2013-12-02 DIAGNOSIS — E1149 Type 2 diabetes mellitus with other diabetic neurological complication: Secondary | ICD-10-CM

## 2013-12-02 DIAGNOSIS — R079 Chest pain, unspecified: Secondary | ICD-10-CM

## 2013-12-02 DIAGNOSIS — Z86718 Personal history of other venous thrombosis and embolism: Secondary | ICD-10-CM

## 2013-12-02 DIAGNOSIS — I129 Hypertensive chronic kidney disease with stage 1 through stage 4 chronic kidney disease, or unspecified chronic kidney disease: Secondary | ICD-10-CM

## 2013-12-02 LAB — COMPREHENSIVE METABOLIC PANEL (CC13)
AST: 19 U/L (ref 5–34)
Albumin: 3.6 g/dL (ref 3.5–5.0)
Alkaline Phosphatase: 128 U/L (ref 40–150)
Anion Gap: 11 mEq/L (ref 3–11)
BUN: 11.3 mg/dL (ref 7.0–26.0)
CO2: 26 mEq/L (ref 22–29)
Calcium: 9.4 mg/dL (ref 8.4–10.4)
Chloride: 103 mEq/L (ref 98–109)
Glucose: 220 mg/dl — ABNORMAL HIGH (ref 70–140)
Potassium: 3.9 mEq/L (ref 3.5–5.1)

## 2013-12-02 LAB — CBC WITH DIFFERENTIAL/PLATELET
BASO%: 0.7 % (ref 0.0–2.0)
Basophils Absolute: 0 10*3/uL (ref 0.0–0.1)
Eosinophils Absolute: 0 10*3/uL (ref 0.0–0.5)
HGB: 13.5 g/dL (ref 13.0–17.1)
LYMPH%: 6.5 % — ABNORMAL LOW (ref 14.0–49.0)
MCHC: 34.2 g/dL (ref 32.0–36.0)
MCV: 97.3 fL (ref 79.3–98.0)
MONO#: 0.4 10*3/uL (ref 0.1–0.9)
MONO%: 10.3 % (ref 0.0–14.0)
NEUT#: 2.9 10*3/uL (ref 1.5–6.5)
Platelets: 86 10*3/uL — ABNORMAL LOW (ref 140–400)
RDW: 14.2 % (ref 11.0–14.6)

## 2013-12-02 NOTE — Progress Notes (Signed)
Hematology and Oncology Follow Up Visit  [name] [name] [id number] 1941-10-06 72 y.o. 12/02/2013 7:22 PM   Principle Diagnosis: Encounter Diagnosis  Name Primary?  . Multiple myeloma in relapse Yes     Interim History:  Followup visit for this 72 year old man with relapsed lambda light chain myeloma initially diagnosed in June 2010. Please see my progress note dated 10/04/2013 for a summary of his treatments to date. He is having a modest response to most recent salvage chemotherapy with a combination of Treanda and prednisone started on 08/25/2013. He has had 4 treatments so far most recent started on November 24 and November 25. Serum free light chains which peaked at 167 mg percent on October 2 slowly falling with current value of 91 mg percent recorded on November 13. Today's values pending. 24-hour urine total protein 443 mg on August 7 10-268 mg as of November 18. Other than generalized fatigue, he is tolerating the treatments well.  He is having some new intermittent left sided rib pain and persistent intermittent right-sided pain. No interim infections. Persistent but stable leg edema.  Medications: reviewed  Allergies: No Known Allergies  Review of Systems: Hematology:  No bruising or bleeding ENT ROS: No sore throat Breast ROS: Respiratory ROS: No cough or dyspnea Cardiovascular ROS:   No ischemic chest pain or palpitations Gastrointestinal ROS: No abdominal pain or change in bowel habit Genito-Urinary ROS no urinary tract symptoms Musculoskeletal ROS: See above Neurological ROS: Resolved paresthesias of his hands. Still with paresthesias of his feet. He states that he can feel the floor and the gas pedal on his car. Dermatological ROS: No rash Remaining ROS negative.  Physical Exam: Blood pressure 145/74, pulse 92, temperature 97.8 F (36.6 C), temperature source Oral, resp. rate 18, height 5\' 11"  (1.803 m), weight 229 lb 12.8 oz (104.237 kg), SpO2 98.00%. Wt  Readings from Last 3 Encounters:  12/02/13 229 lb 12.8 oz (104.237 kg)  11/03/13 227 lb 14.4 oz (103.375 kg)  10/04/13 233 lb 1.6 oz (105.733 kg)     General appearance: Well-nourished African American man  HENNT: Pharynx no erythema, exudate, mass, or ulcer. No thyromegaly or thyroid nodules Lymph nodes: No cervical, supraclavicular, or axillary lymphadenopathy Breasts:  Lungs: Clear to auscultation, resonant to percussion throughout Heart: Regular rhythm, no murmur, no gallop, no rub, no click, no edema Abdomen: Soft, nontender, normal bowel sounds, no mass, no organomegaly Extremities: One-2+ ankle edema, no calf tenderness Musculoskeletal: no joint deformities GU:  Vascular: Carotid pulses 2+, no bruits,  Neurologic: Alert, oriented, PERRLA,   cranial nerves grossly normal, motor strength 5 over 5, reflexes 1+ symmetric, upper body coordination normal, gait normal, sensation intact to vibration over the fingertips by tuning fork exam Skin: No rash or ecchymosis  Lab Results: CBC W/Diff    Component Value Date/Time   WBC 3.6* 12/02/2013 0915   WBC 5.1 12/30/2012 0731   WBC 5.6 06/13/2009 1503   RBC 4.06* 12/02/2013 0915   RBC 4.63 12/30/2012 0731   HGB 13.5 12/02/2013 0915   HGB 14.9 12/30/2012 0731   HGB 9.0* 06/13/2009 1503   HCT 39.6 12/02/2013 0915   HCT 42.9 12/30/2012 0731   HCT 25.6* 06/13/2009 1503   PLT 86* 12/02/2013 0915   PLT 101* 12/30/2012 0731   PLT 189 06/13/2009 1503   MCV 97.3 12/02/2013 0915   MCV 92.7 12/30/2012 0731   MCV 90 06/13/2009 1503   MCH 33.3 12/02/2013 0915   MCH 32.2 12/30/2012 0731   MCH 31.4  06/13/2009 1503   MCHC 34.2 12/02/2013 0915   MCHC 34.7 12/30/2012 0731   MCHC 35.0 06/13/2009 1503   RDW 14.2 12/02/2013 0915   RDW 14.8 12/30/2012 0731   RDW 15.2* 06/13/2009 1503   LYMPHSABS 0.2* 12/02/2013 0915   LYMPHSABS 0.9 12/30/2012 0731   LYMPHSABS 1.5 06/13/2009 1503   MONOABS 0.4 12/02/2013 0915   MONOABS 0.3 12/30/2012 0731   EOSABS 0.0 12/02/2013 0915    EOSABS 0.1 12/30/2012 0731   EOSABS 0.1 06/13/2009 1503   BASOSABS 0.0 12/02/2013 0915   BASOSABS 0.0 12/30/2012 0731   BASOSABS 0.0 06/13/2009 1503     Chemistry      Component Value Date/Time   NA 141 12/02/2013 0915   NA 139 07/20/2012 1123   NA 145 06/13/2009 1503   K 3.9 12/02/2013 0915   K 4.1 07/20/2012 1123   K 4.8* 06/13/2009 1503   CL 104 05/18/2013 1113   CL 106 07/20/2012 1123   CL 108 06/13/2009 1503   CO2 26 12/02/2013 0915   CO2 26 07/20/2012 1123   CO2 29 06/13/2009 1503   BUN 11.3 12/02/2013 0915   BUN 12 07/20/2012 1123   BUN 49* 06/13/2009 1503   CREATININE 1.3 12/02/2013 0915   CREATININE 1.33 07/22/2012 1334   CREATININE 1.47* 07/20/2012 1123   CREATININE 2.9* 06/13/2009 1503      Component Value Date/Time   CALCIUM 9.4 12/02/2013 0915   CALCIUM 8.8 07/20/2012 1123   CALCIUM 10.5* 06/13/2009 1503   ALKPHOS 128 12/02/2013 0915   ALKPHOS 137* 07/20/2012 1123   ALKPHOS 135* 06/13/2009 1503   AST 19 12/02/2013 0915   AST 13 07/20/2012 1123   AST 21 06/13/2009 1503   ALT <6 12/02/2013 0915   ALT <8 07/20/2012 1123   ALT 25 06/13/2009 1503   BILITOT 0.73 12/02/2013 0915   BILITOT 0.8 07/20/2012 1123   BILITOT 0.60 06/13/2009 1503      Impression:  #1. Relapsed lambda light chain multiple myeloma  He is now out 4-1/2 years from initial diagnosis. He is slowly responding to treatment. I will continue treatment at plus prednisone. His family will be in town from out of state with his grandchildren during Christmas week so I will reschedule his next treatment for the following week.   #2. 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  Steady improvement of the paresthesias of his hands over time. I feel this opens the door to using agents such as kyprolis or an oral second generation proteosome inhibitor which will hopefully be available within the next year.  #3. Type 2 diabetes. Exacerbated by steroids. He continues metformin. him off the  steroids.  .  #3. Right lower extremity deep vein thrombosis 05/14/2011. He continues full-dose Coumadin anticoagulation.   #4.Essential hypertension. Controlled on current medication.   #5. Parkinson's disease. Decreased tremor on current regimen.   #6. Hyperlipidemia.   #7.Degenerative arthritis.   #8.Chronic renal insufficiency. Creatinine level currently stable.   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     CC: Patient Care Team: Katy Apo, MD as PCP - General (Internal Medicine) Levert Feinstein, MD as Consulting Physician (Oncology) Genevive Bi as Consulting Physician (Internal Medicine)   Levert Feinstein, MD 12/12/20147:22 PM

## 2013-12-02 NOTE — Telephone Encounter (Signed)
Gave pt appt for lab, MD ,ML and chemo for december and Janaury 2015

## 2013-12-02 NOTE — Telephone Encounter (Signed)
Per staff message and POF I have scheduled appts.  Advised scheduler to move lab JMW  

## 2013-12-06 ENCOUNTER — Telehealth: Payer: Self-pay | Admitting: Neurology

## 2013-12-06 DIAGNOSIS — C9001 Multiple myeloma in remission: Secondary | ICD-10-CM

## 2013-12-06 MED ORDER — RASAGILINE MESYLATE 1 MG PO TABS: 1.0000 mg | ORAL_TABLET | Freq: Every day | ORAL | Status: DC

## 2013-12-06 NOTE — Telephone Encounter (Signed)
Patient was last seen 08/2012.  They will need to schedule appt.  I sent one month Rx and noted appt was needed.  As well, I called patient back at home 5 times, each time the line was busy.  Called cell, got no answer.  Left message.

## 2013-12-07 LAB — KAPPA/LAMBDA LIGHT CHAINS
Kappa free light chain: 0.12 mg/dL — ABNORMAL LOW (ref 0.33–1.94)
Lambda Free Lght Chn: 96.5 mg/dL — ABNORMAL HIGH (ref 0.57–2.63)

## 2013-12-15 ENCOUNTER — Inpatient Hospital Stay (HOSPITAL_COMMUNITY)
Admission: EM | Admit: 2013-12-15 | Discharge: 2013-12-21 | DRG: 193 | Disposition: A | Discharge status: Home Health | Payer: Medicare Other | Attending: Internal Medicine | Admitting: Internal Medicine

## 2013-12-15 ENCOUNTER — Emergency Department (HOSPITAL_COMMUNITY): Payer: Medicare Other

## 2013-12-15 ENCOUNTER — Encounter (HOSPITAL_COMMUNITY): Payer: Self-pay | Admitting: Emergency Medicine

## 2013-12-15 DIAGNOSIS — I10 Essential (primary) hypertension: Secondary | ICD-10-CM

## 2013-12-15 DIAGNOSIS — Z86718 Personal history of other venous thrombosis and embolism: Secondary | ICD-10-CM

## 2013-12-15 DIAGNOSIS — J189 Pneumonia, unspecified organism: Secondary | ICD-10-CM | POA: Diagnosis present

## 2013-12-15 DIAGNOSIS — I129 Hypertensive chronic kidney disease with stage 1 through stage 4 chronic kidney disease, or unspecified chronic kidney disease: Secondary | ICD-10-CM | POA: Diagnosis present

## 2013-12-15 DIAGNOSIS — Z7982 Long term (current) use of aspirin: Secondary | ICD-10-CM

## 2013-12-15 DIAGNOSIS — N182 Chronic kidney disease, stage 2 (mild): Secondary | ICD-10-CM | POA: Diagnosis present

## 2013-12-15 DIAGNOSIS — G20 Parkinson's disease: Secondary | ICD-10-CM | POA: Diagnosis present

## 2013-12-15 DIAGNOSIS — D61810 Antineoplastic chemotherapy induced pancytopenia: Secondary | ICD-10-CM | POA: Diagnosis present

## 2013-12-15 DIAGNOSIS — Z87891 Personal history of nicotine dependence: Secondary | ICD-10-CM

## 2013-12-15 DIAGNOSIS — C9002 Multiple myeloma in relapse: Secondary | ICD-10-CM | POA: Diagnosis present

## 2013-12-15 DIAGNOSIS — M129 Arthropathy, unspecified: Secondary | ICD-10-CM | POA: Diagnosis present

## 2013-12-15 DIAGNOSIS — N189 Chronic kidney disease, unspecified: Secondary | ICD-10-CM | POA: Diagnosis present

## 2013-12-15 DIAGNOSIS — J9801 Acute bronchospasm: Secondary | ICD-10-CM | POA: Diagnosis not present

## 2013-12-15 DIAGNOSIS — D649 Anemia, unspecified: Secondary | ICD-10-CM | POA: Diagnosis present

## 2013-12-15 DIAGNOSIS — G20A1 Parkinson's disease without dyskinesia, without mention of fluctuations: Secondary | ICD-10-CM | POA: Diagnosis present

## 2013-12-15 DIAGNOSIS — E119 Type 2 diabetes mellitus without complications: Secondary | ICD-10-CM | POA: Diagnosis present

## 2013-12-15 DIAGNOSIS — T451X5A Adverse effect of antineoplastic and immunosuppressive drugs, initial encounter: Secondary | ICD-10-CM | POA: Diagnosis present

## 2013-12-15 DIAGNOSIS — Z794 Long term (current) use of insulin: Secondary | ICD-10-CM

## 2013-12-15 DIAGNOSIS — K219 Gastro-esophageal reflux disease without esophagitis: Secondary | ICD-10-CM | POA: Diagnosis present

## 2013-12-15 DIAGNOSIS — E782 Mixed hyperlipidemia: Secondary | ICD-10-CM | POA: Diagnosis present

## 2013-12-15 DIAGNOSIS — E1149 Type 2 diabetes mellitus with other diabetic neurological complication: Secondary | ICD-10-CM | POA: Diagnosis present

## 2013-12-15 DIAGNOSIS — J1100 Influenza due to unidentified influenza virus with unspecified type of pneumonia: Principal | ICD-10-CM | POA: Diagnosis present

## 2013-12-15 LAB — URINE MICROSCOPIC-ADD ON

## 2013-12-15 LAB — CBC WITH DIFFERENTIAL/PLATELET
Basophils Absolute: 0 10*3/uL (ref 0.0–0.1)
Basophils Relative: 0 % (ref 0–1)
Eosinophils Absolute: 0 10*3/uL (ref 0.0–0.7)
HCT: 37.2 % — ABNORMAL LOW (ref 39.0–52.0)
Hemoglobin: 12.9 g/dL — ABNORMAL LOW (ref 13.0–17.0)
Lymphocytes Relative: 8 % — ABNORMAL LOW (ref 12–46)
Lymphs Abs: 0.3 10*3/uL — ABNORMAL LOW (ref 0.7–4.0)
MCH: 32.8 pg (ref 26.0–34.0)
MCHC: 34.7 g/dL (ref 30.0–36.0)
MCV: 94.7 fL (ref 78.0–100.0)
Monocytes Absolute: 0.2 10*3/uL (ref 0.1–1.0)
Neutro Abs: 3.3 10*3/uL (ref 1.7–7.7)
Neutrophils Relative %: 87 % — ABNORMAL HIGH (ref 43–77)
RDW: 13.9 % (ref 11.5–15.5)

## 2013-12-15 LAB — URINALYSIS, ROUTINE W REFLEX MICROSCOPIC
Glucose, UA: NEGATIVE mg/dL
Leukocytes, UA: NEGATIVE
Protein, ur: 30 mg/dL — AB
Specific Gravity, Urine: 1.014 (ref 1.005–1.030)
pH: 6 (ref 5.0–8.0)

## 2013-12-15 LAB — COMPREHENSIVE METABOLIC PANEL
ALT: 19 U/L (ref 0–53)
AST: 26 U/L (ref 0–37)
Albumin: 3.8 g/dL (ref 3.5–5.2)
BUN: 11 mg/dL (ref 6–23)
Chloride: 100 mEq/L (ref 96–112)
Creatinine, Ser: 1.53 mg/dL — ABNORMAL HIGH (ref 0.50–1.35)
Total Bilirubin: 0.8 mg/dL (ref 0.3–1.2)
Total Protein: 6.4 g/dL (ref 6.0–8.3)

## 2013-12-15 MED ORDER — SODIUM CHLORIDE 0.9 % IV SOLN
INTRAVENOUS | Status: AC
  Administered 2013-12-16: 01:00:00 via INTRAVENOUS

## 2013-12-15 MED ORDER — SODIUM CHLORIDE 0.9 % IV BOLUS (SEPSIS)
1000.0000 mL | Freq: Once | INTRAVENOUS | Status: AC
  Administered 2013-12-15: 1000 mL via INTRAVENOUS

## 2013-12-15 MED ORDER — DEXTROSE 5 % IV SOLN
500.0000 mg | Freq: Once | INTRAVENOUS | Status: AC
  Administered 2013-12-16: 500 mg via INTRAVENOUS
  Filled 2013-12-15: qty 500

## 2013-12-15 MED ORDER — ACETAMINOPHEN 325 MG PO TABS
650.0000 mg | ORAL_TABLET | Freq: Once | ORAL | Status: AC
  Administered 2013-12-15: 650 mg via ORAL
  Filled 2013-12-15: qty 2

## 2013-12-15 MED ORDER — DEXTROSE 5 % IV SOLN
1.0000 g | Freq: Once | INTRAVENOUS | Status: AC
  Administered 2013-12-16: 1 g via INTRAVENOUS
  Filled 2013-12-15: qty 10

## 2013-12-15 NOTE — ED Provider Notes (Signed)
CSN: [id number]     Arrival date & time 12/15/13  2116 History   First MD Initiated Contact with Patient 12/15/13 2136     Chief Complaint  Patient presents with  . Weakness   (Consider location/radiation/quality/duration/timing/severity/associated sxs/prior Treatment) Patient is a 71 y.o. male presenting with URI.  URI Presenting symptoms: congestion, cough, fatigue and rhinorrhea   Presenting symptoms: no fever and no sore throat   Cough:    Cough characteristics:  Non-productive and dry Severity:  Moderate Onset quality:  Sudden Duration:  2 days Timing:  Constant Progression:  Worsening Chronicity:  New Relieved by:  Nothing Associated symptoms: no arthralgias, no headaches, no neck pain, no sneezing and no swollen glands   Risk factors: being elderly and sick contacts (grandson with similar symptoms)   Risk factors: no diabetes mellitus     Past Medical History  Diagnosis Date  . Diabetes mellitus   . GERD (gastroesophageal reflux disease)   . Anemia   . Hypertension   . Prostatic hyperplasia   . Arthritis   . Multiple myeloma in relapse 12/24/2011  . Benign essential HTN 12/24/2011  . CRI (chronic renal insufficiency) 12/26/2011  . 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 DVT of lower extremity (deep venous thrombosis) 05/14/2011  . Hyperlipidemia, mixed 12/26/2011  . Herpes zoster infection 03/11/2010  . DM II (diabetes mellitus, type II), controlled 12/26/2011  . Pharyngitis, acute 03/21/2013  . 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 Diabetic neuropathy, type II diabetes mellitus 05/31/2013  . Urethritis 10/04/2013   Past Surgical History  Procedure Laterality Date  . Foot surgery Left    History reviewed. No pertinent family history. History  Substance Use Topics  . Smoking status: Former Smoker    Quit date: 03/08/2008  . Smokeless tobacco: Never Used  . Alcohol Use: No    Review of Systems  Constitutional:  Positive for fatigue. Negative for fever and chills.  HENT: Positive for congestion and rhinorrhea. Negative for sneezing and sore throat.   Eyes: Negative for pain.  Respiratory: Positive for cough. Negative for shortness of breath.   Cardiovascular: Negative for chest pain.  Gastrointestinal: Negative for nausea, vomiting and abdominal pain.  Genitourinary: Negative for dysuria and flank pain.  Musculoskeletal: Negative for arthralgias, back pain and neck pain.  Skin: Negative for rash.  Neurological: Negative for seizures and headaches.    Allergies  Review of patient's allergies indicates no known allergies.  Home Medications   Current Outpatient Rx  Name  Route  Sig  Dispense  Refill  . acetaminophen (TYLENOL) 500 MG tablet   Oral   Take 500 mg by mouth every 6 (six) hours as needed. For pain.         Marland Kitchen aspirin EC 81 MG tablet   Oral   Take 81 mg by mouth daily after breakfast.          . atorvastatin (LIPITOR) 10 MG tablet   Oral   Take 10 mg by mouth at bedtime.           . BD ULTRA-FINE PEN NEEDLES 29G X 12.7MM MISC   Subcutaneous   Inject 30 Units into the skin daily.          . carbidopa-levodopa (SINEMET IR) 25-100 MG per tablet      TAKE 1 TABLET THREE TIMES A DAY   90 tablet   3     PLEASE SCHEDULE APPT   .  furosemide (LASIX) 20 MG tablet      TAKE 1 TABLET (20 MG TOTAL) BY MOUTH DAILY.   60 tablet   1   . Insulin Glargine (LANTUS SOLOSTAR [place])   Subcutaneous   Inject 30 Units into the skin every evening.          . ondansetron (ZOFRAN ODT) 8 MG disintegrating tablet   Oral   Take 1 tablet (8 mg total) by mouth every 8 (eight) hours as needed for nausea.   30 tablet   prn   . oxyCODONE (OXY IR/ROXICODONE) 5 MG immediate release tablet   Oral   Take 5-10 mg by mouth every 4 (four) hours as needed. For pain.         Marland Kitchen oxyCODONE (OXYCONTIN) 20 MG 12 hr tablet   Oral   Take 20 mg by mouth every 12 (twelve) hours as needed. For  pain.         . polyethylene glycol (MIRALAX / GLYCOLAX) packet   Oral   Take 17 g by mouth daily after breakfast.          . predniSONE (DELTASONE) 20 MG tablet      Take 4 tablets = 80mg  with food for 4 days, starting day after chemotherapy.   100 tablet   0   . pregabalin (LYRICA) 100 MG capsule   Oral   Take 1 capsule (100 mg total) by mouth daily after breakfast.   90 capsule   1   . PRESCRIPTION MEDICATION      Pt gets chemo at [facility]. Last treatment was on 11-30- 31. Pt takes 2 days in row once monthly. Followed by Dr Cyndie Chime, next treatment is scheduled for Dec 30 and 31.         . rasagiline (AZILECT) 1 MG TABS tablet   Oral   Take 1 tablet (1 mg total) by mouth daily.   30 tablet   0     PLEASE SCHEDULE APPT    BP 146/81  Pulse 118  Temp(Src) 102.9 F (39.4 C) (Oral)  Resp 22  Ht 6' (1.829 m)  Wt 220 lb (99.791 kg)  BMI 29.83 kg/m2  SpO2 95% Physical Exam  Constitutional: He is oriented to person, place, and time. He appears well-developed and well-nourished. No distress.  HENT:  Head: Normocephalic and atraumatic.  Eyes: Pupils are equal, round, and reactive to light.  Neck: Normal range of motion.  Cardiovascular: Normal rate and regular rhythm.   Pulmonary/Chest: Effort normal. He has wheezes.  Abdominal: Soft. He exhibits no distension. There is no tenderness.  Musculoskeletal: Normal range of motion.  Neurological: He is alert and oriented to person, place, and time. GCS eye subscore is 4. GCS verbal subscore is 5. GCS motor subscore is 6.  R arm tremor  Skin: Skin is warm. He is not diaphoretic.    ED Course  Procedures (including critical care time) Labs Review Labs Reviewed  CBC WITH DIFFERENTIAL - Abnormal; Notable for the following:    WBC 3.9 (*)    RBC 3.93 (*)    Hemoglobin 12.9 (*)    HCT 37.2 (*)    Platelets 80 (*)    Neutrophils Relative % 87 (*)    Lymphocytes Relative 8 (*)    Lymphs Abs 0.3 (*)    All other  components within normal limits  COMPREHENSIVE METABOLIC PANEL - Abnormal; Notable for the following:    Potassium 3.2 (*)    Glucose, Bld 218 (*)  Creatinine, Ser 1.53 (*)    Alkaline Phosphatase 153 (*)    GFR calc non Af Amer 43 (*)    GFR calc Af Amer 50 (*)    All other components within normal limits  URINALYSIS, ROUTINE W REFLEX MICROSCOPIC - Abnormal; Notable for the following:    Hgb urine dipstick MODERATE (*)    Ketones, ur 15 (*)    Protein, ur 30 (*)    All other components within normal limits  CG4 I-STAT (LACTIC ACID) - Abnormal; Notable for the following:    Lactic Acid, Venous 3.04 (*)    All other components within normal limits  URINE CULTURE  CULTURE, BLOOD (ROUTINE X 2)  CULTURE, BLOOD (ROUTINE X 2)  URINE MICROSCOPIC-ADD ON  INFLUENZA PANEL BY PCR   Imaging Review Dg Chest Port 1 View  12/15/2013   CLINICAL DATA:  Cough, fever and weakness.  EXAM: PORTABLE CHEST - 1 VIEW  COMPARISON:  Chest radiograph performed 03/08/2012  FINDINGS: The lungs are mildly hypoexpanded. Mild left basilar airspace opacity may reflect atelectasis or possibly mild pneumonia. Mild vascular crowding is noted. There is no evidence of pleural effusion or pneumothorax.  The cardiomediastinal silhouette is within normal limits. No acute osseous abnormalities are seen.  IMPRESSION: Lungs mildly hypoexpanded. Mild left basilar airspace opacity may reflect atelectasis or possibly mild pneumonia.   Electronically Signed   By: Roanna Raider M.D.   On: 12/15/2013 22:18    EKG Interpretation   None       MDM   1. Community acquired pneumonia     72 year old male with a history of relapsing multiple myeloma who presents today with 2 day history of cough and congestion with associated fatigue.  On arrival the patient had a temperature of 100.3 F, patient was tachycardic with a heart rate in the 120s, and the patient was tachypneic with a respiratory rate in the 20s. The patient was  mentating appropriately. Labs were drawn the patient including 2 sets of blood cultures and the patient was started on IV fluids. A chest x-ray was also performed.  Chest x-ray was reviewed and demonstrate lungs are mildly hypoexpanded with a mild left basilar airspace opacity. This is likely a mild pneumonia on the left side as it obscures the border of the left diaphragm. The patient has a white blood cell count 3.9, creatinine of 1.53 slightly elevated from his baseline of 1.3, hemoglobin of 12.9 which is his baseline, no evidence of infection in the urine. Because of the suspected source of pulmonary artery and as a cause of his systemic inflammatory response syndrome, the patient was started on Rocephin and azithromycin. The hospitalist was consulted for admission for this patient. They agreed to admit the patient and requested the patient be placed on droplet percussions and to have a influenza PCR obtained prior to admission.  The patient was admitted to the floor in stable condition. The patient was seen and evaluated by myself and by the attending physician Dr. Roque Cash, MD 12/16/13 [phone]

## 2013-12-15 NOTE — Care Management (Signed)
Peak flow preformed on pt results 1.20 VC. No wheezes noted.

## 2013-12-15 NOTE — ED Notes (Signed)
Pt presents with generalized weakness ongoing for 2 weeks per his report.  +cough.  +fever.

## 2013-12-15 NOTE — ED Notes (Signed)
Reports sob x 2 weeks worsening today.  Wheezes heard by EMS, [facility] given en route, pt reports improvement.   C/O pain lower back for over a month.

## 2013-12-16 ENCOUNTER — Encounter (HOSPITAL_COMMUNITY): Payer: Self-pay | Admitting: Internal Medicine

## 2013-12-16 DIAGNOSIS — C9002 Multiple myeloma in relapse: Secondary | ICD-10-CM

## 2013-12-16 DIAGNOSIS — J189 Pneumonia, unspecified organism: Secondary | ICD-10-CM

## 2013-12-16 DIAGNOSIS — I10 Essential (primary) hypertension: Secondary | ICD-10-CM

## 2013-12-16 DIAGNOSIS — G20A1 Parkinson's disease without dyskinesia, without mention of fluctuations: Secondary | ICD-10-CM | POA: Diagnosis present

## 2013-12-16 DIAGNOSIS — N189 Chronic kidney disease, unspecified: Secondary | ICD-10-CM

## 2013-12-16 DIAGNOSIS — G20 Parkinson's disease: Secondary | ICD-10-CM | POA: Diagnosis present

## 2013-12-16 LAB — CBC WITH DIFFERENTIAL/PLATELET
Basophils Absolute: 0 10*3/uL (ref 0.0–0.1)
Basophils Relative: 0 % (ref 0–1)
Eosinophils Relative: 0 % (ref 0–5)
Lymphocytes Relative: 10 % — ABNORMAL LOW (ref 12–46)
Lymphs Abs: 0.4 10*3/uL — ABNORMAL LOW (ref 0.7–4.0)
Neutro Abs: 3.4 10*3/uL (ref 1.7–7.7)
Neutrophils Relative %: 84 % — ABNORMAL HIGH (ref 43–77)
Platelets: DECREASED 10*3/uL (ref 150–400)
RBC: 3.54 MIL/uL — ABNORMAL LOW (ref 4.22–5.81)
RDW: 14.1 % (ref 11.5–15.5)
WBC: 4 10*3/uL (ref 4.0–10.5)

## 2013-12-16 LAB — COMPREHENSIVE METABOLIC PANEL
Albumin: 2.9 g/dL — ABNORMAL LOW (ref 3.5–5.2)
Alkaline Phosphatase: 109 U/L (ref 39–117)
BUN: 10 mg/dL (ref 6–23)
Chloride: 104 mEq/L (ref 96–112)
Creatinine, Ser: 1.28 mg/dL (ref 0.50–1.35)
GFR calc Af Amer: 62 mL/min — ABNORMAL LOW (ref >90)
GFR calc non Af Amer: 54 mL/min — ABNORMAL LOW (ref >90)
Glucose, Bld: 192 mg/dL — ABNORMAL HIGH (ref 70–99)
Potassium: 3.6 mEq/L (ref 3.5–5.1)
Sodium: 138 mEq/L (ref 135–145)
Total Bilirubin: 0.5 mg/dL (ref 0.3–1.2)

## 2013-12-16 LAB — INFLUENZA PANEL BY PCR (TYPE A & B)
H1N1 flu by pcr: NOT DETECTED
Influenza A By PCR: POSITIVE — AB
Influenza B By PCR: NEGATIVE

## 2013-12-16 LAB — TSH: TSH: 0.252 u[IU]/mL — ABNORMAL LOW (ref 0.350–4.500)

## 2013-12-16 LAB — TROPONIN I: Troponin I: <0.3 ng/mL (ref <0.30)

## 2013-12-16 LAB — GLUCOSE, CAPILLARY
Glucose-Capillary: 120 mg/dL — ABNORMAL HIGH (ref 70–99)
Glucose-Capillary: 145 mg/dL — ABNORMAL HIGH (ref 70–99)
Glucose-Capillary: 147 mg/dL — ABNORMAL HIGH (ref 70–99)
Glucose-Capillary: 153 mg/dL — ABNORMAL HIGH (ref 70–99)

## 2013-12-16 LAB — PLATELET COUNT: Platelets: 82 10*3/uL — ABNORMAL LOW (ref 150–400)

## 2013-12-16 MED ORDER — CARBIDOPA-LEVODOPA 25-100 MG PO TABS
1.0000 | ORAL_TABLET | Freq: Three times a day (TID) | ORAL | Status: DC
  Administered 2013-12-16 – 2013-12-19 (×12): 1 via ORAL
  Filled 2013-12-16 (×15): qty 1

## 2013-12-16 MED ORDER — ACETAMINOPHEN 325 MG PO TABS
650.0000 mg | ORAL_TABLET | Freq: Four times a day (QID) | ORAL | Status: DC | PRN
  Administered 2013-12-16 (×2): 650 mg via ORAL
  Filled 2013-12-16 (×3): qty 2

## 2013-12-16 MED ORDER — ONDANSETRON HCL 4 MG PO TABS: 4.0000 mg | ORAL_TABLET | Freq: Four times a day (QID) | ORAL | Status: DC | PRN

## 2013-12-16 MED ORDER — OSELTAMIVIR PHOSPHATE 75 MG PO CAPS
75.0000 mg | ORAL_CAPSULE | Freq: Every day | ORAL | Status: DC
  Administered 2013-12-16: 75 mg via ORAL
  Filled 2013-12-16 (×2): qty 1

## 2013-12-16 MED ORDER — DEXTROSE 5 % IV SOLN
1.0000 g | INTRAVENOUS | Status: DC
  Administered 2013-12-17 – 2013-12-20 (×5): 1 g via INTRAVENOUS
  Filled 2013-12-16 (×6): qty 10

## 2013-12-16 MED ORDER — PREGABALIN 50 MG PO CAPS
100.0000 mg | ORAL_CAPSULE | Freq: Every day | ORAL | Status: DC
  Administered 2013-12-16 – 2013-12-21 (×6): 100 mg via ORAL
  Filled 2013-12-16 (×6): qty 2

## 2013-12-16 MED ORDER — ACETAMINOPHEN 325 MG PO TABS
650.0000 mg | ORAL_TABLET | Freq: Once | ORAL | Status: AC
  Administered 2013-12-16: 650 mg via ORAL

## 2013-12-16 MED ORDER — ONDANSETRON HCL 4 MG/2ML IJ SOLN: 4.0000 mg | Freq: Four times a day (QID) | INTRAMUSCULAR | Status: DC | PRN

## 2013-12-16 MED ORDER — ASPIRIN EC 81 MG PO TBEC
81.0000 mg | DELAYED_RELEASE_TABLET | Freq: Every day | ORAL | Status: DC
  Administered 2013-12-16 – 2013-12-21 (×6): 81 mg via ORAL
  Filled 2013-12-16 (×8): qty 1

## 2013-12-16 MED ORDER — OXYCODONE HCL ER 10 MG PO T12A: 20.0000 mg | EXTENDED_RELEASE_TABLET | Freq: Two times a day (BID) | ORAL | Status: DC | PRN

## 2013-12-16 MED ORDER — FUROSEMIDE 20 MG PO TABS
20.0000 mg | ORAL_TABLET | Freq: Every day | ORAL | Status: DC
  Filled 2013-12-16: qty 1

## 2013-12-16 MED ORDER — INSULIN ASPART 100 UNIT/ML ~~LOC~~ SOLN
0.0000 [IU] | Freq: Three times a day (TID) | SUBCUTANEOUS | Status: DC
  Administered 2013-12-17 – 2013-12-18 (×6): 1 [IU] via SUBCUTANEOUS
  Administered 2013-12-19: 5 [IU] via SUBCUTANEOUS
  Administered 2013-12-19: 1 [IU] via SUBCUTANEOUS
  Administered 2013-12-19: 2 [IU] via SUBCUTANEOUS
  Administered 2013-12-20 – 2013-12-21 (×4): 1 [IU] via SUBCUTANEOUS

## 2013-12-16 MED ORDER — POLYETHYLENE GLYCOL 3350 17 G PO PACK
17.0000 g | PACK | Freq: Every day | ORAL | Status: DC
  Administered 2013-12-16 – 2013-12-21 (×6): 17 g via ORAL
  Filled 2013-12-16 (×8): qty 1

## 2013-12-16 MED ORDER — ACETAMINOPHEN 650 MG RE SUPP: 650.0000 mg | Freq: Four times a day (QID) | RECTAL | Status: DC | PRN

## 2013-12-16 MED ORDER — OSELTAMIVIR PHOSPHATE 30 MG PO CAPS
30.0000 mg | ORAL_CAPSULE | Freq: Two times a day (BID) | ORAL | Status: DC
  Filled 2013-12-16 (×2): qty 1

## 2013-12-16 MED ORDER — DEXTROSE 5 % IV SOLN
500.0000 mg | INTRAVENOUS | Status: DC
  Administered 2013-12-16 – 2013-12-19 (×4): 500 mg via INTRAVENOUS
  Filled 2013-12-16 (×5): qty 500

## 2013-12-16 MED ORDER — SODIUM CHLORIDE 0.9 % IJ SOLN
3.0000 mL | Freq: Two times a day (BID) | INTRAMUSCULAR | Status: DC
  Administered 2013-12-17 – 2013-12-21 (×7): 3 mL via INTRAVENOUS

## 2013-12-16 MED ORDER — OXYCODONE HCL 5 MG PO TABS
5.0000 mg | ORAL_TABLET | ORAL | Status: DC | PRN
  Administered 2013-12-18: 5 mg via ORAL
  Filled 2013-12-16: qty 1

## 2013-12-16 MED ORDER — BIOTENE DRY MOUTH MT LIQD
15.0000 mL | Freq: Two times a day (BID) | OROMUCOSAL | Status: DC
  Administered 2013-12-16 – 2013-12-21 (×9): 15 mL via OROMUCOSAL

## 2013-12-16 MED ORDER — ATORVASTATIN CALCIUM 10 MG PO TABS
10.0000 mg | ORAL_TABLET | Freq: Every day | ORAL | Status: DC
  Administered 2013-12-16 – 2013-12-20 (×5): 10 mg via ORAL
  Filled 2013-12-16 (×6): qty 1

## 2013-12-16 MED ORDER — RASAGILINE MESYLATE 1 MG PO TABS
1.0000 mg | ORAL_TABLET | Freq: Every day | ORAL | Status: DC
  Administered 2013-12-16 – 2013-12-21 (×6): 1 mg via ORAL
  Filled 2013-12-16 (×6): qty 1

## 2013-12-16 MED ORDER — ONDANSETRON 8 MG PO TBDP: 8.0000 mg | ORAL_TABLET | Freq: Three times a day (TID) | ORAL | Status: DC | PRN

## 2013-12-16 NOTE — Progress Notes (Addendum)
  Pt orientation to unit, room and routine. Information packet given to patient and safety video watched.  Admission INP armband ID verified with patient and in place. Pt deferred admission assessment at this time and requests to have it completed in the morning. Side rails in place, fall risk assessment complete with patient verbalizing understanding of risks associated with falls. Pt verbalizes an understanding of how to use the call bell and to call for help before getting out of bed.   Will cont to monitor and assist as needed.  Gilman Schmidt, RN 12/16/2013 1:47 AM

## 2013-12-16 NOTE — Progress Notes (Signed)
Assessment/Plan: Principal Problem:   CAP (community acquired pneumonia) - on appropriate abx. Not on Tamiful but flu test is pending.  Active Problems:   Multiple myeloma in relapse   Benign essential HTN   CRI (chronic renal insufficiency) - check office creatinine   DM II (diabetes mellitus, type II), controlled   Parkinson disease - continue meds. Will have him stay in bed today but plan on getting him up tomorrow.    Subjective: May feel a bit better. Still sore throat from cough. Not dypsneic.   Objective:  Vital Signs: Filed Vitals:   12/15/13 2215 12/15/13 2337 12/16/13 0113 12/16/13 0451  BP: 146/71 146/81 135/67 136/74  Pulse: 129 118 107 97  Temp:  102.9 F (39.4 C) 100.6 F (38.1 C) 101.1 F (38.4 C)  TempSrc:  Oral Oral Oral  Resp: 24 22 24 21   Height:   6' (1.829 m)   Weight:   105 kg (231 lb 7.7 oz)   SpO2: 100% 95% 98% 98%     EXAM: Occasional scattered rhonchi   Intake/Output Summary (Last 24 hours) at 12/16/13 0801 Last data filed at 12/16/13 0600  Gross per 24 hour  Intake   1600 ml  Output    300 ml  Net   1300 ml    Lab Results:  Recent Labs  12/15/13 2159  NA 141  K 3.2*  CL 100  CO2 30  GLUCOSE 218*  BUN 11  CREATININE 1.53*  CALCIUM 8.9    Recent Labs  12/15/13 2159  AST 26  ALT 19  ALKPHOS 153*  BILITOT 0.8  PROT 6.4  ALBUMIN 3.8   No results found for this basename: LIPASE, AMYLASE,  in the last 72 hours  Recent Labs  12/15/13 2159  WBC 3.9*  NEUTROABS 3.3  HGB 12.9*  HCT 37.2*  MCV 94.7  PLT 80*   No results found for this basename: CKTOTAL, CKMB, CKMBINDEX, TROPONINI,  in the last 72 hours No components found with this basename: POCBNP,  No results found for this basename: DDIMER,  in the last 72 hours No results found for this basename: HGBA1C,  in the last 72 hours No results found for this basename: CHOL, HDL, LDLCALC, TRIG, CHOLHDL, LDLDIRECT,  in the last 72 hours No results found for this basename:  TSH, T4TOTAL, FREET3, T3FREE, THYROIDAB,  in the last 72 hours No results found for this basename: VITAMINB12, FOLATE, FERRITIN, TIBC, IRON, RETICCTPCT,  in the last 72 hours  Studies/Results: Dg Chest Port 1 View  12/15/2013   CLINICAL DATA:  Cough, fever and weakness.  EXAM: PORTABLE CHEST - 1 VIEW  COMPARISON:  Chest radiograph performed 03/08/2012  FINDINGS: The lungs are mildly hypoexpanded. Mild left basilar airspace opacity may reflect atelectasis or possibly mild pneumonia. Mild vascular crowding is noted. There is no evidence of pleural effusion or pneumothorax.  The cardiomediastinal silhouette is within normal limits. No acute osseous abnormalities are seen.  IMPRESSION: Lungs mildly hypoexpanded. Mild left basilar airspace opacity may reflect atelectasis or possibly mild pneumonia.   Electronically Signed   By: Roanna Raider M.D.   On: 12/15/2013 22:18   Medications: Medications administered in the last 24 hours reviewed.  Current Medication List reviewed.    LOS: 1 day   [facility] Internal Medicine @ [facility] [phone]) 12/16/2013, 8:01 AM

## 2013-12-16 NOTE — Progress Notes (Signed)
Attempted to call for report. RN unavailable at this time. Per RN, will call back later. Gilman Schmidt

## 2013-12-16 NOTE — H&P (Signed)
[facility] History and Physical  [name] [name] ZOX:[id number] DOB: [birth year] DOA: 12/15/2013  Referring physician: ER physician. PCP: [name] Apo, MD   Chief Complaint: Cough and weakness.  HPI: [name] [name] is a 72 y.o. male with history of relapsing multiple myeloma, chronic kidney disease, diabetes mellitus, hyperlipidemia and Parkinson disease has been experiencing increasing weakness with nonproductive cough over the last one week. Patient is on chemotherapy for multiple myeloma and has received Lasix therapy one week ago. Patient denies any fever chills nausea vomiting abdominal pain any headache or focal deficits. Has some pleuritic type of chest pain but denies any shortness of breath. Patient has cough but unable to bring out any phlegm out. In the ER patient was found to be mildly febrile and chest x-ray showing possible infiltrates has been admitted for pneumonia. Influenza PCR is pending. Patient's lactic acid levels mildly elevated and the blood cultures have been sent.  Review of Systems: As presented in the history of presenting illness, rest negative.  Past Medical History  Diagnosis Date  . Diabetes mellitus   . GERD (gastroesophageal reflux disease)   . Anemia   . Hypertension   . Prostatic hyperplasia   . Arthritis   . Multiple myeloma in relapse 12/24/2011  . Benign essential HTN 12/24/2011  . CRI (chronic renal insufficiency) 12/26/2011  . 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 DVT of lower extremity (deep venous thrombosis) 05/14/2011  . Hyperlipidemia, mixed 12/26/2011  . Herpes zoster infection 03/11/2010  . DM II (diabetes mellitus, type II), controlled 12/26/2011  . Pharyngitis, acute 03/21/2013  . 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 Diabetic neuropathy, type II diabetes mellitus 05/31/2013  . Urethritis 10/04/2013   Past Surgical History  Procedure Laterality Date  . Foot surgery Left    Social  History:  reports that he quit smoking about 5 years ago. He has never used smokeless tobacco. He reports that he does not drink alcohol or use illicit drugs. Where does patient live home. Can patient participate in ADLs? Yes.  No Known Allergies  Family History:  Family History  Problem Relation Age of Onset  . CAD Neg Hx       Prior to Admission medications   Medication Sig Start Date End Date Taking? Authorizing Provider  acetaminophen (TYLENOL) 500 MG tablet Take 500 mg by mouth every 6 (six) hours as needed. For pain.   Yes Historical Provider, MD  aspirin EC 81 MG tablet Take 81 mg by mouth daily after breakfast.    Yes Historical Provider, MD  atorvastatin (LIPITOR) 10 MG tablet Take 10 mg by mouth at bedtime.     Yes Historical Provider, MD  BD ULTRA-FINE PEN NEEDLES 29G X 12.7MM MISC Inject 30 Units into the skin daily.  05/03/13  Yes Historical Provider, MD  carbidopa-levodopa (SINEMET IR) 25-100 MG per tablet TAKE 1 TABLET THREE TIMES A DAY 06/28/13  Yes Levert Feinstein, MD  furosemide (LASIX) 20 MG tablet TAKE 1 TABLET (20 MG TOTAL) BY MOUTH DAILY. 11/16/13  Yes Levert Feinstein, MD  Insulin Glargine (LANTUS SOLOSTAR [place]) Inject 30 Units into the skin every evening.    Yes Historical Provider, MD  ondansetron (ZOFRAN ODT) 8 MG disintegrating tablet Take 1 tablet (8 mg total) by mouth every 8 (eight) hours as needed for nausea. 08/16/13  Yes Levert Feinstein, MD  oxyCODONE (OXY IR/ROXICODONE) 5 MG immediate release tablet Take 5-10 mg by mouth every 4 (four) hours  as needed. For pain. 11/22/12  Yes Levert Feinstein, MD  oxyCODONE (OXYCONTIN) 20 MG 12 hr tablet Take 20 mg by mouth every 12 (twelve) hours as needed. For pain. 12/06/12  Yes Levert Feinstein, MD  polyethylene glycol (MIRALAX / GLYCOLAX) packet Take 17 g by mouth daily after breakfast.    Yes Historical Provider, MD  predniSONE (DELTASONE) 20 MG tablet Take 4 tablets = 80mg  with food for 4 days, starting day after  chemotherapy. 08/27/13  Yes Levert Feinstein, MD  pregabalin (LYRICA) 100 MG capsule Take 1 capsule (100 mg total) by mouth daily after breakfast. 07/14/13  Yes Levert Feinstein, MD  PRESCRIPTION MEDICATION Pt gets chemo at [facility]. Last treatment was on 11-30- 31. Pt takes 2 days in row once monthly. Followed by Dr Cyndie Chime, next treatment is scheduled for Dec 30 and 31.   Yes Historical Provider, MD  rasagiline (AZILECT) 1 MG TABS tablet Take 1 tablet (1 mg total) by mouth daily. 12/06/13  Yes Levert Feinstein, MD    Physical Exam: Filed Vitals:   12/15/13 2200 12/15/13 2215 12/15/13 2337 12/16/13 0113  BP: 156/81 146/71 146/81 135/67  Pulse: 124 129 118 107  Temp:   102.9 F (39.4 C) 100.6 F (38.1 C)  TempSrc:   Oral Oral  Resp: 24 24 22 24   Height:    6' (1.829 m)  Weight:    105 kg (231 lb 7.7 oz)  SpO2: 97% 100% 95% 98%     General:  Well developed and nourished.  Eyes: Anicteric no pallor.  ENT: No discharge from ears eyes nose mouth.  Neck: No mass felt.  Cardiovascular: S1-S2 heard.  Respiratory: No rhonchi crepitations.  Abdomen: Soft nontender bowel sounds present.  Skin: No rash.  Musculoskeletal: Mild edema.  Psychiatric: Appears normal.  Neurologic: Alert oriented to time place and person. Moves all extremities.  Labs on Admission:  Basic Metabolic Panel:  Recent Labs Lab 12/15/13 2159  NA 141  K 3.2*  CL 100  CO2 30  GLUCOSE 218*  BUN 11  CREATININE 1.53*  CALCIUM 8.9   Liver Function Tests:  Recent Labs Lab 12/15/13 2159  AST 26  ALT 19  ALKPHOS 153*  BILITOT 0.8  PROT 6.4  ALBUMIN 3.8   No results found for this basename: LIPASE, AMYLASE,  in the last 168 hours No results found for this basename: AMMONIA,  in the last 168 hours CBC:  Recent Labs Lab 12/15/13 2159  WBC 3.9*  NEUTROABS 3.3  HGB 12.9*  HCT 37.2*  MCV 94.7  PLT 80*   Cardiac Enzymes: No results found for this basename: CKTOTAL, CKMB, CKMBINDEX, TROPONINI,   in the last 168 hours  BNP (last 3 results) No results found for this basename: PROBNP,  in the last 8760 hours CBG: No results found for this basename: GLUCAP,  in the last 168 hours  Radiological Exams on Admission: Dg Chest Port 1 View  12/15/2013   CLINICAL DATA:  Cough, fever and weakness.  EXAM: PORTABLE CHEST - 1 VIEW  COMPARISON:  Chest radiograph performed 03/08/2012  FINDINGS: The lungs are mildly hypoexpanded. Mild left basilar airspace opacity may reflect atelectasis or possibly mild pneumonia. Mild vascular crowding is noted. There is no evidence of pleural effusion or pneumothorax.  The cardiomediastinal silhouette is within normal limits. No acute osseous abnormalities are seen.  IMPRESSION: Lungs mildly hypoexpanded. Mild left basilar airspace opacity may reflect atelectasis or possibly mild pneumonia.   Electronically Signed  By: Roanna Raider M.D.   On: 12/15/2013 22:18     Assessment/Plan Principal Problem:   CAP (community acquired pneumonia) Active Problems:   Multiple myeloma in relapse   Benign essential HTN   CRI (chronic renal insufficiency)   DM II (diabetes mellitus, type II), controlled   Pneumonia   1. Pneumonia - we'll treat this as community-acquired pneumonia with ceftriaxone and Zithromax. Check influenza PCR until then patient will be placed on droplet precautions. Patient has pleuritic type of chest pain probably from pneumonia but we will check EKG and one set of troponin. Patient also complains of weakness could be from his Parkinson's. Check TSH and CK levels. 2. Chronic kidney disease - since patient's lactic acid is elevated I have held Lasix for now. Closely follow intake output and metabolic panel. 3. Pancytopenia probably from chemotherapy and multiple myeloma - closely follow CBC with differentials presently patient is not neutropenic. 4. Diabetes mellitus type 2 - continue home medications with close followup of CBGs. 5. Hyperlipidemia -  continue present medications. Since patient complains of weakness will check CK levels.    Code Status: Full code.  Family Communication: None.  Disposition Plan: Admit to inpatient under Dr. Nehemiah Settle.    Malikai Gut N. [facility] Pager [phone].  If 7PM-7AM, please contact night-coverage [email or web address] Password [facility] 12/16/2013, 3:34 AM

## 2013-12-16 NOTE — Progress Notes (Signed)
Per M. Lynch NP, no new orders for elevated temperature at this time. Give PRN tylenol at next PRN scheduled dose. Gilman Schmidt

## 2013-12-16 NOTE — ED Provider Notes (Signed)
72 year old male with history of multiple myeloma comes in with a two-week history of cough with some dyspnea. Denies fever, chills, sweats. He is not hurting anywhere. On exam, he does appear dyspneic. Lungs have scattered rales mainly in the midlung fields with a scattered wheezes as well. He has 1+ edema. Chest x-ray was reviewed by me and appears to show a retrocardiac infiltrate with air bronchograms consistent with pneumonia. He was initially tachycardic which improved with IV fluids. His temperature was just below the threshold for fever. Lactic acid was mildly elevated. He was mentating well she no evidence of endorgan damage. Therefore, it was felt that he did not meet criteria for a code sepsis. He was started on antibiotics for community-acquired pneumonia and admitted to the medicine service.  CRITICAL CARE Performed by: Dione Booze Total critical care time: 40 minutes Critical care time was exclusive of separately billable procedures and treating other patients. Critical care was necessary to treat or prevent imminent or life-threatening deterioration. Critical care was time spent personally by me on the following activities: development of treatment plan with patient and/or surrogate as well as nursing, discussions with consultants, evaluation of patient's response to treatment, examination of patient, obtaining history from patient or surrogate, ordering and performing treatments and interventions, ordering and review of laboratory studies, ordering and review of radiographic studies, pulse oximetry and re-evaluation of patient's condition.  I saw and evaluated the patient, reviewed the resident's note and I agree with the findings and plan.     Dione Booze, MD 12/16/13 [phone]

## 2013-12-16 NOTE — ED Notes (Signed)
Report to [facility] on 6 E.

## 2013-12-16 NOTE — Progress Notes (Signed)
Pt has elevated temperature of 101.5. Pt is resting with eyes closed at this time, without pain or distress. Unable to give tylenol at this time. Daphane Shepherd NP notified. Awaiting new orders.

## 2013-12-16 NOTE — Progress Notes (Signed)
PHARMACIST - PHYSICIAN COMMUNICATION DR: Earl Gala CONCERNING:  Oseltamivir shortage  DESCRIPTION:  This patient has an order for Oseltamivir (Tamiflu) and has an Estimated Creatinine Clearance: 53.9 ml/min (by C-G formula based on Cr of 1.53).  The package insert for Oseltamivir recommends a dosage of 30mg  BID x 5 days for patients with CrCl 30-60 ml/min.  [place] is experiencing a significant shortage of Oseltamivir 30mg  capsules.    To preserve an adequate supply of 30mg  capsules for our patients with more significant renal impairment the Infectious Diseases team has recommended to use Oseltamivir 75mg  qday x 5 days for patients with CrCl 30-60 ml/min.    RECOMMENDATION: Oseltamivir 75mg  PO qday x 5 days has been substituted for your patient. If you have any questions about this temporary substitution please feel free to call the Pharmacy at [phone] for assistance.  Estella Husk, Pharm.D., BCPS, AAHIVP Clinical Pharmacist Phone: [phone] or [phone] Pager: [phone] 12/16/2013, 2:39 PM

## 2013-12-17 ENCOUNTER — Inpatient Hospital Stay (HOSPITAL_COMMUNITY): Payer: Medicare Other

## 2013-12-17 DIAGNOSIS — J1100 Influenza due to unidentified influenza virus with unspecified type of pneumonia: Principal | ICD-10-CM | POA: Diagnosis present

## 2013-12-17 LAB — GLUCOSE, CAPILLARY
Glucose-Capillary: 128 mg/dL — ABNORMAL HIGH (ref 70–99)
Glucose-Capillary: 152 mg/dL — ABNORMAL HIGH (ref 70–99)

## 2013-12-17 LAB — URINE CULTURE: Colony Count: 15000

## 2013-12-17 MED ORDER — ALBUTEROL SULFATE (5 MG/ML) 0.5% IN NEBU
INHALATION_SOLUTION | RESPIRATORY_TRACT | Status: AC
  Administered 2013-12-17: 2.5 mg
  Filled 2013-12-17: qty 0.5

## 2013-12-17 MED ORDER — OSELTAMIVIR PHOSPHATE 75 MG PO CAPS
75.0000 mg | ORAL_CAPSULE | Freq: Two times a day (BID) | ORAL | Status: AC
  Administered 2013-12-17 – 2013-12-20 (×8): 75 mg via ORAL
  Filled 2013-12-17 (×8): qty 1

## 2013-12-17 MED ORDER — ALBUTEROL SULFATE (5 MG/ML) 0.5% IN NEBU
2.5000 mg | INHALATION_SOLUTION | Freq: Four times a day (QID) | RESPIRATORY_TRACT | Status: DC | PRN
  Administered 2013-12-18: 2.5 mg via RESPIRATORY_TRACT
  Filled 2013-12-17: qty 0.5

## 2013-12-17 NOTE — Progress Notes (Signed)
Assessment/Plan: Principal Problem:   CAP (community acquired pneumonia) - he seems to be a bit worse. He is not getting IVF so doubt this is volume overload. Will check CXR for worsening of pneumonia. Continue abx. Did test positive for flu and is on Tamiflu Active Problems:   Multiple myeloma in relapse   Benign essential HTN   CRI (chronic renal insufficiency)   DM II (diabetes mellitus, type II), controlled - CBGs acceptable.    Parkinson disease   Influenza with pneumonia   Subjective: Worsening congestion overnight. Had to start oxygen but easily oxygenated with 2 LPM. No chest pain. Myalgias better.   Objective:  Vital Signs: Filed Vitals:   12/16/13 2242 12/17/13 0525 12/17/13 0528 12/17/13 0900  BP:   103/60 120/64  Pulse:   96 99  Temp: 101.5 F (38.6 C)  100.1 F (37.8 C) 99.7 F (37.6 C)  TempSrc:  Oral Oral Oral  Resp:   20 22  Height:      Weight:      SpO2:   100% 94%     EXAM: Diffuse rhonchi that change with cough    Intake/Output Summary (Last 24 hours) at 12/17/13 1024 Last data filed at 12/17/13 0901  Gross per 24 hour  Intake   1260 ml  Output    126 ml  Net   1134 ml    Lab Results:  Recent Labs  12/15/13 2159 12/16/13 1350  NA 141 138  K 3.2* 3.6  CL 100 104  CO2 30 24  GLUCOSE 218* 192*  BUN 11 10  CREATININE 1.53* 1.28  CALCIUM 8.9 7.7*    Recent Labs  12/15/13 2159 12/16/13 1350  AST 26 26  ALT 19 <5  ALKPHOS 153* 109  BILITOT 0.8 0.5  PROT 6.4 5.1*  ALBUMIN 3.8 2.9*   No results found for this basename: LIPASE, AMYLASE,  in the last 72 hours  Recent Labs  12/15/13 2159 12/16/13 1350 12/16/13 1606  WBC 3.9* 4.0  --   NEUTROABS 3.3 3.4  --   HGB 12.9* 11.6*  --   HCT 37.2* 32.9*  --   MCV 94.7 92.9  --   PLT 80* PLATELET CLUMPS NOTED ON SMEAR, COUNT APPEARS DECREASED 82*    Recent Labs  12/16/13 1350  TROPONINI <0.30   BNP No results found for this basename: probnp   No results found for this  basename: DDIMER,  in the last 72 hours No results found for this basename: HGBA1C,  in the last 72 hours No results found for this basename: CHOL, HDL, LDLCALC, TRIG, CHOLHDL, LDLDIRECT,  in the last 72 hours  Recent Labs  12/16/13 1350  TSH 0.252*   No results found for this basename: VITAMINB12, FOLATE, FERRITIN, TIBC, IRON, RETICCTPCT,  in the last 72 hours  Studies/Results: Dg Chest Port 1 View  12/15/2013   CLINICAL DATA:  Cough, fever and weakness.  EXAM: PORTABLE CHEST - 1 VIEW  COMPARISON:  Chest radiograph performed 03/08/2012  FINDINGS: The lungs are mildly hypoexpanded. Mild left basilar airspace opacity may reflect atelectasis or possibly mild pneumonia. Mild vascular crowding is noted. There is no evidence of pleural effusion or pneumothorax.  The cardiomediastinal silhouette is within normal limits. No acute osseous abnormalities are seen.  IMPRESSION: Lungs mildly hypoexpanded. Mild left basilar airspace opacity may reflect atelectasis or possibly mild pneumonia.   Electronically Signed   By: Roanna Raider M.D.   On: 12/15/2013 22:18   Medications: Medications  administered in the last 24 hours reviewed.  Current Medication List reviewed.    LOS: 2 days   [facility] Internal Medicine @ [facility] [phone]) 12/17/2013, 10:24 AM

## 2013-12-17 NOTE — Plan of Care (Signed)
Problem: Consults Goal: Skin Care Protocol Initiated - if Braden Score 18 or less If consults are not indicated, leave blank or document N/A  Outcome: Completed/Met Date Met:  12/17/13 Incontinent cleanser used.

## 2013-12-17 NOTE — Progress Notes (Signed)
Pharmacy: Re- Tamiflu  Patient's Scr decreased from 1.53 to 1.28 on 12/26 (est crcl~64).  Will adjust tamiflu dose to 75mg  BID for renal function.  Dorna Leitz, PharmD, BCPS

## 2013-12-17 NOTE — Progress Notes (Signed)
Pt with increased congestion this morning. MD ordered CXR. Results called to MD. Orders received for albuterol nebs q 6 hours prn. First dose now. Continue around the clock if pt symptoms improve. Pt reported improvement after first dose. Continue to monitor. Hortencia Conradi, RN

## 2013-12-18 ENCOUNTER — Other Ambulatory Visit: Payer: Self-pay | Admitting: Neurology

## 2013-12-18 LAB — GLUCOSE, CAPILLARY
Glucose-Capillary: 144 mg/dL — ABNORMAL HIGH (ref 70–99)
Glucose-Capillary: 131 mg/dL — ABNORMAL HIGH (ref 70–99)
Glucose-Capillary: 184 mg/dL — ABNORMAL HIGH (ref 70–99)
Glucose-Capillary: 238 mg/dL — ABNORMAL HIGH (ref 70–99)

## 2013-12-18 MED ORDER — ALBUTEROL SULFATE (5 MG/ML) 0.5% IN NEBU: 2.5000 mg | INHALATION_SOLUTION | Freq: Four times a day (QID) | RESPIRATORY_TRACT | Status: DC

## 2013-12-18 MED ORDER — PREDNISONE 20 MG PO TABS
40.0000 mg | ORAL_TABLET | Freq: Every day | ORAL | Status: DC
  Administered 2013-12-18 – 2013-12-19 (×2): 40 mg via ORAL
  Filled 2013-12-18 (×4): qty 2

## 2013-12-18 NOTE — Progress Notes (Signed)
Assessment/Plan: Principal Problem:   CAP (community acquired pneumonia) - CXR yesterday no worse. Will continue current therapy with addition of prednisone (see below) Active Problems:   Multiple myeloma in relapse   Benign essential HTN   CRI (chronic renal insufficiency)   DM II (diabetes mellitus, type II), controlled   Parkinson disease   Influenza with pneumonia - see comments under CAP   Bronchospasm - this was better with albuterol but pharmacy called with word of a potentially severe reaction between Azilect and nebs. Will have to stop nebs. Will use prednisone instead in short course to reduce bronchospasm.   Subjective: Breathing about same but RN notes less congestion after nebs.   Objective:  Vital Signs: Filed Vitals:   12/17/13 1435 12/17/13 1800 12/17/13 2200 12/18/13 0500  BP: 112/60 112/67 118/72 161/72  Pulse: 106 98 82 94  Temp: 99.2 F (37.3 C) 98.3 F (36.8 C) 98.2 F (36.8 C) 99.5 F (37.5 C)  TempSrc: Oral Oral Oral Oral  Resp: 20 20 20 20   Height:      Weight:      SpO2: 99% 97% 100% 95%     EXAM: LUNGS: scattered rhonchi.    Intake/Output Summary (Last 24 hours) at 12/18/13 0844 Last data filed at 12/17/13 1900  Gross per 24 hour  Intake    723 ml  Output      0 ml  Net    723 ml    Lab Results:  Recent Labs  12/15/13 2159 12/16/13 1350  NA 141 138  K 3.2* 3.6  CL 100 104  CO2 30 24  GLUCOSE 218* 192*  BUN 11 10  CREATININE 1.53* 1.28  CALCIUM 8.9 7.7*    Recent Labs  12/15/13 2159 12/16/13 1350  AST 26 26  ALT 19 <5  ALKPHOS 153* 109  BILITOT 0.8 0.5  PROT 6.4 5.1*  ALBUMIN 3.8 2.9*   No results found for this basename: LIPASE, AMYLASE,  in the last 72 hours  Recent Labs  12/15/13 2159 12/16/13 1350 12/16/13 1606  WBC 3.9* 4.0  --   NEUTROABS 3.3 3.4  --   HGB 12.9* 11.6*  --   HCT 37.2* 32.9*  --   MCV 94.7 92.9  --   PLT 80* PLATELET CLUMPS NOTED ON SMEAR, COUNT APPEARS DECREASED 82*    Recent Labs  12/16/13 1350  TROPONINI <0.30   BNP No results found for this basename: probnp   No results found for this basename: DDIMER,  in the last 72 hours No results found for this basename: HGBA1C,  in the last 72 hours No results found for this basename: CHOL, HDL, LDLCALC, TRIG, CHOLHDL, LDLDIRECT,  in the last 72 hours  Recent Labs  12/16/13 1350  TSH 0.252*   No results found for this basename: VITAMINB12, FOLATE, FERRITIN, TIBC, IRON, RETICCTPCT,  in the last 72 hours  Studies/Results: Dg Chest Port 1 View  12/17/2013   CLINICAL DATA:  Worsening respiratory status which shortness of breath.  EXAM: PORTABLE CHEST - 1 VIEW  COMPARISON:  Portable examination 12/15/2013. Two-view chest x-ray 03/08/2012.  FINDINGS: Suboptimal inspiration accounts for crowded bronchovascular markings, especially in the bases, and accentuates the cardiac silhouette. Taking this into account, cardiac silhouette mildly enlarged but stable. Stable atelectasis at the left lung base. Lungs remain clear otherwise. Pulmonary vascularity normal. No visible pleural effusions.  IMPRESSION: Stable atelectasis at the left lung base, likely related to suboptimal inspiration. No new abnormality since the examination  2 days ago.   Electronically Signed   By: Hulan Saas M.D.   On: 12/17/2013 15:39   Medications: Medications administered in the last 24 hours reviewed.  Current Medication List reviewed.    LOS: 3 days   [facility] Internal Medicine @ [facility] [phone]) 12/18/2013, 8:44 AM

## 2013-12-19 ENCOUNTER — Telehealth: Payer: Self-pay | Admitting: *Deleted

## 2013-12-19 LAB — GLUCOSE, CAPILLARY
Glucose-Capillary: 150 mg/dL — ABNORMAL HIGH (ref 70–99)
Glucose-Capillary: 295 mg/dL — ABNORMAL HIGH (ref 70–99)

## 2013-12-19 MED ORDER — LEVALBUTEROL HCL 0.63 MG/3ML IN NEBU
0.6300 mg | INHALATION_SOLUTION | RESPIRATORY_TRACT | Status: DC | PRN
  Administered 2013-12-19 – 2013-12-20 (×2): 0.63 mg via RESPIRATORY_TRACT
  Filled 2013-12-19 (×2): qty 3

## 2013-12-19 NOTE — Telephone Encounter (Signed)
Received vm from pt's wife stating that pt is at [facility] with pneumonia since 12/15/13 & not sure when he will be out.  He has chemo scheduled for 12/20/13 & 12/11/13 & doesn't think he will be able to received.  Note to Dr Cyndie Chime & infusion room informed

## 2013-12-19 NOTE — Progress Notes (Signed)
Subjective: Patient still with complaint of cough, occasional productive. Denies fever or chills. Apparently albuterol treatment was DC'd because of concern for reaction with Azilect however patient still with significant bronchospasm.  Objective: Vital signs in last 24 hours: Temp:  [97.7 F (36.5 C)-98.7 F (37.1 C)] 97.7 F (36.5 C) (12/29 1610) Pulse Rate:  [65-99] 65 (12/29 0608) Resp:  [18-24] 24 (12/29 9604) BP: (97-116)/(63-76) 111/76 mmHg (12/29 0608) SpO2:  [95 %-100 %] 100 % (12/29 5409) Weight change:  Last BM Date: 12/16/13  Intake/Output from previous day: 12/28 0701 - 12/29 0700 In: 130 [P.O.:130] Out: 300 [Urine:300] Intake/Output this shift:    General appearance: Somewhat slow mentation probably related to his Parkinson's Resp: Bilateral rhonchi expiratory wheeze Cardio: regular rate and rhythm, S1, S2 normal, no murmur, click, rub or gallop Extremities: extremities normal, atraumatic, no cyanosis or edema  Lab Results:  Results for orders placed during the hospital encounter of 12/15/13 (from the past 24 hour(s))  GLUCOSE, CAPILLARY     Status: Abnormal   Collection Time    12/18/13 11:17 AM      Result Value Range   Glucose-Capillary 131 (*) 70 - 99 mg/dL  GLUCOSE, CAPILLARY     Status: Abnormal   Collection Time    12/18/13  4:45 PM      Result Value Range   Glucose-Capillary 184 (*) 70 - 99 mg/dL  GLUCOSE, CAPILLARY     Status: Abnormal   Collection Time    12/18/13  9:47 PM      Result Value Range   Glucose-Capillary 238 (*) 70 - 99 mg/dL      Studies/Results: Dg Chest Port 1 View  12/17/2013   CLINICAL DATA:  Worsening respiratory status which shortness of breath.  EXAM: PORTABLE CHEST - 1 VIEW  COMPARISON:  Portable examination 12/15/2013. Two-view chest x-ray 03/08/2012.  FINDINGS: Suboptimal inspiration accounts for crowded bronchovascular markings, especially in the bases, and accentuates the cardiac silhouette. Taking this into  account, cardiac silhouette mildly enlarged but stable. Stable atelectasis at the left lung base. Lungs remain clear otherwise. Pulmonary vascularity normal. No visible pleural effusions.  IMPRESSION: Stable atelectasis at the left lung base, likely related to suboptimal inspiration. No new abnormality since the examination 2 days ago.   Electronically Signed   By: Hulan Saas M.D.   On: 12/17/2013 15:39    Medications:  Prior to Admission:  Prescriptions prior to admission  Medication Sig Dispense Refill  . acetaminophen (TYLENOL) 500 MG tablet Take 500 mg by mouth every 6 (six) hours as needed. For pain.      Marland Kitchen aspirin EC 81 MG tablet Take 81 mg by mouth daily after breakfast.       . atorvastatin (LIPITOR) 10 MG tablet Take 10 mg by mouth at bedtime.        . BD ULTRA-FINE PEN NEEDLES 29G X 12.7MM MISC Inject 30 Units into the skin daily.       . carbidopa-levodopa (SINEMET IR) 25-100 MG per tablet TAKE 1 TABLET THREE TIMES A DAY  90 tablet  3  . furosemide (LASIX) 20 MG tablet TAKE 1 TABLET (20 MG TOTAL) BY MOUTH DAILY.  60 tablet  1  . Insulin Glargine (LANTUS SOLOSTAR [place]) Inject 30 Units into the skin every evening.       . ondansetron (ZOFRAN ODT) 8 MG disintegrating tablet Take 1 tablet (8 mg total) by mouth every 8 (eight) hours as needed for nausea.  30 tablet  prn  . oxyCODONE (OXY IR/ROXICODONE) 5 MG immediate release tablet Take 5-10 mg by mouth every 4 (four) hours as needed. For pain.      Marland Kitchen oxyCODONE (OXYCONTIN) 20 MG 12 hr tablet Take 20 mg by mouth every 12 (twelve) hours as needed. For pain.      . polyethylene glycol (MIRALAX / GLYCOLAX) packet Take 17 g by mouth daily after breakfast.       . predniSONE (DELTASONE) 20 MG tablet Take 4 tablets = 80mg  with food for 4 days, starting day after chemotherapy.  100 tablet  0  . pregabalin (LYRICA) 100 MG capsule Take 1 capsule (100 mg total) by mouth daily after breakfast.  90 capsule  1  . PRESCRIPTION MEDICATION Pt gets chemo  at [facility]. Last treatment was on 11-30- 31. Pt takes 2 days in row once monthly. Followed by Dr Cyndie Chime, next treatment is scheduled for Dec 30 and 31.      . rasagiline (AZILECT) 1 MG TABS tablet Take 1 tablet (1 mg total) by mouth daily.  30 tablet  0   Scheduled: . antiseptic oral rinse  15 mL Mouth Rinse BID  . aspirin EC  81 mg Oral QPC breakfast  . atorvastatin  10 mg Oral QHS  . azithromycin  500 mg Intravenous Q24H  . carbidopa-levodopa  1 tablet Oral TID  . cefTRIAXone (ROCEPHIN)  IV  1 g Intravenous Q24H  . insulin aspart  0-9 Units Subcutaneous TID WC  . oseltamivir  75 mg Oral BID  . polyethylene glycol  17 g Oral QPC breakfast  . predniSONE  40 mg Oral Q breakfast  . pregabalin  100 mg Oral QPC breakfast  . rasagiline  1 mg Oral Daily  . sodium chloride  3 mL Intravenous Q12H   Continuous:  ZOX:[id number], acetaminophen, ondansetron (ZOFRAN) IV, ondansetron, ondansetron, oxyCODONE, OxyCODONE  Assessment/Plan: Influenza A. with pneumonia. Patient with slow recovery. Continue antibiotics, pulmonary toilet. Discuss with pharmacy alternative nebulized treatment. 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

## 2013-12-20 ENCOUNTER — Other Ambulatory Visit: Payer: Medicare Other

## 2013-12-20 ENCOUNTER — Ambulatory Visit: Payer: BC Managed Care – HMO

## 2013-12-20 LAB — GLUCOSE, CAPILLARY
Glucose-Capillary: 212 mg/dL — ABNORMAL HIGH (ref 70–99)
Glucose-Capillary: 148 mg/dL — ABNORMAL HIGH (ref 70–99)
Glucose-Capillary: 141 mg/dL — ABNORMAL HIGH (ref 70–99)
Glucose-Capillary: 142 mg/dL — ABNORMAL HIGH (ref 70–99)

## 2013-12-20 MED ORDER — LEVOFLOXACIN 750 MG PO TABS
750.0000 mg | ORAL_TABLET | Freq: Every day | ORAL | Status: DC
  Administered 2013-12-20 – 2013-12-21 (×2): 750 mg via ORAL
  Filled 2013-12-20 (×2): qty 1

## 2013-12-20 MED ORDER — PREDNISONE 20 MG PO TABS
20.0000 mg | ORAL_TABLET | Freq: Every day | ORAL | Status: DC
  Administered 2013-12-21: 20 mg via ORAL
  Filled 2013-12-20 (×2): qty 1

## 2013-12-20 NOTE — Progress Notes (Signed)
Utilization review completed.  

## 2013-12-20 NOTE — Telephone Encounter (Signed)
Patient was last seen 08/2012.  I called, spoke with [name] [name].  She said he is not feeling well right now, but they will call back to schedule appt when he is better.  Told her we will send one refill now and when appt is made, we will refill meds until he is able to be seen.

## 2013-12-20 NOTE — Progress Notes (Signed)
Subjective: No new problems overnight, less wheezing. Per nurse just elevated BS  Objective: Vital signs in last 24 hours: Temp:  [97.4 F (36.3 C)-98.4 F (36.9 C)] 97.7 F (36.5 C) (12/30 0615) Pulse Rate:  [52-82] 52 (12/30 0615) Resp:  [18-22] 18 (12/30 0615) BP: (114-138)/(69-82) 138/72 mmHg (12/30 0615) SpO2:  [97 %-100 %] 99 % (12/30 0615) Weight:  [98.884 kg (218 lb)] 98.884 kg (218 lb) (12/29 2050) Weight change:  Last BM Date: 12/16/13  Intake/Output from previous day: 12/29 0701 - 12/30 0700 In: 240 [P.O.:240] Out: 425 [Urine:425] Intake/Output this shift: Total I/O In: -  Out: 200 [Urine:200]  General appearance: alert and cooperative Resp: moderate air movt less exp wheeze Cardio: regular rate and rhythm, S1, S2 normal, no murmur, click, rub or gallop Extremities: pas hose in place  Lab Results:  Results for orders placed during the hospital encounter of 12/15/13 (from the past 24 hour(s))  GLUCOSE, CAPILLARY     Status: Abnormal   Collection Time    12/19/13 11:44 AM      Result Value Range   Glucose-Capillary 150 (*) 70 - 99 mg/dL  GLUCOSE, CAPILLARY     Status: Abnormal   Collection Time    12/19/13  5:15 PM      Result Value Range   Glucose-Capillary 295 (*) 70 - 99 mg/dL  GLUCOSE, CAPILLARY     Status: Abnormal   Collection Time    12/19/13  8:41 PM      Result Value Range   Glucose-Capillary 315 (*) 70 - 99 mg/dL      Studies/Results: No results found.  Medications:  Prior to Admission:  Prescriptions prior to admission  Medication Sig Dispense Refill  . acetaminophen (TYLENOL) 500 MG tablet Take 500 mg by mouth every 6 (six) hours as needed. For pain.      Marland Kitchen aspirin EC 81 MG tablet Take 81 mg by mouth daily after breakfast.       . atorvastatin (LIPITOR) 10 MG tablet Take 10 mg by mouth at bedtime.        . BD ULTRA-FINE PEN NEEDLES 29G X 12.7MM MISC Inject 30 Units into the skin daily.       . carbidopa-levodopa (SINEMET IR) 25-100  MG per tablet TAKE 1 TABLET THREE TIMES A DAY  90 tablet  3  . furosemide (LASIX) 20 MG tablet TAKE 1 TABLET (20 MG TOTAL) BY MOUTH DAILY.  60 tablet  1  . Insulin Glargine (LANTUS SOLOSTAR [place]) Inject 30 Units into the skin every evening.       . ondansetron (ZOFRAN ODT) 8 MG disintegrating tablet Take 1 tablet (8 mg total) by mouth every 8 (eight) hours as needed for nausea.  30 tablet  prn  . oxyCODONE (OXY IR/ROXICODONE) 5 MG immediate release tablet Take 5-10 mg by mouth every 4 (four) hours as needed. For pain.      Marland Kitchen oxyCODONE (OXYCONTIN) 20 MG 12 hr tablet Take 20 mg by mouth every 12 (twelve) hours as needed. For pain.      . polyethylene glycol (MIRALAX / GLYCOLAX) packet Take 17 g by mouth daily after breakfast.       . predniSONE (DELTASONE) 20 MG tablet Take 4 tablets = 80mg  with food for 4 days, starting day after chemotherapy.  100 tablet  0  . pregabalin (LYRICA) 100 MG capsule Take 1 capsule (100 mg total) by mouth daily after breakfast.  90 capsule  1  . PRESCRIPTION  MEDICATION Pt gets chemo at [facility]. Last treatment was on 11-30- 31. Pt takes 2 days in row once monthly. Followed by Dr Cyndie Chime, next treatment is scheduled for Dec 30 and 31.      . rasagiline (AZILECT) 1 MG TABS tablet Take 1 tablet (1 mg total) by mouth daily.  30 tablet  0   Scheduled: . antiseptic oral rinse  15 mL Mouth Rinse BID  . aspirin EC  81 mg Oral QPC breakfast  . atorvastatin  10 mg Oral QHS  . azithromycin  500 mg Intravenous Q24H  . carbidopa-levodopa  1 tablet Oral TID  . cefTRIAXone (ROCEPHIN)  IV  1 g Intravenous Q24H  . insulin aspart  0-9 Units Subcutaneous TID WC  . oseltamivir  75 mg Oral BID  . polyethylene glycol  17 g Oral QPC breakfast  . predniSONE  40 mg Oral Q breakfast  . pregabalin  100 mg Oral QPC breakfast  . rasagiline  1 mg Oral Daily  . sodium chloride  3 mL Intravenous Q12H   Continuous:  ZOX:[id number], acetaminophen, levalbuterol, ondansetron (ZOFRAN) IV,  ondansetron, ondansetron, oxyCODONE, OxyCODONE  Assessment/Plan: Influenza A. with pneumonia. Patient with slow recovery. Continue antibiotics, pulmonary toilet. continue nebulized treatment. 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

## 2013-12-20 NOTE — Progress Notes (Signed)
   CARE MANAGEMENT NOTE 12/20/2013  Patient:  [name],[name]   Account Number:  [id number]  Date Initiated:  12/20/2013  Documentation initiated by:  Darlyne Russian  Subjective/Objective Assessment:   admitted with increasing weakness, nonproductive cough, mild fever, PNA and Flu +  medical hx: mutiple myeloma, chemotherapy, Parkinson's disease  lives at home with spouse     Action/Plan:   progression of care and discharge planning  PT eval   Anticipated DC Date:  12/23/2013   Anticipated DC Plan:  HOME W HOME HEALTH SERVICES         Choice offered to / List presented to:             Status of service:  In process, will continue to follow Medicare Important Message given?   (If response is "NO", the following Medicare IM given date fields will be blank) Date Medicare IM given:   Date Additional Medicare IM given:    Discharge Disposition:    Per UR Regulation:    If discussed at Long Length of Stay Meetings, dates discussed:    Comments:

## 2013-12-20 NOTE — Evaluation (Signed)
Physical Therapy Evaluation Patient Details Name: [name] [name] MRN: [id number] DOB: [birth year] Today's Date: 12/20/2013 Time: 5784-6962 PT Time Calculation (min): 15 min  PT Assessment / Plan / Recommendation History of Present Illness  [name] [name] is a 72 y.o. male with history of relapsing multiple myeloma, chronic kidney disease, diabetes mellitus, hyperlipidemia and Parkinson disease has been experiencing increasing weakness with nonproductive cough over the last one week. Patient is on chemotherapy for multiple myeloma and has received Lasix therapy one week ago. Patient denies any fever chills nausea vomiting abdominal pain any headache or focal deficits. Has some pleuritic type of chest pain but denies any shortness of breath. Patient has cough but unable to bring out any phlegm out. In the ER patient was found to be mildly febrile and chest x-ray showing possible infiltrates has been admitted for pneumonia. Pt positive for influenza.   Clinical Impression  Pt adm due to the above. Presents with decreased independence with functional mobility secondary to deficits listed below (see PT problem list). Pt to benefit from skilled PT in acute setting to maximize functional independence with mobility and address deficits listed below. Pt plans to D/C home with wife, reports she can be with him 24/7. Will recommend he ambulate with RW upon acute D/C to reduce risk of falls.      PT Assessment  Patient needs continued PT services    Follow Up Recommendations  Home health PT;Supervision/Assistance - 24 hour    Does the patient have the potential to tolerate intense rehabilitation      Barriers to Discharge        Equipment Recommendations  None recommended by PT    Recommendations for Other Services OT consult   Frequency Min 3X/week    Precautions / Restrictions Precautions Precautions: Fall Precaution Comments: droplet  Restrictions Weight Bearing Restrictions: No   Pertinent  Vitals/Pain No complaints.       Mobility  Bed Mobility Bed Mobility: Not assessed Details for Bed Mobility Assistance: pt sitting  in recliner and returned to recliner  Transfers Transfers: Sit to Stand;Stand to Sit Sit to Stand: With armrests;With upper extremity assist;From chair/3-in-1;4: Min assist Stand to Sit: 4: Min guard;To chair/3-in-1;With upper extremity assist;With armrests Details for Transfer Assistance: pt requires min (A) to maintain balance and to achieve sit to stand transfer; cues for hand placement and sequencing for safety  Ambulation/Gait Ambulation/Gait Assistance: 4: Min assist;4: Min guard Ambulation Distance (Feet): 100 Feet Assistive device: 1 person hand held assist Ambulation/Gait Assistance Details: pt had one LOB and required (A) to maintain balance with ambulating; cues for hand placement and sequencing; pt scissor walks at times and is unsteady; reports he has decreased sensation in his feet at times; encouraged pt to ambulate with RW; pt reaching for UE external support  Gait Pattern: Wide base of support;Scissoring;Decreased stride length Gait velocity: decreased General Gait Details: pt amb on RA and O2 at 96% Stairs: No Wheelchair Mobility Wheelchair Mobility: No         PT Diagnosis: Difficulty walking;Generalized weakness  PT Problem List: Decreased strength;Decreased activity tolerance;Decreased mobility;Decreased balance;Decreased knowledge of use of DME;Decreased safety awareness PT Treatment Interventions: DME instruction;Gait training;Stair training;Functional mobility training;Therapeutic activities;Therapeutic exercise;Balance training;Neuromuscular re-education;Patient/family education     PT Goals(Current goals can be found in the care plan section) Acute Rehab PT Goals Patient Stated Goal: to go home when im ready PT Goal Formulation: With patient Time For Goal Achievement: 01/03/14 Potential to Achieve Goals: Good  Visit  Information  Last PT Received On: 12/20/13 Assistance Needed: +1 History of Present Illness: Pt is a 72 y.o. male adm       Prior Functioning  Home Living Family/patient expects to be discharged to:: Private residence Living Arrangements: Spouse/significant other Available Help at Discharge: Family;Available 24 hours/day Type of Home: House Home Access: Stairs to enter [employer] of Steps: 4 Entrance Stairs-Rails: Can reach both Home Layout: One level Home Equipment: Walker - 2 wheels;Cane - single point Prior Function Level of Independence: Independent with assistive device(s) Comments: pt reports he can take a shower or bath independently but his wife likes to "check him" to make sure it is correct.   Communication Communication: No difficulties Dominant Hand: Right    Cognition  Cognition Arousal/Alertness: Awake/alert Behavior During Therapy: WFL for tasks assessed/performed Overall Cognitive Status: Within Functional Limits for tasks assessed    Extremity/Trunk Assessment Upper Extremity Assessment Upper Extremity Assessment: Defer to OT evaluation Lower Extremity Assessment Lower Extremity Assessment: Generalized weakness Cervical / Trunk Assessment Cervical / Trunk Assessment: Kyphotic   Balance Balance Balance Assessed: Yes High Level Balance High Level Balance Activites: Direction changes;Turns High Level Balance Comments: required UE support and (A) to balance   End of Session PT - End of Session Equipment Utilized During Treatment: Gait belt Activity Tolerance: Patient tolerated treatment well Patient left: in chair;with call bell/phone within reach;with nursing/sitter in room Nurse Communication: Mobility status;Precautions  GP     Donell Sievert, [place] [phone] 12/20/2013, 12:40 PM

## 2013-12-21 ENCOUNTER — Inpatient Hospital Stay (HOSPITAL_COMMUNITY): Payer: Medicare Other

## 2013-12-21 ENCOUNTER — Ambulatory Visit: Payer: Medicare Other

## 2013-12-21 LAB — GLUCOSE, CAPILLARY
Glucose-Capillary: 162 mg/dL — ABNORMAL HIGH (ref 70–99)
Glucose-Capillary: 110 mg/dL — ABNORMAL HIGH (ref 70–99)

## 2013-12-21 MED ORDER — LEVOFLOXACIN 750 MG PO TABS: 750.0000 mg | ORAL_TABLET | Freq: Every day | ORAL | Status: DC

## 2013-12-21 MED ORDER — PREDNISONE 20 MG PO TABS: 20.0000 mg | ORAL_TABLET | Freq: Every day | ORAL | Status: DC

## 2013-12-21 NOTE — Discharge Summary (Signed)
Physician Discharge Summary  Patient ID: [name] [name] MRN: [id number] DOB/AGE: [birth year] 72 y.o.  Admit date: 12/15/2013 Discharge date: 12/21/2013  Admission Diagnoses: Cough and weakness Discharge Diagnoses:  Principal Problem:   CAP (community acquired pneumonia) Active Problems:   Multiple myeloma in relapse   Benign essential HTN   CRI (chronic renal insufficiency)   DM II (diabetes mellitus, type II), controlled   Parkinson disease   Influenza with pneumonia   Discharged Condition: fair  Hospital Course:  Patient presented to the hospital with the above chief complaint in the emergency room he was evaluated felt to have probable community acquired pneumonia. He was pan cultured, he was found to be positive for influenza A., Tamiflu was added. Patient was continued on antibiotics. Course complicated by expiratory wheeze. Symptoms improved with nebulized treatments and prednisone. Patient's blood sugars did elevate with prednisone. Due to patient's underlying Parkinson's, his other comorbidities and his age his recovery was slow. At this time is followup chest x-ray is negative for pneumonia he will continue 3 more days of antibiotics, he has completed his course of Tamiflu. Physical therapy is still wanted, patient preferred to be discharged home. At this time patient is felt to be medically stable for discharge to home. He will have home health services physical therapy and nursing.  Consults: Treatment Team:  Levert Feinstein, MD  Significant Diagnostic Studies:Dg Chest 2 View  12/21/2013   CLINICAL DATA:  Pneumonia.  EXAM: CHEST  2 VIEW  COMPARISON:  Chest x-ray 12/17/2013.  Chest CT 03/31/2011.  FINDINGS: Mediastinum and hilar structures normal. The lungs are clear of acute infiltrates. Poor inspiration with mild basilar atelectasis. Stable cardiomegaly. No CHF. Degenerative changes thoracic spine. Mild lower thoracic vertebral body compression fractures, unchanged from  prior CT .  IMPRESSION: Poor inspiration with mild basilar atelectasis. Stable cardiomegaly. Chest is stable from prior study .   Electronically Signed   By: Maisie Fus  Register   On: 12/21/2013 09:07   Dg Chest Port 1 View  12/17/2013   CLINICAL DATA:  Worsening respiratory status which shortness of breath.  EXAM: PORTABLE CHEST - 1 VIEW  COMPARISON:  Portable examination 12/15/2013. Two-view chest x-ray 03/08/2012.  FINDINGS: Suboptimal inspiration accounts for crowded bronchovascular markings, especially in the bases, and accentuates the cardiac silhouette. Taking this into account, cardiac silhouette mildly enlarged but stable. Stable atelectasis at the left lung base. Lungs remain clear otherwise. Pulmonary vascularity normal. No visible pleural effusions.  IMPRESSION: Stable atelectasis at the left lung base, likely related to suboptimal inspiration. No new abnormality since the examination 2 days ago.   Electronically Signed   By: Hulan Saas M.D.   On: 12/17/2013 15:39   Dg Chest Port 1 View  12/15/2013   CLINICAL DATA:  Cough, fever and weakness.  EXAM: PORTABLE CHEST - 1 VIEW  COMPARISON:  Chest radiograph performed 03/08/2012  FINDINGS: The lungs are mildly hypoexpanded. Mild left basilar airspace opacity may reflect atelectasis or possibly mild pneumonia. Mild vascular crowding is noted. There is no evidence of pleural effusion or pneumothorax.  The cardiomediastinal silhouette is within normal limits. No acute osseous abnormalities are seen.  IMPRESSION: Lungs mildly hypoexpanded. Mild left basilar airspace opacity may reflect atelectasis or possibly mild pneumonia.   Electronically Signed   By: Roanna Raider M.D.   On: 12/15/2013 22:18      Discharge Exam: Blood pressure 128/80, pulse 87, temperature 97.6 F (36.4 C), temperature source Oral, resp. rate 20, height 6' (1.829 m), weight  99.383 kg (219 lb 1.6 oz), SpO2 96.00%. General appearance: alert and cooperative Resp: moderate  air movement, occasional rhonchi, no expiratory wheeze Cardio: regular rate and rhythm, S1, S2 normal, no murmur, click, rub or gallop Extremities: extremities normal, atraumatic, no cyanosis or edema  Disposition: 06-Home-Health Care Svc   Future Appointments Provider Department Dept Phone   01/13/2014 10:45 AM Rana Snare, NP [facility] [phone]   01/17/2014 9:45 AM [facility] [phone]   02/13/2014 10:00 AM Levert Feinstein, MD [facility] MEDICAL ONCOLOGY [phone]   06/30/2014 1:00 PM [facility] Echo 3 [facility] SITE 3 ECHO LAB [phone]   08/04/2014 1:45 PM Donato Schultz, MD [facility] [place] [phone]       Medication List         acetaminophen 500 MG tablet  Commonly known as:  TYLENOL  Take 500 mg by mouth every 6 (six) hours as needed. For pain.     aspirin EC 81 MG tablet  Take 81 mg by mouth daily after breakfast.     atorvastatin 10 MG tablet  Commonly known as:  LIPITOR  Take 10 mg by mouth at bedtime.     BD ULTRA-FINE PEN NEEDLES 29G X 12.7MM Misc  Generic drug:  Insulin Pen Needle  Inject 30 Units into the skin daily.     carbidopa-levodopa 25-100 MG per tablet  Commonly known as:  SINEMET IR  TAKE 1 TABLET THREE TIMES A DAY     furosemide 20 MG tablet  Commonly known as:  LASIX  TAKE 1 TABLET (20 MG TOTAL) BY MOUTH DAILY.     LANTUS SOLOSTAR [place]  Inject 30 Units into the skin every evening.     levofloxacin 750 MG tablet  Commonly known as:  LEVAQUIN  Take 1 tablet (750 mg total) by mouth daily.     ondansetron 8 MG disintegrating tablet  Commonly known as:  ZOFRAN ODT  Take 1 tablet (8 mg total) by mouth every 8 (eight) hours as needed for nausea.     oxyCODONE 5 MG immediate release tablet  Commonly known as:  Oxy IR/ROXICODONE  Take 5-10 mg by mouth every 4 (four) hours as needed. For pain.      oxyCODONE 20 MG 12 hr tablet  Commonly known as:  OXYCONTIN  Take 20 mg by mouth every 12 (twelve) hours as needed. For pain.     polyethylene glycol packet  Commonly known as:  MIRALAX / GLYCOLAX  Take 17 g by mouth daily after breakfast.     predniSONE 20 MG tablet  Commonly known as:  DELTASONE  Take 4 tablets = 80mg  with food for 4 days, starting day after chemotherapy.     predniSONE 20 MG tablet  Commonly known as:  DELTASONE  Take 1 tablet (20 mg total) by mouth daily with breakfast.     pregabalin 100 MG capsule  Commonly known as:  LYRICA  Take 1 capsule (100 mg total) by mouth daily after breakfast.     PRESCRIPTION MEDICATION  Pt gets chemo at [facility]. Last treatment was on 11-30- 31. Pt takes 2 days in row once monthly. Followed by Dr Cyndie Chime, next treatment is scheduled for Dec 30 and 31.     rasagiline 1 MG Tabs tablet  Commonly known as:  AZILECT  Take 1 tablet (1 mg total) by mouth daily.  Follow-up Information   Follow up with Jalik Gellatly D, MD In 1 week.   Specialty:  Internal Medicine   Contact information:   [address] [place] [address] [phone]       Signed: Katy Apo 12/21/2013, 1:57 PM

## 2013-12-21 NOTE — Progress Notes (Signed)
Discussed discharge instructions and medications with pt and pt's wife. Pt showed no barriers to discharge. IV removed. Pt discharged to home with family. Assessment unchanged from morning.

## 2013-12-21 NOTE — Progress Notes (Signed)
Physical Therapy Treatment Patient Details Name: [name] [name] MRN: [id number] DOB: 27-Sep-1941 Today's Date: 12/21/2013 Time: 1121-1140 PT Time Calculation (min): 19 min  PT Assessment / Plan / Recommendation  History of Present Illness admitted for flu and pnuemonia; with history of multiple myeloma, chronic kidney disease, diabetes mellitus, hyperlipidemia and Parkinson disease    PT Comments   Patient did much better with RW today; recommend RW for home use until strength/balance improve.  Also recommend [facility] for toilet transfer.  Feel patient safe for d/c with supervision for mobility and RW.  Will benefit from f/u HHPT to continue with progressive mobility/ambulation to achieve ambulation without RW and return to baseline.  Will continue to follow while in hospital.  Follow Up Recommendations  Home health PT;Supervision for mobility/OOB     Does the patient have the potential to tolerate intense rehabilitation     Barriers to Discharge        Equipment Recommendations  3in1 (PT)    Recommendations for Other Services    Frequency Min 3X/week   Progress towards PT Goals Progress towards PT goals: Progressing toward goals  Plan Current plan remains appropriate    Precautions / Restrictions Precautions Precautions: Fall Precaution Comments: droplet  Restrictions Weight Bearing Restrictions: No   Pertinent Vitals/Pain Denies pain    Mobility  Bed Mobility Details for Bed Mobility Assistance: pt sitting  in recliner and returned to recliner  Transfers Transfers: Sit to Stand;Stand to Sit Sit to Stand: 4: Min guard;With upper extremity assist;From chair/3-in-1 Stand to Sit: 6: Modified independent (Device/Increase time);To chair/3-in-1;With upper extremity assist Details for Transfer Assistance: patient with loss of balance on first attempt sit to stand.   Ambulation/Gait Ambulation/Gait Assistance: 4: Min guard Ambulation Distance (Feet): 75 Feet Assistive device:  Rolling walker Ambulation/Gait Assistance Details: attempted ambulation without RW - required min-mod assist and with several loss of balance during ambulation; with RW - much more steady with no loss of balance. Gait Pattern: Step-through pattern    Exercises General Exercises - Lower Extremity Ankle Circles/Pumps: AROM;Right;10 reps;Limitations;Seated Ankle Circles/Pumps Limitations: unable on Left due to prior surgery Gluteal Sets: AROM;Both;10 reps;Seated Long Arc Quad: AROM;Both;Seated;10 reps   PT Diagnosis:    PT Problem List:   PT Treatment Interventions:     PT Goals (current goals can now be found in the care plan section)    Visit Information  Last PT Received On: 12/21/13 Assistance Needed: +1 History of Present Illness: admitted for flu and pnuemonia; with history of multiple myeloma, chronic kidney disease, diabetes mellitus, hyperlipidemia and Parkinson disease     Subjective Data      Cognition  Cognition Arousal/Alertness: Awake/alert Behavior During Therapy: WFL for tasks assessed/performed Overall Cognitive Status: Within Functional Limits for tasks assessed    Balance     End of Session PT - End of Session Equipment Utilized During Treatment: Gait belt Activity Tolerance: Patient tolerated treatment well Patient left: in chair;with call bell/phone within reach   GP     Olivia Canter, [place] [phone] 12/21/2013, 11:49 AM

## 2013-12-21 NOTE — Progress Notes (Signed)
   CARE MANAGEMENT NOTE 12/21/2013  Patient:  [name] [name],[name] [name]   Account Number:  [id number]  Date Initiated:  12/20/2013  Documentation initiated by:  Darlyne Russian  Subjective/Objective Assessment:   admitted with increasing weakness, nonproductive cough, mild fever, PNA and Flu +  medical hx: mutiple myeloma, chemotherapy, Parkinson's disease  lives at home with spouse     Action/Plan:   progression of care and discharge planning  PT eval   Anticipated DC Date:  12/21/2013   Anticipated DC Plan:  HOME W HOME HEALTH SERVICES      DC Planning Services  CM consult      [facility] Choice  HOME HEALTH   Choice offered to / List presented to:  C-1 Patient        HH arranged  HH-1 RN  HH-2 PT      [facility] agency  [facility]   Status of service:  Completed, signed off Medicare Important Message given?   (If response is "NO", the following Medicare IM given date fields will be blank) Date Medicare IM given:   Date Additional Medicare IM given:    Discharge Disposition:  HOME W HOME HEALTH SERVICES  Per UR Regulation:    If discussed at Long Length of Stay Meetings, dates discussed:    Comments:  12/21/2013  [address] RN, [place]  [phone] CM referral:  home health RN, PT  Met with patient and family regarding discharge planning for home health RN, PT and offer choice. They selected [facility] for home health. [facility] called with referral, left voice mail message.

## 2013-12-21 NOTE — Clinical Documentation Improvement (Signed)
THIS DOCUMENT IS NOT A PERMANENT PART OF THE MEDICAL RECORD  Please update your documentation with the medical record to reflect your response to this query. If you need help knowing how to do this please call [phone].  12/21/13  Dear Dr. Nehemiah Settle Marton Redwood,  In a better effort to capture your patient's severity of illness, reflect appropriate length of stay and utilization of resources, a review of the patient medical record has revealed the following indicators:   Presents with weakness, tachycardic, tachypneic  WBC = 3.9 (possibly d/t multiple myeloma relapse)  Lactic Acid elevated at 3.04   SIRS noted in ED progress note on 12/26  Patient diagnosed with Influenza with Pneumonia   Based on your clinical judgment, please clarify and document in progress note and discharge summary if you believe the patient has: Sepsis                           Sepsis ruled out  In responding to this query please exercise your independent judgment.  The fact that a query is asked, does not imply that any particular answer is desired or expected.    Reviewed:  no additional documentation provided  Thank Lucilla Edin  Clinical Documentation Specialist: [phone] Health Information Management [place]

## 2013-12-21 NOTE — Progress Notes (Signed)
Subjective: Patient w/o new complaint.  He still has a cough with colored mucus and some blood tinged. He remains afebrile with good o2 sats  PT note reviewed,   Objective: Vital signs in last 24 hours: Temp:  [97.8 F (36.6 C)-98.8 F (37.1 C)] 98.8 F (37.1 C) (12/31 0420) Pulse Rate:  [63-80] 63 (12/31 0420) Resp:  [18] 18 (12/31 0420) BP: (121-149)/(74-80) 143/80 mmHg (12/31 0420) SpO2:  [96 %-98 %] 97 % (12/31 0420) Weight:  [99.383 kg (219 lb 1.6 oz)] 99.383 kg (219 lb 1.6 oz) (12/30 2115) Weight change: 0.499 kg (1 lb 1.6 oz) Last BM Date: 12/20/13  Intake/Output from previous day: 12/30 0701 - 12/31 0700 In: 720 [P.O.:720] Out: 202 [Urine:201; Stool:1] Intake/Output this shift:    General appearance: alert and cooperative Resp: scattered rhonchi, minimal if any wheeze Cardio: regular rate and rhythm, S1, S2 normal, no murmur, click, rub or gallop Extremities: extremities normal, atraumatic, no cyanosis or edema  Lab Results:  Results for orders placed during the hospital encounter of 12/15/13 (from the past 24 hour(s))  GLUCOSE, CAPILLARY     Status: Abnormal   Collection Time    12/20/13 11:33 AM      Result Value Range   Glucose-Capillary 141 (*) 70 - 99 mg/dL  GLUCOSE, CAPILLARY     Status: Abnormal   Collection Time    12/20/13  5:07 PM      Result Value Range   Glucose-Capillary 142 (*) 70 - 99 mg/dL  GLUCOSE, CAPILLARY     Status: Abnormal   Collection Time    12/21/13  7:29 AM      Result Value Range   Glucose-Capillary 110 (*) 70 - 99 mg/dL   Comment 1 Documented in Chart        Studies/Results: No results found.  Medications:  Prior to Admission:  Prescriptions prior to admission  Medication Sig Dispense Refill  . acetaminophen (TYLENOL) 500 MG tablet Take 500 mg by mouth every 6 (six) hours as needed. For pain.      Marland Kitchen aspirin EC 81 MG tablet Take 81 mg by mouth daily after breakfast.       . atorvastatin (LIPITOR) 10 MG tablet Take 10 mg  by mouth at bedtime.        . BD ULTRA-FINE PEN NEEDLES 29G X 12.7MM MISC Inject 30 Units into the skin daily.       . furosemide (LASIX) 20 MG tablet TAKE 1 TABLET (20 MG TOTAL) BY MOUTH DAILY.  60 tablet  1  . Insulin Glargine (LANTUS SOLOSTAR [place]) Inject 30 Units into the skin every evening.       . ondansetron (ZOFRAN ODT) 8 MG disintegrating tablet Take 1 tablet (8 mg total) by mouth every 8 (eight) hours as needed for nausea.  30 tablet  prn  . oxyCODONE (OXY IR/ROXICODONE) 5 MG immediate release tablet Take 5-10 mg by mouth every 4 (four) hours as needed. For pain.      Marland Kitchen oxyCODONE (OXYCONTIN) 20 MG 12 hr tablet Take 20 mg by mouth every 12 (twelve) hours as needed. For pain.      . polyethylene glycol (MIRALAX / GLYCOLAX) packet Take 17 g by mouth daily after breakfast.       . predniSONE (DELTASONE) 20 MG tablet Take 4 tablets = 80mg  with food for 4 days, starting day after chemotherapy.  100 tablet  0  . pregabalin (LYRICA) 100 MG capsule Take 1 capsule (100 mg total)  by mouth daily after breakfast.  90 capsule  1  . PRESCRIPTION MEDICATION Pt gets chemo at [facility]. Last treatment was on 11-30- 31. Pt takes 2 days in row once monthly. Followed by Dr Cyndie Chime, next treatment is scheduled for Dec 30 and 31.      . rasagiline (AZILECT) 1 MG TABS tablet Take 1 tablet (1 mg total) by mouth daily.  30 tablet  0   Scheduled: . antiseptic oral rinse  15 mL Mouth Rinse BID  . aspirin EC  81 mg Oral QPC breakfast  . atorvastatin  10 mg Oral QHS  . cefTRIAXone (ROCEPHIN)  IV  1 g Intravenous Q24H  . insulin aspart  0-9 Units Subcutaneous TID WC  . levofloxacin  750 mg Oral Daily  . polyethylene glycol  17 g Oral QPC breakfast  . predniSONE  20 mg Oral Q breakfast  . pregabalin  100 mg Oral QPC breakfast  . rasagiline  1 mg Oral Daily  . sodium chloride  3 mL Intravenous Q12H   Continuous:  WUJ:[id number], acetaminophen, levalbuterol, ondansetron (ZOFRAN) IV, ondansetron, ondansetron,  oxyCODONE, OxyCODONE  Assessment/Plan: Influenza A. with pneumonia. Patient with slow recovery. Continue antibiotics orally, pulmonary toilet. continue nebulized treatment. 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

## 2013-12-22 LAB — CULTURE, BLOOD (ROUTINE X 2)
Culture: NO GROWTH
Culture: NO GROWTH

## 2013-12-23 ENCOUNTER — Other Ambulatory Visit: Payer: Self-pay | Admitting: Oncology

## 2013-12-23 ENCOUNTER — Telehealth: Payer: Self-pay | Admitting: *Deleted

## 2013-12-23 DIAGNOSIS — C9002 Multiple myeloma in relapse: Secondary | ICD-10-CM

## 2013-12-23 NOTE — Telephone Encounter (Signed)
Per POF I have scheduled appts. I have also moved lab appt from 1/27 to 1/23. I have called the wife and gave her the appts

## 2013-12-27 ENCOUNTER — Ambulatory Visit (HOSPITAL_BASED_OUTPATIENT_CLINIC_OR_DEPARTMENT_OTHER): Payer: Medicare Other | Admitting: Nurse Practitioner

## 2013-12-27 ENCOUNTER — Ambulatory Visit: Payer: Medicare Other

## 2013-12-27 ENCOUNTER — Telehealth: Payer: Self-pay | Admitting: Oncology

## 2013-12-27 ENCOUNTER — Other Ambulatory Visit (HOSPITAL_BASED_OUTPATIENT_CLINIC_OR_DEPARTMENT_OTHER): Payer: Medicare Other

## 2013-12-27 VITALS — BP 96/62 | HR 90 | Temp 97.1°F | Resp 18 | Ht 72.0 in | Wt 221.7 lb

## 2013-12-27 DIAGNOSIS — E1149 Type 2 diabetes mellitus with other diabetic neurological complication: Secondary | ICD-10-CM

## 2013-12-27 DIAGNOSIS — C9000 Multiple myeloma not having achieved remission: Secondary | ICD-10-CM

## 2013-12-27 DIAGNOSIS — C9002 Multiple myeloma in relapse: Secondary | ICD-10-CM

## 2013-12-27 DIAGNOSIS — E1142 Type 2 diabetes mellitus with diabetic polyneuropathy: Secondary | ICD-10-CM

## 2013-12-27 DIAGNOSIS — N189 Chronic kidney disease, unspecified: Secondary | ICD-10-CM

## 2013-12-27 LAB — CBC WITH DIFFERENTIAL/PLATELET
BASO%: 0.7 % (ref 0.0–2.0)
Basophils Absolute: 0 10*3/uL (ref 0.0–0.1)
EOS ABS: 0 10*3/uL (ref 0.0–0.5)
EOS%: 0.7 % (ref 0.0–7.0)
HEMATOCRIT: 36.7 % — AB (ref 38.4–49.9)
HGB: 12.8 g/dL — ABNORMAL LOW (ref 13.0–17.1)
LYMPH%: 13.1 % — AB (ref 14.0–49.0)
MCH: 32.1 pg (ref 27.2–33.4)
MCHC: 34.9 g/dL (ref 32.0–36.0)
MCV: 92 fL (ref 79.3–98.0)
MONO#: 0.3 10*3/uL (ref 0.1–0.9)
MONO%: 6 % (ref 0.0–14.0)
NEUT#: 4.4 10*3/uL (ref 1.5–6.5)
NEUT%: 79.5 % — AB (ref 39.0–75.0)
PLATELETS: 123 10*3/uL — AB (ref 140–400)
RBC: 3.99 10*6/uL — ABNORMAL LOW (ref 4.20–5.82)
RDW: 13.9 % (ref 11.0–14.6)
WBC: 5.5 10*3/uL (ref 4.0–10.3)
lymph#: 0.7 10*3/uL — ABNORMAL LOW (ref 0.9–3.3)

## 2013-12-27 NOTE — Progress Notes (Signed)
OFFICE PROGRESS NOTE  Interval history:  [name] [name] returns for followup of multiple myeloma. He is on active treatment with bendamustine/prednisone. He completed cycle 4 beginning 11/15/2013. He was scheduled to begin cycle 5 on 12/20/2013. He was hospitalized at that time with influenza A and possible pneumonia. He was discharged home on 12/21/2013.  He reports a persistent cough particularly at night time. Overall the cough is better during the day. He denies shortness of breath and fever. Energy level is poor. His wife states that he is very "weak". Appetite is poor. He is tolerating fluids well. No nausea or vomiting. Bowels moving regularly. Stable chronic back and side pain.   Objective: Filed Vitals:   12/27/13 1215  BP: 96/62  Pulse: 90  Temp: 97.1 F (36.2 C)  Resp: 18     No thrush or ulcerations. Slight asymmetry of the submandibular glands left more prominent than right. Associated tenderness. Lungs are clear. No wheezes or rales. Regular cardiac rhythm. Abdomen is soft and nontender. No organomegaly. No leg edema. Calves nontender. Motor strength 5 over 5. Knee DTRs absent symmetrically. No skin rash.  Lab Results: Lab Results  Component Value Date   WBC 5.5 12/27/2013   HGB 12.8* 12/27/2013   HCT 36.7* 12/27/2013   MCV 92.0 12/27/2013   PLT 123* 12/27/2013   NEUTROABS 4.4 12/27/2013    Chemistry:    Chemistry      Component Value Date/Time   NA 138 12/16/2013 1350   NA 141 12/02/2013 0915   NA 145 06/13/2009 1503   K 3.6 12/16/2013 1350   K 3.9 12/02/2013 0915   K 4.8* 06/13/2009 1503   CL 104 12/16/2013 1350   CL 104 05/18/2013 1113   CL 108 06/13/2009 1503   CO2 24 12/16/2013 1350   CO2 26 12/02/2013 0915   CO2 29 06/13/2009 1503   BUN 10 12/16/2013 1350   BUN 11.3 12/02/2013 0915   BUN 49* 06/13/2009 1503   CREATININE 1.28 12/16/2013 1350   CREATININE 1.3 12/02/2013 0915   CREATININE 1.33 07/22/2012 1334   CREATININE 2.9* 06/13/2009 1503      Component Value  Date/Time   CALCIUM 7.7* 12/16/2013 1350   CALCIUM 9.4 12/02/2013 0915   CALCIUM 10.5* 06/13/2009 1503   ALKPHOS 109 12/16/2013 1350   ALKPHOS 128 12/02/2013 0915   ALKPHOS 135* 06/13/2009 1503   AST 26 12/16/2013 1350   AST 19 12/02/2013 0915   AST 21 06/13/2009 1503   ALT <5 12/16/2013 1350   ALT <6 12/02/2013 0915   ALT 25 06/13/2009 1503   BILITOT 0.5 12/16/2013 1350   BILITOT 0.73 12/02/2013 0915   BILITOT 0.60 06/13/2009 1503       Studies/Results: Dg Chest 2 View  12/21/2013   CLINICAL DATA:  Pneumonia.  EXAM: CHEST  2 VIEW  COMPARISON:  Chest x-ray 12/17/2013.  Chest CT 03/31/2011.  FINDINGS: Mediastinum and hilar structures normal. The lungs are clear of acute infiltrates. Poor inspiration with mild basilar atelectasis. Stable cardiomegaly. No CHF. Degenerative changes thoracic spine. Mild lower thoracic vertebral body compression fractures, unchanged from prior CT .  IMPRESSION: Poor inspiration with mild basilar atelectasis. Stable cardiomegaly. Chest is stable from prior study .   Electronically Signed   By: Marcello Moores  Register   On: 12/21/2013 09:07   Dg Chest Port 1 View  12/17/2013   CLINICAL DATA:  Worsening respiratory status which shortness of breath.  EXAM: PORTABLE CHEST - 1 VIEW  COMPARISON:  Portable examination  12/15/2013. Two-view chest x-ray 03/08/2012.  FINDINGS: Suboptimal inspiration accounts for crowded bronchovascular markings, especially in the bases, and accentuates the cardiac silhouette. Taking this into account, cardiac silhouette mildly enlarged but stable. Stable atelectasis at the left lung base. Lungs remain clear otherwise. Pulmonary vascularity normal. No visible pleural effusions.  IMPRESSION: Stable atelectasis at the left lung base, likely related to suboptimal inspiration. No new abnormality since the examination 2 days ago.   Electronically Signed   By: Evangeline Dakin M.D.   On: 12/17/2013 15:39   Dg Chest Port 1 View  12/15/2013   CLINICAL DATA:   Cough, fever and weakness.  EXAM: PORTABLE CHEST - 1 VIEW  COMPARISON:  Chest radiograph performed 03/08/2012  FINDINGS: The lungs are mildly hypoexpanded. Mild left basilar airspace opacity may reflect atelectasis or possibly mild pneumonia. Mild vascular crowding is noted. There is no evidence of pleural effusion or pneumothorax.  The cardiomediastinal silhouette is within normal limits. No acute osseous abnormalities are seen.  IMPRESSION: Lungs mildly hypoexpanded. Mild left basilar airspace opacity may reflect atelectasis or possibly mild pneumonia.   Electronically Signed   By: Garald Balding M.D.   On: 12/15/2013 22:18    Medications: I have reviewed the patient's current medications.  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 He was found to have multiple lytic bone lesions. Serum immunoglobulins were all suppressed. He had monoclonal lambda light chains on immunofixation electrophoresis. There was marked elevation of serum free lambda light chains at 3.5 g. Initial urine showed 6.7 g of protein, primarily free lambda light chains. Bone marrow biopsy showed 87% plasma cells. He was induced into a remission with a combination of Velcade plus dexamethasone with a subsequent addition of Revlimid. Velcade was discontinued due to progressive neuropathy. He went on to receive IV melphalan with autologous bone marrow support at [facility] on 03/12/2010. He remained off of treatment until a small amount of monoclonal lambda free light chain was again found in the urine in March 2012. He was started on Revlimid 10 mg daily beginning 04/09/2011. The Revlimid schedule was changed to 21 days on followed by a 7 day break at the same dose of 10 mg in August 2012 due to myelosuppression. Due to myelosuppression the Revlimid dose was decreased to 5 mg daily 3 weeks on/1 week off following an office visit on 03/26/2012. Serum lambda free light chains  returned at 9.98 on 10/19/2012 as compared to 4.85 on 07/20/2012. There was further increase of the lambda free light chains on 11/30/2012 to 25.8. 24-hour urine total protein returned at 1039 mg which was up from baseline of 260 mg. Bone survey 10/19/2012 showed multiple lesions but no new or unstable lesions. Bone marrow aspiration/biopsy done 12/30/2012 showed 10% plasma cells. Pomalidomide cycle 1 initiated on 03/09/2013. He began cycle 2 on 04/06/2013. Serum lambda light chains improved on 04/19/2013. Progressive rise in the lambda free light chains as of 07/26/2013 and rise in 24-hour urine total protein. Treatment initiated with bendamustine/prednisone 08/25/2013. 2. Type 2 diabetes. Exacerbated by steroids. He continues metformin. He is also utilizing insulin. He is followed by Dr. Delfina Redwood. 3. Right lower extremity deep vein thrombosis 05/14/2011. Coumadin anticoagulation discontinued 09/08/2013. 4. 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 He continues Lyrica. 5. Essential hypertension.  6. Parkinson's disease.  7. Recent bronchitis. 8. Hyperlipidemia. 9. Degenerative arthritis. 10. Chronic renal insufficiency.  11. 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 12. Hypotensive when in the office on 09/08/2013. Diovan was discontinued.  4. Hospitalization 12/15/2013 through 12/21/2013 with influenza A, possible pneumonia.   Dispositon-he continues to recover from recent influenza A. He is scheduled to begin cycle 5 of bendamustine today. We hold treatment this week to give him a little longer to recover from the recent illness and reschedule to begin the next cycle in one week. He has a followup visit on 01/13/2014. He will contact the office in the interim with any problems.  Plan reviewed with Dr. Beryle Beams.   Ned Card ANP/GNP-BC

## 2013-12-27 NOTE — Telephone Encounter (Signed)
gv and printed appt sched and avs for pt for Jan and Feb.....sed added tx. °

## 2013-12-28 ENCOUNTER — Ambulatory Visit: Payer: Medicare Other

## 2014-01-02 MED ORDER — CARBIDOPA-LEVODOPA 25-100 MG PO TABS: 1.0000 | ORAL_TABLET | Freq: Three times a day (TID) | ORAL | Status: DC

## 2014-01-02 NOTE — Telephone Encounter (Signed)
Patient's wife called saying patient needs refill of Carbidopa-Levodopa to last him until his appointment on 02/15/14.

## 2014-01-02 NOTE — Telephone Encounter (Signed)
Rx has been sent  

## 2014-01-03 ENCOUNTER — Ambulatory Visit (HOSPITAL_BASED_OUTPATIENT_CLINIC_OR_DEPARTMENT_OTHER): Payer: Medicare Other

## 2014-01-03 ENCOUNTER — Other Ambulatory Visit (HOSPITAL_BASED_OUTPATIENT_CLINIC_OR_DEPARTMENT_OTHER): Payer: Medicare Other

## 2014-01-03 ENCOUNTER — Other Ambulatory Visit: Payer: Self-pay | Admitting: *Deleted

## 2014-01-03 ENCOUNTER — Encounter (INDEPENDENT_AMBULATORY_CARE_PROVIDER_SITE_OTHER): Payer: Self-pay

## 2014-01-03 VITALS — BP 122/63 | HR 88 | Temp 98.3°F | Resp 18

## 2014-01-03 DIAGNOSIS — C9002 Multiple myeloma in relapse: Secondary | ICD-10-CM

## 2014-01-03 DIAGNOSIS — C9000 Multiple myeloma not having achieved remission: Secondary | ICD-10-CM

## 2014-01-03 DIAGNOSIS — Z5112 Encounter for antineoplastic immunotherapy: Secondary | ICD-10-CM

## 2014-01-03 LAB — CBC WITH DIFFERENTIAL/PLATELET
BASO%: 0.8 % (ref 0.0–2.0)
Basophils Absolute: 0 10*3/uL (ref 0.0–0.1)
EOS%: 1.6 % (ref 0.0–7.0)
Eosinophils Absolute: 0.1 10*3/uL (ref 0.0–0.5)
HCT: 33.7 % — ABNORMAL LOW (ref 38.4–49.9)
HGB: 11.6 g/dL — ABNORMAL LOW (ref 13.0–17.1)
LYMPH%: 33.4 % (ref 14.0–49.0)
MCH: 31.8 pg (ref 27.2–33.4)
MCHC: 34.4 g/dL (ref 32.0–36.0)
MCV: 92.3 fL (ref 79.3–98.0)
MONO#: 0.4 10*3/uL (ref 0.1–0.9)
MONO%: 11.1 % (ref 0.0–14.0)
NEUT#: 2 10*3/uL (ref 1.5–6.5)
NEUT%: 53.1 % (ref 39.0–75.0)
PLATELETS: 100 10*3/uL — AB (ref 140–400)
RBC: 3.65 10*6/uL — ABNORMAL LOW (ref 4.20–5.82)
RDW: 14.4 % (ref 11.0–14.6)
WBC: 3.7 10*3/uL — ABNORMAL LOW (ref 4.0–10.3)
lymph#: 1.2 10*3/uL (ref 0.9–3.3)
nRBC: 0 % (ref 0–0)

## 2014-01-03 LAB — COMPREHENSIVE METABOLIC PANEL (CC13)
ALK PHOS: 125 U/L (ref 40–150)
ALT: <6 U/L (ref 0–55)
AST: 16 U/L (ref 5–34)
Albumin: 3.1 g/dL — ABNORMAL LOW (ref 3.5–5.0)
Anion Gap: 10 mEq/L (ref 3–11)
BUN: 10.1 mg/dL (ref 7.0–26.0)
CALCIUM: 8.7 mg/dL (ref 8.4–10.4)
CO2: 27 mEq/L (ref 22–29)
Chloride: 105 mEq/L (ref 98–109)
Creatinine: 1.2 mg/dL (ref 0.7–1.3)
Glucose: 211 mg/dl — ABNORMAL HIGH (ref 70–140)
Potassium: 3.5 mEq/L (ref 3.5–5.1)
Sodium: 142 mEq/L (ref 136–145)
Total Bilirubin: 1 mg/dL (ref 0.20–1.20)
Total Protein: 5.2 g/dL — ABNORMAL LOW (ref 6.4–8.3)

## 2014-01-03 MED ORDER — SODIUM CHLORIDE 0.9 % IV SOLN
70.0000 mg/m2 | Freq: Once | INTRAVENOUS | Status: AC
  Administered 2014-01-03: 162 mg via INTRAVENOUS
  Filled 2014-01-03: qty 1.8

## 2014-01-03 MED ORDER — ONDANSETRON 8 MG/NS 50 ML IVPB
INTRAVENOUS | Status: AC
  Filled 2014-01-03: qty 8

## 2014-01-03 MED ORDER — ONDANSETRON 8 MG/50ML IVPB (CHCC)
8.0000 mg | Freq: Once | INTRAVENOUS | Status: AC
  Administered 2014-01-03: 8 mg via INTRAVENOUS

## 2014-01-03 MED ORDER — SODIUM CHLORIDE 0.9 % IV SOLN
Freq: Once | INTRAVENOUS | Status: AC
  Administered 2014-01-03: 12:00:00 via INTRAVENOUS

## 2014-01-03 MED ORDER — DEXAMETHASONE SODIUM PHOSPHATE 10 MG/ML IJ SOLN
INTRAMUSCULAR | Status: AC
  Filled 2014-01-03: qty 1

## 2014-01-03 MED ORDER — DEXAMETHASONE SODIUM PHOSPHATE 10 MG/ML IJ SOLN
10.0000 mg | Freq: Once | INTRAMUSCULAR | Status: AC
Start: 2014-01-03 — End: 2014-01-03
  Administered 2014-01-03: 10 mg via INTRAVENOUS

## 2014-01-03 MED ORDER — SODIUM CHLORIDE 0.9 % IV SOLN
70.0000 mg/m2 | Freq: Once | INTRAVENOUS | Status: DC
  Filled 2014-01-03: qty 32

## 2014-01-03 NOTE — Patient Instructions (Signed)
[facility] Discharge Instructions for Patients Receiving Chemotherapy  Today you received the following chemotherapy agents: Treanda.  To help prevent nausea and vomiting after your treatment, we encourage you to take your nausea medication as prescribed.   If you develop nausea and vomiting that is not controlled by your nausea medication, call the clinic.   BELOW ARE SYMPTOMS THAT SHOULD BE REPORTED IMMEDIATELY:  *FEVER GREATER THAN 100.5 F  *CHILLS WITH OR WITHOUT FEVER  NAUSEA AND VOMITING THAT IS NOT CONTROLLED WITH YOUR NAUSEA MEDICATION  *UNUSUAL SHORTNESS OF BREATH  *UNUSUAL BRUISING OR BLEEDING  TENDERNESS IN MOUTH AND THROAT WITH OR WITHOUT PRESENCE OF ULCERS  *URINARY PROBLEMS  *BOWEL PROBLEMS  UNUSUAL RASH Items with * indicate a potential emergency and should be followed up as soon as possible.  Feel free to call the clinic you have any questions or concerns. The clinic phone number is [phone].    

## 2014-01-04 ENCOUNTER — Encounter: Payer: Self-pay | Admitting: *Deleted

## 2014-01-04 ENCOUNTER — Ambulatory Visit (HOSPITAL_BASED_OUTPATIENT_CLINIC_OR_DEPARTMENT_OTHER): Payer: Medicare Other

## 2014-01-04 VITALS — BP 146/76 | HR 91 | Temp 98.0°F | Resp 18

## 2014-01-04 DIAGNOSIS — Z5111 Encounter for antineoplastic chemotherapy: Secondary | ICD-10-CM

## 2014-01-04 DIAGNOSIS — C9002 Multiple myeloma in relapse: Secondary | ICD-10-CM

## 2014-01-04 LAB — KAPPA/LAMBDA LIGHT CHAINS
Kappa free light chain: 0.13 mg/dL — ABNORMAL LOW (ref 0.33–1.94)
Kappa:Lambda Ratio: 0 — ABNORMAL LOW (ref 0.26–1.65)
LAMBDA FREE LGHT CHN: 219 mg/dL — AB (ref 0.57–2.63)

## 2014-01-04 MED ORDER — DEXAMETHASONE SODIUM PHOSPHATE 10 MG/ML IJ SOLN
10.0000 mg | Freq: Once | INTRAMUSCULAR | Status: AC
  Administered 2014-01-04: 10 mg via INTRAVENOUS

## 2014-01-04 MED ORDER — SODIUM CHLORIDE 0.9 % IV SOLN
70.0000 mg/m2 | Freq: Once | INTRAVENOUS | Status: AC
  Administered 2014-01-04: 162 mg via INTRAVENOUS
  Filled 2014-01-04: qty 1.8

## 2014-01-04 MED ORDER — ONDANSETRON 8 MG/50ML IVPB (CHCC)
8.0000 mg | Freq: Once | INTRAVENOUS | Status: AC
  Administered 2014-01-04: 8 mg via INTRAVENOUS

## 2014-01-04 MED ORDER — SODIUM CHLORIDE 0.9 % IV SOLN
Freq: Once | INTRAVENOUS | Status: AC
  Administered 2014-01-04: 11:00:00 via INTRAVENOUS

## 2014-01-04 NOTE — Patient Instructions (Signed)
[facility] Discharge Instructions for Patients Receiving Chemotherapy  Today you received the following chemotherapy agents: treanda  To help prevent nausea and vomiting after your treatment, we encourage you to take your nausea medication.  Take it as often as prescribed.     If you develop nausea and vomiting that is not controlled by your nausea medication, call the clinic. If it is after clinic hours your family physician or the after hours number for the clinic or go to the Emergency Department.   BELOW ARE SYMPTOMS THAT SHOULD BE REPORTED IMMEDIATELY:  *FEVER GREATER THAN 100.5 F  *CHILLS WITH OR WITHOUT FEVER  NAUSEA AND VOMITING THAT IS NOT CONTROLLED WITH YOUR NAUSEA MEDICATION  *UNUSUAL SHORTNESS OF BREATH  *UNUSUAL BRUISING OR BLEEDING  TENDERNESS IN MOUTH AND THROAT WITH OR WITHOUT PRESENCE OF ULCERS  *URINARY PROBLEMS  *BOWEL PROBLEMS  UNUSUAL RASH Items with * indicate a potential emergency and should be followed up as soon as possible.  Feel free to call the clinic you have any questions or concerns. The clinic phone number is [phone].   I have been informed and understand all the instructions given to me. I know to contact the clinic, my physician, or go to the Emergency Department if any problems should occur. I do not have any questions at this time, but understand that I may call the clinic during office hours   should I have any questions or need assistance in obtaining follow up care.    __________________________________________  _____________  __________ Signature of Patient or Authorized Representative            Date                   Time    __________________________________________ Nurse's Signature

## 2014-01-04 NOTE — Progress Notes (Signed)
Chaplain made initial visit. Pt was present with his wife. He said he was doing well but that he didn't have good veins and that was the worst part. Pt seems to be doing well otherwise. Chaplain will follow up as necessary.

## 2014-01-12 ENCOUNTER — Telehealth: Payer: Self-pay | Admitting: Neurology

## 2014-01-12 DIAGNOSIS — C9001 Multiple myeloma in remission: Secondary | ICD-10-CM

## 2014-01-12 MED ORDER — RASAGILINE MESYLATE 1 MG PO TABS: 1.0000 mg | ORAL_TABLET | Freq: Every day | ORAL | Status: DC

## 2014-01-12 NOTE — Telephone Encounter (Signed)
I called the number provided.  It is for Silver Script through [employer] order.  I have sent the Rx to [facility], per the number I called.

## 2014-01-12 NOTE — Telephone Encounter (Signed)
Patient's wife calling requesting that patient get refill of Azilect, states they have new insurance and is wondering if it can be ordered through [employer] Their number is [phone].

## 2014-01-13 ENCOUNTER — Ambulatory Visit (HOSPITAL_BASED_OUTPATIENT_CLINIC_OR_DEPARTMENT_OTHER): Payer: Medicare Other | Admitting: Nurse Practitioner

## 2014-01-13 ENCOUNTER — Other Ambulatory Visit: Payer: Self-pay | Admitting: *Deleted

## 2014-01-13 ENCOUNTER — Other Ambulatory Visit (HOSPITAL_BASED_OUTPATIENT_CLINIC_OR_DEPARTMENT_OTHER): Payer: Medicare Other

## 2014-01-13 VITALS — BP 113/62 | HR 77 | Temp 96.7°F | Resp 18 | Ht 72.0 in | Wt 224.4 lb

## 2014-01-13 DIAGNOSIS — G620 Drug-induced polyneuropathy: Secondary | ICD-10-CM

## 2014-01-13 DIAGNOSIS — E1149 Type 2 diabetes mellitus with other diabetic neurological complication: Secondary | ICD-10-CM

## 2014-01-13 DIAGNOSIS — T451X5A Adverse effect of antineoplastic and immunosuppressive drugs, initial encounter: Secondary | ICD-10-CM

## 2014-01-13 DIAGNOSIS — I129 Hypertensive chronic kidney disease with stage 1 through stage 4 chronic kidney disease, or unspecified chronic kidney disease: Secondary | ICD-10-CM

## 2014-01-13 DIAGNOSIS — C9002 Multiple myeloma in relapse: Secondary | ICD-10-CM

## 2014-01-13 DIAGNOSIS — Z86718 Personal history of other venous thrombosis and embolism: Secondary | ICD-10-CM

## 2014-01-13 DIAGNOSIS — N189 Chronic kidney disease, unspecified: Secondary | ICD-10-CM

## 2014-01-13 DIAGNOSIS — N4889 Other specified disorders of penis: Secondary | ICD-10-CM

## 2014-01-13 DIAGNOSIS — K626 Ulcer of anus and rectum: Secondary | ICD-10-CM

## 2014-01-13 DIAGNOSIS — E1142 Type 2 diabetes mellitus with diabetic polyneuropathy: Secondary | ICD-10-CM

## 2014-01-13 LAB — CBC WITH DIFFERENTIAL/PLATELET
BASO%: 0 % (ref 0.0–2.0)
BASOS ABS: 0 10*3/uL (ref 0.0–0.1)
EOS%: 1 % (ref 0.0–7.0)
Eosinophils Absolute: 0 10*3/uL (ref 0.0–0.5)
HCT: 33.4 % — ABNORMAL LOW (ref 38.4–49.9)
HEMOGLOBIN: 11.4 g/dL — AB (ref 13.0–17.1)
LYMPH#: 0.2 10*3/uL — AB (ref 0.9–3.3)
LYMPH%: 5.9 % — ABNORMAL LOW (ref 14.0–49.0)
MCH: 32.4 pg (ref 27.2–33.4)
MCHC: 34.1 g/dL (ref 32.0–36.0)
MCV: 94.9 fL (ref 79.3–98.0)
MONO#: 0.3 10*3/uL (ref 0.1–0.9)
MONO%: 8.2 % (ref 0.0–14.0)
NEUT%: 84.9 % — ABNORMAL HIGH (ref 39.0–75.0)
NEUTROS ABS: 2.6 10*3/uL (ref 1.5–6.5)
Platelets: 88 10*3/uL — ABNORMAL LOW (ref 140–400)
RBC: 3.52 10*6/uL — ABNORMAL LOW (ref 4.20–5.82)
RDW: 15.5 % — AB (ref 11.0–14.6)
WBC: 3 10*3/uL — ABNORMAL LOW (ref 4.0–10.3)

## 2014-01-13 LAB — COMPREHENSIVE METABOLIC PANEL (CC13)
ALBUMIN: 3.1 g/dL — AB (ref 3.5–5.0)
AST: 13 U/L (ref 5–34)
Alkaline Phosphatase: 96 U/L (ref 40–150)
Anion Gap: 9 mEq/L (ref 3–11)
BUN: 13.3 mg/dL (ref 7.0–26.0)
CHLORIDE: 103 meq/L (ref 98–109)
CO2: 30 mEq/L — ABNORMAL HIGH (ref 22–29)
Calcium: 8.6 mg/dL (ref 8.4–10.4)
Creatinine: 1.1 mg/dL (ref 0.7–1.3)
Glucose: 268 mg/dl — ABNORMAL HIGH (ref 70–140)
Potassium: 3.6 mEq/L (ref 3.5–5.1)
Sodium: 142 mEq/L (ref 136–145)
Total Bilirubin: 0.89 mg/dL (ref 0.20–1.20)
Total Protein: 5.1 g/dL — ABNORMAL LOW (ref 6.4–8.3)

## 2014-01-13 MED ORDER — PREGABALIN 100 MG PO CAPS: 100.0000 mg | ORAL_CAPSULE | Freq: Every day | ORAL | Status: DC

## 2014-01-13 MED ORDER — VALACYCLOVIR HCL 1 G PO TABS: 1000.0000 mg | ORAL_TABLET | Freq: Two times a day (BID) | ORAL | Status: DC

## 2014-01-13 MED ORDER — VALACYCLOVIR HCL 500 MG PO TABS: 500.0000 mg | ORAL_TABLET | Freq: Every day | ORAL | Status: DC

## 2014-01-13 NOTE — Progress Notes (Signed)
OFFICE PROGRESS NOTE  Interval history:  [name] returns for followup of multiple myeloma. He is on active treatment with bendamustine/prednisone. He completed cycle 5 beginning 01/03/2014. Most recent serum light chain analysis on 01/03/2014 showed increased lambda free light chains at 219 compared to 96 on 12/02/2013.  He reports a persistent cough at bedtime. He intermittently expectorates white phlegm. No fever or shortness of breath. He denies nausea/vomiting. No mouth sores. No diarrhea. Chronic pain is unchanged. Neuropathy symptoms are stable. He reports "sores" at the rectum. He has had similar sores in the past. His wife describes them as initially being blisterlike.   Objective: Filed Vitals:   01/13/14 1041  BP: 113/62  Pulse: 77  Temp: 96.7 F (35.9 C)  Resp: 18   Oropharynx is without thrush or ulceration. Lungs are clear. No wheezes or rales. Regular cardiac rhythm. Abdomen is soft and nontender. No organomegaly. Trace lower leg edema bilaterally. Calves nontender. Motor strength 5 over 5. At the lower gluteal fold just posterior to the rectum there are 3 superficial ulcerations with a red base. There is a similar ulceration along the shaft of the penis.   Lab Results: Lab Results  Component Value Date   WBC 3.0* 01/13/2014   HGB 11.4* 01/13/2014   HCT 33.4* 01/13/2014   MCV 94.9 01/13/2014   PLT 88* 01/13/2014   NEUTROABS 2.6 01/13/2014    Chemistry:    Chemistry      Component Value Date/Time   NA 142 01/13/2014 1004   NA 138 12/16/2013 1350   NA 145 06/13/2009 1503   K 3.6 01/13/2014 1004   K 3.6 12/16/2013 1350   K 4.8* 06/13/2009 1503   CL 104 12/16/2013 1350   CL 104 05/18/2013 1113   CL 108 06/13/2009 1503   CO2 30* 01/13/2014 1004   CO2 24 12/16/2013 1350   CO2 29 06/13/2009 1503   BUN 13.3 01/13/2014 1004   BUN 10 12/16/2013 1350   BUN 49* 06/13/2009 1503   CREATININE 1.1 01/13/2014 1004   CREATININE 1.28 12/16/2013 1350   CREATININE 1.33 07/22/2012 1334    CREATININE 2.9* 06/13/2009 1503      Component Value Date/Time   CALCIUM 8.6 01/13/2014 1004   CALCIUM 7.7* 12/16/2013 1350   CALCIUM 10.5* 06/13/2009 1503   ALKPHOS 96 01/13/2014 1004   ALKPHOS 109 12/16/2013 1350   ALKPHOS 135* 06/13/2009 1503   AST 13 01/13/2014 1004   AST 26 12/16/2013 1350   AST 21 06/13/2009 1503   ALT <6 01/13/2014 1004   ALT <5 12/16/2013 1350   ALT 25 06/13/2009 1503   BILITOT 0.89 01/13/2014 1004   BILITOT 0.5 12/16/2013 1350   BILITOT 0.60 06/13/2009 1503       Studies/Results: Dg Chest 2 View  12/21/2013   CLINICAL DATA:  Pneumonia.  EXAM: CHEST  2 VIEW  COMPARISON:  Chest x-ray 12/17/2013.  Chest CT 03/31/2011.  FINDINGS: Mediastinum and hilar structures normal. The lungs are clear of acute infiltrates. Poor inspiration with mild basilar atelectasis. Stable cardiomegaly. No CHF. Degenerative changes thoracic spine. Mild lower thoracic vertebral body compression fractures, unchanged from prior CT .  IMPRESSION: Poor inspiration with mild basilar atelectasis. Stable cardiomegaly. Chest is stable from prior study .   Electronically Signed   By: Marcello Moores  Register   On: 12/21/2013 09:07   Dg Chest Port 1 View  12/17/2013   CLINICAL DATA:  Worsening respiratory status which shortness of breath.  EXAM: PORTABLE CHEST - 1  VIEW  COMPARISON:  Portable examination 12/15/2013. Two-view chest x-ray 03/08/2012.  FINDINGS: Suboptimal inspiration accounts for crowded bronchovascular markings, especially in the bases, and accentuates the cardiac silhouette. Taking this into account, cardiac silhouette mildly enlarged but stable. Stable atelectasis at the left lung base. Lungs remain clear otherwise. Pulmonary vascularity normal. No visible pleural effusions.  IMPRESSION: Stable atelectasis at the left lung base, likely related to suboptimal inspiration. No new abnormality since the examination 2 days ago.   Electronically Signed   By: Evangeline Dakin M.D.   On: 12/17/2013 15:39   Dg  Chest Port 1 View  12/15/2013   CLINICAL DATA:  Cough, fever and weakness.  EXAM: PORTABLE CHEST - 1 VIEW  COMPARISON:  Chest radiograph performed 03/08/2012  FINDINGS: The lungs are mildly hypoexpanded. Mild left basilar airspace opacity may reflect atelectasis or possibly mild pneumonia. Mild vascular crowding is noted. There is no evidence of pleural effusion or pneumothorax.  The cardiomediastinal silhouette is within normal limits. No acute osseous abnormalities are seen.  IMPRESSION: Lungs mildly hypoexpanded. Mild left basilar airspace opacity may reflect atelectasis or possibly mild pneumonia.   Electronically Signed   By: Garald Balding M.D.   On: 12/15/2013 22:18    Medications: I have reviewed the patient's current medications.  Assessment/Plan: 1. 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 He was found to have multiple lytic bone lesions. Serum immunoglobulins were all suppressed. He had monoclonal lambda light chains on immunofixation electrophoresis. There was marked elevation of serum free lambda light chains at 3.5 g. Initial urine showed 6.7 g of protein, primarily free lambda light chains. Bone marrow biopsy showed 87% plasma cells. He was induced into a remission with a combination of Velcade plus dexamethasone with a subsequent addition of Revlimid. Velcade was discontinued due to progressive neuropathy. He went on to receive IV melphalan with autologous bone marrow support at [facility] on 03/12/2010. He remained off of treatment until a small amount of monoclonal lambda free light chain was again found in the urine in March 2012. He was started on Revlimid 10 mg daily beginning 04/09/2011. The Revlimid schedule was changed to 21 days on followed by a 7 day break at the same dose of 10 mg in August 2012 due to myelosuppression. Due to myelosuppression the Revlimid dose was decreased to 5 mg daily 3 weeks on/1 week off following an office visit on  03/26/2012. Serum lambda free light chains returned at 9.98 on 10/19/2012 as compared to 4.85 on 07/20/2012. There was further increase of the lambda free light chains on 11/30/2012 to 25.8. 24-hour urine total protein returned at 1039 mg which was up from baseline of 260 mg. Bone survey 10/19/2012 showed multiple lesions but no new or unstable lesions. Bone marrow aspiration/biopsy done 12/30/2012 showed 10% plasma cells. Pomalidomide cycle 1 initiated on 03/09/2013. He began cycle 2 on 04/06/2013. Serum lambda light chains improved on 04/19/2013. Progressive rise in the lambda free light chains as of 07/26/2013 and rise in 24-hour urine total protein. Treatment initiated with bendamustine/prednisone 08/25/2013. 2. Type 2 diabetes. Exacerbated by steroids. He continues metformin. He is also utilizing insulin. He is followed by Dr. Delfina Redwood. 3. Right lower extremity deep vein thrombosis 05/14/2011. Coumadin anticoagulation discontinued 09/08/2013. 4. 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 He continues Lyrica. 5. Essential hypertension.  6. Parkinson's disease.  7. Recent bronchitis. 8. Hyperlipidemia. 9. Degenerative arthritis. 10. Chronic renal insufficiency.  11. History of  herpes zoster affecting a cervical dermatome following transplant dose chemotherapy. 12. Hypotensive when in the office on 09/08/2013. Diovan was discontinued.  47. Hospitalization 12/15/2013 through 12/21/2013 with influenza A, possible pneumonia. 101. "Sores" at the rectum and penis. Likely herpetic.   Dispositon-he appears stable. He has completed 5 cycles of bendamustine/prednisone. Most recent serum light chain analysis showed an increase in lambda free light chains. We will followup on the repeat values from today. Per discussion with Dr. Beryle Beams, if persistent or further elevation present we will need to consider alternative treatment such as Carfilzomib, a clinical trial with  a recombinant monoclonal antibody or oral Velcade once approved.  The ulcers at the rectum and penis are likely herpetic. He will complete a course of Valtrex 1000 mg twice daily for 10 days and then began a chronic suppressive dose of 500 mg daily.  At present he is scheduled for cycle 6 of bendamustine/prednisone beginning 01/31/2014. We will make a decision regarding continuing the same versus beginning an alternate treatment once myeloma labs from today are available.  He has a followup visit with Dr. Beryle Beams on 02/13/2014.   Plan reviewed with Dr. Beryle Beams.  25 minutes were spent face-to-face at today's visit with the majority of that time involved in counseling/coordination of care.  Ned Card ANP/GNP-BC

## 2014-01-16 LAB — KAPPA/LAMBDA LIGHT CHAINS
Kappa free light chain: 0.16 mg/dL — ABNORMAL LOW (ref 0.33–1.94)
Kappa:Lambda Ratio: 0 — ABNORMAL LOW (ref 0.26–1.65)
Lambda Free Lght Chn: 124 mg/dL — ABNORMAL HIGH (ref 0.57–2.63)

## 2014-01-17 ENCOUNTER — Other Ambulatory Visit: Payer: Medicare Other

## 2014-01-19 ENCOUNTER — Encounter: Payer: Self-pay | Admitting: Nurse Practitioner

## 2014-01-19 ENCOUNTER — Ambulatory Visit (INDEPENDENT_AMBULATORY_CARE_PROVIDER_SITE_OTHER): Payer: Medicare Other | Admitting: Nurse Practitioner

## 2014-01-19 VITALS — BP 104/69 | HR 98 | Ht 70.0 in | Wt 225.2 lb

## 2014-01-19 DIAGNOSIS — G20 Parkinson's disease: Secondary | ICD-10-CM

## 2014-01-19 DIAGNOSIS — C9001 Multiple myeloma in remission: Secondary | ICD-10-CM

## 2014-01-19 DIAGNOSIS — G20A1 Parkinson's disease without dyskinesia, without mention of fluctuations: Secondary | ICD-10-CM

## 2014-01-19 MED ORDER — RASAGILINE MESYLATE 1 MG PO TABS: 1.0000 mg | ORAL_TABLET | Freq: Every day | ORAL | Status: DC

## 2014-01-19 NOTE — Progress Notes (Signed)
[facility]  PATIENT: [name] [name] DOB: 24-Oct-1941   REASON FOR VISIT: Followup for Parkinson's disease   HISTORY OF PRESENT ILLNESS: [name] [name], 73 year old male returns for followup. Patient was last seen in this office by [name] [name] 09/07/2012. He has a history of Parkinson's disease. He is currently on Sinemet 3 times a day and Azilect without side effects. Overall he is doing well except he does not have an exercise program. He is currently taking chemotherapy for multiple myeloma. In addition he was recently hospitalized for pneumonia and flu . He returns for reevaluation.   UPDATE: 09/07/2012:He is overall doing very well, taking Azilect 1 mg every day, Sinemet 25/100 mg 3 times a day, at 9 AM, 1 PM, 5 PM, there was no significant side effect,  He has constipation, no orthostatic dizziness, he has lost sense of smell, also has REM sleep disorder   HISTORY: He has past medical history of diabetes, DVT, on chronic Coumadin treatment, hyperlipidemia, multiple myeloma, on chronic Revlimid treatment, has developed length-dependent sensory changes, is taking Lyrica Since he was diagnosed with multiple myeloma 3 years ago, he had generalized weakness, decreased functional ability, wife gradually noticed stooped forward posture, decreased arm swing, small stride, patient also complains of REM sleep disorder, decreased sensation of smell, constipation, recent few months, he was also noticed to have right hand tremor, He complains of generalized weakness, but there was no incontinence, no significant memory loss  MRI brain showed 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  He tolerated Sinemet 25/100 tid well, reported 50% improvement, he can walk better, less tremor, recent hospital admission for pneumonia, he recoverd well.  Our office has received multiple phone calls/messages from his son [name] [name] at  [place],  very angry, wants me to take off Sinemet, I have tried mutliple times without able to reach him. I have discussed with [name] [name], their son [name] [name] is not listed on HIPPA, our office should not discuss with him about [name] [name]'s medical treatment.  REVIEW OF SYSTEMS: Full 14 system review of systems performed and notable only for those listed, all others are neg:  Constitutional: Fatigue Cardiovascular: N/A  Ear/Nose/Throat: Ringing in the ears Skin: N/A  Eyes: N/A  Respiratory: N/A  Gastroitestinal: N/A  Hematology/Lymphatic: Anemia Endocrine: Excessive thirst Musculoskeletal: Back pain Allergy/Immunology: N/A  Neurological: N/A Psychiatric: N/A   ALLERGIES: No Known Allergies  HOME MEDICATIONS: Outpatient Prescriptions Prior to Visit  Medication Sig Dispense Refill  . acetaminophen (TYLENOL) 500 MG tablet Take 500 mg by mouth every 6 (six) hours as needed. For pain.      Marland Kitchen aspirin EC 81 MG tablet Take 81 mg by mouth daily after breakfast.       . atorvastatin (LIPITOR) 10 MG tablet Take 10 mg by mouth at bedtime.        . BD ULTRA-FINE PEN NEEDLES 29G X 12.7MM MISC Inject 30 Units into the skin daily.       . carbidopa-levodopa (SINEMET IR) 25-100 MG per tablet TAKE 1 TABLET THREE TIMES A DAY  30 tablet  0  . carbidopa-levodopa (SINEMET IR) 25-100 MG per tablet Take 1 tablet by mouth 3 (three) times daily.  90 tablet  1  . furosemide (LASIX) 20 MG tablet TAKE 1 TABLET (20 MG TOTAL) BY MOUTH DAILY.  60 tablet  1  . Insulin Glargine (LANTUS SOLOSTAR [place]) Inject 30 Units into the skin every  evening.       . ondansetron (ZOFRAN ODT) 8 MG disintegrating tablet Take 1 tablet (8 mg total) by mouth every 8 (eight) hours as needed for nausea.  30 tablet  prn  . oxyCODONE (OXY IR/ROXICODONE) 5 MG immediate release tablet Take 5-10 mg by mouth every 4 (four) hours as needed. For pain.      Marland Kitchen oxyCODONE (OXYCONTIN) 20 MG 12 hr tablet Take 20 mg by mouth every 12 (twelve)  hours as needed. For pain.      . polyethylene glycol (MIRALAX / GLYCOLAX) packet Take 17 g by mouth daily after breakfast.       . predniSONE (DELTASONE) 20 MG tablet Take 4 tablets = 58m with food for 4 days, starting day after chemotherapy.  100 tablet  0  . pregabalin (LYRICA) 100 MG capsule Take 1 capsule (100 mg total) by mouth daily after breakfast.  90 capsule  1  . [facility] MED[facility] treatment was on 11-30- 31. Pt takes 2 days in row once monthly. Followed by Dr GBeryle Beams next treatment is scheduled for Dec 30 and 31.      . rasagiline (AZILECT) 1 MG TABS tablet Take 1 tablet (1 mg total) by mouth daily.  90 tablet  0  . valACYclovir (VALTREX) 1000 MG tablet Take 1 tablet (1,000 mg total) by mouth 2 (two) times daily.  20 tablet  0  . valACYclovir (VALTREX) 500 MG tablet Take 1 tablet (500 mg total) by mouth daily. Begin after completing the 10 day course of Valtrex 1000 mg twice daily.  30 tablet  3   No facility-administered medications prior to visit.    PAST MEDICAL HISTORY: Past Medical History  Diagnosis Date  . Diabetes mellitus   . GERD (gastroesophageal reflux disease)   . Anemia   . Hypertension   . Prostatic hyperplasia   . Arthritis   . Multiple myeloma in relapse 12/24/2011  . Benign essential HTN 12/24/2011  . CRI (chronic renal insufficiency) 12/26/2011  . 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 DVT of lower extremity (deep venous thrombosis) 05/14/2011  . Hyperlipidemia, mixed 12/26/2011  . Herpes zoster infection 03/11/2010  . DM II (diabetes mellitus, type II), controlled 12/26/2011  . Pharyngitis, acute 03/21/2013  . 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 Diabetic neuropathy, type II diabetes mellitus 05/31/2013  . Urethritis 10/04/2013    PAST SURGICAL HISTORY: Past Surgical History  Procedure Laterality Date  . Foot surgery Left     FAMILY HISTORY: Family History  Problem  Relation Age of Onset  . CAD Neg Hx     SOCIAL HISTORY: History   Social History  . Marital Status: Married    Spouse Name: MRosaria Ferries   Number of Children: 2  . Years of Education: 12   Occupational History  .     Social History Main Topics  . Smoking status: Former Smoker    Quit date: 03/08/2008  . Smokeless tobacco: Never Used  . Alcohol Use: No  . Drug Use: No  . Sexual Activity: No   Other Topics Concern  . Not on file   Social History Narrative   Patient is married (Rosaria Ferries, has 2 children   Patient is right handed   Education level is high school   Caffeine consumption is 1 cup daily     PHYSICAL EXAM  Filed Vitals:   01/19/14 0817  BP: 104/69  Pulse: 98  Height: 5' 10"  (1.778 m)  Weight: 225 lb 4 oz (102.173 kg)   Body mass index is 32.32 kg/(m^2).  Generalized: Well developed, in no acute distress, mild masking of the face  Head: normocephalic and atraumatic,. Oropharynx benign  Neck: Supple, no carotid bruits  Cardiac: Regular rate rhythm, no murmur    Neurological examination   Mentation: Alert oriented to time, place, history taking. Follows all commands speech and language fluent  Cranial nerve II-XII: Pupils were equal round reactive to light extraocular movements were full, visual field were full on confrontational test. Facial sensation and strength were normal. hearing was intact to finger rubbing bilaterally. Uvula tongue midline. head turning and shoulder shrug were normal and symmetric.Tongue protrusion into cheek strength was normal. Motor: normal bulk and tone, full strength in the BUE, BLE, No resting tremor , no cogwheeling Sensory: Length dependent decreased light touch, pinprick to about the ankle level, preserved proprioception, and decreased vibratory sensation Coordination: finger-nose-finger, heel-to-shin bilaterally, no dysmetria Reflexes: Brachioradialis 2/2, biceps 2/2, triceps 2/2, patellar 2/2, Achilles 2/2, plantar  responses were flexor bilaterally. Gait and Station: Rising up from seated position without assistance arms crossed, ambulated 210 feet in 55 seconds. No decreased arm swing, no shuffling gait, no difficulty with turns.  DIAGNOSTIC DATA (LABS, IMAGING, TESTING) - I reviewed patient records, labs, notes, testing and imaging myself where available.  Lab Results  Component Value Date   WBC 3.0* 01/13/2014   HGB 11.4* 01/13/2014   HCT 33.4* 01/13/2014   MCV 94.9 01/13/2014   PLT 88* 01/13/2014      Component Value Date/Time   NA 142 01/13/2014 1004   NA 138 12/16/2013 1350   NA 145 06/13/2009 1503   K 3.6 01/13/2014 1004   K 3.6 12/16/2013 1350   K 4.8* 06/13/2009 1503   CL 104 12/16/2013 1350   CL 104 05/18/2013 1113   CL 108 06/13/2009 1503   CO2 30* 01/13/2014 1004   CO2 24 12/16/2013 1350   CO2 29 06/13/2009 1503   GLUCOSE 268* 01/13/2014 1004   GLUCOSE 192* 12/16/2013 1350   GLUCOSE 325* 05/18/2013 1113   GLUCOSE 114 06/13/2009 1503   BUN 13.3 01/13/2014 1004   BUN 10 12/16/2013 1350   BUN 49* 06/13/2009 1503   CREATININE 1.1 01/13/2014 1004   CREATININE 1.28 12/16/2013 1350   CREATININE 1.33 07/22/2012 1334   CREATININE 2.9* 06/13/2009 1503   CALCIUM 8.6 01/13/2014 1004   CALCIUM 7.7* 12/16/2013 1350   CALCIUM 10.5* 06/13/2009 1503   PROT 5.1* 01/13/2014 1004   PROT 5.1* 12/16/2013 1350   PROT 6.4 06/13/2009 1503   ALBUMIN 3.1* 01/13/2014 1004   ALBUMIN 2.9* 12/16/2013 1350   AST 13 01/13/2014 1004   AST 26 12/16/2013 1350   AST 21 06/13/2009 1503   ALT <6 01/13/2014 1004   ALT <5 12/16/2013 1350   ALT 25 06/13/2009 1503   ALKPHOS 96 01/13/2014 1004   ALKPHOS 109 12/16/2013 1350   ALKPHOS 135* 06/13/2009 1503   BILITOT 0.89 01/13/2014 1004   BILITOT 0.5 12/16/2013 1350   BILITOT 0.60 06/13/2009 1503   GFRNONAA 54* 12/16/2013 1350   GFRAA 62* 12/16/2013 1350    Lab Results  Component Value Date   TSH 0.252* 12/16/2013      ASSESSMENT AND PLAN  73 y.o. year old male  has a past  medical history of multiple myeloma, diabetes, deep vein thrombosis on Coumadin, 2 year history of right hand resting tremor  with REM sleep disorder and Parkinson's features on exam. He has responded to Sinemet well.   Continue carbidopa levodopa 3 times daily Continue Azilect 1 mg daily, will refill Walk every day for exercise Followup in 6-8 months Dennie Bible, [facility], [facility], APRN  [facility] [address], [place], [place] [address] [phone]

## 2014-01-19 NOTE — Patient Instructions (Signed)
Continue carbidopa levodopa 3 times daily Continue Azilect 1 mg daily, will refill Walk every day for exercise Followup in 6-8 months

## 2014-01-31 ENCOUNTER — Other Ambulatory Visit (HOSPITAL_BASED_OUTPATIENT_CLINIC_OR_DEPARTMENT_OTHER): Payer: Medicare Other

## 2014-01-31 ENCOUNTER — Other Ambulatory Visit: Payer: Self-pay | Admitting: Oncology

## 2014-01-31 ENCOUNTER — Ambulatory Visit (HOSPITAL_BASED_OUTPATIENT_CLINIC_OR_DEPARTMENT_OTHER): Payer: Medicare Other

## 2014-01-31 VITALS — BP 116/61 | HR 85 | Temp 97.9°F | Resp 18

## 2014-01-31 DIAGNOSIS — T451X5A Adverse effect of antineoplastic and immunosuppressive drugs, initial encounter: Principal | ICD-10-CM

## 2014-01-31 DIAGNOSIS — Z5111 Encounter for antineoplastic chemotherapy: Secondary | ICD-10-CM

## 2014-01-31 DIAGNOSIS — G620 Drug-induced polyneuropathy: Secondary | ICD-10-CM

## 2014-01-31 DIAGNOSIS — C9002 Multiple myeloma in relapse: Secondary | ICD-10-CM

## 2014-01-31 LAB — CBC WITH DIFFERENTIAL/PLATELET
BASO%: 0.6 % (ref 0.0–2.0)
BASOS ABS: 0 10*3/uL (ref 0.0–0.1)
EOS%: 1.8 % (ref 0.0–7.0)
Eosinophils Absolute: 0.1 10*3/uL (ref 0.0–0.5)
HEMATOCRIT: 32.8 % — AB (ref 38.4–49.9)
HEMOGLOBIN: 11.3 g/dL — AB (ref 13.0–17.1)
LYMPH%: 29.2 % (ref 14.0–49.0)
MCH: 32.8 pg (ref 27.2–33.4)
MCHC: 34.5 g/dL (ref 32.0–36.0)
MCV: 95.3 fL (ref 79.3–98.0)
MONO#: 0.4 10*3/uL (ref 0.1–0.9)
MONO%: 12.2 % (ref 0.0–14.0)
NEUT#: 1.9 10*3/uL (ref 1.5–6.5)
NEUT%: 56.2 % (ref 39.0–75.0)
Platelets: 91 10*3/uL — ABNORMAL LOW (ref 140–400)
RBC: 3.44 10*6/uL — ABNORMAL LOW (ref 4.20–5.82)
RDW: 16.2 % — ABNORMAL HIGH (ref 11.0–14.6)
WBC: 3.3 10*3/uL — ABNORMAL LOW (ref 4.0–10.3)
lymph#: 1 10*3/uL (ref 0.9–3.3)

## 2014-01-31 LAB — COMPREHENSIVE METABOLIC PANEL (CC13)
ALK PHOS: 112 U/L (ref 40–150)
ALT: 7 U/L (ref 0–55)
AST: 18 U/L (ref 5–34)
Albumin: 3.6 g/dL (ref 3.5–5.0)
Anion Gap: 8 mEq/L (ref 3–11)
BUN: 11.2 mg/dL (ref 7.0–26.0)
CO2: 29 mEq/L (ref 22–29)
CREATININE: 1.3 mg/dL (ref 0.7–1.3)
Calcium: 8.9 mg/dL (ref 8.4–10.4)
Chloride: 105 mEq/L (ref 98–109)
GLUCOSE: 275 mg/dL — AB (ref 70–140)
Potassium: 3.9 mEq/L (ref 3.5–5.1)
Sodium: 141 mEq/L (ref 136–145)
Total Bilirubin: 0.76 mg/dL (ref 0.20–1.20)
Total Protein: 5.9 g/dL — ABNORMAL LOW (ref 6.4–8.3)

## 2014-01-31 MED ORDER — DEXAMETHASONE SODIUM PHOSPHATE 10 MG/ML IJ SOLN
10.0000 mg | Freq: Once | INTRAMUSCULAR | Status: AC
  Administered 2014-01-31: 10 mg via INTRAVENOUS

## 2014-01-31 MED ORDER — ONDANSETRON 8 MG/50ML IVPB (CHCC)
8.0000 mg | Freq: Once | INTRAVENOUS | Status: AC
  Administered 2014-01-31: 8 mg via INTRAVENOUS

## 2014-01-31 MED ORDER — SODIUM CHLORIDE 0.9 % IV SOLN
Freq: Once | INTRAVENOUS | Status: AC
  Administered 2014-01-31: 12:00:00 via INTRAVENOUS

## 2014-01-31 MED ORDER — ONDANSETRON 8 MG/NS 50 ML IVPB
INTRAVENOUS | Status: AC
  Filled 2014-01-31: qty 8

## 2014-01-31 MED ORDER — DEXAMETHASONE SODIUM PHOSPHATE 10 MG/ML IJ SOLN
INTRAMUSCULAR | Status: AC
  Filled 2014-01-31: qty 1

## 2014-01-31 MED ORDER — HEPARIN SOD (PORK) LOCK FLUSH 100 UNIT/ML IV SOLN
500.0000 [IU] | Freq: Once | INTRAVENOUS | Status: DC | PRN
  Filled 2014-01-31: qty 5

## 2014-01-31 MED ORDER — SODIUM CHLORIDE 0.9 % IJ SOLN
10.0000 mL | INTRAMUSCULAR | Status: DC | PRN
  Filled 2014-01-31: qty 10

## 2014-01-31 MED ORDER — BENDAMUSTINE HCL CHEMO INJECTION 180 MG/2ML
70.0000 mg/m2 | Freq: Once | INTRAVENOUS | Status: AC
  Administered 2014-01-31: 162 mg via INTRAVENOUS
  Filled 2014-01-31: qty 1.8

## 2014-01-31 NOTE — Progress Notes (Signed)
Ok to treat with platelets 91 per Dr. Beryle Beams.

## 2014-01-31 NOTE — Patient Instructions (Signed)
[facility] Discharge Instructions for Patients Receiving Chemotherapy  Today you received the following chemotherapy agents: Treanda.  To help prevent nausea and vomiting after your treatment, we encourage you to take your nausea medication as prescribed.   If you develop nausea and vomiting that is not controlled by your nausea medication, call the clinic.   BELOW ARE SYMPTOMS THAT SHOULD BE REPORTED IMMEDIATELY:  *FEVER GREATER THAN 100.5 F  *CHILLS WITH OR WITHOUT FEVER  NAUSEA AND VOMITING THAT IS NOT CONTROLLED WITH YOUR NAUSEA MEDICATION  *UNUSUAL SHORTNESS OF BREATH  *UNUSUAL BRUISING OR BLEEDING  TENDERNESS IN MOUTH AND THROAT WITH OR WITHOUT PRESENCE OF ULCERS  *URINARY PROBLEMS  *BOWEL PROBLEMS  UNUSUAL RASH Items with * indicate a potential emergency and should be followed up as soon as possible.  Feel free to call the clinic you have any questions or concerns. The clinic phone number is [phone].    

## 2014-02-01 ENCOUNTER — Ambulatory Visit (HOSPITAL_BASED_OUTPATIENT_CLINIC_OR_DEPARTMENT_OTHER): Payer: Medicare Other

## 2014-02-01 ENCOUNTER — Telehealth: Payer: Self-pay | Admitting: *Deleted

## 2014-02-01 VITALS — BP 124/71 | HR 92 | Temp 97.0°F | Resp 18

## 2014-02-01 DIAGNOSIS — C9002 Multiple myeloma in relapse: Secondary | ICD-10-CM

## 2014-02-01 DIAGNOSIS — Z5111 Encounter for antineoplastic chemotherapy: Secondary | ICD-10-CM

## 2014-02-01 LAB — KAPPA/LAMBDA LIGHT CHAINS
Kappa free light chain: 0.16 mg/dL — ABNORMAL LOW (ref 0.33–1.94)
Kappa:Lambda Ratio: 0 — ABNORMAL LOW (ref 0.26–1.65)
Lambda Free Lght Chn: 261 mg/dL — ABNORMAL HIGH (ref 0.57–2.63)

## 2014-02-01 MED ORDER — ONDANSETRON 8 MG/NS 50 ML IVPB
INTRAVENOUS | Status: AC
  Filled 2014-02-01: qty 8

## 2014-02-01 MED ORDER — ONDANSETRON 8 MG/50ML IVPB (CHCC)
8.0000 mg | Freq: Once | INTRAVENOUS | Status: AC
  Administered 2014-02-01: 8 mg via INTRAVENOUS

## 2014-02-01 MED ORDER — DEXAMETHASONE SODIUM PHOSPHATE 10 MG/ML IJ SOLN
10.0000 mg | Freq: Once | INTRAMUSCULAR | Status: AC
  Administered 2014-02-01: 10 mg via INTRAVENOUS

## 2014-02-01 MED ORDER — DEXAMETHASONE SODIUM PHOSPHATE 10 MG/ML IJ SOLN
INTRAMUSCULAR | Status: AC
  Filled 2014-02-01: qty 1

## 2014-02-01 MED ORDER — SODIUM CHLORIDE 0.9 % IV SOLN
Freq: Once | INTRAVENOUS | Status: AC
  Administered 2014-02-01: 11:00:00 via INTRAVENOUS

## 2014-02-01 MED ORDER — SODIUM CHLORIDE 0.9 % IV SOLN
70.0000 mg/m2 | Freq: Once | INTRAVENOUS | Status: AC
  Administered 2014-02-01: 162 mg via INTRAVENOUS
  Filled 2014-02-01: qty 1.8

## 2014-02-01 NOTE — Patient Instructions (Signed)
[facility] Discharge Instructions for Patients Receiving Chemotherapy  Today you received the following chemotherapy agents Treanda.  To help prevent nausea and vomiting after your treatment, we encourage you to take your nausea medication Zofran 8 mg disintegrating tablets as ordered by Dr. Beryle Beams.   If you develop nausea and vomiting that is not controlled by your nausea medication, call the clinic.   BELOW ARE SYMPTOMS THAT SHOULD BE REPORTED IMMEDIATELY:  *FEVER GREATER THAN 100.5 F  *CHILLS WITH OR WITHOUT FEVER  NAUSEA AND VOMITING THAT IS NOT CONTROLLED WITH YOUR NAUSEA MEDICATION  *UNUSUAL SHORTNESS OF BREATH  *UNUSUAL BRUISING OR BLEEDING  TENDERNESS IN MOUTH AND THROAT WITH OR WITHOUT PRESENCE OF ULCERS  *URINARY PROBLEMS  *BOWEL PROBLEMS  UNUSUAL RASH Items with * indicate a potential emergency and should be followed up as soon as possible.  Feel free to call the clinic you have any questions or concerns. The clinic phone number is [phone].

## 2014-02-01 NOTE — Telephone Encounter (Signed)
Message copied by Norma Fredrickson on Wed Feb 01, 2014 11:01 AM ------      Message from: Ned Card K      Created: Wed Feb 01, 2014  9:02 AM       Please let patient/wife know blood sugar was elevated yesterday and make sure he is checking CBGs at home. Thanks.      ----- Message -----         From: [facility]         Sent: 01/31/2014  11:15 AM           To: Owens Shark, NP                   ------

## 2014-02-01 NOTE — Telephone Encounter (Signed)
Called and informed patient's wife Rosaria Ferries) of patient's elevated blood sugar yesterday and also encouraged to continue checking CBGs at home.  Per Elby Showers. Marcello Moores, NP.  Patient's wife verbalized understanding.

## 2014-02-01 NOTE — Progress Notes (Signed)
Discharged at 1300, ambulatory in no distress with spouse.

## 2014-02-02 LAB — UIFE/LIGHT CHAINS/TP QN, 24-HR UR
ALPHA 2 UR: DETECTED — AB
Albumin, U: DETECTED
Alpha 1, Urine: DETECTED — AB
BETA UR: DETECTED — AB
Free Kappa Lt Chains,Ur: 11.2 mg/dL — ABNORMAL HIGH (ref 0.14–2.42)
Free Kappa/Lambda Ratio: 0.01 ratio — ABNORMAL LOW (ref 2.04–10.37)
Free Lambda Excretion/Day: 14175 mg/d
Free Lambda Lt Chains,Ur: 945 mg/dL — ABNORMAL HIGH (ref 0.02–0.67)
Free Lt Chn Excr Rate: 168 mg/d
Gamma Globulin, Urine: DETECTED — AB
TOTAL PROTEIN, URINE-UR/DAY: 14417 mg/d — AB (ref 10–140)
Time: 24 hours
Total Protein, Urine: 961.1 mg/dL
Volume, Urine: 1500 mL

## 2014-02-13 ENCOUNTER — Ambulatory Visit (HOSPITAL_BASED_OUTPATIENT_CLINIC_OR_DEPARTMENT_OTHER): Payer: Medicare Other | Admitting: Oncology

## 2014-02-13 ENCOUNTER — Telehealth: Payer: Self-pay | Admitting: Oncology

## 2014-02-13 VITALS — BP 114/64 | HR 87 | Temp 97.5°F | Resp 20 | Ht 70.0 in | Wt 225.2 lb

## 2014-02-13 DIAGNOSIS — N189 Chronic kidney disease, unspecified: Secondary | ICD-10-CM

## 2014-02-13 DIAGNOSIS — C9002 Multiple myeloma in relapse: Secondary | ICD-10-CM

## 2014-02-13 DIAGNOSIS — G20 Parkinson's disease: Secondary | ICD-10-CM

## 2014-02-13 DIAGNOSIS — E119 Type 2 diabetes mellitus without complications: Secondary | ICD-10-CM

## 2014-02-13 DIAGNOSIS — I10 Essential (primary) hypertension: Secondary | ICD-10-CM

## 2014-02-13 MED ORDER — ACYCLOVIR 400 MG PO TABS: 400.0000 mg | ORAL_TABLET | Freq: Two times a day (BID) | ORAL | Status: DC

## 2014-02-13 NOTE — Progress Notes (Signed)
Hematology and Oncology Follow Up Visit  [name] [name] [id number] [birth year] 73 y.o. 02/13/2014 7:54 PM   Principle Diagnosis: Encounter Diagnosis  Name Primary?  . Multiple myeloma in relapse Yes     Interim History:   Followup visit for this 73 year old man on active treatment for relapsed lambda light chain multiple myeloma. He was initially diagnosed in June 2010.He had a heavy myeloma burden at time of diagnosis with multiple lytic bone lesions, 87% plasma cells in the bone marrow, and 6.7 g of protein in the urine. 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 He went on to receive high-dose IV melphalan with autologous stem cell support at [facility] 03/12/2010. We began to see small amounts of monoclonal lambda free light chains again in his urine in March of 2012. He was started on Revlimid 10 mg daily beginning in April 2012. Dose had to be adjusted due to myelosuppression down to 5 mg. At time of a visit here in December 2013, he was complaining of recurrent pain in his shoulders, bilateral ribs, and low back. Lab showed rise in his serum and urine paraprotein levels. He was restaged with a bone marrow biopsy on 12/31/2012 which showed 10% plasma cells, rise in total urine protein from 260 mg to 1039 mg, and rise in serum free lambda light chains from 4.8 mg percent in July 2013,to 10 mg percent on October 29, to a 25.8 mg percent by December 16, to 58.5 mg percent by 01/07/2013.  I started him on pomalidomide on 01/28/2013. Initial dose 3 mg 21 days on 7 days rest.  He tolerated the drug well. I was cautiously optimistic that we were starting to see a response.  Unfortunately, at time of his visit here on August 12 he appeared to be breaking through the pomalidomide. Although his hematologic profile remained stable, there was  a progressive rise in the lambda free light chains up to 116 mg percent as of  07/26/2013 compared with 58 on May 28. 24 hour urine total protein back up to 6645 mg.  After a lengthy discussion with respect to treatment options, I recommended a trial of Bendamustine plus prednisone. This was started on September 4. He has not had 6 cycles from 02/01/2014. He appeared to have a initial improvement with fall in his serum lambda free light chains from 116 mg percent down to 91 by November 2014, fall in 24 hour urine protein from 6.6 g down to 5.4, fall in his creatinine from 1.4 down to 1.1. Unfortunately, recent reevaluation done on February 11 shows rise in the serum light chains up to 261 mg percent, urine 24-hour protein up to 14.4 g, and creatinine up to 1.3. Hematologic profile has remained relatively stable with hemoglobin in the 12-13 g range and platelets in the 100-120,000 range when he recovers from the nadir of the chemotherapy cycle.  He is still having vague, chronic, bilateral lower rib pain radiating in an unusual fashion up his spine. Worse with motion. No effects on deep breathing. He had a brief hospital admission for pneumonitis. He developed a area of localized herpetic appearing sores in the perirectal area and on the penis in January and was put on a course of Valtrex 500 mg 3 times daily then decrease to 500 mg daily prophylactic dose.     Medications: reviewed  Allergies: No Known Allergies  Review of Systems: Hematology: No bleeding or  bruising  ENT ROS: No sore throat Breast ROS:  Respiratory ROS: No cough or dyspnea Cardiovascular ROS:  No ischemic type chest pain or palpitations Gastrointestinal ROS: No abdominal pain or change in bowel habit   Genito-Urinary ROS: No change in chronic nocturia Musculoskeletal ROS: See above Neurological ROS: No headache or change in vision. Still having mild paresthesias feet worse than hands. Dermatological ROS: See above  Remaining ROS negative:   Physical Exam: Blood pressure 114/64, pulse 87, temperature  97.5 F (36.4 C), temperature source Oral, resp. rate 20, height 5' 10"  (1.778 m), weight 225 lb 3.2 oz (102.15 kg). Wt Readings from Last 3 Encounters:  02/13/14 225 lb 3.2 oz (102.15 kg)  01/19/14 225 lb 4 oz (102.173 kg)  01/13/14 224 lb 6.4 oz (101.787 kg)     General appearance: Well-nourished African American man HENNT: Pharynx no erythema, exudate, mass, or ulcer. No thyromegaly or thyroid nodules Lymph nodes: No cervical, supraclavicular, or axillary lymphadenopathy Breasts:  Lungs: Clear to auscultation, resonant to percussion throughout Heart: Regular rhythm, no murmur, no gallop, no rub, no click, no edema Abdomen: Soft, nontender, normal bowel sounds, no mass, no organomegaly, resolution of abdominal edema since reducing steroids. Extremities: Significant resolution of the previous ankle edema. 1+ pedal edema, no calf tenderness Musculoskeletal: no joint deformities GU:  Vascular: Carotid pulses 2+, no bruits, Neurologic: Alert, oriented, PERRLA,  , cranial nerves grossly normal, motor strength 5 over 5, reflexes 1+ symmetric, upper body coordination normal, gait normal, minimal decrease in vibration sensation over the fingertips by tuning fork exam Skin: No rash or ecchymosis  Lab Results: CBC W/Diff    Component Value Date/Time   WBC 3.3* 01/31/2014 1058   WBC 4.0 12/16/2013 1350   WBC 5.6 06/13/2009 1503   RBC 3.44* 01/31/2014 1058   RBC 3.54* 12/16/2013 1350   HGB 11.3* 01/31/2014 1058   HGB 11.6* 12/16/2013 1350   HGB 9.0* 06/13/2009 1503   HCT 32.8* 01/31/2014 1058   HCT 32.9* 12/16/2013 1350   HCT 25.6* 06/13/2009 1503   PLT 91* 01/31/2014 1058   PLT 82* 12/16/2013 1606   PLT 189 06/13/2009 1503   MCV 95.3 01/31/2014 1058   MCV 92.9 12/16/2013 1350   MCV 90 06/13/2009 1503   MCH 32.8 01/31/2014 1058   MCH 32.8 12/16/2013 1350   MCH 31.4 06/13/2009 1503   MCHC 34.5 01/31/2014 1058   MCHC 35.3 12/16/2013 1350   MCHC 35.0 06/13/2009 1503   RDW 16.2* 01/31/2014 1058    RDW 14.1 12/16/2013 1350   RDW 15.2* 06/13/2009 1503   LYMPHSABS 1.0 01/31/2014 1058   LYMPHSABS 0.4* 12/16/2013 1350   LYMPHSABS 1.5 06/13/2009 1503   MONOABS 0.4 01/31/2014 1058   MONOABS 0.2 12/16/2013 1350   EOSABS 0.1 01/31/2014 1058   EOSABS 0.0 12/16/2013 1350   EOSABS 0.1 06/13/2009 1503   BASOSABS 0.0 01/31/2014 1058   BASOSABS 0.0 12/16/2013 1350   BASOSABS 0.0 06/13/2009 1503     Chemistry      Component Value Date/Time   NA 141 01/31/2014 1059   NA 138 12/16/2013 1350   NA 145 06/13/2009 1503   K 3.9 01/31/2014 1059   K 3.6 12/16/2013 1350   K 4.8* 06/13/2009 1503   CL 104 12/16/2013 1350   CL 104 05/18/2013 1113   CL 108 06/13/2009 1503   CO2 29 01/31/2014 1059   CO2 24 12/16/2013 1350   CO2 29 06/13/2009 1503   BUN 11.2 01/31/2014 1059  BUN 10 12/16/2013 1350   BUN 49* 06/13/2009 1503   CREATININE 1.3 01/31/2014 1059   CREATININE 1.28 12/16/2013 1350   CREATININE 1.33 07/22/2012 1334   CREATININE 2.9* 06/13/2009 1503      Component Value Date/Time   CALCIUM 8.9 01/31/2014 1059   CALCIUM 7.7* 12/16/2013 1350   CALCIUM 10.5* 06/13/2009 1503   ALKPHOS 112 01/31/2014 1059   ALKPHOS 109 12/16/2013 1350   ALKPHOS 135* 06/13/2009 1503   AST 18 01/31/2014 1059   AST 26 12/16/2013 1350   AST 21 06/13/2009 1503   ALT 7 01/31/2014 1059   ALT <5 12/16/2013 1350   ALT 25 06/13/2009 1503   BILITOT 0.76 01/31/2014 1059   BILITOT 0.5 12/16/2013 1350   BILITOT 0.60 06/13/2009 1503      Impression:  #1. Lambda light chain myeloma Minimal response to Treanda. Only option available at this time is a trial of kyprolis. We reviewed the use of this drug and I gave him and his wife written information about it. Plan to begin on Tuesday, March 10. Continue Valtrex viral prophylaxis.  #2. Type 2 diabetes. Exacerbated by steroids. He continues metformin. He is also utilizing insulin. He is followed by Dr. Delfina Redwood. #3. Right lower extremity deep vein thrombosis 05/14/2011. Coumadin anticoagulation  discontinued 09/08/2013.  #4. 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 He continues Lyrica. #5. Essential hypertension.  #6. Parkinson's disease.  #7. Recent bronchitis with brief hospital admission. Late December 2014. #8. Hyperlipidemia.  #9. Degenerative arthritis.  #10. Chronic renal insufficiency.  #11. 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 #12. Hospitalization 12/15/2013 through 12/21/2013 with influenza A, possible pneumonia. #13. Sores" at the rectum and penis. Likely herpetic. January 2015      CC: Patient Care Team: Kandice Hams, MD as PCP - General (Internal Medicine) Annia Belt, MD as Consulting Physician (Oncology) Annia Belt as Consulting Physician (Internal Medicine)   Annia Belt, MD 2/23/20157:54 PM

## 2014-02-13 NOTE — Patient Instructions (Signed)
We are stopping Treanda and starting a new chemo agent:  Carfilzomab (Kyprolis). This drug is given by vein over 1 hour 2 days in a row, 3 weeks out of 4 Main possible side effects are neuropathy May increase viral infections:  I am prescribing Acyclovir, 1 twice daily to prevent herpes  virus infections Call for any unusual side effects We will start the new chemo on Tuesday & Wednesday, March 10,2015 Carfilzomib injection What is this medicine? CARFILZOMIB (kar FILZ oh mib) is a chemotherapy drug that works by slowing or stopping cancer cell growth. This medicine is used to treat multiple myeloma. This medicine may be used for other purposes; ask your health care provider or pharmacist if you have questions. COMMON BRAND NAME(S): KYPROLIS What should I tell my health care provider before I take this medicine? They need to know if you have any of these conditions: -heart disease -irregular heartbeat -liver disease -lung or breathing disease -an unusual or allergic reaction to carfilzomib, or other medicines, foods, dyes, or preservatives -pregnant or trying to get pregnant -breast-feeding How should I use this medicine? This medicine is for injection or infusion into a vein. It is given by a health care professional in a hospital or clinic setting. Talk to your pediatrician regarding the use of this medicine in children. Special care may be needed. Overdosage: If you think you've taken too much of this medicine contact a poison control center or emergency room at once. Overdosage: If you think you have taken too much of this medicine contact a poison control center or emergency room at once. NOTE: This medicine is only for you. Do not share this medicine with others. What if I miss a dose? It is important not to miss your dose. Call your doctor or health care professional if you are unable to keep an appointment. What may interact with this medicine? Interactions are not expected. Give  your health care provider a list of all the medicines, herbs, non-prescription drugs, or dietary supplements you use. Also tell them if you smoke, drink alcohol, or use illegal drugs. Some items may interact with your medicine. This list may not describe all possible interactions. Give your health care provider a list of all the medicines, herbs, non-prescription drugs, or dietary supplements you use. Also tell them if you smoke, drink alcohol, or use illegal drugs. Some items may interact with your medicine. What should I watch for while using this medicine? Your condition will be monitored carefully while you are receiving this medicine. Report any side effects. Continue your course of treatment even though you feel ill unless your doctor tells you to stop. Call your doctor or health care professional for advice if you get a fever, chills or sore throat, or other symptoms of a cold or flu. Do not treat yourself. Try to avoid being around people who are sick. Do not become pregnant while taking this medicine. Women should inform their doctor if they wish to become pregnant or think they might be pregnant. There is a potential for serious side effects to an unborn child. Talk to your health care professional or pharmacist for more information. Do not breast-feed an infant while taking this medicine. Check with your doctor or health care professional if you get an attack of severe diarrhea, nausea and vomiting, or if you sweat a lot. The loss of too much body fluid can make it dangerous for you to take this medicine. You may get dizzy. Do not drive, use  machinery, or do anything that needs mental alertness until you know how this medicine affects you. Do not stand or sit up quickly, especially if you are an older patient. This reduces the risk of dizzy or fainting spells. What side effects may I notice from receiving this medicine? Side effects that you should report to your doctor or health care  professional as soon as possible: -allergic reactions like skin rash, itching or hives, swelling of the face, lips, or tongue -breathing problems -chest pain or palpitationschest tightness -cough -dark urine -dizziness -feeling faint or lightheaded -fever or chills -general ill feeling or flu-like symptoms -light-colored stools -palpitations -right upper belly pain -swelling of the legs or ankles -unusual bleeding or bruising -unusually weak or tired -yellowing of the eyes or skin  Side effects that usually do not require medical attention (Report these to your doctor or health care professional if they continue or are bothersome.): -diarrhea -headache -nausea, vomiting -tiredness This list may not describe all possible side effects. Call your doctor for medical advice about side effects. You may report side effects to FDA at 1-800-FDA-1088. Where should I keep my medicine? This drug is given in a hospital or clinic and will not be stored at home. NOTE: This sheet is a summary. It may not cover all possible information. If you have questions about this medicine, talk to your doctor, pharmacist, or health care provider.  2014, Elsevier/Gold Standard. (2012-05-28 17:02:29) Acyclovir tablets or capsules What is this medicine? ACYCLOVIR (ay SYE kloe veer) is an antiviral medicine. It is used to treat or prevent infections caused by certain kinds of viruses. Examples of these infections include herpes and shingles. This medicine will not cure herpes. This medicine may be used for other purposes; ask your health care provider or pharmacist if you have questions. COMMON BRAND NAME(S): Zovirax What should I tell my health care provider before I take this medicine? They need to know if you have any of these conditions: -kidney disease -an unusual or allergic reaction to acyclovir, ganciclovir, valacyclovir, other medicines, foods, dyes, or preservatives -pregnant or trying to get  pregnant -breast-feeding How should I use this medicine? Take this medicine by mouth with a glass of water. Follow the directions on the prescription label. You can take it with or without food. Take your medicine at regular intervals. Do not take your medicine more often than directed. Take all of your medicine as directed even if you think your are better. Do not skip doses or stop your medicine early. Talk to your pediatrician regarding the use of this medicine in children. While this drug may be prescribed for selected conditions, precautions do apply. Overdosage: If you think you have taken too much of this medicine contact a poison control center or emergency room at once. NOTE: This medicine is only for you. Do not share this medicine with others. What if I miss a dose? If you miss a dose, take it as soon as you can. If it is almost time for your next dose, take only that dose. Do not take double or extra doses. What may interact with this medicine? -probenecid This list may not describe all possible interactions. Give your health care provider a list of all the medicines, herbs, non-prescription drugs, or dietary supplements you use. Also tell them if you smoke, drink alcohol, or use illegal drugs. Some items may interact with your medicine. What should I watch for while using this medicine? Tell your doctor or health care professional  if your symptoms do not improve. This medicine works best when started very early in the course of an infection. Begin treatment at the first signs of infection. Drink 6 to 8 glasses of water or fluids every day while you are taking this medicine. This will help prevent side effects. You can still pass chickenpox, shingles, or herpes to another person even while you are taking this medicine. Avoid contact with others as directed. Genital herpes is a sexually transmitted disease. Talk to your doctor about how to stop the spread of infection. What side effects  may I notice from receiving this medicine? Side effects that you should report to your doctor or health care professional as soon as possible: -allergic reactions like skin rash, itching or hives, swelling of the face, lips, or tongue -chest pain -confusion, hallucinations, tremor -dark urine -increased sensitivity to the sun -redness, blistering, peeling or loosening of the skin, including inside the mouth -seizures -trouble passing urine or change in the amount of urine -unusual bleeding or bruising, or pinpoint red spots on the skin -unusually weak or tired -yellowing of the eyes or skin Side effects that usually do not require medical attention (report to your doctor or health care professional if they continue or are bothersome): -diarrhea -fever -headache -nausea, vomiting -stomach upset This list may not describe all possible side effects. Call your doctor for medical advice about side effects. You may report side effects to FDA at 1-800-FDA-1088. Where should I keep my medicine? Keep out of the reach of children. Store at room temperature between 15 and 25 degrees C (59 and 77 degrees F). Throw away any unused medicine after the expiration date. NOTE: This sheet is a summary. It may not cover all possible information. If you have questions about this medicine, talk to your doctor, pharmacist, or health care provider.  2014, Elsevier/Gold Standard. (2008-02-23 13:15:46)

## 2014-02-13 NOTE — Telephone Encounter (Signed)
gv and printed appt sched and avs for pt for March...sed added tx  °

## 2014-02-15 ENCOUNTER — Ambulatory Visit: Payer: Self-pay | Admitting: Nurse Practitioner

## 2014-02-20 ENCOUNTER — Telehealth: Payer: Self-pay | Admitting: *Deleted

## 2014-02-20 ENCOUNTER — Encounter: Payer: Self-pay | Admitting: Oncology

## 2014-02-20 ENCOUNTER — Other Ambulatory Visit: Payer: Self-pay | Admitting: Oncology

## 2014-02-20 ENCOUNTER — Inpatient Hospital Stay (HOSPITAL_COMMUNITY): Admission: RE | Admit: 2014-02-20 | Payer: BLUE CROSS/BLUE SHIELD | Source: Ambulatory Visit

## 2014-02-20 DIAGNOSIS — C9002 Multiple myeloma in relapse: Secondary | ICD-10-CM

## 2014-02-20 NOTE — Telephone Encounter (Signed)
Received vm call from pt's wife stating that pt has chemo 03/01/14 & has decided he wants a port due to trouble finding veins.  Note to Dr Beryle Beams.

## 2014-02-20 NOTE — Telephone Encounter (Signed)
Close encounter 

## 2014-02-21 ENCOUNTER — Telehealth: Payer: Self-pay | Admitting: Oncology

## 2014-02-21 ENCOUNTER — Other Ambulatory Visit: Payer: Self-pay | Admitting: Radiology

## 2014-02-21 NOTE — Telephone Encounter (Signed)
, °

## 2014-02-22 ENCOUNTER — Encounter (HOSPITAL_COMMUNITY): Payer: Self-pay

## 2014-02-22 ENCOUNTER — Ambulatory Visit (HOSPITAL_COMMUNITY)
Admission: RE | Admit: 2014-02-22 | Discharge: 2014-02-22 | Disposition: A | Discharge status: Home / Self Care | Payer: Medicare Other | Source: Ambulatory Visit | Attending: Oncology | Admitting: Oncology

## 2014-02-22 ENCOUNTER — Other Ambulatory Visit: Payer: Self-pay | Admitting: Oncology

## 2014-02-22 DIAGNOSIS — Z794 Long term (current) use of insulin: Secondary | ICD-10-CM | POA: Insufficient documentation

## 2014-02-22 DIAGNOSIS — N189 Chronic kidney disease, unspecified: Secondary | ICD-10-CM | POA: Insufficient documentation

## 2014-02-22 DIAGNOSIS — E785 Hyperlipidemia, unspecified: Secondary | ICD-10-CM | POA: Insufficient documentation

## 2014-02-22 DIAGNOSIS — K219 Gastro-esophageal reflux disease without esophagitis: Secondary | ICD-10-CM | POA: Insufficient documentation

## 2014-02-22 DIAGNOSIS — E1142 Type 2 diabetes mellitus with diabetic polyneuropathy: Secondary | ICD-10-CM | POA: Insufficient documentation

## 2014-02-22 DIAGNOSIS — E1149 Type 2 diabetes mellitus with other diabetic neurological complication: Secondary | ICD-10-CM | POA: Insufficient documentation

## 2014-02-22 DIAGNOSIS — C9002 Multiple myeloma in relapse: Secondary | ICD-10-CM

## 2014-02-22 DIAGNOSIS — I129 Hypertensive chronic kidney disease with stage 1 through stage 4 chronic kidney disease, or unspecified chronic kidney disease: Secondary | ICD-10-CM | POA: Insufficient documentation

## 2014-02-22 DIAGNOSIS — Z79899 Other long term (current) drug therapy: Secondary | ICD-10-CM | POA: Insufficient documentation

## 2014-02-22 DIAGNOSIS — Z7982 Long term (current) use of aspirin: Secondary | ICD-10-CM | POA: Insufficient documentation

## 2014-02-22 DIAGNOSIS — C9000 Multiple myeloma not having achieved remission: Secondary | ICD-10-CM | POA: Insufficient documentation

## 2014-02-22 DIAGNOSIS — Z86718 Personal history of other venous thrombosis and embolism: Secondary | ICD-10-CM | POA: Insufficient documentation

## 2014-02-22 LAB — CBC WITH DIFFERENTIAL/PLATELET
BASOS ABS: 0 10*3/uL (ref 0.0–0.1)
BASOS PCT: 0 % (ref 0–1)
EOS PCT: 2 % (ref 0–5)
Eosinophils Absolute: 0.1 10*3/uL (ref 0.0–0.7)
HEMATOCRIT: 35.9 % — AB (ref 39.0–52.0)
Hemoglobin: 12.5 g/dL — ABNORMAL LOW (ref 13.0–17.0)
Lymphocytes Relative: 35 % (ref 12–46)
Lymphs Abs: 1.1 10*3/uL (ref 0.7–4.0)
MCH: 33.5 pg (ref 26.0–34.0)
MCHC: 34.8 g/dL (ref 30.0–36.0)
MCV: 96.2 fL (ref 78.0–100.0)
Monocytes Absolute: 0.5 10*3/uL (ref 0.1–1.0)
Monocytes Relative: 16 % — ABNORMAL HIGH (ref 3–12)
NEUTROS ABS: 1.5 10*3/uL — AB (ref 1.7–7.7)
Neutrophils Relative %: 47 % (ref 43–77)
Platelets: 77 10*3/uL — ABNORMAL LOW (ref 150–400)
RBC: 3.73 MIL/uL — ABNORMAL LOW (ref 4.22–5.81)
RDW: 15 % (ref 11.5–15.5)
WBC: 3.3 10*3/uL — ABNORMAL LOW (ref 4.0–10.5)

## 2014-02-22 LAB — PROTIME-INR
INR: 1.01 (ref 0.00–1.49)
Prothrombin Time: 13.1 seconds (ref 11.6–15.2)

## 2014-02-22 LAB — GLUCOSE, CAPILLARY: Glucose-Capillary: 173 mg/dL — ABNORMAL HIGH (ref 70–99)

## 2014-02-22 LAB — APTT: APTT: 28 s (ref 24–37)

## 2014-02-22 MED ORDER — LIDOCAINE-EPINEPHRINE (PF) 2 %-1:200000 IJ SOLN
INTRAMUSCULAR | Status: DC
Start: 2014-02-22 — End: 2014-02-23
  Filled 2014-02-22: qty 20

## 2014-02-22 MED ORDER — MIDAZOLAM HCL 2 MG/2ML IJ SOLN
INTRAMUSCULAR | Status: AC
  Filled 2014-02-22: qty 6

## 2014-02-22 MED ORDER — LIDOCAINE HCL 1 % IJ SOLN
INTRAMUSCULAR | Status: DC
Start: 2014-02-22 — End: 2014-02-23
  Filled 2014-02-22: qty 20

## 2014-02-22 MED ORDER — FENTANYL CITRATE 0.05 MG/ML IJ SOLN
INTRAMUSCULAR | Status: AC
  Filled 2014-02-22: qty 6

## 2014-02-22 MED ORDER — SODIUM CHLORIDE 0.9 % IV SOLN
INTRAVENOUS | Status: DC
  Administered 2014-02-22: 20 mL/h via INTRAVENOUS

## 2014-02-22 MED ORDER — MIDAZOLAM HCL 2 MG/2ML IJ SOLN
INTRAMUSCULAR | Status: AC | PRN
  Administered 2014-02-22: 0.5 mg via INTRAVENOUS
  Administered 2014-02-22: 1 mg via INTRAVENOUS

## 2014-02-22 MED ORDER — HEPARIN SOD (PORK) LOCK FLUSH 100 UNIT/ML IV SOLN
INTRAVENOUS | Status: AC | PRN
  Administered 2014-02-22: 500 [IU]

## 2014-02-22 MED ORDER — CEFAZOLIN SODIUM-DEXTROSE 2-3 GM-% IV SOLR
2.0000 g | Freq: Once | INTRAVENOUS | Status: AC
  Administered 2014-02-22: 2 g via INTRAVENOUS

## 2014-02-22 MED ORDER — FENTANYL CITRATE 0.05 MG/ML IJ SOLN
INTRAMUSCULAR | Status: AC | PRN
  Administered 2014-02-22: 50 ug via INTRAVENOUS

## 2014-02-22 MED ORDER — CEFAZOLIN SODIUM-DEXTROSE 2-3 GM-% IV SOLR
INTRAVENOUS | Status: DC
Start: 2014-02-22 — End: 2014-02-23
  Filled 2014-02-22: qty 50

## 2014-02-22 MED ORDER — HEPARIN SOD (PORK) LOCK FLUSH 100 UNIT/ML IV SOLN
INTRAVENOUS | Status: AC
  Filled 2014-02-22: qty 5

## 2014-02-22 NOTE — Discharge Instructions (Signed)
Implanted Port Home Guide °An implanted port is a type of central line that is placed under the skin. Central lines are used to provide IV access when treatment or nutrition needs to be given through a person's veins. Implanted ports are used for long-term IV access. An implanted port may be placed because:  °· You need IV medicine that would be irritating to the small veins in your hands or arms.   °· You need long-term IV medicines, such as antibiotics.   °· You need IV nutrition for a long period.   °· You need frequent blood draws for lab tests.   °· You need dialysis.   °Implanted ports are usually placed in the chest area, but they can also be placed in the upper arm, the abdomen, or the leg. An implanted port has two main parts:  °· Reservoir. The reservoir is round and will appear as a small, raised area under your skin. The reservoir is the part where a needle is inserted to give medicines or draw blood.   °· Catheter. The catheter is a thin, flexible tube that extends from the reservoir. The catheter is placed into a large vein. Medicine that is inserted into the reservoir goes into the catheter and then into the vein.   °HOW WILL I CARE FOR MY INCISION SITE? °Do not get the incision site wet. Bathe or shower as directed by your health care provider.  °HOW IS MY PORT ACCESSED? °Special steps must be taken to access the port:  °· Before the port is accessed, a numbing cream can be placed on the skin. This helps numb the skin over the port site.   °· Your health care provider uses a sterile technique to access the port. °· Your health care provider must put on a mask and sterile gloves. °· The skin over your port is cleaned carefully with an antiseptic and allowed to dry. °· The port is gently pinched between sterile gloves, and a needle is inserted into the port. °· Only "non-coring" port needles should be used to access the port. Once the port is accessed, a blood return should be checked. This helps  ensure that the port is in the vein and is not clogged.   °· If your port needs to remain accessed for a constant infusion, a clear (transparent) bandage will be placed over the needle site. The bandage and needle will need to be changed every week, or as directed by your health care provider.   °· Keep the bandage covering the needle clean and dry. Do not get it wet. Follow your health care provider's instructions on how to take a shower or bath while the port is accessed.   °· If your port does not need to stay accessed, no bandage is needed over the port.   °WHAT IS FLUSHING? °Flushing helps keep the port from getting clogged. Follow your health care provider's instructions on how and when to flush the port. Ports are usually flushed with saline solution or a medicine called heparin. The need for flushing will depend on how the port is used.  °· If the port is used for intermittent medicines or blood draws, the port will need to be flushed:   °· After medicines have been given.   °· After blood has been drawn.   °· As part of routine maintenance.   °· If a constant infusion is running, the port may not need to be flushed.   °HOW LONG WILL MY PORT STAY IMPLANTED? °The port can stay in for as long as your health care   provider thinks it is needed. When it is time for the port to come out, surgery will be done to remove it. The procedure is similar to the one performed when the port was put in.  °WHEN SHOULD I SEEK IMMEDIATE MEDICAL CARE? °When you have an implanted port, you should seek immediate medical care if:  °· You notice a bad smell coming from the incision site.   °· You have swelling, redness, or drainage at the incision site.   °· You have more swelling or pain at the port site or the surrounding area.   °· You have a fever that is not controlled with medicine. °Document Released: 12/08/2005 Document Revised: 09/28/2013 Document Reviewed: 08/15/2013 °ExitCare® Patient Information ©2014 [employer]. °Moderate Sedation, Adult °Moderate sedation is given to help you relax or even sleep through a procedure. You may remain sleepy, be clumsy, or have poor balance for several hours following this procedure. Arrange for a responsible adult, family member, or friend to take you home. A responsible adult should stay with you for at least 24 hours or until the medicines have worn off. °· Do not participate in any activities where you could become injured for the next 24 hours, or until you feel normal again. Do not: °· Drive. °· Swim. °· Ride a bicycle. °· Operate heavy machinery. °· Cook. °· Use power tools. °· Climb ladders. °· Work at heights. °· Do not make important decisions or sign legal documents until you are improved. °· Vomiting may occur if you eat too soon. When you can drink without vomiting, try water, juice, or soup. Try solid foods if you feel little or no nausea. °· Only take over-the-counter or prescription medications for pain, discomfort, or fever as directed by your caregiver.If pain medications have been prescribed for you, ask your caregiver how soon it is safe to take them. °· Make sure you and your family fully understands everything about the medication given to you. Make sure you understand what side effects may occur. °· You should not drink alcohol, take sleeping pills, or medications that cause drowsiness for at least 24 hours. °· If you smoke, do not smoke alone. °· If you are feeling better, you may resume normal activities 24 hours after receiving sedation. °· Keep all appointments as scheduled. Follow all instructions. °· Ask questions if you do not understand. °SEEK MEDICAL CARE IF:  °· Your skin is pale or bluish in color. °· You continue to feel sick to your stomach (nauseous) or throw up (vomit). °· Your pain is getting worse and not helped by medication. °· You have bleeding or swelling. °· You are still sleepy or feeling clumsy after 24 hours. °SEEK IMMEDIATE MEDICAL CARE IF:   °· You develop a rash. °· You have difficulty breathing. °· You develop any type of allergic problem. °· You have a fever. °Document Released: 09/02/2001 Document Revised: 03/01/2012 Document Reviewed: 08/15/2013 °ExitCare® Patient Information ©2014 [employer]. ° °

## 2014-02-22 NOTE — H&P (Signed)
[name] [name] is an 73 y.o. male.   Chief Complaint: "I'm here for a port a cath" HPI: Patient with history of relapsing multiple myeloma presents today for port a cath placement for chemotherapy.  Past Medical History  Diagnosis Date  . Diabetes mellitus   . GERD (gastroesophageal reflux disease)   . Anemia   . Hypertension   . Prostatic hyperplasia   . Arthritis   . Multiple myeloma in relapse 12/24/2011  . Benign essential HTN 12/24/2011  . CRI (chronic renal insufficiency) 12/26/2011  . 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 DVT of lower extremity (deep venous thrombosis) 05/14/2011  . Hyperlipidemia, mixed 12/26/2011  . Herpes zoster infection 03/11/2010  . DM II (diabetes mellitus, type II), controlled 12/26/2011  . Pharyngitis, acute 03/21/2013  . 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 Diabetic neuropathy, type II diabetes mellitus 05/31/2013  . Urethritis 10/04/2013    Past Surgical History  Procedure Laterality Date  . Foot surgery Left     Family History  Problem Relation Age of Onset  . CAD Neg Hx    Social History:  reports that he quit smoking about 5 years ago. He has never used smokeless tobacco. He reports that he does not drink alcohol or use illicit drugs.  Allergies: No Known Allergies  Current outpatient prescriptions:acetaminophen (TYLENOL) 500 MG tablet, Take 500 mg by mouth every 6 (six) hours as needed. For pain., Disp: , Rfl: ;  acyclovir (ZOVIRAX) 400 MG tablet, Take 1 tablet (400 mg total) by mouth 2 (two) times daily., Disp: 60 tablet, Rfl: 6;  aspirin EC 81 MG tablet, Take 81 mg by mouth daily after breakfast. , Disp: , Rfl: ;  atorvastatin (LIPITOR) 10 MG tablet, Take 10 mg by mouth at bedtime.  , Disp: , Rfl:  carbidopa-levodopa (SINEMET IR) 25-100 MG per tablet, Take 1 tablet by mouth 3 (three) times daily., Disp: 90 tablet, Rfl: 1;  furosemide (LASIX) 20 MG tablet, Take 20 mg by mouth daily., Disp: , Rfl: ;   Insulin Glargine (LANTUS SOLOSTAR [place]), Inject 20-30 Units into the skin every evening. , Disp: , Rfl:  ondansetron (ZOFRAN ODT) 8 MG disintegrating tablet, Take 1 tablet (8 mg total) by mouth every 8 (eight) hours as needed for nausea., Disp: 30 tablet, Rfl: prn;  oxyCODONE (OXY IR/ROXICODONE) 5 MG immediate release tablet, Take 5-10 mg by mouth every 4 (four) hours as needed. For pain., Disp: , Rfl: ;  oxyCODONE (OXYCONTIN) 20 MG 12 hr tablet, Take 20 mg by mouth every 12 (twelve) hours as needed. For pain., Disp: , Rfl:  polyethylene glycol (MIRALAX / GLYCOLAX) packet, Take 17 g by mouth daily after breakfast. , Disp: , Rfl: ;  predniSONE (DELTASONE) 20 MG tablet, Take 4 tablets = 25m with food for 4 days, starting day after chemotherapy., Disp: 100 tablet, Rfl: 0;  pregabalin (LYRICA) 100 MG capsule, Take 1 capsule (100 mg total) by mouth daily after breakfast., Disp: 90 capsule, Rfl: 1 rasagiline (AZILECT) 1 MG TABS tablet, Take 1 tablet (1 mg total) by mouth daily., Disp: 30 tablet, Rfl: 6;  valACYclovir (VALTREX) 500 MG tablet, Take 1 tablet (500 mg total) by mouth daily. Begin after completing the 10 day course of Valtrex 1000 mg twice daily., Disp: 30 tablet, Rfl: 3;  BD ULTRA-FINE PEN NEEDLES 29G X 12.7MM MISC, Inject 30 Units into the skin daily. , Disp: , Rfl:  Current facility-administered medications:0.9 %  sodium chloride infusion, ,  Intravenous, Continuous, Ascencion Dike, PA-C, Last Rate: 20 mL/hr at 02/22/14 1156, 20 mL/hr at 02/22/14 1156;  ceFAZolin (ANCEF) IVPB 2 g/50 mL premix, 2 g, Intravenous, Once, Ascencion Dike, PA-C   Results for orders placed during the hospital encounter of 02/22/14 (from the past 48 hour(s))  APTT     Status: None   Collection Time    02/22/14 11:45 AM      Result Value Ref Range   aPTT 28  24 - 37 seconds  CBC WITH DIFFERENTIAL     Status: Abnormal   Collection Time    02/22/14 11:45 AM      Result Value Ref Range   WBC 3.3 (*) 4.0 - 10.5 K/uL   RBC  3.73 (*) 4.22 - 5.81 MIL/uL   Hemoglobin 12.5 (*) 13.0 - 17.0 g/dL   HCT 35.9 (*) 39.0 - 52.0 %   MCV 96.2  78.0 - 100.0 fL   MCH 33.5  26.0 - 34.0 pg   MCHC 34.8  30.0 - 36.0 g/dL   RDW 15.0  11.5 - 15.5 %   Platelets 77 (*) 150 - 400 K/uL   Comment: PLATELET COUNT CONFIRMED BY SMEAR   Neutrophils Relative % 47  43 - 77 %   Neutro Abs 1.5 (*) 1.7 - 7.7 K/uL   Lymphocytes Relative 35  12 - 46 %   Lymphs Abs 1.1  0.7 - 4.0 K/uL   Monocytes Relative 16 (*) 3 - 12 %   Monocytes Absolute 0.5  0.1 - 1.0 K/uL   Eosinophils Relative 2  0 - 5 %   Eosinophils Absolute 0.1  0.0 - 0.7 K/uL   Basophils Relative 0  0 - 1 %   Basophils Absolute 0.0  0.0 - 0.1 K/uL  PROTIME-INR     Status: None   Collection Time    02/22/14 11:45 AM      Result Value Ref Range   Prothrombin Time 13.1  11.6 - 15.2 seconds   INR 1.01  0.00 - 1.49   No results found.  Review of Systems  Constitutional: Negative for fever and chills.  Respiratory: Negative for shortness of breath.        Occ cough  Cardiovascular: Negative for chest pain.  Gastrointestinal: Negative for nausea, vomiting and abdominal pain.  Musculoskeletal:       Occ back pain  Neurological: Negative for headaches.       Hx neuropathy of hands/feet  Endo/Heme/Allergies: Does not bruise/bleed easily.    Blood pressure 143/83, pulse 87, temperature 98.1 F (36.7 C), temperature source Oral, resp. rate 18, SpO2 98.00%. Physical Exam  Constitutional: He is oriented to person, place, and time. He appears well-developed and well-nourished.  Cardiovascular: Normal rate and regular rhythm.   Respiratory: Effort normal and breath sounds normal.  GI: Soft. Bowel sounds are normal. There is no tenderness.  Musculoskeletal: Normal range of motion. He exhibits edema.  Neurological: He is alert and oriented to person, place, and time.     Assessment/Plan Patient with history of relapsing multiple myeloma presents today for port a cath placement  for chemotherapy. Details/risks of procedure d/w pt/family with their understanding and consent.   Eboni Coval,D KEVIN 02/22/2014, 1:36 PM

## 2014-02-22 NOTE — Procedures (Signed)
Successful RT IJ POWER PORT TIP SVC/RA NO COMP STABLE READY FOR USE  

## 2014-02-23 ENCOUNTER — Telehealth: Payer: Self-pay | Admitting: *Deleted

## 2014-02-23 MED ORDER — LIDOCAINE-PRILOCAINE 2.5-2.5 % EX CREA: 1.0000 "application " | TOPICAL_CREAM | CUTANEOUS | Status: AC | PRN

## 2014-02-23 NOTE — Telephone Encounter (Signed)
Received call from pt stating that pt had port placed yest & would like cream for port discussed by nurses.  Returned call & discussed emla cream & instructions given how to use & script e-scribed to pharmacy.

## 2014-02-28 ENCOUNTER — Other Ambulatory Visit (HOSPITAL_BASED_OUTPATIENT_CLINIC_OR_DEPARTMENT_OTHER): Payer: BLUE CROSS/BLUE SHIELD

## 2014-02-28 ENCOUNTER — Ambulatory Visit (HOSPITAL_BASED_OUTPATIENT_CLINIC_OR_DEPARTMENT_OTHER): Payer: Medicare Other

## 2014-02-28 ENCOUNTER — Encounter (INDEPENDENT_AMBULATORY_CARE_PROVIDER_SITE_OTHER): Payer: Self-pay

## 2014-02-28 VITALS — BP 116/65 | HR 88 | Temp 97.5°F

## 2014-02-28 DIAGNOSIS — B029 Zoster without complications: Secondary | ICD-10-CM

## 2014-02-28 DIAGNOSIS — IMO0002 Reserved for concepts with insufficient information to code with codable children: Secondary | ICD-10-CM

## 2014-02-28 DIAGNOSIS — Z5111 Encounter for antineoplastic chemotherapy: Secondary | ICD-10-CM

## 2014-02-28 DIAGNOSIS — C9002 Multiple myeloma in relapse: Secondary | ICD-10-CM

## 2014-02-28 DIAGNOSIS — N189 Chronic kidney disease, unspecified: Secondary | ICD-10-CM

## 2014-02-28 LAB — CBC WITH DIFFERENTIAL/PLATELET
BASO%: 0.5 % (ref 0.0–2.0)
Basophils Absolute: 0 10*3/uL (ref 0.0–0.1)
EOS%: 1.2 % (ref 0.0–7.0)
Eosinophils Absolute: 0.1 10*3/uL (ref 0.0–0.5)
HCT: 35 % — ABNORMAL LOW (ref 38.4–49.9)
HGB: 12.2 g/dL — ABNORMAL LOW (ref 13.0–17.1)
LYMPH#: 1.5 10*3/uL (ref 0.9–3.3)
LYMPH%: 36.1 % (ref 14.0–49.0)
MCH: 33.4 pg (ref 27.2–33.4)
MCHC: 34.9 g/dL (ref 32.0–36.0)
MCV: 95.9 fL (ref 79.3–98.0)
MONO#: 0.7 10*3/uL (ref 0.1–0.9)
MONO%: 16 % — ABNORMAL HIGH (ref 0.0–14.0)
NEUT#: 1.9 10*3/uL (ref 1.5–6.5)
NEUT%: 46.2 % (ref 39.0–75.0)
Platelets: 83 10*3/uL — ABNORMAL LOW (ref 140–400)
RBC: 3.65 10*6/uL — AB (ref 4.20–5.82)
RDW: 14.8 % — AB (ref 11.0–14.6)
WBC: 4.1 10*3/uL (ref 4.0–10.3)
nRBC: 0 % (ref 0–0)

## 2014-02-28 LAB — COMPREHENSIVE METABOLIC PANEL (CC13)
ALBUMIN: 3.5 g/dL (ref 3.5–5.0)
ALT: <6 U/L (ref 0–55)
AST: 20 U/L (ref 5–34)
Alkaline Phosphatase: 121 U/L (ref 40–150)
Anion Gap: 9 mEq/L (ref 3–11)
BUN: 12.3 mg/dL (ref 7.0–26.0)
CO2: 26 mEq/L (ref 22–29)
Calcium: 9.1 mg/dL (ref 8.4–10.4)
Chloride: 106 mEq/L (ref 98–109)
Creatinine: 1.4 mg/dL — ABNORMAL HIGH (ref 0.7–1.3)
Glucose: 226 mg/dl — ABNORMAL HIGH (ref 70–140)
POTASSIUM: 3.7 meq/L (ref 3.5–5.1)
SODIUM: 141 meq/L (ref 136–145)
Total Bilirubin: 0.69 mg/dL (ref 0.20–1.20)
Total Protein: 5.8 g/dL — ABNORMAL LOW (ref 6.4–8.3)

## 2014-02-28 MED ORDER — SODIUM CHLORIDE 0.9 % IV SOLN
Freq: Once | INTRAVENOUS | Status: AC
  Administered 2014-02-28: 12:00:00 via INTRAVENOUS

## 2014-02-28 MED ORDER — DEXAMETHASONE SODIUM PHOSPHATE 10 MG/ML IJ SOLN
INTRAMUSCULAR | Status: AC
  Filled 2014-02-28: qty 1

## 2014-02-28 MED ORDER — HEPARIN SOD (PORK) LOCK FLUSH 100 UNIT/ML IV SOLN
500.0000 [IU] | Freq: Once | INTRAVENOUS | Status: AC | PRN
  Administered 2014-02-28: 500 [IU]
  Filled 2014-02-28: qty 5

## 2014-02-28 MED ORDER — ONDANSETRON 8 MG/50ML IVPB (CHCC)
8.0000 mg | Freq: Once | INTRAVENOUS | Status: AC
  Administered 2014-02-28: 8 mg via INTRAVENOUS

## 2014-02-28 MED ORDER — DEXAMETHASONE SODIUM PHOSPHATE 10 MG/ML IJ SOLN
10.0000 mg | Freq: Once | INTRAMUSCULAR | Status: AC
  Administered 2014-02-28: 10 mg via INTRAVENOUS

## 2014-02-28 MED ORDER — DEXTROSE 5 % IV SOLN
20.0000 mg/m2 | Freq: Once | INTRAVENOUS | Status: AC
  Administered 2014-02-28: 46 mg via INTRAVENOUS
  Filled 2014-02-28: qty 23

## 2014-02-28 MED ORDER — SODIUM CHLORIDE 0.9 % IJ SOLN
10.0000 mL | INTRAMUSCULAR | Status: DC | PRN
  Administered 2014-02-28: 10 mL
  Filled 2014-02-28: qty 10

## 2014-02-28 MED ORDER — ONDANSETRON 8 MG/NS 50 ML IVPB
INTRAVENOUS | Status: AC
  Filled 2014-02-28: qty 8

## 2014-02-28 NOTE — Progress Notes (Signed)
Ok to treat today per Dr. Darnell Level.  Platelets 64.

## 2014-02-28 NOTE — Patient Instructions (Signed)
[place] Discharge Instructions for Patients Receiving Chemotherapy  Today you received the following chemotherapy agents kyprolis  To help prevent nausea and vomiting after your treatment, we encourage you to take your nausea medication as directed.    If you develop nausea and vomiting that is not controlled by your nausea medication, call the clinic.   BELOW ARE SYMPTOMS THAT SHOULD BE REPORTED IMMEDIATELY:  *FEVER GREATER THAN 100.5 F  *CHILLS WITH OR WITHOUT FEVER  NAUSEA AND VOMITING THAT IS NOT CONTROLLED WITH YOUR NAUSEA MEDICATION  *UNUSUAL SHORTNESS OF BREATH  *UNUSUAL BRUISING OR BLEEDING  TENDERNESS IN MOUTH AND THROAT WITH OR WITHOUT PRESENCE OF ULCERS  *URINARY PROBLEMS  *BOWEL PROBLEMS  UNUSUAL RASH Items with * indicate a potential emergency and should be followed up as soon as possible.  Feel free to call the clinic you have any questions or concerns. The clinic phone number is [phone].  Carfilzomib injection What is this medicine? CARFILZOMIB (kar FILZ oh mib) is a chemotherapy drug that works by slowing or stopping cancer cell growth. This medicine is used to treat multiple myeloma. This medicine may be used for other purposes; ask your health care provider or pharmacist if you have questions. COMMON BRAND NAME(S): KYPROLIS What should I tell my health care provider before I take this medicine? They need to know if you have any of these conditions: -heart disease -irregular heartbeat -liver disease -lung or breathing disease -an unusual or allergic reaction to carfilzomib, or other medicines, foods, dyes, or preservatives -pregnant or trying to get pregnant -breast-feeding How should I use this medicine? This medicine is for injection or infusion into a vein. It is given by a health care professional in a hospital or clinic setting. Talk to your pediatrician regarding the use of this medicine in children. Special care may be  needed. Overdosage: If you think you've taken too much of this medicine contact a poison control center or emergency room at once. Overdosage: If you think you have taken too much of this medicine contact a poison control center or emergency room at once. NOTE: This medicine is only for you. Do not share this medicine with others. What if I miss a dose? It is important not to miss your dose. Call your doctor or health care professional if you are unable to keep an appointment. What may interact with this medicine? Interactions are not expected. Give your health care provider a list of all the medicines, herbs, non-prescription drugs, or dietary supplements you use. Also tell them if you smoke, drink alcohol, or use illegal drugs. Some items may interact with your medicine. This list may not describe all possible interactions. Give your health care provider a list of all the medicines, herbs, non-prescription drugs, or dietary supplements you use. Also tell them if you smoke, drink alcohol, or use illegal drugs. Some items may interact with your medicine. What should I watch for while using this medicine? Your condition will be monitored carefully while you are receiving this medicine. Report any side effects. Continue your course of treatment even though you feel ill unless your doctor tells you to stop. Call your doctor or health care professional for advice if you get a fever, chills or sore throat, or other symptoms of a cold or flu. Do not treat yourself. Try to avoid being around people who are sick. Do not become pregnant while taking this medicine. Women should inform their doctor if they wish to become pregnant or  think they might be pregnant. There is a potential for serious side effects to an unborn child. Talk to your health care professional or pharmacist for more information. Do not breast-feed an infant while taking this medicine. Check with your doctor or health care professional if you  get an attack of severe diarrhea, nausea and vomiting, or if you sweat a lot. The loss of too much body fluid can make it dangerous for you to take this medicine. You may get dizzy. Do not drive, use machinery, or do anything that needs mental alertness until you know how this medicine affects you. Do not stand or sit up quickly, especially if you are an older patient. This reduces the risk of dizzy or fainting spells. What side effects may I notice from receiving this medicine? Side effects that you should report to your doctor or health care professional as soon as possible: -allergic reactions like skin rash, itching or hives, swelling of the face, lips, or tongue -breathing problems -chest pain or palpitationschest tightness -cough -dark urine -dizziness -feeling faint or lightheaded -fever or chills -general ill feeling or flu-like symptoms -light-colored stools -palpitations -right upper belly pain -swelling of the legs or ankles -unusual bleeding or bruising -unusually weak or tired -yellowing of the eyes or skin  Side effects that usually do not require medical attention (Report these to your doctor or health care professional if they continue or are bothersome.): -diarrhea -headache -nausea, vomiting -tiredness This list may not describe all possible side effects. Call your doctor for medical advice about side effects. You may report side effects to FDA at 1-800-FDA-1088. Where should I keep my medicine? This drug is given in a hospital or clinic and will not be stored at home. NOTE: This sheet is a summary. It may not cover all possible information. If you have questions about this medicine, talk to your doctor, pharmacist, or health care provider.  2014, [employer]vier/Gold Standard. (2012-05-28 17:02:29)

## 2014-03-01 ENCOUNTER — Encounter (INDEPENDENT_AMBULATORY_CARE_PROVIDER_SITE_OTHER): Payer: Self-pay

## 2014-03-01 ENCOUNTER — Ambulatory Visit (HOSPITAL_BASED_OUTPATIENT_CLINIC_OR_DEPARTMENT_OTHER): Payer: Medicare Other

## 2014-03-01 VITALS — BP 146/78 | HR 90 | Temp 97.9°F | Resp 19 | Ht 70.0 in

## 2014-03-01 DIAGNOSIS — Z5112 Encounter for antineoplastic immunotherapy: Secondary | ICD-10-CM

## 2014-03-01 DIAGNOSIS — C9002 Multiple myeloma in relapse: Secondary | ICD-10-CM

## 2014-03-01 DIAGNOSIS — B029 Zoster without complications: Secondary | ICD-10-CM

## 2014-03-01 DIAGNOSIS — N189 Chronic kidney disease, unspecified: Secondary | ICD-10-CM

## 2014-03-01 DIAGNOSIS — IMO0002 Reserved for concepts with insufficient information to code with codable children: Secondary | ICD-10-CM

## 2014-03-01 MED ORDER — DEXAMETHASONE SODIUM PHOSPHATE 10 MG/ML IJ SOLN
10.0000 mg | Freq: Once | INTRAMUSCULAR | Status: AC
Start: 2014-03-01 — End: 2014-03-01
  Administered 2014-03-01: 10 mg via INTRAVENOUS

## 2014-03-01 MED ORDER — HEPARIN SOD (PORK) LOCK FLUSH 100 UNIT/ML IV SOLN
500.0000 [IU] | Freq: Once | INTRAVENOUS | Status: AC | PRN
  Administered 2014-03-01: 500 [IU]
  Filled 2014-03-01: qty 5

## 2014-03-01 MED ORDER — DEXAMETHASONE SODIUM PHOSPHATE 10 MG/ML IJ SOLN
INTRAMUSCULAR | Status: AC
  Filled 2014-03-01: qty 1

## 2014-03-01 MED ORDER — DEXTROSE 5 % IV SOLN
20.0000 mg/m2 | Freq: Once | INTRAVENOUS | Status: AC
  Administered 2014-03-01: 46 mg via INTRAVENOUS
  Filled 2014-03-01: qty 23

## 2014-03-01 MED ORDER — SODIUM CHLORIDE 0.9 % IJ SOLN
10.0000 mL | INTRAMUSCULAR | Status: DC | PRN
  Administered 2014-03-01: 10 mL
  Filled 2014-03-01: qty 10

## 2014-03-01 MED ORDER — SODIUM CHLORIDE 0.9 % IV SOLN
Freq: Once | INTRAVENOUS | Status: AC
  Administered 2014-03-01: 12:00:00 via INTRAVENOUS

## 2014-03-01 MED ORDER — ONDANSETRON 8 MG/50ML IVPB (CHCC)
8.0000 mg | Freq: Once | INTRAVENOUS | Status: AC
  Administered 2014-03-01: 8 mg via INTRAVENOUS

## 2014-03-01 MED ORDER — SODIUM CHLORIDE 0.9 % IV SOLN
Freq: Once | INTRAVENOUS | Status: AC
  Administered 2014-03-01: 13:00:00 via INTRAVENOUS

## 2014-03-01 MED ORDER — ONDANSETRON 8 MG/NS 50 ML IVPB
INTRAVENOUS | Status: AC
  Filled 2014-03-01: qty 8

## 2014-03-01 NOTE — Patient Instructions (Signed)
[place] Discharge Instructions for Patients Receiving Chemotherapy  Today you received the following chemotherapy agents KYPROLIS  To help prevent nausea and vomiting after your treatment, we encourage you to take your nausea medication IF NEEDED YOU CAN START ABOUT 6 PM.   If you develop nausea and vomiting that is not controlled by your nausea medication, call the clinic.   BELOW ARE SYMPTOMS THAT SHOULD BE REPORTED IMMEDIATELY:  *FEVER GREATER THAN 100.5 F  *CHILLS WITH OR WITHOUT FEVER  NAUSEA AND VOMITING THAT IS NOT CONTROLLED WITH YOUR NAUSEA MEDICATION  *UNUSUAL SHORTNESS OF BREATH  *UNUSUAL BRUISING OR BLEEDING  TENDERNESS IN MOUTH AND THROAT WITH OR WITHOUT PRESENCE OF ULCERS  *URINARY PROBLEMS  *BOWEL PROBLEMS  UNUSUAL RASH Items with * indicate a potential emergency and should be followed up as soon as possible.  WATCH FOR INCREASED THROAT PAIN AND REDNESS. WATCH FOR FEVER. MAY USE HEARTBURN MEDICATION IF NEEDED. MAY USE ADVIL OR TYLENOL FOR THROAT PAIN.  Feel free to call the clinic you have any questions or concerns. The clinic phone number is [phone].

## 2014-03-01 NOTE — Progress Notes (Signed)
Pt c/o burning sore throat, almost took a tums. No redness noted. Dr Beryle Beams notified. Instructed pt to watch for fever, redness in throat, SOB. It is OK to try heartburn medication. It is OK to use advil or tylenol. Pt then stated he just burped and had the burning sensation. Wife had tums in purse and pt took one.

## 2014-03-02 ENCOUNTER — Other Ambulatory Visit: Payer: Self-pay | Admitting: *Deleted

## 2014-03-02 ENCOUNTER — Telehealth: Payer: Self-pay | Admitting: *Deleted

## 2014-03-02 DIAGNOSIS — C9002 Multiple myeloma in relapse: Secondary | ICD-10-CM

## 2014-03-02 NOTE — Telephone Encounter (Signed)
Spoke with pt for post chemo follow up call.  Pt stated he had one episode of coughing this am with yellow mucus noted.  Pt also c/o sore throat.  Asked pt to check temp -  Temp  96.2  Per pt.  Pt denied shaking chills.   Pt stated he has appt with primary today at 1pm.  Stated appetite good, and drinking lots of fluids as tolerated.  Denied nausea/vomiting.  Stated has slight pain in back when bending down.  Pt has pain meds available but tried not to take it unless pain increased.  Instructed pt to take pain meds as prescribed but also monitor for constipation.   Gave pt instructions on dietary intake - prune and prune juice, applesauce, activia yogurt to help with constipation.  Pt stated he also takes Miralax with results.  Stated bladder functions fine. Pt stated he had good experience with this new cycle of chemo.  Staff were courteous, and provide pt with explanations throughout treatment.  No other concerns at this time.  Pt also aware of next return appts.

## 2014-03-03 LAB — IMMUNOFIXATION ELECTROPHORESIS
IGG (IMMUNOGLOBIN G), SERUM: 422 mg/dL — AB (ref 650–1600)
IgA: 17 mg/dL — ABNORMAL LOW (ref 68–379)
TOTAL PROTEIN, SERUM ELECTROPHOR: 5.5 g/dL — AB (ref 6.0–8.3)

## 2014-03-03 LAB — KAPPA/LAMBDA LIGHT CHAINS
Kappa free light chain: 0.11 mg/dL — ABNORMAL LOW (ref 0.33–1.94)
Kappa:Lambda Ratio: 0 — ABNORMAL LOW (ref 0.26–1.65)
Lambda Free Lght Chn: 295 mg/dL — ABNORMAL HIGH (ref 0.57–2.63)

## 2014-03-06 IMAGING — CR DG CHEST 2V
2 series · 2 of 2 positions shown · non-contrast
Comparison: Chest x-ray 12/17/2013.  Chest CT 03/31/2011.

CLINICAL DATA: Pneumonia.

EXAM:
CHEST  2 VIEW

[w chest pa]
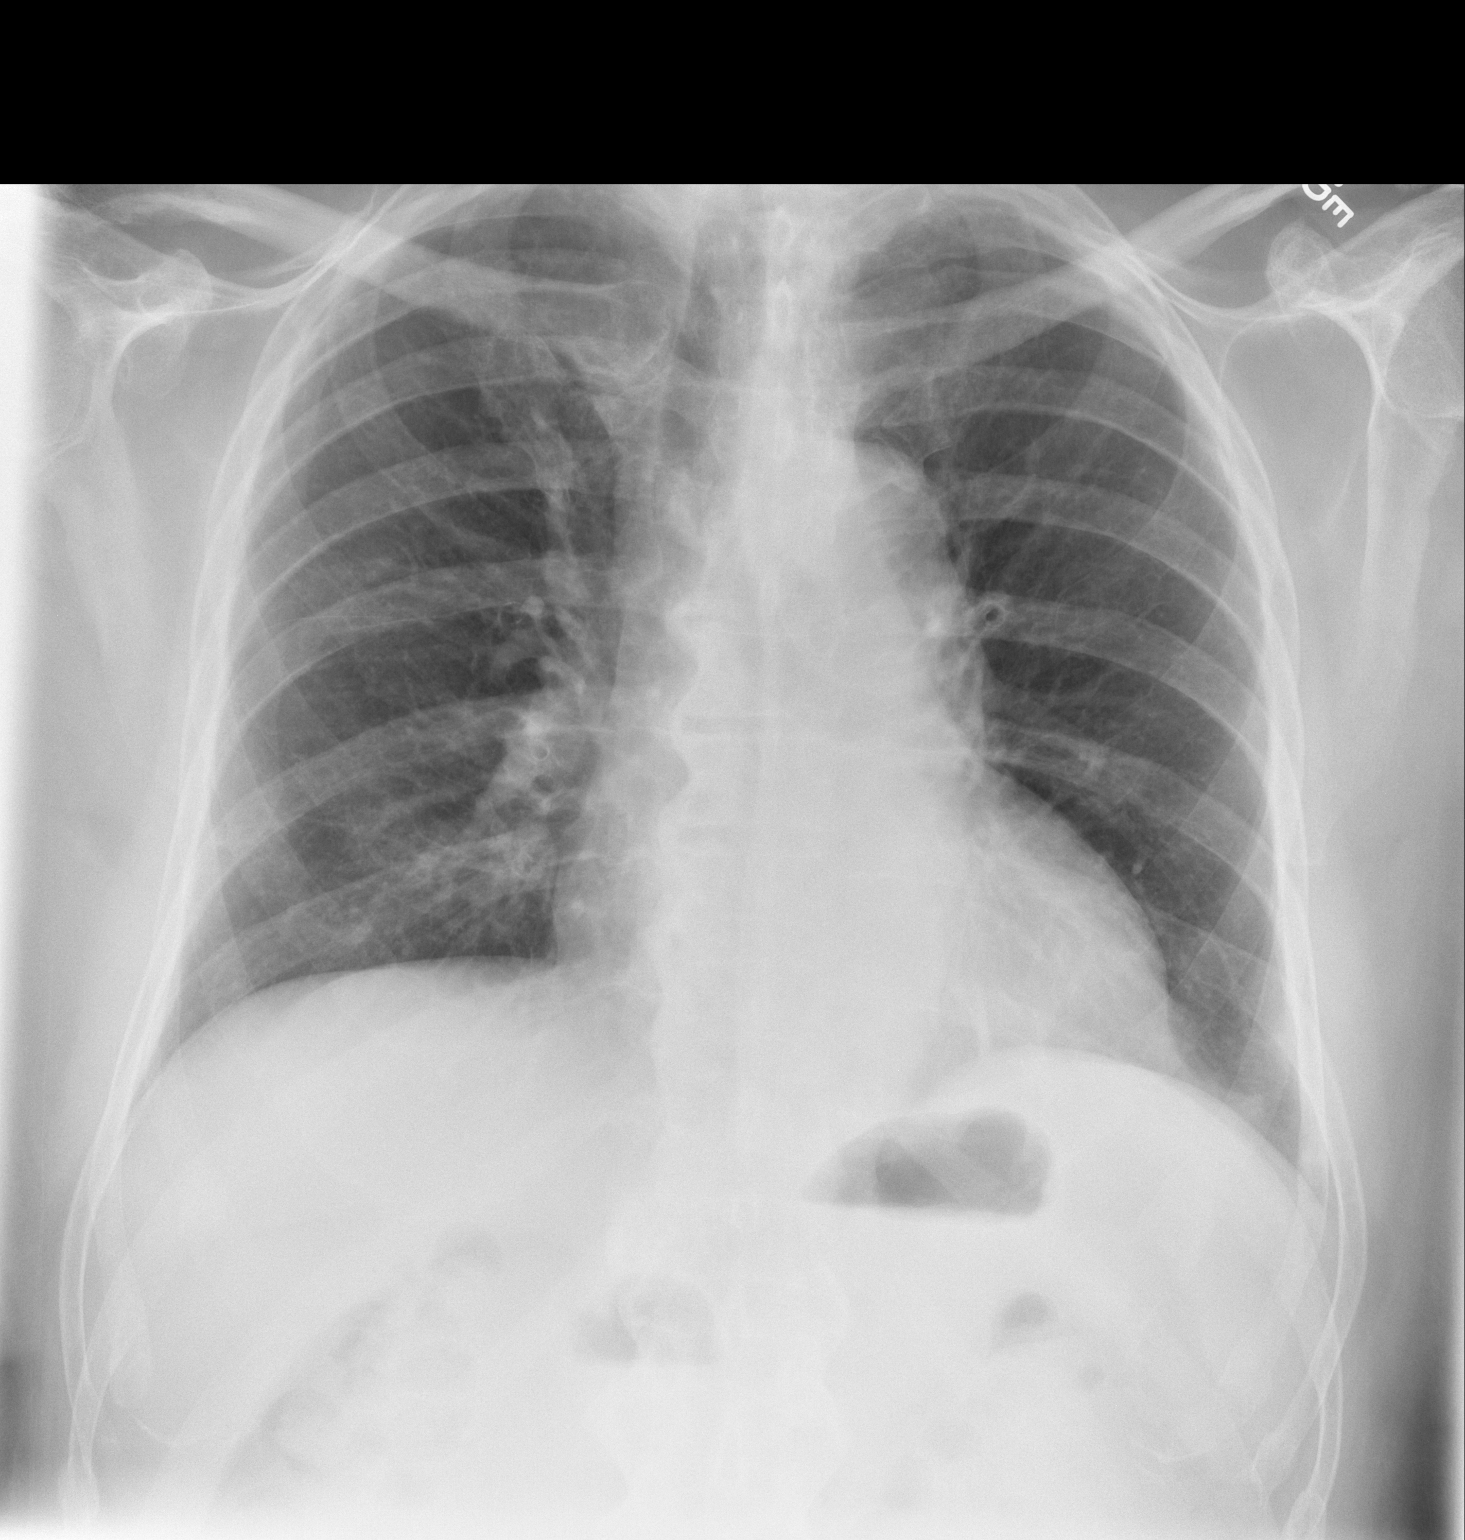

[w chest lat]
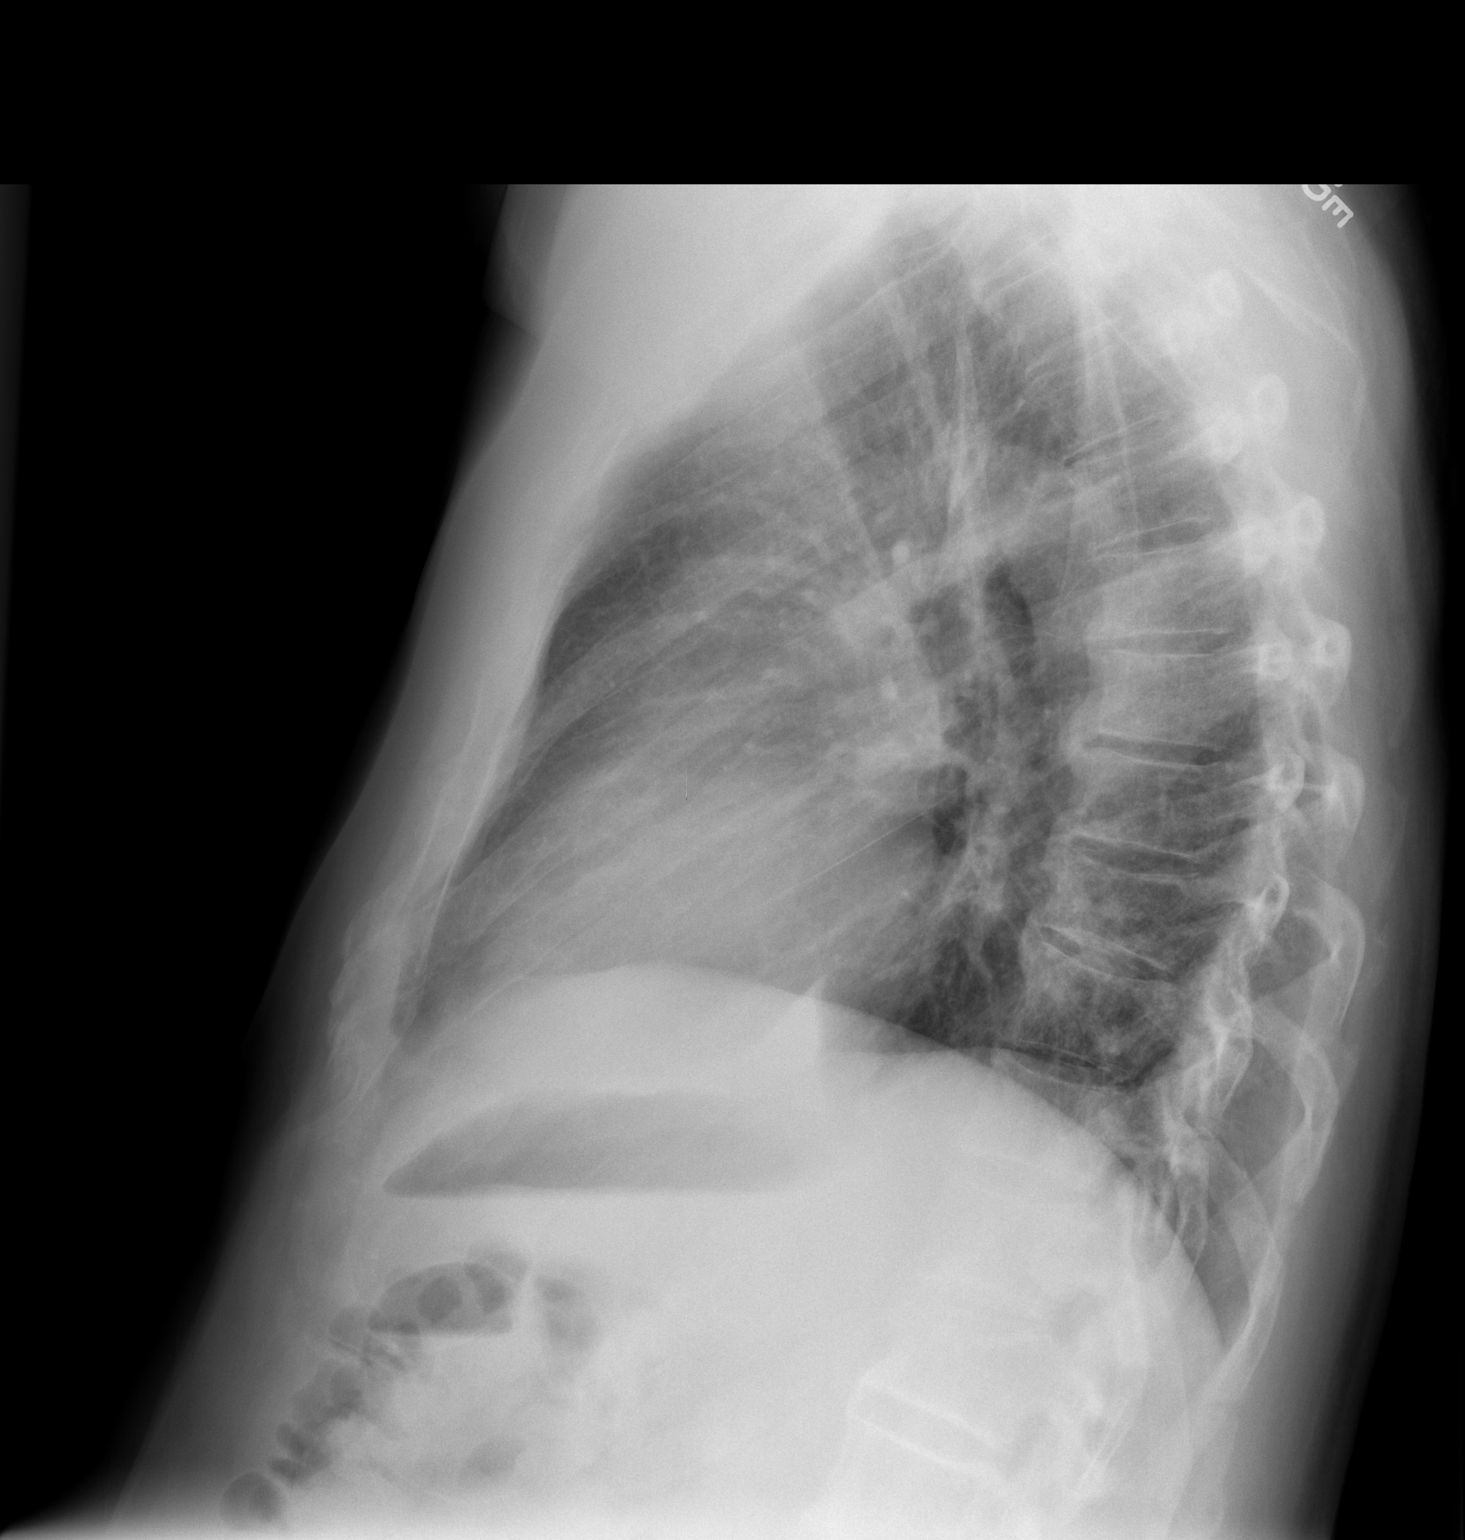

[2 of 2 positions shown; findings below may reference images not displayed]

FINDINGS: Mediastinum and hilar structures normal. The lungs are clear of
acute infiltrates. Poor inspiration with mild basilar atelectasis.
Stable cardiomegaly. No CHF. Degenerative changes thoracic spine.
Mild lower thoracic vertebral body compression fractures, unchanged
from prior CT .
IMPRESSION: Poor inspiration with mild basilar atelectasis. Stable cardiomegaly.
Chest is stable from prior study .

## 2014-03-07 ENCOUNTER — Other Ambulatory Visit (HOSPITAL_BASED_OUTPATIENT_CLINIC_OR_DEPARTMENT_OTHER): Payer: BLUE CROSS/BLUE SHIELD

## 2014-03-07 ENCOUNTER — Ambulatory Visit (HOSPITAL_BASED_OUTPATIENT_CLINIC_OR_DEPARTMENT_OTHER): Payer: Medicare Other

## 2014-03-07 VITALS — BP 124/64 | HR 80 | Temp 97.7°F

## 2014-03-07 DIAGNOSIS — IMO0002 Reserved for concepts with insufficient information to code with codable children: Secondary | ICD-10-CM

## 2014-03-07 DIAGNOSIS — Z5112 Encounter for antineoplastic immunotherapy: Secondary | ICD-10-CM

## 2014-03-07 DIAGNOSIS — N189 Chronic kidney disease, unspecified: Secondary | ICD-10-CM

## 2014-03-07 DIAGNOSIS — C9002 Multiple myeloma in relapse: Secondary | ICD-10-CM

## 2014-03-07 DIAGNOSIS — B029 Zoster without complications: Secondary | ICD-10-CM

## 2014-03-07 LAB — COMPREHENSIVE METABOLIC PANEL (CC13)
ALBUMIN: 3.3 g/dL — AB (ref 3.5–5.0)
ALK PHOS: 112 U/L (ref 40–150)
ALT: <6 U/L (ref 0–55)
ANION GAP: 7 meq/L (ref 3–11)
AST: 15 U/L (ref 5–34)
BUN: 12.1 mg/dL (ref 7.0–26.0)
CALCIUM: 8.5 mg/dL (ref 8.4–10.4)
CHLORIDE: 107 meq/L (ref 98–109)
CO2: 25 meq/L (ref 22–29)
Creatinine: 1.3 mg/dL (ref 0.7–1.3)
Glucose: 207 mg/dl — ABNORMAL HIGH (ref 70–140)
POTASSIUM: 3.7 meq/L (ref 3.5–5.1)
SODIUM: 140 meq/L (ref 136–145)
TOTAL PROTEIN: 5.5 g/dL — AB (ref 6.4–8.3)
Total Bilirubin: 0.73 mg/dL (ref 0.20–1.20)

## 2014-03-07 LAB — CBC WITH DIFFERENTIAL/PLATELET
BASO%: 0.5 % (ref 0.0–2.0)
Basophils Absolute: 0 10*3/uL (ref 0.0–0.1)
EOS ABS: 0 10*3/uL (ref 0.0–0.5)
EOS%: 0.7 % (ref 0.0–7.0)
HCT: 33.1 % — ABNORMAL LOW (ref 38.4–49.9)
HEMOGLOBIN: 11.4 g/dL — AB (ref 13.0–17.1)
LYMPH#: 1.3 10*3/uL (ref 0.9–3.3)
LYMPH%: 30.5 % (ref 14.0–49.0)
MCH: 32.9 pg (ref 27.2–33.4)
MCHC: 34.4 g/dL (ref 32.0–36.0)
MCV: 95.7 fL (ref 79.3–98.0)
MONO#: 0.7 10*3/uL (ref 0.1–0.9)
MONO%: 16.3 % — ABNORMAL HIGH (ref 0.0–14.0)
NEUT%: 52 % (ref 39.0–75.0)
NEUTROS ABS: 2.1 10*3/uL (ref 1.5–6.5)
Platelets: 82 10*3/uL — ABNORMAL LOW (ref 140–400)
RBC: 3.46 10*6/uL — ABNORMAL LOW (ref 4.20–5.82)
RDW: 14.5 % (ref 11.0–14.6)
WBC: 4.1 10*3/uL (ref 4.0–10.3)
nRBC: 0 % (ref 0–0)

## 2014-03-07 MED ORDER — DEXAMETHASONE SODIUM PHOSPHATE 10 MG/ML IJ SOLN
10.0000 mg | Freq: Once | INTRAMUSCULAR | Status: AC
  Administered 2014-03-07: 10 mg via INTRAVENOUS

## 2014-03-07 MED ORDER — DEXTROSE 5 % IV SOLN
20.0000 mg/m2 | Freq: Once | INTRAVENOUS | Status: AC
  Administered 2014-03-07: 46 mg via INTRAVENOUS
  Filled 2014-03-07: qty 23

## 2014-03-07 MED ORDER — ONDANSETRON 8 MG/50ML IVPB (CHCC)
8.0000 mg | Freq: Once | INTRAVENOUS | Status: AC
  Administered 2014-03-07: 8 mg via INTRAVENOUS

## 2014-03-07 MED ORDER — HEPARIN SOD (PORK) LOCK FLUSH 100 UNIT/ML IV SOLN
500.0000 [IU] | Freq: Once | INTRAVENOUS | Status: AC | PRN
  Administered 2014-03-07: 500 [IU]
  Filled 2014-03-07: qty 5

## 2014-03-07 MED ORDER — ONDANSETRON 8 MG/NS 50 ML IVPB
INTRAVENOUS | Status: AC
  Filled 2014-03-07: qty 8

## 2014-03-07 MED ORDER — SODIUM CHLORIDE 0.9 % IV SOLN
Freq: Once | INTRAVENOUS | Status: AC
  Administered 2014-03-07: 11:00:00 via INTRAVENOUS

## 2014-03-07 MED ORDER — DEXAMETHASONE SODIUM PHOSPHATE 10 MG/ML IJ SOLN
INTRAMUSCULAR | Status: AC
  Filled 2014-03-07: qty 1

## 2014-03-07 MED ORDER — SODIUM CHLORIDE 0.9 % IV SOLN
Freq: Once | INTRAVENOUS | Status: AC
  Administered 2014-03-07: 12:00:00 via INTRAVENOUS

## 2014-03-07 MED ORDER — SODIUM CHLORIDE 0.9 % IJ SOLN
10.0000 mL | INTRAMUSCULAR | Status: DC | PRN
  Administered 2014-03-07: 10 mL
  Filled 2014-03-07: qty 10

## 2014-03-07 NOTE — Progress Notes (Signed)
1110-OK to treat today with a platelet count of 82 per Dr. Beryle Beams.

## 2014-03-07 NOTE — Patient Instructions (Signed)
[facility] Discharge Instructions for Patients Receiving Chemotherapy  Today you received the following chemotherapy agents Kyprolis To help prevent nausea and vomiting after your treatment, we encourage you to take your nausea medication as prescribed.  If you develop nausea and vomiting that is not controlled by your nausea medication, call the clinic.   BELOW ARE SYMPTOMS THAT SHOULD BE REPORTED IMMEDIATELY:  *FEVER GREATER THAN 100.5 F  *CHILLS WITH OR WITHOUT FEVER  NAUSEA AND VOMITING THAT IS NOT CONTROLLED WITH YOUR NAUSEA MEDICATION  *UNUSUAL SHORTNESS OF BREATH  *UNUSUAL BRUISING OR BLEEDING  TENDERNESS IN MOUTH AND THROAT WITH OR WITHOUT PRESENCE OF ULCERS  *URINARY PROBLEMS  *BOWEL PROBLEMS  UNUSUAL RASH Items with * indicate a potential emergency and should be followed up as soon as possible.  Feel free to call the clinic you have any questions or concerns. The clinic phone number is [phone].    

## 2014-03-08 ENCOUNTER — Ambulatory Visit (HOSPITAL_BASED_OUTPATIENT_CLINIC_OR_DEPARTMENT_OTHER): Payer: Medicare Other

## 2014-03-08 VITALS — BP 156/79 | HR 81 | Temp 97.9°F | Resp 18

## 2014-03-08 DIAGNOSIS — N189 Chronic kidney disease, unspecified: Secondary | ICD-10-CM

## 2014-03-08 DIAGNOSIS — IMO0002 Reserved for concepts with insufficient information to code with codable children: Secondary | ICD-10-CM

## 2014-03-08 DIAGNOSIS — Z5112 Encounter for antineoplastic immunotherapy: Secondary | ICD-10-CM

## 2014-03-08 DIAGNOSIS — C9002 Multiple myeloma in relapse: Secondary | ICD-10-CM

## 2014-03-08 DIAGNOSIS — B029 Zoster without complications: Secondary | ICD-10-CM

## 2014-03-08 MED ORDER — ONDANSETRON 8 MG/50ML IVPB (CHCC)
8.0000 mg | Freq: Once | INTRAVENOUS | Status: AC
  Administered 2014-03-08: 8 mg via INTRAVENOUS

## 2014-03-08 MED ORDER — DEXTROSE 5 % IV SOLN
20.0000 mg/m2 | Freq: Once | INTRAVENOUS | Status: AC
  Administered 2014-03-08: 46 mg via INTRAVENOUS
  Filled 2014-03-08: qty 23

## 2014-03-08 MED ORDER — SODIUM CHLORIDE 0.9 % IJ SOLN
10.0000 mL | INTRAMUSCULAR | Status: DC | PRN
  Administered 2014-03-08: 10 mL
  Filled 2014-03-08: qty 10

## 2014-03-08 MED ORDER — SODIUM CHLORIDE 0.9 % IV SOLN
Freq: Once | INTRAVENOUS | Status: AC
  Administered 2014-03-08: 11:00:00 via INTRAVENOUS

## 2014-03-08 MED ORDER — DEXAMETHASONE SODIUM PHOSPHATE 10 MG/ML IJ SOLN
INTRAMUSCULAR | Status: AC
  Filled 2014-03-08: qty 1

## 2014-03-08 MED ORDER — HEPARIN SOD (PORK) LOCK FLUSH 100 UNIT/ML IV SOLN
500.0000 [IU] | Freq: Once | INTRAVENOUS | Status: AC | PRN
  Administered 2014-03-08: 500 [IU]
  Filled 2014-03-08: qty 5

## 2014-03-08 MED ORDER — DEXAMETHASONE SODIUM PHOSPHATE 10 MG/ML IJ SOLN
10.0000 mg | Freq: Once | INTRAMUSCULAR | Status: AC
  Administered 2014-03-08: 10 mg via INTRAVENOUS

## 2014-03-08 MED ORDER — ONDANSETRON 8 MG/NS 50 ML IVPB
INTRAVENOUS | Status: AC
  Filled 2014-03-08: qty 8

## 2014-03-08 NOTE — Patient Instructions (Signed)
[facility] Discharge Instructions for Patients Receiving Chemotherapy  Today you received the following chemotherapy agents Kyprolis.  To help prevent nausea and vomiting after your treatment, we encourage you to take your nausea medication.   If you develop nausea and vomiting that is not controlled by your nausea medication, call the clinic.   BELOW ARE SYMPTOMS THAT SHOULD BE REPORTED IMMEDIATELY:  *FEVER GREATER THAN 100.5 F  *CHILLS WITH OR WITHOUT FEVER  NAUSEA AND VOMITING THAT IS NOT CONTROLLED WITH YOUR NAUSEA MEDICATION  *UNUSUAL SHORTNESS OF BREATH  *UNUSUAL BRUISING OR BLEEDING  TENDERNESS IN MOUTH AND THROAT WITH OR WITHOUT PRESENCE OF ULCERS  *URINARY PROBLEMS  *BOWEL PROBLEMS  UNUSUAL RASH Items with * indicate a potential emergency and should be followed up as soon as possible.  Feel free to call the clinic you have any questions or concerns. The clinic phone number is [phone].    

## 2014-03-10 ENCOUNTER — Other Ambulatory Visit: Payer: Self-pay | Admitting: Oncology

## 2014-03-10 ENCOUNTER — Telehealth: Payer: Self-pay | Admitting: *Deleted

## 2014-03-10 ENCOUNTER — Ambulatory Visit (HOSPITAL_BASED_OUTPATIENT_CLINIC_OR_DEPARTMENT_OTHER): Payer: Medicare Other | Admitting: Oncology

## 2014-03-10 ENCOUNTER — Telehealth: Payer: Self-pay | Admitting: Oncology

## 2014-03-10 ENCOUNTER — Other Ambulatory Visit: Payer: BLUE CROSS/BLUE SHIELD

## 2014-03-10 VITALS — BP 101/54 | HR 90 | Temp 97.9°F | Resp 18 | Ht 70.0 in | Wt 224.1 lb

## 2014-03-10 DIAGNOSIS — J029 Acute pharyngitis, unspecified: Secondary | ICD-10-CM

## 2014-03-10 DIAGNOSIS — Z86718 Personal history of other venous thrombosis and embolism: Secondary | ICD-10-CM

## 2014-03-10 DIAGNOSIS — G20A1 Parkinson's disease without dyskinesia, without mention of fluctuations: Secondary | ICD-10-CM

## 2014-03-10 DIAGNOSIS — C9002 Multiple myeloma in relapse: Secondary | ICD-10-CM

## 2014-03-10 DIAGNOSIS — R350 Frequency of micturition: Secondary | ICD-10-CM

## 2014-03-10 DIAGNOSIS — T451X5A Adverse effect of antineoplastic and immunosuppressive drugs, initial encounter: Secondary | ICD-10-CM

## 2014-03-10 DIAGNOSIS — G20 Parkinson's disease: Secondary | ICD-10-CM

## 2014-03-10 DIAGNOSIS — I129 Hypertensive chronic kidney disease with stage 1 through stage 4 chronic kidney disease, or unspecified chronic kidney disease: Secondary | ICD-10-CM

## 2014-03-10 DIAGNOSIS — E1142 Type 2 diabetes mellitus with diabetic polyneuropathy: Secondary | ICD-10-CM

## 2014-03-10 DIAGNOSIS — E785 Hyperlipidemia, unspecified: Secondary | ICD-10-CM

## 2014-03-10 DIAGNOSIS — I82409 Acute embolism and thrombosis of unspecified deep veins of unspecified lower extremity: Secondary | ICD-10-CM

## 2014-03-10 DIAGNOSIS — IMO0002 Reserved for concepts with insufficient information to code with codable children: Secondary | ICD-10-CM

## 2014-03-10 DIAGNOSIS — N189 Chronic kidney disease, unspecified: Secondary | ICD-10-CM

## 2014-03-10 DIAGNOSIS — I10 Essential (primary) hypertension: Secondary | ICD-10-CM

## 2014-03-10 NOTE — Telephone Encounter (Signed)
Per staff message and POF I have scheduled appts.  JMW  

## 2014-03-10 NOTE — Progress Notes (Signed)
Hematology and Oncology Follow Up Visit  [name] [name] [id number] 14-Nov-1941 73 y.o. 03/10/2014 4:19 PM   Principle Diagnosis: Encounter Diagnoses  Name Primary?  Marland Kitchen DVT of lower extremity (deep venous thrombosis) Yes  . Peripheral neuropathy, secondary to drugs or chemicals   . Pharyngitis, acute   . Multiple myeloma in relapse      Interim History:  Short interval followup visit for this 73 year old man with relapsed kappa light chain multiple myeloma to assess tolerance to recently started salvage chemotherapy regimen with kyprolis. He is tolerating the drug well. No nausea or other GI side effects. No increase in chronic distal paresthesias. So far he has only had 4/6 planned doses for the first month but is showing good bone marrow tolerance. I have been treating despite chronic baseline platelet count of 80,000 without any dose reduction. In fact I'm going to escalate to 27 mg per meter squared going into his next cycle. Only new complaint today is urinary frequency every 10 minutes all night long. He has also had intermittent throat pain without any cough or fever.  Medications: reviewed  Allergies: No Known Allergies  Review of Systems: Hematology:  No bleeding or bruising ENT ROS: See above Breast ROS:  Respiratory ROS: No dyspnea Cardiovascular ROS:  No chest pain or palpitations Gastrointestinal ROS: No abdominal pain or change in bowel habit   Genito-Urinary ROS: See above Musculoskeletal ROS: Chronic, intermittent, right flank/rib pain, no new areas of pain Neurological ROS: No headache or change in vision Dermatological ROS: No rash or ecchymosis Remaining ROS negative:   Physical Exam: Blood pressure 101/54, pulse 90, temperature 97.9 F (36.6 C), temperature source Oral, resp. rate 18, height 5' 10"  (1.778 m), weight 224 lb 1.6 oz (101.651 kg). Wt Readings from Last 3 Encounters:  03/10/14 224 lb 1.6 oz (101.651 kg)  02/13/14 225 lb 3.2 oz (102.15 kg)   01/19/14 225 lb 4 oz (102.173 kg)     General appearance: Well-nourished African American man HENNT: Pharynx no erythema, exudate, mass, or ulcer. No thyromegaly or thyroid nodules Lymph nodes: No cervical, supraclavicular, or axillary lymphadenopathy Breasts:  Lungs: Clear to auscultation, resonant to percussion throughout Heart: Regular rhythm, no murmur, no gallop, no rub, no click, no edema Abdomen: Soft, nontender, normal bowel sounds, no mass, no organomegaly Extremities: 1+ edema, no calf tenderness Musculoskeletal: no joint deformities GU:  Vascular: Carotid pulses 2+, no bruits, Neurologic: Alert, oriented, PERRLA,  cranial nerves grossly normal, motor strength 5 over 5, reflexes 1+ symmetric, upper body coordination normal, gait normal, minimal decreased vibration sensation over the fingertips Skin: No rash or ecchymosis  Lab Results: CBC W/Diff    Component Value Date/Time   WBC 4.1 03/07/2014 1041   WBC 3.3* 02/22/2014 1145   WBC 5.6 06/13/2009 1503   RBC 3.46* 03/07/2014 1041   RBC 3.73* 02/22/2014 1145   HGB 11.4* 03/07/2014 1041   HGB 12.5* 02/22/2014 1145   HGB 9.0* 06/13/2009 1503   HCT 33.1* 03/07/2014 1041   HCT 35.9* 02/22/2014 1145   HCT 25.6* 06/13/2009 1503   PLT 82* 03/07/2014 1041   PLT 77* 02/22/2014 1145   PLT 189 06/13/2009 1503   MCV 95.7 03/07/2014 1041   MCV 96.2 02/22/2014 1145   MCV 90 06/13/2009 1503   MCH 32.9 03/07/2014 1041   MCH 33.5 02/22/2014 1145   MCH 31.4 06/13/2009 1503   MCHC 34.4 03/07/2014 1041   MCHC 34.8 02/22/2014 1145   MCHC 35.0 06/13/2009 1503   RDW  14.5 03/07/2014 1041   RDW 15.0 02/22/2014 1145   RDW 15.2* 06/13/2009 1503   LYMPHSABS 1.3 03/07/2014 1041   LYMPHSABS 1.1 02/22/2014 1145   LYMPHSABS 1.5 06/13/2009 1503   MONOABS 0.7 03/07/2014 1041   MONOABS 0.5 02/22/2014 1145   EOSABS 0.0 03/07/2014 1041   EOSABS 0.1 02/22/2014 1145   EOSABS 0.1 06/13/2009 1503   BASOSABS 0.0 03/07/2014 1041   BASOSABS 0.0 02/22/2014 1145   BASOSABS 0.0 06/13/2009 1503      Chemistry      Component Value Date/Time   NA 140 03/07/2014 1041   NA 138 12/16/2013 1350   NA 145 06/13/2009 1503   K 3.7 03/07/2014 1041   K 3.6 12/16/2013 1350   K 4.8* 06/13/2009 1503   CL 104 12/16/2013 1350   CL 104 05/18/2013 1113   CL 108 06/13/2009 1503   CO2 25 03/07/2014 1041   CO2 24 12/16/2013 1350   CO2 29 06/13/2009 1503   BUN 12.1 03/07/2014 1041   BUN 10 12/16/2013 1350   BUN 49* 06/13/2009 1503   CREATININE 1.3 03/07/2014 1041   CREATININE 1.28 12/16/2013 1350   CREATININE 1.33 07/22/2012 1334   CREATININE 2.9* 06/13/2009 1503      Component Value Date/Time   CALCIUM 8.5 03/07/2014 1041   CALCIUM 7.7* 12/16/2013 1350   CALCIUM 10.5* 06/13/2009 1503   ALKPHOS 112 03/07/2014 1041   ALKPHOS 109 12/16/2013 1350   ALKPHOS 135* 06/13/2009 1503   AST 15 03/07/2014 1041   AST 26 12/16/2013 1350   AST 21 06/13/2009 1503   ALT <6 03/07/2014 1041   ALT <5 12/16/2013 1350   ALT 25 06/13/2009 1503   BILITOT 0.73 03/07/2014 1041   BILITOT 0.5 12/16/2013 1350   BILITOT 0.60 06/13/2009 1503       Radiological Studies: Ir Fluoro Guide Cv Line Right   Impression:  Relapsed lambda light chain multiple myeloma.   He was initially diagnosed in June 2010.He had a heavy myeloma burden at time of diagnosis with multiple lytic bone lesions, 87% plasma cells in the bone marrow, and 6.7 g of protein in the urine. 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 He went on to receive high-dose IV melphalan with autologous stem cell support at [facility] 03/12/2010. We began to see small amounts of monoclonal lambda free light chains again in his urine in March of 2012. He was started on Revlimid 10 mg daily beginning in April 2012. Dose had to be adjusted due to myelosuppression down to 5 mg. At time of a visit here in December 2013, he was complaining of recurrent pain in his shoulders, bilateral ribs, and low  back. Lab showed rise in his serum and urine paraprotein levels. He was restaged with a bone marrow biopsy on 12/31/2012 which showed 10% plasma cells, rise in total urine protein from 260 mg to 1039 mg, and rise in serum free lambda light chains from 4.8 mg percent in July 2013,to 10 mg percent on October 29, to a 25.8 mg percent by December 16, to 58.5 mg percent by 01/07/2013.  I started him on pomalidomide on 01/28/2013. Initial dose 3 mg 21 days on 7 days rest.  He tolerated the drug well. I was cautiously optimistic that we were starting to see a response.  Unfortunately, at time of his visit here on August 12 he appeared to be breaking through  the pomalidomide. Although his hematologic profile remained stable, there was a progressive rise in the lambda free light chains up to 116 mg percent as of 07/26/2013 compared with 58 on May 28. 24 hour urine total protein back up to 6645 mg.  After a lengthy discussion with respect to treatment options, I recommended a trial of Bendamustine plus prednisone. This was started on September 4. He had 6 cycles through  02/01/2014. He appeared to have a initial improvement with fall in his serum lambda free light chains from 116 mg percent down to 91 by November 2014, fall in 24 hour urine protein from 6.6 g down to 5.4, fall in his creatinine from 1.4 down to 1.1. Unfortunately, recent reevaluation done on February 11 shows rise in the serum light chains up to 261 mg percent, urine 24-hour protein up to 14.4 g, and creatinine up to 1.3. Hematologic profile has remained relatively stable with hemoglobin in the 12-13 g range and platelets in the 100-120,000 range when he recovers from the nadir of the chemotherapy cycle. He was just started on a trial of kyprolis beginning on 02/28/2014. Too early to assess response.  Plan: Continue trial of kyprolis We should be able to start Elotuzumab here soon once we get IRB approval for use if his disease does not respond to  Kyprolis.    #2. Type 2 diabetes. Exacerbated by steroids. He continues metformin. He is also utilizing insulin. He is followed by Dr. Delfina Redwood.  #3. Right lower extremity deep vein thrombosis 05/14/2011. Coumadin anticoagulation discontinued 09/08/2013.  #4. 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 He continues Lyrica.  #5. Essential hypertension.  #6. Parkinson's disease.  #7. Recent bronchitis with brief hospital admission. Late December 2014.  #8. Hyperlipidemia.  #9. Degenerative arthritis.  #10. Chronic renal insufficiency.  #11. 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  #12. Hospitalization 12/15/2013 through 12/21/2013 with influenza A, possible pneumonia. #13. Sores" at the rectum and penis. Likely herpetic. January 2015 #13. Urinary frequency. I suspect this may be due to prostatic hypertrophy. I would consider starting him on Proscar but his blood pressure is very low today. I told him to check with his primary care physician.  I will transition his care to Dr. Alvy Bimler  CC: Patient Care Team: Kandice Hams, MD as PCP - General (Internal Medicine) Annia Belt, MD as Consulting Physician (Oncology) Annia Belt as Consulting Physician (Internal Medicine)   Annia Belt, MD 3/20/20154:19 PM

## 2014-03-10 NOTE — Telephone Encounter (Signed)
gave pt appt for lab and MD for MArch and April , emailed michelle regarding chemo for  April 2015

## 2014-03-11 ENCOUNTER — Telehealth: Payer: Self-pay | Admitting: Hematology and Oncology

## 2014-03-11 NOTE — Telephone Encounter (Signed)
Talked to pt's wife and gave her appt for labMd and chemo for MArch and April 2015

## 2014-03-13 ENCOUNTER — Other Ambulatory Visit: Payer: Self-pay | Admitting: Oncology

## 2014-03-14 ENCOUNTER — Ambulatory Visit (HOSPITAL_BASED_OUTPATIENT_CLINIC_OR_DEPARTMENT_OTHER): Payer: Medicare Other

## 2014-03-14 ENCOUNTER — Telehealth: Payer: Self-pay | Admitting: *Deleted

## 2014-03-14 ENCOUNTER — Other Ambulatory Visit (HOSPITAL_BASED_OUTPATIENT_CLINIC_OR_DEPARTMENT_OTHER): Payer: BLUE CROSS/BLUE SHIELD

## 2014-03-14 ENCOUNTER — Other Ambulatory Visit: Payer: Self-pay | Admitting: Neurology

## 2014-03-14 VITALS — BP 135/78 | HR 78 | Temp 97.3°F | Resp 19

## 2014-03-14 DIAGNOSIS — Z5112 Encounter for antineoplastic immunotherapy: Secondary | ICD-10-CM

## 2014-03-14 DIAGNOSIS — N189 Chronic kidney disease, unspecified: Secondary | ICD-10-CM

## 2014-03-14 DIAGNOSIS — B029 Zoster without complications: Secondary | ICD-10-CM

## 2014-03-14 DIAGNOSIS — C9002 Multiple myeloma in relapse: Secondary | ICD-10-CM

## 2014-03-14 DIAGNOSIS — IMO0002 Reserved for concepts with insufficient information to code with codable children: Secondary | ICD-10-CM

## 2014-03-14 LAB — CBC WITH DIFFERENTIAL/PLATELET
BASO%: 0.4 % (ref 0.0–2.0)
Basophils Absolute: 0 10*3/uL (ref 0.0–0.1)
EOS ABS: 0 10*3/uL (ref 0.0–0.5)
EOS%: 0.8 % (ref 0.0–7.0)
HCT: 35.2 % — ABNORMAL LOW (ref 38.4–49.9)
HGB: 12.4 g/dL — ABNORMAL LOW (ref 13.0–17.1)
LYMPH%: 22.6 % (ref 14.0–49.0)
MCH: 33.8 pg — AB (ref 27.2–33.4)
MCHC: 35.2 g/dL (ref 32.0–36.0)
MCV: 95.9 fL (ref 79.3–98.0)
MONO#: 0.7 10*3/uL (ref 0.1–0.9)
MONO%: 13.7 % (ref 0.0–14.0)
NEUT#: 3.1 10*3/uL (ref 1.5–6.5)
NEUT%: 62.5 % (ref 39.0–75.0)
PLATELETS: 83 10*3/uL — AB (ref 140–400)
RBC: 3.67 10*6/uL — AB (ref 4.20–5.82)
RDW: 14.7 % — ABNORMAL HIGH (ref 11.0–14.6)
WBC: 5 10*3/uL (ref 4.0–10.3)
lymph#: 1.1 10*3/uL (ref 0.9–3.3)

## 2014-03-14 MED ORDER — DEXTROSE 5 % IV SOLN
20.0000 mg/m2 | Freq: Once | INTRAVENOUS | Status: AC
  Administered 2014-03-14: 46 mg via INTRAVENOUS
  Filled 2014-03-14: qty 23

## 2014-03-14 MED ORDER — DEXAMETHASONE SODIUM PHOSPHATE 10 MG/ML IJ SOLN
INTRAMUSCULAR | Status: AC
  Filled 2014-03-14: qty 1

## 2014-03-14 MED ORDER — ONDANSETRON 8 MG/50ML IVPB (CHCC)
8.0000 mg | Freq: Once | INTRAVENOUS | Status: AC
  Administered 2014-03-14: 8 mg via INTRAVENOUS

## 2014-03-14 MED ORDER — DEXAMETHASONE SODIUM PHOSPHATE 10 MG/ML IJ SOLN
10.0000 mg | Freq: Once | INTRAMUSCULAR | Status: AC
  Administered 2014-03-14: 10 mg via INTRAVENOUS

## 2014-03-14 MED ORDER — HEPARIN SOD (PORK) LOCK FLUSH 100 UNIT/ML IV SOLN
500.0000 [IU] | Freq: Once | INTRAVENOUS | Status: AC | PRN
Start: 2014-03-14 — End: 2014-03-14
  Administered 2014-03-14: 500 [IU]
  Filled 2014-03-14: qty 5

## 2014-03-14 MED ORDER — ONDANSETRON 8 MG/NS 50 ML IVPB
INTRAVENOUS | Status: AC
  Filled 2014-03-14: qty 8

## 2014-03-14 MED ORDER — SODIUM CHLORIDE 0.9 % IV SOLN
Freq: Once | INTRAVENOUS | Status: AC
  Administered 2014-03-14: 12:00:00 via INTRAVENOUS

## 2014-03-14 MED ORDER — SODIUM CHLORIDE 0.9 % IV SOLN
Freq: Once | INTRAVENOUS | Status: AC
  Administered 2014-03-14: 11:00:00 via INTRAVENOUS

## 2014-03-14 MED ORDER — SODIUM CHLORIDE 0.9 % IJ SOLN
10.0000 mL | INTRAMUSCULAR | Status: DC | PRN
Start: 2014-03-14 — End: 2014-03-14
  Administered 2014-03-14: 10 mL
  Filled 2014-03-14: qty 10

## 2014-03-14 NOTE — Telephone Encounter (Signed)
Message copied by Patton Salles on Tue Mar 14, 2014  4:17 PM ------      Message from: [facility] [place]: Tue Mar 14, 2014  3:50 PM       Since I have not seen the patient I cannot make much suggestions.      I see pain medications and pt is on lyrica.      Recommend to continue his pain medication or increase Lyrica to BID until I see him or wait until I see him      ----- Message -----         From: Prentiss Bells, RN         Sent: 03/14/2014  11:53 AM           To: Heath Lark, MD            Pt you are picking up from Dr. Darnell Level.  C/o neuropathic pain 5/10 in his feet, right worse than his left.  Wakes him up at night.  Pt is in infusion rm now.  Your 1st appt with him is 4/2.  Anything we can do to help him until then?  Thx Terri       ------

## 2014-03-14 NOTE — Telephone Encounter (Signed)
Patient notified of Dr Alvy Bimler suggestion below to increase Lyrica to twice a day until she sees him on 4/2 or to continue pain medication. Pt verbalized understanding

## 2014-03-14 NOTE — Patient Instructions (Signed)
[facility] Discharge Instructions for Patients Receiving Chemotherapy  Today you received the following chemotherapy agents: kyprolis  To help prevent nausea and vomiting after your treatment, we encourage you to take your nausea medication.  Take it as often as prescribed.     If you develop nausea and vomiting that is not controlled by your nausea medication, call the clinic. If it is after clinic hours your family physician or the after hours number for the clinic or go to the Emergency Department.   BELOW ARE SYMPTOMS THAT SHOULD BE REPORTED IMMEDIATELY:  *FEVER GREATER THAN 100.5 F  *CHILLS WITH OR WITHOUT FEVER  NAUSEA AND VOMITING THAT IS NOT CONTROLLED WITH YOUR NAUSEA MEDICATION  *UNUSUAL SHORTNESS OF BREATH  *UNUSUAL BRUISING OR BLEEDING  TENDERNESS IN MOUTH AND THROAT WITH OR WITHOUT PRESENCE OF ULCERS  *URINARY PROBLEMS  *BOWEL PROBLEMS  UNUSUAL RASH Items with * indicate a potential emergency and should be followed up as soon as possible.  Feel free to call the clinic you have any questions or concerns. The clinic phone number is [phone].   I have been informed and understand all the instructions given to me. I know to contact the clinic, my physician, or go to the Emergency Department if any problems should occur. I do not have any questions at this time, but understand that I may call the clinic during office hours   should I have any questions or need assistance in obtaining follow up care.    __________________________________________  _____________  __________ Signature of Patient or Authorized Representative            Date                   Time    __________________________________________ Nurse's Signature    

## 2014-03-14 NOTE — Progress Notes (Signed)
Ok to treat with platelets 83 per Dr Alvy Bimler

## 2014-03-15 ENCOUNTER — Ambulatory Visit (HOSPITAL_BASED_OUTPATIENT_CLINIC_OR_DEPARTMENT_OTHER): Payer: Medicare Other

## 2014-03-15 VITALS — BP 155/78 | HR 95 | Temp 97.7°F | Resp 20

## 2014-03-15 DIAGNOSIS — N189 Chronic kidney disease, unspecified: Secondary | ICD-10-CM

## 2014-03-15 DIAGNOSIS — Z5112 Encounter for antineoplastic immunotherapy: Secondary | ICD-10-CM

## 2014-03-15 DIAGNOSIS — IMO0002 Reserved for concepts with insufficient information to code with codable children: Secondary | ICD-10-CM

## 2014-03-15 DIAGNOSIS — B029 Zoster without complications: Secondary | ICD-10-CM

## 2014-03-15 DIAGNOSIS — C9002 Multiple myeloma in relapse: Secondary | ICD-10-CM

## 2014-03-15 MED ORDER — SODIUM CHLORIDE 0.9 % IJ SOLN
10.0000 mL | INTRAMUSCULAR | Status: DC | PRN
  Administered 2014-03-15: 10 mL
  Filled 2014-03-15: qty 10

## 2014-03-15 MED ORDER — SODIUM CHLORIDE 0.9 % IV SOLN
Freq: Once | INTRAVENOUS | Status: AC
  Administered 2014-03-15: 12:00:00 via INTRAVENOUS

## 2014-03-15 MED ORDER — HEPARIN SOD (PORK) LOCK FLUSH 100 UNIT/ML IV SOLN
500.0000 [IU] | Freq: Once | INTRAVENOUS | Status: AC | PRN
  Administered 2014-03-15: 500 [IU]
  Filled 2014-03-15: qty 5

## 2014-03-15 MED ORDER — CARFILZOMIB CHEMO INJECTION 60 MG
20.0000 mg/m2 | Freq: Once | INTRAVENOUS | Status: AC
  Administered 2014-03-15: 46 mg via INTRAVENOUS
  Filled 2014-03-15: qty 23

## 2014-03-15 MED ORDER — DEXAMETHASONE SODIUM PHOSPHATE 10 MG/ML IJ SOLN
INTRAMUSCULAR | Status: AC
  Filled 2014-03-15: qty 1

## 2014-03-15 MED ORDER — DEXAMETHASONE SODIUM PHOSPHATE 10 MG/ML IJ SOLN
10.0000 mg | Freq: Once | INTRAMUSCULAR | Status: AC
  Administered 2014-03-15: 10 mg via INTRAVENOUS

## 2014-03-15 MED ORDER — ONDANSETRON 8 MG/50ML IVPB (CHCC)
8.0000 mg | Freq: Once | INTRAVENOUS | Status: AC
  Administered 2014-03-15: 8 mg via INTRAVENOUS

## 2014-03-15 MED ORDER — ONDANSETRON 8 MG/NS 50 ML IVPB
INTRAVENOUS | Status: AC
  Filled 2014-03-15: qty 8

## 2014-03-15 NOTE — Patient Instructions (Signed)
[facility] Discharge Instructions for Patients Receiving Chemotherapy  Today you received the following chemotherapy agents: Kyprolis  To help prevent nausea and vomiting after your treatment, we encourage you to take your nausea medication: as directed.   If you develop nausea and vomiting that is not controlled by your nausea medication, call the clinic.   BELOW ARE SYMPTOMS THAT SHOULD BE REPORTED IMMEDIATELY:  *FEVER GREATER THAN 100.5 F  *CHILLS WITH OR WITHOUT FEVER  NAUSEA AND VOMITING THAT IS NOT CONTROLLED WITH YOUR NAUSEA MEDICATION  *UNUSUAL SHORTNESS OF BREATH  *UNUSUAL BRUISING OR BLEEDING  TENDERNESS IN MOUTH AND THROAT WITH OR WITHOUT PRESENCE OF ULCERS  *URINARY PROBLEMS  *BOWEL PROBLEMS  UNUSUAL RASH Items with * indicate a potential emergency and should be followed up as soon as possible.  Feel free to call the clinic you have any questions or concerns. The clinic phone number is [phone].    

## 2014-03-21 ENCOUNTER — Other Ambulatory Visit (HOSPITAL_BASED_OUTPATIENT_CLINIC_OR_DEPARTMENT_OTHER): Payer: Medicare Other

## 2014-03-21 DIAGNOSIS — C9002 Multiple myeloma in relapse: Secondary | ICD-10-CM

## 2014-03-21 LAB — CBC WITH DIFFERENTIAL/PLATELET
BASO%: 0.2 % (ref 0.0–2.0)
BASOS ABS: 0 10*3/uL (ref 0.0–0.1)
EOS ABS: 0 10*3/uL (ref 0.0–0.5)
EOS%: 0.7 % (ref 0.0–7.0)
HEMATOCRIT: 35.8 % — AB (ref 38.4–49.9)
HEMOGLOBIN: 12.4 g/dL — AB (ref 13.0–17.1)
LYMPH%: 18.7 % (ref 14.0–49.0)
MCH: 33.7 pg — ABNORMAL HIGH (ref 27.2–33.4)
MCHC: 34.6 g/dL (ref 32.0–36.0)
MCV: 97.3 fL (ref 79.3–98.0)
MONO#: 0.5 10*3/uL (ref 0.1–0.9)
MONO%: 10.6 % (ref 0.0–14.0)
NEUT%: 69.8 % (ref 39.0–75.0)
NEUTROS ABS: 3 10*3/uL (ref 1.5–6.5)
NRBC: 0 % (ref 0–0)
PLATELETS: 78 10*3/uL — AB (ref 140–400)
RBC: 3.68 10*6/uL — ABNORMAL LOW (ref 4.20–5.82)
RDW: 14.7 % — ABNORMAL HIGH (ref 11.0–14.6)
WBC: 4.2 10*3/uL (ref 4.0–10.3)
lymph#: 0.8 10*3/uL — ABNORMAL LOW (ref 0.9–3.3)

## 2014-03-21 LAB — TECHNOLOGIST REVIEW

## 2014-03-28 ENCOUNTER — Other Ambulatory Visit (HOSPITAL_BASED_OUTPATIENT_CLINIC_OR_DEPARTMENT_OTHER): Payer: Medicare Other

## 2014-03-28 ENCOUNTER — Ambulatory Visit (HOSPITAL_BASED_OUTPATIENT_CLINIC_OR_DEPARTMENT_OTHER): Payer: Medicare Other

## 2014-03-28 VITALS — BP 111/63 | HR 79 | Temp 97.6°F | Resp 18

## 2014-03-28 DIAGNOSIS — C9002 Multiple myeloma in relapse: Secondary | ICD-10-CM

## 2014-03-28 DIAGNOSIS — IMO0002 Reserved for concepts with insufficient information to code with codable children: Secondary | ICD-10-CM

## 2014-03-28 DIAGNOSIS — N189 Chronic kidney disease, unspecified: Secondary | ICD-10-CM

## 2014-03-28 DIAGNOSIS — B029 Zoster without complications: Secondary | ICD-10-CM

## 2014-03-28 DIAGNOSIS — Z5112 Encounter for antineoplastic immunotherapy: Secondary | ICD-10-CM

## 2014-03-28 LAB — CBC WITH DIFFERENTIAL/PLATELET
BASO%: 0.7 % (ref 0.0–2.0)
Basophils Absolute: 0 10*3/uL (ref 0.0–0.1)
EOS ABS: 0 10*3/uL (ref 0.0–0.5)
EOS%: 0.9 % (ref 0.0–7.0)
HCT: 36.2 % — ABNORMAL LOW (ref 38.4–49.9)
HGB: 12.6 g/dL — ABNORMAL LOW (ref 13.0–17.1)
LYMPH#: 1.1 10*3/uL (ref 0.9–3.3)
LYMPH%: 25.7 % (ref 14.0–49.0)
MCH: 33.9 pg — ABNORMAL HIGH (ref 27.2–33.4)
MCHC: 34.8 g/dL (ref 32.0–36.0)
MCV: 97.3 fL (ref 79.3–98.0)
MONO#: 0.6 10*3/uL (ref 0.1–0.9)
MONO%: 14.4 % — ABNORMAL HIGH (ref 0.0–14.0)
NEUT%: 58.3 % (ref 39.0–75.0)
NEUTROS ABS: 2.5 10*3/uL (ref 1.5–6.5)
NRBC: 0 % (ref 0–0)
PLATELETS: 108 10*3/uL — AB (ref 140–400)
RBC: 3.72 10*6/uL — ABNORMAL LOW (ref 4.20–5.82)
RDW: 14.5 % (ref 11.0–14.6)
WBC: 4.2 10*3/uL (ref 4.0–10.3)

## 2014-03-28 MED ORDER — DEXAMETHASONE SODIUM PHOSPHATE 10 MG/ML IJ SOLN
10.0000 mg | Freq: Once | INTRAMUSCULAR | Status: AC
  Administered 2014-03-28: 10 mg via INTRAVENOUS

## 2014-03-28 MED ORDER — ONDANSETRON 8 MG/50ML IVPB (CHCC)
8.0000 mg | Freq: Once | INTRAVENOUS | Status: AC
Start: 2014-03-28 — End: 2014-03-28
  Administered 2014-03-28: 8 mg via INTRAVENOUS

## 2014-03-28 MED ORDER — DEXAMETHASONE SODIUM PHOSPHATE 10 MG/ML IJ SOLN
INTRAMUSCULAR | Status: AC
  Filled 2014-03-28: qty 1

## 2014-03-28 MED ORDER — HEPARIN SOD (PORK) LOCK FLUSH 100 UNIT/ML IV SOLN
500.0000 [IU] | Freq: Once | INTRAVENOUS | Status: AC | PRN
  Administered 2014-03-28: 500 [IU]
  Filled 2014-03-28: qty 5

## 2014-03-28 MED ORDER — ONDANSETRON 8 MG/NS 50 ML IVPB
INTRAVENOUS | Status: AC
  Filled 2014-03-28: qty 8

## 2014-03-28 MED ORDER — SODIUM CHLORIDE 0.9 % IV SOLN
Freq: Once | INTRAVENOUS | Status: AC
  Administered 2014-03-28: 12:00:00 via INTRAVENOUS

## 2014-03-28 MED ORDER — DEXTROSE 5 % IV SOLN
27.0000 mg/m2 | Freq: Once | INTRAVENOUS | Status: AC
  Administered 2014-03-28: 60 mg via INTRAVENOUS
  Filled 2014-03-28: qty 30

## 2014-03-28 MED ORDER — SODIUM CHLORIDE 0.9 % IV SOLN: Freq: Once | INTRAVENOUS | Status: DC

## 2014-03-28 MED ORDER — SODIUM CHLORIDE 0.9 % IJ SOLN
10.0000 mL | INTRAMUSCULAR | Status: DC | PRN
  Administered 2014-03-28: 10 mL
  Filled 2014-03-28: qty 10

## 2014-03-28 NOTE — Patient Instructions (Signed)
[facility] Discharge Instructions for Patients Receiving Chemotherapy  Today you received the following chemotherapy agents:  Kyprolis  To help prevent nausea and vomiting after your treatment, we encourage you to take your nausea medication as ordered per MD.   If you develop nausea and vomiting that is not controlled by your nausea medication, call the clinic.   BELOW ARE SYMPTOMS THAT SHOULD BE REPORTED IMMEDIATELY:  *FEVER GREATER THAN 100.5 F  *CHILLS WITH OR WITHOUT FEVER  NAUSEA AND VOMITING THAT IS NOT CONTROLLED WITH YOUR NAUSEA MEDICATION  *UNUSUAL SHORTNESS OF BREATH  *UNUSUAL BRUISING OR BLEEDING  TENDERNESS IN MOUTH AND THROAT WITH OR WITHOUT PRESENCE OF ULCERS  *URINARY PROBLEMS  *BOWEL PROBLEMS  UNUSUAL RASH Items with * indicate a potential emergency and should be followed up as soon as possible.  Feel free to call the clinic you have any questions or concerns. The clinic phone number is [phone].

## 2014-03-29 ENCOUNTER — Other Ambulatory Visit: Payer: Self-pay

## 2014-03-29 ENCOUNTER — Ambulatory Visit (HOSPITAL_BASED_OUTPATIENT_CLINIC_OR_DEPARTMENT_OTHER): Payer: Medicare Other

## 2014-03-29 VITALS — BP 128/65 | HR 73 | Temp 96.9°F | Resp 18

## 2014-03-29 DIAGNOSIS — B029 Zoster without complications: Secondary | ICD-10-CM

## 2014-03-29 DIAGNOSIS — Z5112 Encounter for antineoplastic immunotherapy: Secondary | ICD-10-CM

## 2014-03-29 DIAGNOSIS — N189 Chronic kidney disease, unspecified: Secondary | ICD-10-CM

## 2014-03-29 DIAGNOSIS — C9002 Multiple myeloma in relapse: Secondary | ICD-10-CM

## 2014-03-29 DIAGNOSIS — IMO0002 Reserved for concepts with insufficient information to code with codable children: Secondary | ICD-10-CM

## 2014-03-29 MED ORDER — DEXAMETHASONE SODIUM PHOSPHATE 10 MG/ML IJ SOLN
INTRAMUSCULAR | Status: AC
  Filled 2014-03-29: qty 1

## 2014-03-29 MED ORDER — SODIUM CHLORIDE 0.9 % IJ SOLN
10.0000 mL | INTRAMUSCULAR | Status: DC | PRN
  Administered 2014-03-29: 10 mL
  Filled 2014-03-29: qty 10

## 2014-03-29 MED ORDER — HEPARIN SOD (PORK) LOCK FLUSH 100 UNIT/ML IV SOLN
500.0000 [IU] | Freq: Once | INTRAVENOUS | Status: AC | PRN
  Administered 2014-03-29: 500 [IU]
  Filled 2014-03-29: qty 5

## 2014-03-29 MED ORDER — ONDANSETRON 8 MG/NS 50 ML IVPB
INTRAVENOUS | Status: AC
  Filled 2014-03-29: qty 8

## 2014-03-29 MED ORDER — DEXAMETHASONE SODIUM PHOSPHATE 10 MG/ML IJ SOLN
10.0000 mg | Freq: Once | INTRAMUSCULAR | Status: AC
  Administered 2014-03-29: 10 mg via INTRAVENOUS

## 2014-03-29 MED ORDER — SODIUM CHLORIDE 0.9 % IV SOLN
Freq: Once | INTRAVENOUS | Status: AC
  Administered 2014-03-29: 13:00:00 via INTRAVENOUS

## 2014-03-29 MED ORDER — ONDANSETRON 8 MG/50ML IVPB (CHCC)
8.0000 mg | Freq: Once | INTRAVENOUS | Status: AC
  Administered 2014-03-29: 8 mg via INTRAVENOUS

## 2014-03-29 MED ORDER — DEXTROSE 5 % IV SOLN
27.0000 mg/m2 | Freq: Once | INTRAVENOUS | Status: AC
  Administered 2014-03-29: 60 mg via INTRAVENOUS
  Filled 2014-03-29: qty 30

## 2014-03-29 MED ORDER — SODIUM CHLORIDE 0.9 % IV SOLN
Freq: Once | INTRAVENOUS | Status: AC
  Administered 2014-03-29: 14:00:00 via INTRAVENOUS

## 2014-03-29 NOTE — Patient Instructions (Signed)
[facility] Discharge Instructions for Patients Receiving Chemotherapy  Today you received the following chemotherapy agent: Kyprolis  To help prevent nausea and vomiting after your treatment, we encourage you to take your nausea medication as prescribed.    If you develop nausea and vomiting that is not controlled by your nausea medication, call the clinic.   BELOW ARE SYMPTOMS THAT SHOULD BE REPORTED IMMEDIATELY:  *FEVER GREATER THAN 100.5 F  *CHILLS WITH OR WITHOUT FEVER  NAUSEA AND VOMITING THAT IS NOT CONTROLLED WITH YOUR NAUSEA MEDICATION  *UNUSUAL SHORTNESS OF BREATH  *UNUSUAL BRUISING OR BLEEDING  TENDERNESS IN MOUTH AND THROAT WITH OR WITHOUT PRESENCE OF ULCERS  *URINARY PROBLEMS  *BOWEL PROBLEMS  UNUSUAL RASH Items with * indicate a potential emergency and should be followed up as soon as possible.  Feel free to call the clinic you have any questions or concerns. The clinic phone number is [phone].    

## 2014-04-03 ENCOUNTER — Other Ambulatory Visit: Payer: Self-pay | Admitting: *Deleted

## 2014-04-03 ENCOUNTER — Telehealth: Payer: Self-pay | Admitting: *Deleted

## 2014-04-03 DIAGNOSIS — T451X5A Adverse effect of antineoplastic and immunosuppressive drugs, initial encounter: Principal | ICD-10-CM

## 2014-04-03 DIAGNOSIS — G620 Drug-induced polyneuropathy: Secondary | ICD-10-CM

## 2014-04-03 MED ORDER — PREGABALIN 100 MG PO CAPS: 100.0000 mg | ORAL_CAPSULE | Freq: Two times a day (BID) | ORAL | Status: DC

## 2014-04-03 NOTE — Telephone Encounter (Signed)
Wife states pt was instructed by RN, per Dr. Alvy Bimler, to increase Lyrica 100 mg to twice daily on 3/24 due to increase in neuropathic pain in feet.  He needs new Rx with this change sent to his pharmacy.  Pt scheduled to see Dr. Alvy Bimler on 4/20.

## 2014-04-03 NOTE — Telephone Encounter (Signed)
S/w pt and informed him of new rx for lyrica twice daily sent to [facility]. He verbalized understanding.

## 2014-04-03 NOTE — Telephone Encounter (Signed)
Yes, 60 tabs 1 refill

## 2014-04-04 ENCOUNTER — Other Ambulatory Visit (HOSPITAL_BASED_OUTPATIENT_CLINIC_OR_DEPARTMENT_OTHER): Payer: Medicare Other

## 2014-04-04 ENCOUNTER — Ambulatory Visit (HOSPITAL_BASED_OUTPATIENT_CLINIC_OR_DEPARTMENT_OTHER): Payer: Medicare Other

## 2014-04-04 VITALS — BP 118/62 | HR 77 | Temp 97.0°F | Resp 19

## 2014-04-04 DIAGNOSIS — Z5112 Encounter for antineoplastic immunotherapy: Secondary | ICD-10-CM

## 2014-04-04 DIAGNOSIS — B029 Zoster without complications: Secondary | ICD-10-CM

## 2014-04-04 DIAGNOSIS — N189 Chronic kidney disease, unspecified: Secondary | ICD-10-CM

## 2014-04-04 DIAGNOSIS — C9002 Multiple myeloma in relapse: Secondary | ICD-10-CM

## 2014-04-04 DIAGNOSIS — IMO0002 Reserved for concepts with insufficient information to code with codable children: Secondary | ICD-10-CM

## 2014-04-04 LAB — COMPREHENSIVE METABOLIC PANEL (CC13)
ALK PHOS: 110 U/L (ref 40–150)
ANION GAP: 7 meq/L (ref 3–11)
AST: 14 U/L (ref 5–34)
Albumin: 3.6 g/dL (ref 3.5–5.0)
BILIRUBIN TOTAL: 0.91 mg/dL (ref 0.20–1.20)
BUN: 14.1 mg/dL (ref 7.0–26.0)
CO2: 28 mEq/L (ref 22–29)
Calcium: 8.9 mg/dL (ref 8.4–10.4)
Chloride: 105 mEq/L (ref 98–109)
Creatinine: 1.2 mg/dL (ref 0.7–1.3)
Glucose: 204 mg/dl — ABNORMAL HIGH (ref 70–140)
Potassium: 4 mEq/L (ref 3.5–5.1)
SODIUM: 141 meq/L (ref 136–145)
TOTAL PROTEIN: 5.7 g/dL — AB (ref 6.4–8.3)

## 2014-04-04 LAB — CBC WITH DIFFERENTIAL/PLATELET
BASO%: 0.5 % (ref 0.0–2.0)
Basophils Absolute: 0 10*3/uL (ref 0.0–0.1)
EOS%: 1.3 % (ref 0.0–7.0)
Eosinophils Absolute: 0.1 10*3/uL (ref 0.0–0.5)
HCT: 35.1 % — ABNORMAL LOW (ref 38.4–49.9)
HGB: 12.3 g/dL — ABNORMAL LOW (ref 13.0–17.1)
LYMPH%: 20.5 % (ref 14.0–49.0)
MCH: 34 pg — ABNORMAL HIGH (ref 27.2–33.4)
MCHC: 35 g/dL (ref 32.0–36.0)
MCV: 97 fL (ref 79.3–98.0)
MONO#: 0.4 10*3/uL (ref 0.1–0.9)
MONO%: 9.4 % (ref 0.0–14.0)
NEUT#: 2.7 10*3/uL (ref 1.5–6.5)
NEUT%: 68.3 % (ref 39.0–75.0)
PLATELETS: 78 10*3/uL — AB (ref 140–400)
RBC: 3.62 10*6/uL — AB (ref 4.20–5.82)
RDW: 14.6 % (ref 11.0–14.6)
WBC: 4 10*3/uL (ref 4.0–10.3)
lymph#: 0.8 10*3/uL — ABNORMAL LOW (ref 0.9–3.3)

## 2014-04-04 MED ORDER — SODIUM CHLORIDE 0.9 % IV SOLN
Freq: Once | INTRAVENOUS | Status: AC
  Administered 2014-04-04: 16:00:00 via INTRAVENOUS

## 2014-04-04 MED ORDER — SODIUM CHLORIDE 0.9 % IJ SOLN
10.0000 mL | INTRAMUSCULAR | Status: DC | PRN
  Administered 2014-04-04: 10 mL
  Filled 2014-04-04: qty 10

## 2014-04-04 MED ORDER — SODIUM CHLORIDE 0.9 % IV SOLN
Freq: Once | INTRAVENOUS | Status: AC
  Administered 2014-04-04: 15:00:00 via INTRAVENOUS

## 2014-04-04 MED ORDER — DEXAMETHASONE SODIUM PHOSPHATE 10 MG/ML IJ SOLN
INTRAMUSCULAR | Status: AC
  Filled 2014-04-04: qty 1

## 2014-04-04 MED ORDER — ONDANSETRON 8 MG/50ML IVPB (CHCC)
8.0000 mg | Freq: Once | INTRAVENOUS | Status: AC
  Administered 2014-04-04: 8 mg via INTRAVENOUS

## 2014-04-04 MED ORDER — DEXAMETHASONE SODIUM PHOSPHATE 10 MG/ML IJ SOLN
10.0000 mg | Freq: Once | INTRAMUSCULAR | Status: AC
  Administered 2014-04-04: 10 mg via INTRAVENOUS

## 2014-04-04 MED ORDER — HEPARIN SOD (PORK) LOCK FLUSH 100 UNIT/ML IV SOLN
500.0000 [IU] | Freq: Once | INTRAVENOUS | Status: AC | PRN
  Administered 2014-04-04: 500 [IU]
  Filled 2014-04-04: qty 5

## 2014-04-04 MED ORDER — DEXTROSE 5 % IV SOLN
27.0000 mg/m2 | Freq: Once | INTRAVENOUS | Status: AC
  Administered 2014-04-04: 60 mg via INTRAVENOUS
  Filled 2014-04-04: qty 30

## 2014-04-04 MED ORDER — ONDANSETRON 8 MG/NS 50 ML IVPB
INTRAVENOUS | Status: AC
  Filled 2014-04-04: qty 8

## 2014-04-04 NOTE — Progress Notes (Signed)
Labs reviewed from 04/04/14, okay to tx w/ platelets-78 per Dr. Alvy Bimler

## 2014-04-04 NOTE — Patient Instructions (Signed)
[facility] Discharge Instructions for Patients Receiving Chemotherapy  Today you received the following chemotherapy agents: Kyprolis  To help prevent nausea and vomiting after your treatment, we encourage you to take your nausea medication: Zofran 8 mg every 8 hrs as needed.   If you develop nausea and vomiting that is not controlled by your nausea medication, call the clinic.   BELOW ARE SYMPTOMS THAT SHOULD BE REPORTED IMMEDIATELY:  *FEVER GREATER THAN 100.5 F  *CHILLS WITH OR WITHOUT FEVER  NAUSEA AND VOMITING THAT IS NOT CONTROLLED WITH YOUR NAUSEA MEDICATION  *UNUSUAL SHORTNESS OF BREATH  *UNUSUAL BRUISING OR BLEEDING  TENDERNESS IN MOUTH AND THROAT WITH OR WITHOUT PRESENCE OF ULCERS  *URINARY PROBLEMS  *BOWEL PROBLEMS  UNUSUAL RASH Items with * indicate a potential emergency and should be followed up as soon as possible.  Feel free to call the clinic you have any questions or concerns. The clinic phone number is [phone].

## 2014-04-05 ENCOUNTER — Ambulatory Visit (HOSPITAL_BASED_OUTPATIENT_CLINIC_OR_DEPARTMENT_OTHER): Payer: Medicare Other

## 2014-04-05 ENCOUNTER — Telehealth: Payer: Self-pay | Admitting: *Deleted

## 2014-04-05 VITALS — BP 148/76 | HR 82 | Temp 97.7°F

## 2014-04-05 DIAGNOSIS — C9002 Multiple myeloma in relapse: Secondary | ICD-10-CM

## 2014-04-05 DIAGNOSIS — N189 Chronic kidney disease, unspecified: Secondary | ICD-10-CM

## 2014-04-05 DIAGNOSIS — B029 Zoster without complications: Secondary | ICD-10-CM

## 2014-04-05 DIAGNOSIS — IMO0002 Reserved for concepts with insufficient information to code with codable children: Secondary | ICD-10-CM

## 2014-04-05 DIAGNOSIS — Z5112 Encounter for antineoplastic immunotherapy: Secondary | ICD-10-CM

## 2014-04-05 MED ORDER — HEPARIN SOD (PORK) LOCK FLUSH 100 UNIT/ML IV SOLN
500.0000 [IU] | Freq: Once | INTRAVENOUS | Status: AC | PRN
  Administered 2014-04-05: 500 [IU]
  Filled 2014-04-05: qty 5

## 2014-04-05 MED ORDER — DEXAMETHASONE SODIUM PHOSPHATE 10 MG/ML IJ SOLN
10.0000 mg | Freq: Once | INTRAMUSCULAR | Status: AC
  Administered 2014-04-05: 10 mg via INTRAVENOUS

## 2014-04-05 MED ORDER — SODIUM CHLORIDE 0.9 % IV SOLN
Freq: Once | INTRAVENOUS | Status: AC
  Administered 2014-04-05: 14:00:00 via INTRAVENOUS

## 2014-04-05 MED ORDER — ONDANSETRON 8 MG/NS 50 ML IVPB
INTRAVENOUS | Status: AC
  Filled 2014-04-05: qty 8

## 2014-04-05 MED ORDER — DEXTROSE 5 % IV SOLN
27.0000 mg/m2 | Freq: Once | INTRAVENOUS | Status: AC
  Administered 2014-04-05: 60 mg via INTRAVENOUS
  Filled 2014-04-05: qty 30

## 2014-04-05 MED ORDER — FUROSEMIDE 20 MG PO TABS: 20.0000 mg | ORAL_TABLET | Freq: Every day | ORAL | Status: DC

## 2014-04-05 MED ORDER — SODIUM CHLORIDE 0.9 % IV SOLN: Freq: Once | INTRAVENOUS | Status: DC

## 2014-04-05 MED ORDER — ONDANSETRON 8 MG/50ML IVPB (CHCC)
8.0000 mg | Freq: Once | INTRAVENOUS | Status: AC
  Administered 2014-04-05: 8 mg via INTRAVENOUS

## 2014-04-05 MED ORDER — SODIUM CHLORIDE 0.9 % IJ SOLN
10.0000 mL | INTRAMUSCULAR | Status: DC | PRN
  Administered 2014-04-05: 10 mL
  Filled 2014-04-05: qty 10

## 2014-04-05 MED ORDER — DEXAMETHASONE SODIUM PHOSPHATE 10 MG/ML IJ SOLN
INTRAMUSCULAR | Status: AC
  Filled 2014-04-05: qty 1

## 2014-04-05 NOTE — Telephone Encounter (Signed)
Asked infusion room RN, Lauren to inform pt/wife of refill sent to [facility].

## 2014-04-05 NOTE — Telephone Encounter (Signed)
Ok to refill 30 tabs no refill

## 2014-04-05 NOTE — Patient Instructions (Signed)
[facility] Discharge Instructions for Patients Receiving Chemotherapy  Today you received the following chemotherapy agents kyprolis  To help prevent nausea and vomiting after your treatment, we encourage you to take your nausea medication as directed   If you develop nausea and vomiting that is not controlled by your nausea medication, call the clinic.   BELOW ARE SYMPTOMS THAT SHOULD BE REPORTED IMMEDIATELY:  *FEVER GREATER THAN 100.5 F  *CHILLS WITH OR WITHOUT FEVER  NAUSEA AND VOMITING THAT IS NOT CONTROLLED WITH YOUR NAUSEA MEDICATION  *UNUSUAL SHORTNESS OF BREATH  *UNUSUAL BRUISING OR BLEEDING  TENDERNESS IN MOUTH AND THROAT WITH OR WITHOUT PRESENCE OF ULCERS  *URINARY PROBLEMS  *BOWEL PROBLEMS  UNUSUAL RASH Items with * indicate a potential emergency and should be followed up as soon as possible.  Feel free to call the clinic you have any questions or concerns. The clinic phone number is [phone].  

## 2014-04-05 NOTE — Telephone Encounter (Signed)
Wife requests refill on Lasix 20 mg daily previously prescribed by Dr. Beryle Beams.

## 2014-04-07 ENCOUNTER — Other Ambulatory Visit: Payer: Self-pay | Admitting: Hematology and Oncology

## 2014-04-07 DIAGNOSIS — C9002 Multiple myeloma in relapse: Secondary | ICD-10-CM

## 2014-04-10 ENCOUNTER — Ambulatory Visit (HOSPITAL_BASED_OUTPATIENT_CLINIC_OR_DEPARTMENT_OTHER): Payer: Medicare Other

## 2014-04-10 ENCOUNTER — Ambulatory Visit (HOSPITAL_BASED_OUTPATIENT_CLINIC_OR_DEPARTMENT_OTHER): Payer: Medicare Other | Admitting: Hematology and Oncology

## 2014-04-10 ENCOUNTER — Other Ambulatory Visit: Payer: Self-pay | Admitting: Hematology and Oncology

## 2014-04-10 ENCOUNTER — Telehealth: Payer: Self-pay | Admitting: Hematology and Oncology

## 2014-04-10 VITALS — BP 144/74 | HR 75 | Temp 98.4°F | Resp 18 | Ht 70.0 in | Wt 230.0 lb

## 2014-04-10 DIAGNOSIS — C9002 Multiple myeloma in relapse: Secondary | ICD-10-CM

## 2014-04-10 DIAGNOSIS — Z95828 Presence of other vascular implants and grafts: Secondary | ICD-10-CM

## 2014-04-10 DIAGNOSIS — G609 Hereditary and idiopathic neuropathy, unspecified: Secondary | ICD-10-CM

## 2014-04-10 DIAGNOSIS — M549 Dorsalgia, unspecified: Secondary | ICD-10-CM

## 2014-04-10 DIAGNOSIS — D696 Thrombocytopenia, unspecified: Secondary | ICD-10-CM

## 2014-04-10 DIAGNOSIS — K5900 Constipation, unspecified: Secondary | ICD-10-CM

## 2014-04-10 DIAGNOSIS — D649 Anemia, unspecified: Secondary | ICD-10-CM

## 2014-04-10 LAB — CBC WITH DIFFERENTIAL/PLATELET
BASO%: 0.1 % (ref 0.0–2.0)
Basophils Absolute: 0 10*3/uL (ref 0.0–0.1)
EOS ABS: 0 10*3/uL (ref 0.0–0.5)
EOS%: 0.8 % (ref 0.0–7.0)
HCT: 35.5 % — ABNORMAL LOW (ref 38.4–49.9)
HEMOGLOBIN: 12 g/dL — AB (ref 13.0–17.1)
LYMPH%: 11.5 % — ABNORMAL LOW (ref 14.0–49.0)
MCH: 34.3 pg — ABNORMAL HIGH (ref 27.2–33.4)
MCHC: 33.8 g/dL (ref 32.0–36.0)
MCV: 101.6 fL — ABNORMAL HIGH (ref 79.3–98.0)
MONO#: 0.4 10*3/uL (ref 0.1–0.9)
MONO%: 9 % (ref 0.0–14.0)
NEUT#: 3.4 10*3/uL (ref 1.5–6.5)
NEUT%: 78.6 % — ABNORMAL HIGH (ref 39.0–75.0)
Platelets: 74 10*3/uL — ABNORMAL LOW (ref 140–400)
RBC: 3.49 10*6/uL — AB (ref 4.20–5.82)
RDW: 14.3 % (ref 11.0–14.6)
WBC: 4.4 10*3/uL (ref 4.0–10.3)
lymph#: 0.5 10*3/uL — ABNORMAL LOW (ref 0.9–3.3)

## 2014-04-10 LAB — COMPREHENSIVE METABOLIC PANEL (CC13)
ALBUMIN: 3.5 g/dL (ref 3.5–5.0)
ALK PHOS: 101 U/L (ref 40–150)
ALT: <6 U/L (ref 0–55)
AST: 13 U/L (ref 5–34)
Anion Gap: 7 mEq/L (ref 3–11)
BUN: 18.6 mg/dL (ref 7.0–26.0)
CALCIUM: 9.1 mg/dL (ref 8.4–10.4)
CO2: 27 mEq/L (ref 22–29)
Chloride: 108 mEq/L (ref 98–109)
Creatinine: 1.2 mg/dL (ref 0.7–1.3)
Glucose: 201 mg/dl — ABNORMAL HIGH (ref 70–140)
POTASSIUM: 4 meq/L (ref 3.5–5.1)
SODIUM: 142 meq/L (ref 136–145)
TOTAL PROTEIN: 5.5 g/dL — AB (ref 6.4–8.3)
Total Bilirubin: 0.79 mg/dL (ref 0.20–1.20)

## 2014-04-10 LAB — LACTATE DEHYDROGENASE (CC13): LDH: 211 U/L (ref 125–245)

## 2014-04-10 LAB — TECHNOLOGIST REVIEW

## 2014-04-10 MED ORDER — HEPARIN SOD (PORK) LOCK FLUSH 100 UNIT/ML IV SOLN
500.0000 [IU] | Freq: Once | INTRAVENOUS | Status: AC
  Administered 2014-04-10: 500 [IU] via INTRAVENOUS
  Filled 2014-04-10: qty 5

## 2014-04-10 MED ORDER — SODIUM CHLORIDE 0.9 % IJ SOLN
10.0000 mL | INTRAMUSCULAR | Status: DC | PRN
  Administered 2014-04-10: 10 mL via INTRAVENOUS
  Filled 2014-04-10: qty 10

## 2014-04-10 NOTE — Patient Instructions (Signed)

## 2014-04-10 NOTE — Progress Notes (Signed)
[place] progress notes  Patient Care Team: Kandice Hams, MD as PCP - General (Internal Medicine) Annia Belt, MD as Consulting Physician (Oncology) Annia Belt, MD as Consulting Physician (Internal Medicine)  CHIEF COMPLAINTS/PURPOSE OF VISIT:  Lambda light chain multiple myeloma  HISTORY OF PRESENTING ILLNESS:  [name] [name] 73 y.o. male was transferred to my care after his prior physician has left.  I reviewed the patient's records extensive and collaborated the history with the patient. Summary of his history is as follows:  He was initially diagnosed in June 2010. He had a heavy myeloma burden at time of diagnosis with multiple lytic bone lesions, 87% plasma cells in the bone marrow, and 6.7 g of protein in the urine. 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 He went on to receive high-dose IV melphalan with autologous stem cell support at [facility] 03/12/2010. We began to see small amounts of monoclonal lambda free light chains again in his urine in March of 2012. He was started on Revlimid 10 mg daily beginning in April 2012. Dose had to be adjusted due to myelosuppression down to 5 mg. At time of a visit here in December 2013, he was complaining of recurrent pain in his shoulders, bilateral ribs, and low back. Lab showed rise in his serum and urine paraprotein levels. He was restaged with a bone marrow biopsy on 12/31/2012 which showed 10% plasma cells, rise in total urine protein from 260 mg to 1039 mg, and rise in serum free lambda light chains from 4.8 mg percent in July 2013,to 10 mg percent on October 29, to a 25.8 mg percent by December 16, to 58.5 mg percent by 01/07/2013. He was then started him on pomalidomide on 01/28/2013. Initial dose 3 mg 21 days on 7 days rest.  He tolerated the drug well. Unfortunately, at time of his visit here on August 2014 he  appeared to be breaking through the pomalidomide. Although his hematologic profile remained stable, there was a progressive rise in the lambda free light chains up to 116 mg percent as of 07/26/2013 compared with 58 on May 28. 24 hour urine total protein back up to 6645 mg. After a lengthy discussion with respect to treatment options, he was started on trial of Bendamustine plus prednisone. This was started on September 4. He had 6 cycles through  02/01/2014. He appeared to have a initial improvement with fall in his serum lambda free light chains from 116 mg percent down to 91 by November 2014, fall in 24 hour urine protein from 6.6 g down to 5.4, fall in his creatinine from 1.4 down to 1.1. Unfortunately, recent reevaluation done on February 01, 2014 shows rise in the serum light chains up to 261 mg percent, urine 24-hour protein up to 14.4 g, and creatinine up to 1.3.  On 02/28/2014, he was started on a trial of kyprolis   The patient continued to have severe back pain and worsening peripheral neuropathy. He has poorly controlled blood sugar. He also complained of severe bilateral lower extremity edema. He is also constipated, requiring regular laxatives. He complained of feeling weak overall.  MEDICAL HISTORY:  Past Medical History  Diagnosis Date  . Diabetes mellitus   . GERD (gastroesophageal reflux disease)   . Anemia   . Hypertension   . Prostatic hyperplasia   . Arthritis   . Multiple myeloma in relapse 12/24/2011  .  Benign essential HTN 12/24/2011  . CRI (chronic renal insufficiency) 12/26/2011  . 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 DVT of lower extremity (deep venous thrombosis) 05/14/2011  . Hyperlipidemia, mixed 12/26/2011  . Herpes zoster infection 03/11/2010  . DM II (diabetes mellitus, type II), controlled 12/26/2011  . Pharyngitis, acute 03/21/2013  . 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 Diabetic neuropathy, type II diabetes  mellitus 05/31/2013  . Urethritis 10/04/2013    SURGICAL HISTORY: Past Surgical History  Procedure Laterality Date  . Foot surgery Left     SOCIAL HISTORY: History   Social History  . Marital Status: Married    Spouse Name: Rosaria Ferries    Number of Children: 2  . Years of Education: 12   Occupational History  .     Social History Main Topics  . Smoking status: Former Smoker    Quit date: 03/08/2008  . Smokeless tobacco: Never Used  . Alcohol Use: No  . Drug Use: No  . Sexual Activity: No   Other Topics Concern  . Not on file   Social History Narrative   Patient is married Rosaria Ferries), has 2 children   Patient is right handed   Education level is high school   Caffeine consumption is 1 cup daily    FAMILY HISTORY: Family History  Problem Relation Age of Onset  . CAD Neg Hx     ALLERGIES:  has No Known Allergies.  MEDICATIONS:  Current Outpatient Prescriptions  Medication Sig Dispense Refill  . acetaminophen (TYLENOL) 500 MG tablet Take 500 mg by mouth every 6 (six) hours as needed. For pain.      Marland Kitchen aspirin EC 81 MG tablet Take 81 mg by mouth daily after breakfast.       . atorvastatin (LIPITOR) 10 MG tablet Take 10 mg by mouth at bedtime.        . BD ULTRA-FINE PEN NEEDLES 29G X 12.7MM MISC Inject 30 Units into the skin daily.       . carbidopa-levodopa (SINEMET IR) 25-100 MG per tablet TAKE 1 TABLET 3 TIMES A DAY  90 tablet  6  . furosemide (LASIX) 20 MG tablet Take 1 tablet (20 mg total) by mouth daily.  30 tablet  0  . Insulin Glargine (LANTUS SOLOSTAR [place]) Inject 20-30 Units into the skin every evening.       . lidocaine-prilocaine (EMLA) cream Apply 1 application topically as needed. Apply to port at least 1 hour before procedure & cover.  30 g  1  . ondansetron (ZOFRAN ODT) 8 MG disintegrating tablet Take 1 tablet (8 mg total) by mouth every 8 (eight) hours as needed for nausea.  30 tablet  prn  . oxyCODONE (OXY IR/ROXICODONE) 5 MG immediate release tablet Take  5-10 mg by mouth every 4 (four) hours as needed. For pain.      Marland Kitchen oxyCODONE (OXYCONTIN) 20 MG 12 hr tablet Take 20 mg by mouth every 12 (twelve) hours as needed. For pain.      . polyethylene glycol (MIRALAX / GLYCOLAX) packet Take 17 g by mouth daily after breakfast.       . pregabalin (LYRICA) 100 MG capsule Take 1 capsule (100 mg total) by mouth 2 (two) times daily.  60 capsule  1  . rasagiline (AZILECT) 1 MG TABS tablet Take 1 tablet (1 mg total) by mouth daily.  30 tablet  6  . valACYclovir (VALTREX) 500 MG tablet Take 500 mg  by mouth daily. Takes once daily.       No current facility-administered medications for this visit.    REVIEW OF SYSTEMS:   Constitutional: Denies fevers, chills or abnormal night sweats Eyes: Denies blurriness of vision, double vision or watery eyes Ears, nose, mouth, throat, and face: Denies mucositis or sore throat Respiratory: Denies cough, dyspnea or wheezes Cardiovascular: Denies palpitation, chest discomfort  Skin: Denies abnormal skin rashes Lymphatics: Denies new lymphadenopathy or easy bruising Behavioral/Psych: Mood is stable, no new changes  All other systems were reviewed with the patient and are negative.  PHYSICAL EXAMINATION: ECOG PERFORMANCE STATUS: 2 - Symptomatic, <50% confined to bed  Filed Vitals:   04/10/14 1310  BP: 144/74  Pulse: 75  Temp: 98.4 F (36.9 C)  Resp: 18   Filed Weights   04/10/14 1310  Weight: 230 lb (104.327 kg)    GENERAL:alert, no distress and comfortable SKIN: skin color, texture, turgor are normal, no rashes or significant lesions EYES: normal, conjunctiva are pink and non-injected, sclera clear OROPHARYNX:no exudate, normal lips, buccal mucosa, and tongue  NECK: supple, thyroid normal size, non-tender, without nodularity LYMPH:  no palpable lymphadenopathy in the cervical, axillary or inguinal LUNGS: clear to auscultation and percussion with normal breathing effort HEART: regular rate & rhythm and no  murmurs with bilateral moderate lower extremity edema ABDOMEN:abdomen soft, non-tender and normal bowel sounds Musculoskeletal:no cyanosis of digits and no clubbing  PSYCH: alert & oriented x 3 with fluent speech NEURO: no focal motor/sensory deficits  LABORATORY DATA:  I have reviewed the data as listed Lab Results  Component Value Date   WBC 4.4 04/10/2014   HGB 12.0* 04/10/2014   HCT 35.5* 04/10/2014   MCV 101.6* 04/10/2014   PLT 74* 04/10/2014    Recent Labs  05/18/13 1113  12/15/13 2159 12/16/13 1350  03/07/14 1041 04/04/14 1445 04/10/14 1220  NA 139  < > 141 138  < > 140 141 142  K 3.9  < > 3.2* 3.6  < > 3.7 4.0 4.0  CL 104  --  100 104  --   --   --   --   CO2 27  < > 30 24  < > 25 28 27   GLUCOSE 325*  < > 218* 192*  < > 207* 204* 201*  BUN 28.4*  < > 11 10  < > 12.1 14.1 18.6  CREATININE 1.5*  < > 1.53* 1.28  < > 1.3 1.2 1.2  CALCIUM 8.4  < > 8.9 7.7*  < > 8.5 8.9 9.1  GFRNONAA  --   --  43* 54*  --   --   --   --   GFRAA  --   --  50* 62*  --   --   --   --   PROT 6.0*  < > 6.4 5.1*  < > 5.5* 5.7* 5.5*  ALBUMIN 3.5  < > 3.8 2.9*  < > 3.3* 3.6 3.5  AST 9  < > 26 26  < > 15 14 13   ALT <6 Repeated and Verified  < > 19 <5  < > <6 <6 <6  ALKPHOS 138  < > 153* 109  < > 112 110 101  BILITOT 0.92  < > 0.8 0.5  < > 0.73 0.91 0.79  < > = values in this interval not displayed.  ASSESSMENT & PLAN:  #1 relapsed multiple myeloma Repeat light chains from today is pending. I will recommend we  continue on current treatment without dosage adjustment. I will see him back in approximately 2 weeks. If he is not responding to the current treatment, alternative would be referring him back to [facility] versus enrollment in clinical trial #2 anemia #3 thrombocytopenia These are treatment related side effects. He is not symptomatic. I will recommend we observed and transfuse as needed #4 chronic constipation I recommend him to continue laxative #5 poorly controlled diabetes I  recommend adjustment in insulin dosage as outlined by his primary care physician #6 chronic back pain Continue on calcium and vitamin D supplementation #7 antimicrobial prophylaxis He will continue on Valtrex #8 severe peripheral neuropathy This is related to prior treatment side effects and diabetic neuropathy He will continue taking the Lyrica  Orders Placed This Encounter  Procedures  . CBC with Differential    Standing Status: Future     Number of Occurrences:      Standing Expiration Date: 04/10/2015  . Comprehensive metabolic panel    Standing Status: Future     Number of Occurrences:      Standing Expiration Date: 04/10/2015  . Lactate dehydrogenase    Standing Status: Future     Number of Occurrences:      Standing Expiration Date: 04/10/2015  . Kappa/lambda light chains    Standing Status: Future     Number of Occurrences:      Standing Expiration Date: 04/10/2015  . SPEP & IFE with QIG    Standing Status: Future     Number of Occurrences:      Standing Expiration Date: 04/10/2015    All questions were answered. The patient knows to call the clinic with any problems, questions or concerns. I spent 40 minutes counseling the patient face to face. The total time spent in the appointment was 55 minutes and more than 50% was on counseling.     Heath Lark, MD 04/10/2014 5:11 PM

## 2014-04-10 NOTE — Telephone Encounter (Signed)
gv and pritned appt sched and avs for pt for April adn May....Marland Kitchensed added tx.

## 2014-04-11 ENCOUNTER — Ambulatory Visit (HOSPITAL_BASED_OUTPATIENT_CLINIC_OR_DEPARTMENT_OTHER): Payer: Medicare Other

## 2014-04-11 VITALS — BP 141/81 | HR 73 | Temp 97.2°F | Resp 17

## 2014-04-11 DIAGNOSIS — IMO0002 Reserved for concepts with insufficient information to code with codable children: Secondary | ICD-10-CM

## 2014-04-11 DIAGNOSIS — B029 Zoster without complications: Secondary | ICD-10-CM

## 2014-04-11 DIAGNOSIS — C9002 Multiple myeloma in relapse: Secondary | ICD-10-CM

## 2014-04-11 DIAGNOSIS — N189 Chronic kidney disease, unspecified: Secondary | ICD-10-CM

## 2014-04-11 DIAGNOSIS — Z5112 Encounter for antineoplastic immunotherapy: Secondary | ICD-10-CM

## 2014-04-11 MED ORDER — DEXAMETHASONE SODIUM PHOSPHATE 10 MG/ML IJ SOLN
10.0000 mg | Freq: Once | INTRAMUSCULAR | Status: AC
  Administered 2014-04-11: 10 mg via INTRAVENOUS

## 2014-04-11 MED ORDER — SODIUM CHLORIDE 0.9 % IV SOLN
Freq: Once | INTRAVENOUS | Status: AC
  Administered 2014-04-11: 14:00:00 via INTRAVENOUS

## 2014-04-11 MED ORDER — ONDANSETRON 8 MG/NS 50 ML IVPB
INTRAVENOUS | Status: AC
  Filled 2014-04-11: qty 8

## 2014-04-11 MED ORDER — DEXTROSE 5 % IV SOLN
27.0000 mg/m2 | Freq: Once | INTRAVENOUS | Status: AC
  Administered 2014-04-11: 60 mg via INTRAVENOUS
  Filled 2014-04-11: qty 30

## 2014-04-11 MED ORDER — SODIUM CHLORIDE 0.9 % IJ SOLN
10.0000 mL | INTRAMUSCULAR | Status: DC | PRN
Start: 2014-04-11 — End: 2014-04-11
  Administered 2014-04-11: 10 mL
  Filled 2014-04-11: qty 10

## 2014-04-11 MED ORDER — HEPARIN SOD (PORK) LOCK FLUSH 100 UNIT/ML IV SOLN
500.0000 [IU] | Freq: Once | INTRAVENOUS | Status: AC | PRN
  Administered 2014-04-11: 500 [IU]
  Filled 2014-04-11: qty 5

## 2014-04-11 MED ORDER — DEXAMETHASONE SODIUM PHOSPHATE 10 MG/ML IJ SOLN
INTRAMUSCULAR | Status: AC
  Filled 2014-04-11: qty 1

## 2014-04-11 MED ORDER — ONDANSETRON 8 MG/50ML IVPB (CHCC)
8.0000 mg | Freq: Once | INTRAVENOUS | Status: AC
  Administered 2014-04-11: 8 mg via INTRAVENOUS

## 2014-04-11 NOTE — Patient Instructions (Signed)
[facility] Discharge Instructions for Patients Receiving Chemotherapy  Today you received the following chemotherapy agents: Kyprolis  To help prevent nausea and vomiting after your treatment, we encourage you to take your nausea medication: Zofran 8 mg every 8 hrs as needed.   If you develop nausea and vomiting that is not controlled by your nausea medication, call the clinic.   BELOW ARE SYMPTOMS THAT SHOULD BE REPORTED IMMEDIATELY:  *FEVER GREATER THAN 100.5 F  *CHILLS WITH OR WITHOUT FEVER  NAUSEA AND VOMITING THAT IS NOT CONTROLLED WITH YOUR NAUSEA MEDICATION  *UNUSUAL SHORTNESS OF BREATH  *UNUSUAL BRUISING OR BLEEDING  TENDERNESS IN MOUTH AND THROAT WITH OR WITHOUT PRESENCE OF ULCERS  *URINARY PROBLEMS  *BOWEL PROBLEMS  UNUSUAL RASH Items with * indicate a potential emergency and should be followed up as soon as possible.  Feel free to call the clinic you have any questions or concerns. The clinic phone number is [phone].    

## 2014-04-11 NOTE — Progress Notes (Signed)
Per Dr. Alvy Bimler, okay to treat with platelets 74 on 04/10/14. Treatment tolerated well.

## 2014-04-12 ENCOUNTER — Ambulatory Visit (HOSPITAL_BASED_OUTPATIENT_CLINIC_OR_DEPARTMENT_OTHER): Payer: Medicare Other

## 2014-04-12 VITALS — BP 151/83 | HR 83 | Temp 97.8°F | Resp 18

## 2014-04-12 DIAGNOSIS — B029 Zoster without complications: Secondary | ICD-10-CM

## 2014-04-12 DIAGNOSIS — C9002 Multiple myeloma in relapse: Secondary | ICD-10-CM

## 2014-04-12 DIAGNOSIS — IMO0002 Reserved for concepts with insufficient information to code with codable children: Secondary | ICD-10-CM

## 2014-04-12 DIAGNOSIS — N189 Chronic kidney disease, unspecified: Secondary | ICD-10-CM

## 2014-04-12 DIAGNOSIS — Z5112 Encounter for antineoplastic immunotherapy: Secondary | ICD-10-CM

## 2014-04-12 LAB — SPEP & IFE WITH QIG
ALPHA-2-GLOBULIN: 10.6 % (ref 7.1–11.8)
Albumin ELP: 68.7 % — ABNORMAL HIGH (ref 55.8–66.1)
Alpha-1-Globulin: 5 % — ABNORMAL HIGH (ref 2.9–4.9)
Beta 2: 2.6 % — ABNORMAL LOW (ref 3.2–6.5)
Beta Globulin: 6.8 % (ref 4.7–7.2)
GAMMA GLOBULIN: 6.3 % — AB (ref 11.1–18.8)
IGA: 8 mg/dL — AB (ref 68–379)
IGG (IMMUNOGLOBIN G), SERUM: 372 mg/dL — AB (ref 650–1600)
M-Spike, %: 0.1 g/dL
Total Protein, Serum Electrophoresis: 5.4 g/dL — ABNORMAL LOW (ref 6.0–8.3)

## 2014-04-12 LAB — KAPPA/LAMBDA LIGHT CHAINS
KAPPA LAMBDA RATIO: 0 — AB (ref 0.26–1.65)
Kappa free light chain: 0.13 mg/dL — ABNORMAL LOW (ref 0.33–1.94)
Lambda Free Lght Chn: 62.7 mg/dL — ABNORMAL HIGH (ref 0.57–2.63)

## 2014-04-12 LAB — BETA 2 MICROGLOBULIN, SERUM: BETA 2 MICROGLOBULIN: 3.13 mg/L — AB (ref <=2.51)

## 2014-04-12 MED ORDER — HEPARIN SOD (PORK) LOCK FLUSH 100 UNIT/ML IV SOLN
500.0000 [IU] | Freq: Once | INTRAVENOUS | Status: AC | PRN
  Administered 2014-04-12: 500 [IU]
  Filled 2014-04-12: qty 5

## 2014-04-12 MED ORDER — SODIUM CHLORIDE 0.9 % IV SOLN: Freq: Once | INTRAVENOUS | Status: DC

## 2014-04-12 MED ORDER — SODIUM CHLORIDE 0.9 % IJ SOLN
10.0000 mL | INTRAMUSCULAR | Status: DC | PRN
  Administered 2014-04-12: 10 mL
  Filled 2014-04-12: qty 10

## 2014-04-12 MED ORDER — DEXAMETHASONE SODIUM PHOSPHATE 10 MG/ML IJ SOLN
INTRAMUSCULAR | Status: AC
  Filled 2014-04-12: qty 1

## 2014-04-12 MED ORDER — ONDANSETRON 8 MG/NS 50 ML IVPB
INTRAVENOUS | Status: AC
  Filled 2014-04-12: qty 8

## 2014-04-12 MED ORDER — CARFILZOMIB CHEMO INJECTION 60 MG
27.0000 mg/m2 | Freq: Once | INTRAVENOUS | Status: AC
  Administered 2014-04-12: 60 mg via INTRAVENOUS
  Filled 2014-04-12: qty 30

## 2014-04-12 MED ORDER — ONDANSETRON 8 MG/50ML IVPB (CHCC)
8.0000 mg | Freq: Once | INTRAVENOUS | Status: AC
  Administered 2014-04-12: 8 mg via INTRAVENOUS

## 2014-04-12 MED ORDER — SODIUM CHLORIDE 0.9 % IV SOLN
Freq: Once | INTRAVENOUS | Status: AC
  Administered 2014-04-12: 13:00:00 via INTRAVENOUS

## 2014-04-12 MED ORDER — DEXAMETHASONE SODIUM PHOSPHATE 10 MG/ML IJ SOLN
10.0000 mg | Freq: Once | INTRAMUSCULAR | Status: AC
  Administered 2014-04-12: 10 mg via INTRAVENOUS

## 2014-04-12 NOTE — Patient Instructions (Signed)
[facility] Discharge Instructions for Patients Receiving Chemotherapy  Today you received the following chemotherapy agents Kyprolis.  To help prevent nausea and vomiting after your treatment, we encourage you to take your nausea medication.   If you develop nausea and vomiting that is not controlled by your nausea medication, call the clinic.   BELOW ARE SYMPTOMS THAT SHOULD BE REPORTED IMMEDIATELY:  *FEVER GREATER THAN 100.5 F  *CHILLS WITH OR WITHOUT FEVER  NAUSEA AND VOMITING THAT IS NOT CONTROLLED WITH YOUR NAUSEA MEDICATION  *UNUSUAL SHORTNESS OF BREATH  *UNUSUAL BRUISING OR BLEEDING  TENDERNESS IN MOUTH AND THROAT WITH OR WITHOUT PRESENCE OF ULCERS  *URINARY PROBLEMS  *BOWEL PROBLEMS  UNUSUAL RASH Items with * indicate a potential emergency and should be followed up as soon as possible.  Feel free to call the clinic you have any questions or concerns. The clinic phone number is [phone].    

## 2014-04-18 ENCOUNTER — Other Ambulatory Visit: Payer: Medicare Other

## 2014-04-18 ENCOUNTER — Telehealth: Payer: Self-pay | Admitting: *Deleted

## 2014-04-18 NOTE — Telephone Encounter (Signed)
Spouse wanting to know if there's a generic for rasagiline (AZILECT) 1 MG TABS tablet.  Medication is costing 230.00 for 30 pills and cant afford.  Please call patient. thanks

## 2014-04-18 NOTE — Telephone Encounter (Signed)
Unfortunately, this medication is only available in name brand.  I called the patient back at home, got no answer, no option to leave message.  Called cell, got no answer.  Left message recommending they contact the [employer] at [phone] to see if they qualify for assistance.

## 2014-04-20 ENCOUNTER — Telehealth: Payer: Self-pay | Admitting: *Deleted

## 2014-04-20 NOTE — Telephone Encounter (Signed)
No it was an add on for his Sinemet.

## 2014-04-20 NOTE — Telephone Encounter (Signed)
Wife calling to inform Hoyle Sauer that she's discontinuing the rasagiline (AZILECT) 1 MG TABS tablet medication for the patient due to unable to afford.  She stated she called [employer] Asst Program, but didn't understand/or couldn't hear what they were saying.  FYI

## 2014-04-20 NOTE — Telephone Encounter (Signed)
Pt's wife calling stating that she is discontinuing Azilect because of the cost. Please advise

## 2014-04-20 NOTE — Telephone Encounter (Signed)
Hoyle Sauer, Ms. Rogacki called the office to let you know they are stopping Azilect due to cost.  I previously provided them with info for the assistance program, but it appears they do not wish to proceed with this because she could not understand and/or hear what they were asking.  Unfortunately, we are unable to initiate this for them, as personal info (i.e. Income) is required.  Would you like the patient to try a different medication?  Please advise.  Thank you.

## 2014-04-25 ENCOUNTER — Ambulatory Visit: Payer: Medicare Other

## 2014-04-25 ENCOUNTER — Ambulatory Visit (HOSPITAL_BASED_OUTPATIENT_CLINIC_OR_DEPARTMENT_OTHER): Payer: Medicare Other

## 2014-04-25 ENCOUNTER — Telehealth: Payer: Self-pay | Admitting: Hematology and Oncology

## 2014-04-25 ENCOUNTER — Encounter: Payer: Self-pay | Admitting: Hematology and Oncology

## 2014-04-25 ENCOUNTER — Other Ambulatory Visit (HOSPITAL_BASED_OUTPATIENT_CLINIC_OR_DEPARTMENT_OTHER): Payer: Medicare Other

## 2014-04-25 ENCOUNTER — Telehealth: Payer: Self-pay | Admitting: *Deleted

## 2014-04-25 ENCOUNTER — Ambulatory Visit (HOSPITAL_BASED_OUTPATIENT_CLINIC_OR_DEPARTMENT_OTHER): Payer: Medicare Other | Admitting: Hematology and Oncology

## 2014-04-25 VITALS — BP 140/65 | HR 81 | Temp 97.9°F | Resp 18 | Ht 70.0 in | Wt 232.1 lb

## 2014-04-25 DIAGNOSIS — C9002 Multiple myeloma in relapse: Secondary | ICD-10-CM

## 2014-04-25 DIAGNOSIS — IMO0002 Reserved for concepts with insufficient information to code with codable children: Secondary | ICD-10-CM

## 2014-04-25 DIAGNOSIS — Z5112 Encounter for antineoplastic immunotherapy: Secondary | ICD-10-CM

## 2014-04-25 DIAGNOSIS — M549 Dorsalgia, unspecified: Secondary | ICD-10-CM

## 2014-04-25 DIAGNOSIS — D649 Anemia, unspecified: Secondary | ICD-10-CM

## 2014-04-25 DIAGNOSIS — B029 Zoster without complications: Secondary | ICD-10-CM

## 2014-04-25 DIAGNOSIS — N189 Chronic kidney disease, unspecified: Secondary | ICD-10-CM

## 2014-04-25 DIAGNOSIS — G609 Hereditary and idiopathic neuropathy, unspecified: Secondary | ICD-10-CM

## 2014-04-25 DIAGNOSIS — D696 Thrombocytopenia, unspecified: Secondary | ICD-10-CM

## 2014-04-25 DIAGNOSIS — Z95828 Presence of other vascular implants and grafts: Secondary | ICD-10-CM

## 2014-04-25 LAB — CBC WITH DIFFERENTIAL/PLATELET
BASO%: 0.4 % (ref 0.0–2.0)
BASOS ABS: 0 10*3/uL (ref 0.0–0.1)
EOS%: 1.7 % (ref 0.0–7.0)
Eosinophils Absolute: 0.1 10*3/uL (ref 0.0–0.5)
HEMATOCRIT: 35.1 % — AB (ref 38.4–49.9)
HEMOGLOBIN: 11.8 g/dL — AB (ref 13.0–17.1)
LYMPH%: 11.9 % — ABNORMAL LOW (ref 14.0–49.0)
MCH: 34.5 pg — AB (ref 27.2–33.4)
MCHC: 33.6 g/dL (ref 32.0–36.0)
MCV: 102.6 fL — ABNORMAL HIGH (ref 79.3–98.0)
MONO#: 0.4 10*3/uL (ref 0.1–0.9)
MONO%: 13 % (ref 0.0–14.0)
NEUT#: 2.3 10*3/uL (ref 1.5–6.5)
NEUT%: 73 % (ref 39.0–75.0)
Platelets: 101 10*3/uL — ABNORMAL LOW (ref 140–400)
RBC: 3.42 10*6/uL — ABNORMAL LOW (ref 4.20–5.82)
RDW: 14.4 % (ref 11.0–14.6)
WBC: 3.2 10*3/uL — ABNORMAL LOW (ref 4.0–10.3)
lymph#: 0.4 10*3/uL — ABNORMAL LOW (ref 0.9–3.3)

## 2014-04-25 LAB — COMPREHENSIVE METABOLIC PANEL (CC13)
ALK PHOS: 112 U/L (ref 40–150)
ALT: 16 U/L (ref 0–55)
AST: 13 U/L (ref 5–34)
Albumin: 3.5 g/dL (ref 3.5–5.0)
Anion Gap: 8 mEq/L (ref 3–11)
BUN: 15 mg/dL (ref 7.0–26.0)
CO2: 26 mEq/L (ref 22–29)
CREATININE: 1.5 mg/dL — AB (ref 0.7–1.3)
Calcium: 8.8 mg/dL (ref 8.4–10.4)
Chloride: 108 mEq/L (ref 98–109)
Glucose: 292 mg/dl — ABNORMAL HIGH (ref 70–140)
Potassium: 3.6 mEq/L (ref 3.5–5.1)
Sodium: 142 mEq/L (ref 136–145)
Total Bilirubin: 0.88 mg/dL (ref 0.20–1.20)
Total Protein: 5.6 g/dL — ABNORMAL LOW (ref 6.4–8.3)

## 2014-04-25 LAB — LACTATE DEHYDROGENASE (CC13): LDH: 236 U/L (ref 125–245)

## 2014-04-25 MED ORDER — ONDANSETRON 8 MG/NS 50 ML IVPB
INTRAVENOUS | Status: AC
  Filled 2014-04-25: qty 8

## 2014-04-25 MED ORDER — DEXAMETHASONE SODIUM PHOSPHATE 10 MG/ML IJ SOLN
10.0000 mg | Freq: Once | INTRAMUSCULAR | Status: AC
  Administered 2014-04-25: 10 mg via INTRAVENOUS

## 2014-04-25 MED ORDER — HEPARIN SOD (PORK) LOCK FLUSH 100 UNIT/ML IV SOLN
500.0000 [IU] | Freq: Once | INTRAVENOUS | Status: AC | PRN
  Administered 2014-04-25: 500 [IU]
  Filled 2014-04-25: qty 5

## 2014-04-25 MED ORDER — SODIUM CHLORIDE 0.9 % IV SOLN
Freq: Once | INTRAVENOUS | Status: AC
  Administered 2014-04-25: 13:00:00 via INTRAVENOUS

## 2014-04-25 MED ORDER — ONDANSETRON 8 MG/50ML IVPB (CHCC)
8.0000 mg | Freq: Once | INTRAVENOUS | Status: AC
  Administered 2014-04-25: 8 mg via INTRAVENOUS

## 2014-04-25 MED ORDER — MORPHINE SULFATE ER 30 MG PO TBCR: 30.0000 mg | EXTENDED_RELEASE_TABLET | Freq: Two times a day (BID) | ORAL | Status: DC

## 2014-04-25 MED ORDER — SODIUM CHLORIDE 0.9 % IJ SOLN
10.0000 mL | INTRAMUSCULAR | Status: DC | PRN
  Administered 2014-04-25: 10 mL via INTRAVENOUS
  Filled 2014-04-25: qty 10

## 2014-04-25 MED ORDER — MORPHINE SULFATE 15 MG PO TABS
15.0000 mg | ORAL_TABLET | ORAL | Status: DC | PRN
Start: 2014-04-25 — End: 2014-05-23

## 2014-04-25 MED ORDER — DEXAMETHASONE SODIUM PHOSPHATE 10 MG/ML IJ SOLN
INTRAMUSCULAR | Status: AC
  Filled 2014-04-25: qty 1

## 2014-04-25 MED ORDER — DEXTROSE 5 % IV SOLN
27.0000 mg/m2 | Freq: Once | INTRAVENOUS | Status: AC
  Administered 2014-04-25: 60 mg via INTRAVENOUS
  Filled 2014-04-25: qty 30

## 2014-04-25 MED ORDER — SODIUM CHLORIDE 0.9 % IJ SOLN
10.0000 mL | INTRAMUSCULAR | Status: DC | PRN
  Administered 2014-04-25: 10 mL
  Filled 2014-04-25: qty 10

## 2014-04-25 NOTE — Progress Notes (Signed)
Pt and wife instructed to stop lasix per Dr Alvy Bimler. Verbalized understanding

## 2014-04-25 NOTE — Telephone Encounter (Signed)
gave pt appt for lab,md and chemo for May and june 2015

## 2014-04-25 NOTE — Progress Notes (Signed)
[place] OFFICE PROGRESS NOTE  Patient Care Team: Kandice Hams, MD as PCP - General (Internal Medicine) Annia Belt, MD as Consulting Physician (Oncology) Annia Belt, MD as Consulting Physician (Internal Medicine)  DIAGNOSIS: Multiple myeloma, ongoing treatment with Carfilzomib  SUMMARY OF ONCOLOGIC HISTORY:  He was initially diagnosed in June 2010. He had a heavy myeloma burden at time of diagnosis with multiple lytic bone lesions, 87% plasma cells in the bone marrow, and 6.7 g of protein in the urine. 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 He went on to receive high-dose IV melphalan with autologous stem cell support at [facility] 03/12/2010. We began to see small amounts of monoclonal lambda free light chains again in his urine in March of 2012. He was started on Revlimid 10 mg daily beginning in April 2012. Dose had to be adjusted due to myelosuppression down to 5 mg. At time of a visit here in December 2013, he was complaining of recurrent pain in his shoulders, bilateral ribs, and low back. Lab showed rise in his serum and urine paraprotein levels. He was restaged with a bone marrow biopsy on 12/31/2012 which showed 10% plasma cells, rise in total urine protein from 260 mg to 1039 mg, and rise in serum free lambda light chains from 4.8 mg percent in July 2013,to 10 mg percent on October 29, to a 25.8 mg percent by December 16, to 58.5 mg percent by 01/07/2013. He was then started him on pomalidomide on 01/28/2013. Initial dose 3 mg 21 days on 7 days rest.  He tolerated the drug well. Unfortunately, at time of his visit here on August 2014 he appeared to be breaking through the pomalidomide. Although his hematologic profile remained stable, there was a progressive rise in the lambda free light chains up to 116 mg percent as of 07/26/2013 compared with 58 on May 28. 24 hour urine  total protein back up to 6645 mg. After a lengthy discussion with respect to treatment options, he was started on trial of Bendamustine plus prednisone. This was started on September 4. He had 6 cycles through  02/01/2014. He appeared to have a initial improvement with fall in his serum lambda free light chains from 116 mg percent down to 91 by November 2014, fall in 24 hour urine protein from 6.6 g down to 5.4, fall in his creatinine from 1.4 down to 1.1. Unfortunately, recent reevaluation done on February 01, 2014 shows rise in the serum light chains up to 261 mg percent, urine 24-hour protein up to 14.4 g, and creatinine up to 1.3.  On 02/28/2014, he was started on a trial of kyprolis. Repeat serum lambda light chain on 04/10/2014 showed that the patient is responding to treatment.  INTERVAL HISTORY: [name] [name] 73 y.o. male returns for further followup. He feels weak and complaining of severe back pain. He also complained of mild constipation recently. Denies any nausea or vomiting. Denies any worsening peripheral neuropathy.  I have reviewed the past medical history, past surgical history, social history and family history with the patient and they are unchanged from previous note.  ALLERGIES:  has No Known Allergies.  MEDICATIONS:  Current Outpatient Prescriptions  Medication Sig Dispense Refill  . acetaminophen (TYLENOL) 500 MG tablet Take 500 mg by mouth every 6 (six) hours as needed. For pain.      Marland Kitchen aspirin EC 81 MG tablet Take  81 mg by mouth daily after breakfast.       . atorvastatin (LIPITOR) 10 MG tablet Take 10 mg by mouth at bedtime.        . BD ULTRA-FINE PEN NEEDLES 29G X 12.7MM MISC Inject 30 Units into the skin daily.       . carbidopa-levodopa (SINEMET IR) 25-100 MG per tablet TAKE 1 TABLET 3 TIMES A DAY  90 tablet  6  . furosemide (LASIX) 20 MG tablet Take 1 tablet (20 mg total) by mouth daily.  30 tablet  0  . Insulin Glargine (LANTUS SOLOSTAR [place]) Inject 20-30 Units  into the skin every evening.       . lidocaine-prilocaine (EMLA) cream Apply 1 application topically as needed. Apply to port at least 1 hour before procedure & cover.  30 g  1  . ondansetron (ZOFRAN ODT) 8 MG disintegrating tablet Take 1 tablet (8 mg total) by mouth every 8 (eight) hours as needed for nausea.  30 tablet  prn  . oxyCODONE (OXY IR/ROXICODONE) 5 MG immediate release tablet Take 5-10 mg by mouth every 4 (four) hours as needed. For pain.      Marland Kitchen oxyCODONE (OXYCONTIN) 20 MG 12 hr tablet Take 20 mg by mouth every 12 (twelve) hours as needed. For pain.      . polyethylene glycol (MIRALAX / GLYCOLAX) packet Take 17 g by mouth daily after breakfast.       . pregabalin (LYRICA) 100 MG capsule Take 1 capsule (100 mg total) by mouth 2 (two) times daily.  60 capsule  1  . rasagiline (AZILECT) 1 MG TABS tablet Take 1 tablet (1 mg total) by mouth daily.  30 tablet  6  . valACYclovir (VALTREX) 500 MG tablet Take 500 mg by mouth daily. Takes once daily.      Marland Kitchen morphine (MS CONTIN) 30 MG 12 hr tablet Take 1 tablet (30 mg total) by mouth every 12 (twelve) hours.  60 tablet  0  . morphine (MSIR) 15 MG tablet Take 1 tablet (15 mg total) by mouth every 4 (four) hours as needed for severe pain.  90 tablet  0   No current facility-administered medications for this visit.    REVIEW OF SYSTEMS:   Constitutional: Denies fevers, chills or abnormal weight loss Eyes: Denies blurriness of vision Ears, nose, mouth, throat, and face: Denies mucositis or sore throat Respiratory: Denies cough, dyspnea or wheezes Cardiovascular: Denies palpitation, chest discomfort. He has chronic mild bilateral lower extremity swelling Skin: Denies abnormal skin rashes Lymphatics: Denies new lymphadenopathy or easy bruising Behavioral/Psych: Mood is stable, no new changes  All other systems were reviewed with the patient and are negative.  PHYSICAL EXAMINATION: ECOG PERFORMANCE STATUS: 2 - Symptomatic, <50% confined to  bed  Filed Vitals:   04/25/14 1126  BP: 140/65  Pulse: 81  Temp: 97.9 F (36.6 C)  Resp: 18   Filed Weights   04/25/14 1126  Weight: 232 lb 1.6 oz (105.28 kg)    GENERAL:alert, no distress and comfortable. He looks elderly with no acute distress SKIN: skin color, texture, turgor are normal, no rashes or significant lesions EYES: normal, Conjunctiva are pink and non-injected, sclera clear OROPHARYNX:no exudate, no erythema and lips, buccal mucosa, and tongue normal  NECK: supple, thyroid normal size, non-tender, without nodularity LYMPH:  no palpable lymphadenopathy in the cervical, axillary or inguinal LUNGS: clear to auscultation and percussion with normal breathing effort HEART: regular rate & rhythm and no murmurs with mild  trace bilateral lower extremity edema ABDOMEN:abdomen soft, non-tender and normal bowel sounds Musculoskeletal:no cyanosis of digits and no clubbing  NEURO: alert & oriented x 3 with fluent speech, no focal motor/sensory deficits  LABORATORY DATA:  I have reviewed the data as listed    Component Value Date/Time   NA 142 04/25/2014 1104   NA 138 12/16/2013 1350   NA 145 06/13/2009 1503   K 3.6 04/25/2014 1104   K 3.6 12/16/2013 1350   K 4.8* 06/13/2009 1503   CL 104 12/16/2013 1350   CL 104 05/18/2013 1113   CL 108 06/13/2009 1503   CO2 26 04/25/2014 1104   CO2 24 12/16/2013 1350   CO2 29 06/13/2009 1503   GLUCOSE 292* 04/25/2014 1104   GLUCOSE 192* 12/16/2013 1350   GLUCOSE 325* 05/18/2013 1113   GLUCOSE 114 06/13/2009 1503   BUN 15.0 04/25/2014 1104   BUN 10 12/16/2013 1350   BUN 49* 06/13/2009 1503   CREATININE 1.5* 04/25/2014 1104   CREATININE 1.28 12/16/2013 1350   CREATININE 1.33 07/22/2012 1334   CREATININE 2.9* 06/13/2009 1503   CALCIUM 8.8 04/25/2014 1104   CALCIUM 7.7* 12/16/2013 1350   CALCIUM 10.5* 06/13/2009 1503   PROT 5.6* 04/25/2014 1104   PROT 5.1* 12/16/2013 1350   PROT 6.4 06/13/2009 1503   ALBUMIN 3.5 04/25/2014 1104   ALBUMIN 2.9* 12/16/2013  1350   AST 13 04/25/2014 1104   AST 26 12/16/2013 1350   AST 21 06/13/2009 1503   ALT 16 04/25/2014 1104   ALT <5 12/16/2013 1350   ALT 25 06/13/2009 1503   ALKPHOS 112 04/25/2014 1104   ALKPHOS 109 12/16/2013 1350   ALKPHOS 135* 06/13/2009 1503   BILITOT 0.88 04/25/2014 1104   BILITOT 0.5 12/16/2013 1350   BILITOT 0.60 06/13/2009 1503   GFRNONAA 54* 12/16/2013 1350   GFRAA 62* 12/16/2013 1350    No results found for this basename: SPEP,  UPEP,   kappa and lambda light chains    Lab Results  Component Value Date   WBC 3.2* 04/25/2014   NEUTROABS 2.3 04/25/2014   HGB 11.8* 04/25/2014   HCT 35.1* 04/25/2014   MCV 102.6* 04/25/2014   PLT 101* 04/25/2014      Chemistry      Component Value Date/Time   NA 142 04/25/2014 1104   NA 138 12/16/2013 1350   NA 145 06/13/2009 1503   K 3.6 04/25/2014 1104   K 3.6 12/16/2013 1350   K 4.8* 06/13/2009 1503   CL 104 12/16/2013 1350   CL 104 05/18/2013 1113   CL 108 06/13/2009 1503   CO2 26 04/25/2014 1104   CO2 24 12/16/2013 1350   CO2 29 06/13/2009 1503   BUN 15.0 04/25/2014 1104   BUN 10 12/16/2013 1350   BUN 49* 06/13/2009 1503   CREATININE 1.5* 04/25/2014 1104   CREATININE 1.28 12/16/2013 1350   CREATININE 1.33 07/22/2012 1334   CREATININE 2.9* 06/13/2009 1503      Component Value Date/Time   CALCIUM 8.8 04/25/2014 1104   CALCIUM 7.7* 12/16/2013 1350   CALCIUM 10.5* 06/13/2009 1503   ALKPHOS 112 04/25/2014 1104   ALKPHOS 109 12/16/2013 1350   ALKPHOS 135* 06/13/2009 1503   AST 13 04/25/2014 1104   AST 26 12/16/2013 1350   AST 21 06/13/2009 1503   ALT 16 04/25/2014 1104   ALT <5 12/16/2013 1350   ALT 25 06/13/2009 1503   BILITOT 0.88 04/25/2014 1104   BILITOT 0.5 12/16/2013 1350  BILITOT 0.60 06/13/2009 1503     ASSESSMENT & PLAN:  #1 relapsed multiple myeloma The patient has excellent response to treatment with reduction in serum lambda light chain level. We'll continue the same without dosage adjustment. #2 anemia #3 thrombocytopenia These are treatment  related side effects. He is not symptomatic. I will recommend we observed and transfuse as needed #4 chronic constipation I recommend him to continue laxative #5 poorly controlled diabetes I recommend adjustment in insulin dosage as outlined by his primary care physician #6 chronic back pain Continue on calcium and vitamin D supplementation. I plan on switching his treatment from oxycodone to MS Contin as this may be cheaper for the patient. 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 I recommend holding off taking furosemide and increase oral fluid intake.  Orders Placed This Encounter  Procedures  . CBC with Differential    Standing Status: Standing     Number of Occurrences: 22     Standing Expiration Date: 04/26/2015  . Comprehensive metabolic panel    Standing Status: Standing     Number of Occurrences: 22     Standing Expiration Date: 04/26/2015   All questions were answered. The patient knows to call the clinic with any problems, questions or concerns. No barriers to learning was detected.    Heath Lark, MD 04/25/2014 12:40 PM

## 2014-04-25 NOTE — Patient Instructions (Signed)

## 2014-04-25 NOTE — Telephone Encounter (Signed)
Per staff phone call and POF I have schedueld appts.  JMW  

## 2014-04-25 NOTE — Patient Instructions (Signed)
Carfilzomib injection What is this medicine? CARFILZOMIB (kar FILZ oh mib) is a chemotherapy drug that works by slowing or stopping cancer cell growth. This medicine is used to treat multiple myeloma. This medicine may be used for other purposes; ask your health care provider or pharmacist if you have questions. COMMON BRAND NAME(S): KYPROLIS What should I tell my health care provider before I take this medicine? They need to know if you have any of these conditions: -heart disease -irregular heartbeat -liver disease -lung or breathing disease -an unusual or allergic reaction to carfilzomib, or other medicines, foods, dyes, or preservatives -pregnant or trying to get pregnant -breast-feeding How should I use this medicine? This medicine is for injection or infusion into a vein. It is given by a health care professional in a hospital or clinic setting. Talk to your pediatrician regarding the use of this medicine in children. Special care may be needed. Overdosage: If you think you've taken too much of this medicine contact a poison control center or emergency room at once. Overdosage: If you think you have taken too much of this medicine contact a poison control center or emergency room at once. NOTE: This medicine is only for you. Do not share this medicine with others. What if I miss a dose? It is important not to miss your dose. Call your doctor or health care professional if you are unable to keep an appointment. What may interact with this medicine? Interactions are not expected. Give your health care provider a list of all the medicines, herbs, non-prescription drugs, or dietary supplements you use. Also tell them if you smoke, drink alcohol, or use illegal drugs. Some items may interact with your medicine. This list may not describe all possible interactions. Give your health care provider a list of all the medicines, herbs, non-prescription drugs, or dietary supplements you use. Also  tell them if you smoke, drink alcohol, or use illegal drugs. Some items may interact with your medicine. What should I watch for while using this medicine? Your condition will be monitored carefully while you are receiving this medicine. Report any side effects. Continue your course of treatment even though you feel ill unless your doctor tells you to stop. Call your doctor or health care professional for advice if you get a fever, chills or sore throat, or other symptoms of a cold or flu. Do not treat yourself. Try to avoid being around people who are sick. Do not become pregnant while taking this medicine. Women should inform their doctor if they wish to become pregnant or think they might be pregnant. There is a potential for serious side effects to an unborn child. Talk to your health care professional or pharmacist for more information. Do not breast-feed an infant while taking this medicine. Check with your doctor or health care professional if you get an attack of severe diarrhea, nausea and vomiting, or if you sweat a lot. The loss of too much body fluid can make it dangerous for you to take this medicine. You may get dizzy. Do not drive, use machinery, or do anything that needs mental alertness until you know how this medicine affects you. Do not stand or sit up quickly, especially if you are an older patient. This reduces the risk of dizzy or fainting spells. What side effects may I notice from receiving this medicine? Side effects that you should report to your doctor or health care professional as soon as possible: -allergic reactions like skin rash, itching or hives,  swelling of the face, lips, or tongue -breathing problems -chest pain or palpitationschest tightness -cough -dark urine -dizziness -feeling faint or lightheaded -fever or chills -general ill feeling or flu-like symptoms -light-colored stools -palpitations -right upper belly pain -swelling of the legs or ankles -unusual  bleeding or bruising -unusually weak or tired -yellowing of the eyes or skin  Side effects that usually do not require medical attention (Report these to your doctor or health care professional if they continue or are bothersome.): -diarrhea -headache -nausea, vomiting -tiredness This list may not describe all possible side effects. Call your doctor for medical advice about side effects. You may report side effects to FDA at 1-800-FDA-1088. Where should I keep my medicine? This drug is given in a hospital or clinic and will not be stored at home. NOTE: This sheet is a summary. It may not cover all possible information. If you have questions about this medicine, talk to your doctor, pharmacist, or health care provider.  2014, [employer]vier/Gold Standard. (2012-05-28 17:02:29)  

## 2014-04-26 ENCOUNTER — Ambulatory Visit (HOSPITAL_BASED_OUTPATIENT_CLINIC_OR_DEPARTMENT_OTHER): Payer: Medicare Other

## 2014-04-26 VITALS — BP 142/84 | HR 98 | Temp 98.5°F | Resp 18

## 2014-04-26 DIAGNOSIS — IMO0002 Reserved for concepts with insufficient information to code with codable children: Secondary | ICD-10-CM

## 2014-04-26 DIAGNOSIS — B029 Zoster without complications: Secondary | ICD-10-CM

## 2014-04-26 DIAGNOSIS — N189 Chronic kidney disease, unspecified: Secondary | ICD-10-CM

## 2014-04-26 DIAGNOSIS — Z5112 Encounter for antineoplastic immunotherapy: Secondary | ICD-10-CM

## 2014-04-26 DIAGNOSIS — C9002 Multiple myeloma in relapse: Secondary | ICD-10-CM

## 2014-04-26 MED ORDER — DEXAMETHASONE SODIUM PHOSPHATE 10 MG/ML IJ SOLN
10.0000 mg | Freq: Once | INTRAMUSCULAR | Status: AC
  Administered 2014-04-26: 10 mg via INTRAVENOUS

## 2014-04-26 MED ORDER — HEPARIN SOD (PORK) LOCK FLUSH 100 UNIT/ML IV SOLN
500.0000 [IU] | Freq: Once | INTRAVENOUS | Status: AC | PRN
  Administered 2014-04-26: 500 [IU]
  Filled 2014-04-26: qty 5

## 2014-04-26 MED ORDER — SODIUM CHLORIDE 0.9 % IV SOLN
Freq: Once | INTRAVENOUS | Status: AC
  Administered 2014-04-26: 12:00:00 via INTRAVENOUS

## 2014-04-26 MED ORDER — ONDANSETRON 8 MG/NS 50 ML IVPB
INTRAVENOUS | Status: AC
  Filled 2014-04-26: qty 8

## 2014-04-26 MED ORDER — SODIUM CHLORIDE 0.9 % IJ SOLN
10.0000 mL | INTRAMUSCULAR | Status: DC | PRN
  Administered 2014-04-26: 10 mL
  Filled 2014-04-26: qty 10

## 2014-04-26 MED ORDER — DEXAMETHASONE SODIUM PHOSPHATE 10 MG/ML IJ SOLN
INTRAMUSCULAR | Status: AC
  Filled 2014-04-26: qty 1

## 2014-04-26 MED ORDER — CARFILZOMIB CHEMO INJECTION 60 MG
27.0000 mg/m2 | Freq: Once | INTRAVENOUS | Status: AC
  Administered 2014-04-26: 60 mg via INTRAVENOUS
  Filled 2014-04-26: qty 30

## 2014-04-26 MED ORDER — ONDANSETRON 8 MG/50ML IVPB (CHCC)
8.0000 mg | Freq: Once | INTRAVENOUS | Status: AC
  Administered 2014-04-26: 8 mg via INTRAVENOUS

## 2014-04-26 NOTE — Patient Instructions (Signed)
[facility] Discharge Instructions for Patients Receiving Chemotherapy  Today you received the following chemotherapy agents Kyprolis To help prevent nausea and vomiting after your treatment, we encourage you to take your nausea medication as prescribed.  If you develop nausea and vomiting that is not controlled by your nausea medication, call the clinic.   BELOW ARE SYMPTOMS THAT SHOULD BE REPORTED IMMEDIATELY:  *FEVER GREATER THAN 100.5 F  *CHILLS WITH OR WITHOUT FEVER  NAUSEA AND VOMITING THAT IS NOT CONTROLLED WITH YOUR NAUSEA MEDICATION  *UNUSUAL SHORTNESS OF BREATH  *UNUSUAL BRUISING OR BLEEDING  TENDERNESS IN MOUTH AND THROAT WITH OR WITHOUT PRESENCE OF ULCERS  *URINARY PROBLEMS  *BOWEL PROBLEMS  UNUSUAL RASH Items with * indicate a potential emergency and should be followed up as soon as possible.  Feel free to call the clinic you have any questions or concerns. The clinic phone number is [phone].    

## 2014-04-28 LAB — SPEP & IFE WITH QIG
ALBUMIN ELP: 67.1 % — AB (ref 55.8–66.1)
Alpha-1-Globulin: 5.2 % — ABNORMAL HIGH (ref 2.9–4.9)
Alpha-2-Globulin: 10 % (ref 7.1–11.8)
BETA GLOBULIN: 6.6 % (ref 4.7–7.2)
Beta 2: 4.3 % (ref 3.2–6.5)
Gamma Globulin: 6.8 % — ABNORMAL LOW (ref 11.1–18.8)
IGA: 9 mg/dL — AB (ref 68–379)
IGG (IMMUNOGLOBIN G), SERUM: 305 mg/dL — AB (ref 650–1600)
IgM, Serum: 8 mg/dL — ABNORMAL LOW (ref 41–251)
M-SPIKE, %: 0.12 g/dL
Total Protein, Serum Electrophoresis: 5.5 g/dL — ABNORMAL LOW (ref 6.0–8.3)

## 2014-04-28 LAB — KAPPA/LAMBDA LIGHT CHAINS
KAPPA FREE LGHT CHN: 0.03 mg/dL — AB (ref 0.33–1.94)
Kappa:Lambda Ratio: 0 — ABNORMAL LOW (ref 0.26–1.65)
Lambda Free Lght Chn: 114 mg/dL — ABNORMAL HIGH (ref 0.57–2.63)

## 2014-05-02 ENCOUNTER — Ambulatory Visit (HOSPITAL_BASED_OUTPATIENT_CLINIC_OR_DEPARTMENT_OTHER): Payer: Medicare Other

## 2014-05-02 ENCOUNTER — Other Ambulatory Visit: Payer: Self-pay | Admitting: Hematology and Oncology

## 2014-05-02 ENCOUNTER — Other Ambulatory Visit: Payer: Self-pay | Admitting: Emergency Medicine

## 2014-05-02 VITALS — BP 132/70 | HR 81 | Temp 98.9°F | Resp 18

## 2014-05-02 VITALS — BP 147/73 | HR 86 | Temp 97.9°F | Resp 16

## 2014-05-02 DIAGNOSIS — C9002 Multiple myeloma in relapse: Secondary | ICD-10-CM

## 2014-05-02 DIAGNOSIS — D649 Anemia, unspecified: Secondary | ICD-10-CM

## 2014-05-02 DIAGNOSIS — N189 Chronic kidney disease, unspecified: Secondary | ICD-10-CM

## 2014-05-02 DIAGNOSIS — IMO0002 Reserved for concepts with insufficient information to code with codable children: Secondary | ICD-10-CM

## 2014-05-02 DIAGNOSIS — B029 Zoster without complications: Secondary | ICD-10-CM

## 2014-05-02 DIAGNOSIS — Z5112 Encounter for antineoplastic immunotherapy: Secondary | ICD-10-CM

## 2014-05-02 DIAGNOSIS — Z95828 Presence of other vascular implants and grafts: Secondary | ICD-10-CM

## 2014-05-02 DIAGNOSIS — D696 Thrombocytopenia, unspecified: Secondary | ICD-10-CM

## 2014-05-02 LAB — COMPREHENSIVE METABOLIC PANEL (CC13)
ALT: 16 U/L (ref 0–55)
ANION GAP: 10 meq/L (ref 3–11)
AST: 14 U/L (ref 5–34)
Albumin: 3.4 g/dL — ABNORMAL LOW (ref 3.5–5.0)
Alkaline Phosphatase: 108 U/L (ref 40–150)
BILIRUBIN TOTAL: 1.34 mg/dL — AB (ref 0.20–1.20)
BUN: 14.8 mg/dL (ref 7.0–26.0)
CALCIUM: 8.9 mg/dL (ref 8.4–10.4)
CO2: 25 meq/L (ref 22–29)
CREATININE: 1.2 mg/dL (ref 0.7–1.3)
Chloride: 109 mEq/L (ref 98–109)
GLUCOSE: 141 mg/dL — AB (ref 70–140)
Potassium: 3.7 mEq/L (ref 3.5–5.1)
Sodium: 144 mEq/L (ref 136–145)
TOTAL PROTEIN: 5.3 g/dL — AB (ref 6.4–8.3)

## 2014-05-02 LAB — CBC WITH DIFFERENTIAL/PLATELET
BASO%: 0.3 % (ref 0.0–2.0)
Basophils Absolute: 0 10*3/uL (ref 0.0–0.1)
EOS ABS: 0.1 10*3/uL (ref 0.0–0.5)
EOS%: 1.7 % (ref 0.0–7.0)
HEMATOCRIT: 33.8 % — AB (ref 38.4–49.9)
HEMOGLOBIN: 11.4 g/dL — AB (ref 13.0–17.1)
LYMPH#: 0.3 10*3/uL — AB (ref 0.9–3.3)
LYMPH%: 9.4 % — AB (ref 14.0–49.0)
MCH: 34.3 pg — ABNORMAL HIGH (ref 27.2–33.4)
MCHC: 33.7 g/dL (ref 32.0–36.0)
MCV: 102 fL — ABNORMAL HIGH (ref 79.3–98.0)
MONO#: 0.4 10*3/uL (ref 0.1–0.9)
MONO%: 12.2 % (ref 0.0–14.0)
NEUT%: 76.4 % — AB (ref 39.0–75.0)
NEUTROS ABS: 2.4 10*3/uL (ref 1.5–6.5)
PLATELETS: 80 10*3/uL — AB (ref 140–400)
RBC: 3.31 10*6/uL — ABNORMAL LOW (ref 4.20–5.82)
RDW: 13.7 % (ref 11.0–14.6)
WBC: 3.1 10*3/uL — AB (ref 4.0–10.3)

## 2014-05-02 MED ORDER — DEXTROSE 5 % IV SOLN
27.0000 mg/m2 | Freq: Once | INTRAVENOUS | Status: AC
  Administered 2014-05-02: 60 mg via INTRAVENOUS
  Filled 2014-05-02: qty 30

## 2014-05-02 MED ORDER — ONDANSETRON 8 MG/NS 50 ML IVPB
INTRAVENOUS | Status: AC
  Filled 2014-05-02: qty 8

## 2014-05-02 MED ORDER — ONDANSETRON 8 MG/50ML IVPB (CHCC)
8.0000 mg | Freq: Once | INTRAVENOUS | Status: AC
  Administered 2014-05-02: 8 mg via INTRAVENOUS

## 2014-05-02 MED ORDER — SODIUM CHLORIDE 0.9 % IV SOLN
Freq: Once | INTRAVENOUS | Status: AC
  Administered 2014-05-02: 12:00:00 via INTRAVENOUS

## 2014-05-02 MED ORDER — SODIUM CHLORIDE 0.9 % IJ SOLN
10.0000 mL | INTRAMUSCULAR | Status: DC | PRN
  Filled 2014-05-02: qty 10

## 2014-05-02 MED ORDER — DEXAMETHASONE SODIUM PHOSPHATE 10 MG/ML IJ SOLN
10.0000 mg | Freq: Once | INTRAMUSCULAR | Status: AC
  Administered 2014-05-02: 10 mg via INTRAVENOUS

## 2014-05-02 MED ORDER — DEXAMETHASONE SODIUM PHOSPHATE 10 MG/ML IJ SOLN
INTRAMUSCULAR | Status: AC
  Filled 2014-05-02: qty 1

## 2014-05-02 MED ORDER — HEPARIN SOD (PORK) LOCK FLUSH 100 UNIT/ML IV SOLN
500.0000 [IU] | Freq: Once | INTRAVENOUS | Status: AC | PRN
  Administered 2014-05-02: 500 [IU]
  Filled 2014-05-02: qty 5

## 2014-05-02 MED ORDER — DEXTROSE 5 % IV SOLN: 27.0000 mg/m2 | Freq: Once | INTRAVENOUS | Status: DC

## 2014-05-02 MED ORDER — SODIUM CHLORIDE 0.9 % IJ SOLN
10.0000 mL | INTRAMUSCULAR | Status: DC | PRN
  Administered 2014-05-02 (×2): 10 mL via INTRAVENOUS
  Filled 2014-05-02: qty 10

## 2014-05-02 NOTE — Patient Instructions (Signed)

## 2014-05-02 NOTE — Patient Instructions (Addendum)
You may restart your Lasix; take HALF a pill daily.  Take the Morphine IR; 15mg  tablet as needed for pain every 4-6 hours.  Continue to drink lots of fluids.   [facility] Discharge Instructions for Patients Receiving Chemotherapy  Today you received the following chemotherapy agents Kyprolis.  To help prevent nausea and vomiting after your treatment, we encourage you to take your nausea medication.   If you develop nausea and vomiting that is not controlled by your nausea medication, call the clinic.   BELOW ARE SYMPTOMS THAT SHOULD BE REPORTED IMMEDIATELY:  *FEVER GREATER THAN 100.5 F  *CHILLS WITH OR WITHOUT FEVER  NAUSEA AND VOMITING THAT IS NOT CONTROLLED WITH YOUR NAUSEA MEDICATION  *UNUSUAL SHORTNESS OF BREATH  *UNUSUAL BRUISING OR BLEEDING  TENDERNESS IN MOUTH AND THROAT WITH OR WITHOUT PRESENCE OF ULCERS  *URINARY PROBLEMS  *BOWEL PROBLEMS  UNUSUAL RASH Items with * indicate a potential emergency and should be followed up as soon as possible.  Feel free to call the clinic you have any questions or concerns. The clinic phone number is [phone].

## 2014-05-03 ENCOUNTER — Ambulatory Visit (HOSPITAL_BASED_OUTPATIENT_CLINIC_OR_DEPARTMENT_OTHER): Payer: Medicare Other

## 2014-05-03 VITALS — BP 159/80 | HR 80 | Temp 97.9°F

## 2014-05-03 DIAGNOSIS — C9002 Multiple myeloma in relapse: Secondary | ICD-10-CM

## 2014-05-03 DIAGNOSIS — IMO0002 Reserved for concepts with insufficient information to code with codable children: Secondary | ICD-10-CM

## 2014-05-03 DIAGNOSIS — B029 Zoster without complications: Secondary | ICD-10-CM

## 2014-05-03 DIAGNOSIS — N189 Chronic kidney disease, unspecified: Secondary | ICD-10-CM

## 2014-05-03 DIAGNOSIS — Z5112 Encounter for antineoplastic immunotherapy: Secondary | ICD-10-CM

## 2014-05-03 MED ORDER — SODIUM CHLORIDE 0.9 % IV SOLN
Freq: Once | INTRAVENOUS | Status: AC
  Administered 2014-05-03: 12:00:00 via INTRAVENOUS

## 2014-05-03 MED ORDER — ONDANSETRON 8 MG/NS 50 ML IVPB
INTRAVENOUS | Status: AC
  Filled 2014-05-03: qty 8

## 2014-05-03 MED ORDER — SODIUM CHLORIDE 0.9 % IV SOLN: Freq: Once | INTRAVENOUS | Status: DC

## 2014-05-03 MED ORDER — DEXAMETHASONE SODIUM PHOSPHATE 10 MG/ML IJ SOLN
10.0000 mg | Freq: Once | INTRAMUSCULAR | Status: AC
  Administered 2014-05-03: 10 mg via INTRAVENOUS

## 2014-05-03 MED ORDER — ONDANSETRON 8 MG/50ML IVPB (CHCC)
8.0000 mg | Freq: Once | INTRAVENOUS | Status: AC
  Administered 2014-05-03: 8 mg via INTRAVENOUS

## 2014-05-03 MED ORDER — DEXTROSE 5 % IV SOLN
27.0000 mg/m2 | Freq: Once | INTRAVENOUS | Status: AC
  Administered 2014-05-03: 60 mg via INTRAVENOUS
  Filled 2014-05-03: qty 30

## 2014-05-03 MED ORDER — DEXAMETHASONE SODIUM PHOSPHATE 10 MG/ML IJ SOLN
INTRAMUSCULAR | Status: AC
  Filled 2014-05-03: qty 1

## 2014-05-03 MED ORDER — HEPARIN SOD (PORK) LOCK FLUSH 100 UNIT/ML IV SOLN
500.0000 [IU] | Freq: Once | INTRAVENOUS | Status: AC | PRN
  Administered 2014-05-03: 500 [IU]
  Filled 2014-05-03: qty 5

## 2014-05-03 MED ORDER — SODIUM CHLORIDE 0.9 % IJ SOLN
10.0000 mL | INTRAMUSCULAR | Status: DC | PRN
  Administered 2014-05-03: 10 mL
  Filled 2014-05-03: qty 10

## 2014-05-03 NOTE — Patient Instructions (Signed)
[facility] Discharge Instructions for Patients Receiving Chemotherapy  Today you received the following chemotherapy agents Kyprolis To help prevent nausea and vomiting after your treatment, we encourage you to take your nausea medication as prescribed.  If you develop nausea and vomiting that is not controlled by your nausea medication, call the clinic.   BELOW ARE SYMPTOMS THAT SHOULD BE REPORTED IMMEDIATELY:  *FEVER GREATER THAN 100.5 F  *CHILLS WITH OR WITHOUT FEVER  NAUSEA AND VOMITING THAT IS NOT CONTROLLED WITH YOUR NAUSEA MEDICATION  *UNUSUAL SHORTNESS OF BREATH  *UNUSUAL BRUISING OR BLEEDING  TENDERNESS IN MOUTH AND THROAT WITH OR WITHOUT PRESENCE OF ULCERS  *URINARY PROBLEMS  *BOWEL PROBLEMS  UNUSUAL RASH Items with * indicate a potential emergency and should be followed up as soon as possible.  Feel free to call the clinic you have any questions or concerns. The clinic phone number is [phone].    

## 2014-05-09 ENCOUNTER — Other Ambulatory Visit: Payer: Self-pay | Admitting: Hematology and Oncology

## 2014-05-09 ENCOUNTER — Ambulatory Visit (HOSPITAL_BASED_OUTPATIENT_CLINIC_OR_DEPARTMENT_OTHER): Payer: Medicare Other

## 2014-05-09 DIAGNOSIS — C9002 Multiple myeloma in relapse: Secondary | ICD-10-CM

## 2014-05-09 DIAGNOSIS — Z95828 Presence of other vascular implants and grafts: Secondary | ICD-10-CM

## 2014-05-09 DIAGNOSIS — Z452 Encounter for adjustment and management of vascular access device: Secondary | ICD-10-CM

## 2014-05-09 LAB — COMPREHENSIVE METABOLIC PANEL (CC13)
ALT: 19 U/L (ref 0–55)
AST: 16 U/L (ref 5–34)
Albumin: 3.3 g/dL — ABNORMAL LOW (ref 3.5–5.0)
Alkaline Phosphatase: 87 U/L (ref 40–150)
Anion Gap: 9 mEq/L (ref 3–11)
BILIRUBIN TOTAL: 1.42 mg/dL — AB (ref 0.20–1.20)
BUN: 18.8 mg/dL (ref 7.0–26.0)
CALCIUM: 8.5 mg/dL (ref 8.4–10.4)
CHLORIDE: 108 meq/L (ref 98–109)
CO2: 26 meq/L (ref 22–29)
CREATININE: 1.2 mg/dL (ref 0.7–1.3)
GLUCOSE: 154 mg/dL — AB (ref 70–140)
Potassium: 3.5 mEq/L (ref 3.5–5.1)
Sodium: 143 mEq/L (ref 136–145)
Total Protein: 5.3 g/dL — ABNORMAL LOW (ref 6.4–8.3)

## 2014-05-09 LAB — CBC WITH DIFFERENTIAL/PLATELET
BASO%: 0.3 % (ref 0.0–2.0)
BASOS ABS: 0 10*3/uL (ref 0.0–0.1)
EOS%: 1.7 % (ref 0.0–7.0)
Eosinophils Absolute: 0.1 10*3/uL (ref 0.0–0.5)
HEMATOCRIT: 33.2 % — AB (ref 38.4–49.9)
HEMOGLOBIN: 11.5 g/dL — AB (ref 13.0–17.1)
LYMPH%: 9.5 % — AB (ref 14.0–49.0)
MCH: 34.3 pg — ABNORMAL HIGH (ref 27.2–33.4)
MCHC: 34.6 g/dL (ref 32.0–36.0)
MCV: 99.1 fL — ABNORMAL HIGH (ref 79.3–98.0)
MONO#: 0.3 10*3/uL (ref 0.1–0.9)
MONO%: 9.2 % (ref 0.0–14.0)
NEUT#: 2.7 10*3/uL (ref 1.5–6.5)
NEUT%: 79.3 % — AB (ref 39.0–75.0)
PLATELETS: 67 10*3/uL — AB (ref 140–400)
RBC: 3.35 10*6/uL — ABNORMAL LOW (ref 4.20–5.82)
RDW: 13.9 % (ref 11.0–14.6)
WBC: 3.5 10*3/uL — AB (ref 4.0–10.3)
lymph#: 0.3 10*3/uL — ABNORMAL LOW (ref 0.9–3.3)

## 2014-05-09 MED ORDER — HEPARIN SOD (PORK) LOCK FLUSH 100 UNIT/ML IV SOLN
500.0000 [IU] | Freq: Once | INTRAVENOUS | Status: AC
  Administered 2014-05-09: 500 [IU] via INTRAVENOUS
  Filled 2014-05-09: qty 5

## 2014-05-09 MED ORDER — SODIUM CHLORIDE 0.9 % IJ SOLN
10.0000 mL | INTRAMUSCULAR | Status: AC | PRN
  Administered 2014-05-09: 10 mL via INTRAVENOUS
  Filled 2014-05-09: qty 10

## 2014-05-09 MED ORDER — SODIUM CHLORIDE 0.9 % IJ SOLN
10.0000 mL | Freq: Once | INTRAMUSCULAR | Status: AC
  Administered 2014-05-09: 10 mL via INTRAVENOUS
  Filled 2014-05-09: qty 10

## 2014-05-09 NOTE — Progress Notes (Signed)
Patient's platelets 67. Per Dr. Alvy Bimler, patient is to skip this week, return 05/23/14 for labs and chemo. Port deaccessed in flush room prior to d/c. Patient and wife to call with problems with bleeding. Wife is aware of patient's appts.

## 2014-05-10 ENCOUNTER — Ambulatory Visit: Payer: Medicare Other

## 2014-05-10 ENCOUNTER — Telehealth: Payer: Self-pay | Admitting: *Deleted

## 2014-05-10 NOTE — Telephone Encounter (Signed)
Pt's daughter, [name] [name],  left VM this morning asking for clarification on why pt's chemo was held this week. Her ph #[phone].  She said her mother didn't really understand and was concerned.   Called pt's wife and explained reason for holding chemotherapy due to low platelet count.  This is not uncommon, but we will check his counts again and hopefully resume treatment.  Explained plan is to continue treatment, but adjustments may have to be made to protect pt's blood counts.  Wife verbalized understanding and gave permission to speak w/ her daughter.  Called dau, [name] [name], and left her VM informing I explained to her mother and for her to call her mother, but also to call us back tomorrow if needs any further clarification.

## 2014-05-12 ENCOUNTER — Telehealth: Payer: Self-pay | Admitting: *Deleted

## 2014-05-12 NOTE — Telephone Encounter (Signed)
Daughter, Humberto Seals, left VM asking to speak w/ Dr. Alvy Bimler.  She lives in [place] and unable to come on visits w/ pt..   This RN did call daughter last week to explain reason chemo was held last week, dau understands but would like to know how pt is doing from Dr. Alvy Bimler.  This RN did get permission from pt's wife to speak w/ daughter.  Her phone [phone].

## 2014-05-16 ENCOUNTER — Telehealth: Payer: Self-pay | Admitting: Hematology and Oncology

## 2014-05-16 NOTE — Telephone Encounter (Signed)
I spoke with her daughter. I gave her an update on his progress. The reason we withhold his treatment recently was due to pancytopenia. His light chains fluctuate up and down depending on his kidney function. When his creatinine is down, his light chain improves. I recommend we continue on current treatment for now. I addressed all her questions and concerns.

## 2014-05-23 ENCOUNTER — Telehealth: Payer: Self-pay | Admitting: Hematology and Oncology

## 2014-05-23 ENCOUNTER — Ambulatory Visit (HOSPITAL_BASED_OUTPATIENT_CLINIC_OR_DEPARTMENT_OTHER): Payer: Medicare Other | Admitting: Hematology and Oncology

## 2014-05-23 ENCOUNTER — Ambulatory Visit: Payer: Medicare Other

## 2014-05-23 ENCOUNTER — Encounter: Payer: Self-pay | Admitting: Hematology and Oncology

## 2014-05-23 ENCOUNTER — Ambulatory Visit (HOSPITAL_BASED_OUTPATIENT_CLINIC_OR_DEPARTMENT_OTHER): Payer: Medicare Other

## 2014-05-23 VITALS — BP 131/65 | HR 83 | Temp 98.5°F | Resp 18 | Ht 70.0 in | Wt 234.9 lb

## 2014-05-23 DIAGNOSIS — Z5112 Encounter for antineoplastic immunotherapy: Secondary | ICD-10-CM

## 2014-05-23 DIAGNOSIS — G620 Drug-induced polyneuropathy: Secondary | ICD-10-CM

## 2014-05-23 DIAGNOSIS — C9002 Multiple myeloma in relapse: Secondary | ICD-10-CM

## 2014-05-23 DIAGNOSIS — B029 Zoster without complications: Secondary | ICD-10-CM

## 2014-05-23 DIAGNOSIS — N189 Chronic kidney disease, unspecified: Secondary | ICD-10-CM

## 2014-05-23 DIAGNOSIS — B3789 Other sites of candidiasis: Secondary | ICD-10-CM

## 2014-05-23 DIAGNOSIS — D630 Anemia in neoplastic disease: Secondary | ICD-10-CM | POA: Insufficient documentation

## 2014-05-23 DIAGNOSIS — T50905A Adverse effect of unspecified drugs, medicaments and biological substances, initial encounter: Secondary | ICD-10-CM

## 2014-05-23 DIAGNOSIS — R600 Localized edema: Secondary | ICD-10-CM | POA: Insufficient documentation

## 2014-05-23 DIAGNOSIS — IMO0002 Reserved for concepts with insufficient information to code with codable children: Secondary | ICD-10-CM

## 2014-05-23 DIAGNOSIS — M549 Dorsalgia, unspecified: Secondary | ICD-10-CM | POA: Insufficient documentation

## 2014-05-23 DIAGNOSIS — B379 Candidiasis, unspecified: Secondary | ICD-10-CM | POA: Insufficient documentation

## 2014-05-23 DIAGNOSIS — T451X5A Adverse effect of antineoplastic and immunosuppressive drugs, initial encounter: Secondary | ICD-10-CM

## 2014-05-23 DIAGNOSIS — Z95828 Presence of other vascular implants and grafts: Secondary | ICD-10-CM

## 2014-05-23 DIAGNOSIS — E1140 Type 2 diabetes mellitus with diabetic neuropathy, unspecified: Secondary | ICD-10-CM

## 2014-05-23 DIAGNOSIS — E1142 Type 2 diabetes mellitus with diabetic polyneuropathy: Secondary | ICD-10-CM

## 2014-05-23 DIAGNOSIS — D6959 Other secondary thrombocytopenia: Secondary | ICD-10-CM | POA: Insufficient documentation

## 2014-05-23 DIAGNOSIS — R609 Edema, unspecified: Secondary | ICD-10-CM

## 2014-05-23 DIAGNOSIS — E1149 Type 2 diabetes mellitus with other diabetic neurological complication: Secondary | ICD-10-CM

## 2014-05-23 LAB — COMPREHENSIVE METABOLIC PANEL (CC13)
ALBUMIN: 3.7 g/dL (ref 3.5–5.0)
AST: 14 U/L (ref 5–34)
Alkaline Phosphatase: 111 U/L (ref 40–150)
Anion Gap: 13 mEq/L — ABNORMAL HIGH (ref 3–11)
BUN: 18 mg/dL (ref 7.0–26.0)
CALCIUM: 8.3 mg/dL — AB (ref 8.4–10.4)
CO2: 22 meq/L (ref 22–29)
CREATININE: 1.4 mg/dL — AB (ref 0.7–1.3)
Chloride: 109 mEq/L (ref 98–109)
Glucose: 169 mg/dl — ABNORMAL HIGH (ref 70–140)
POTASSIUM: 3.8 meq/L (ref 3.5–5.1)
Sodium: 143 mEq/L (ref 136–145)
Total Bilirubin: 0.91 mg/dL (ref 0.20–1.20)
Total Protein: 5.9 g/dL — ABNORMAL LOW (ref 6.4–8.3)

## 2014-05-23 LAB — CBC WITH DIFFERENTIAL/PLATELET
BASO%: 0.3 % (ref 0.0–2.0)
BASOS ABS: 0 10*3/uL (ref 0.0–0.1)
EOS%: 1.7 % (ref 0.0–7.0)
Eosinophils Absolute: 0.1 10*3/uL (ref 0.0–0.5)
HEMATOCRIT: 36.4 % — AB (ref 38.4–49.9)
HEMOGLOBIN: 12.4 g/dL — AB (ref 13.0–17.1)
LYMPH%: 17.7 % (ref 14.0–49.0)
MCH: 34.4 pg — ABNORMAL HIGH (ref 27.2–33.4)
MCHC: 34.1 g/dL (ref 32.0–36.0)
MCV: 101.1 fL — ABNORMAL HIGH (ref 79.3–98.0)
MONO#: 0.4 10*3/uL (ref 0.1–0.9)
MONO%: 12.2 % (ref 0.0–14.0)
NEUT%: 68.1 % (ref 39.0–75.0)
NEUTROS ABS: 2.4 10*3/uL (ref 1.5–6.5)
Platelets: 79 10*3/uL — ABNORMAL LOW (ref 140–400)
RBC: 3.6 10*6/uL — ABNORMAL LOW (ref 4.20–5.82)
RDW: 13.6 % (ref 11.0–14.6)
WBC: 3.5 10*3/uL — AB (ref 4.0–10.3)
lymph#: 0.6 10*3/uL — ABNORMAL LOW (ref 0.9–3.3)

## 2014-05-23 MED ORDER — SODIUM CHLORIDE 0.9 % IV SOLN: Freq: Once | INTRAVENOUS | Status: DC

## 2014-05-23 MED ORDER — FUROSEMIDE 20 MG PO TABS: 20.0000 mg | ORAL_TABLET | Freq: Every day | ORAL | Status: DC

## 2014-05-23 MED ORDER — NYSTATIN 100000 UNIT/GM EX CREA: 1.0000 "application " | TOPICAL_CREAM | Freq: Two times a day (BID) | CUTANEOUS | Status: DC

## 2014-05-23 MED ORDER — MORPHINE SULFATE 15 MG PO TABS: 15.0000 mg | ORAL_TABLET | ORAL | Status: AC | PRN

## 2014-05-23 MED ORDER — ONDANSETRON 8 MG/NS 50 ML IVPB
INTRAVENOUS | Status: AC
  Filled 2014-05-23: qty 8

## 2014-05-23 MED ORDER — MORPHINE SULFATE ER 30 MG PO TBCR: 30.0000 mg | EXTENDED_RELEASE_TABLET | Freq: Two times a day (BID) | ORAL | Status: DC

## 2014-05-23 MED ORDER — SODIUM CHLORIDE 0.9 % IJ SOLN
10.0000 mL | INTRAMUSCULAR | Status: DC | PRN
  Administered 2014-05-23: 10 mL via INTRAVENOUS
  Filled 2014-05-23: qty 10

## 2014-05-23 MED ORDER — HEPARIN SOD (PORK) LOCK FLUSH 100 UNIT/ML IV SOLN
500.0000 [IU] | Freq: Once | INTRAVENOUS | Status: AC
  Administered 2014-05-23: 500 [IU] via INTRAVENOUS
  Filled 2014-05-23: qty 5

## 2014-05-23 MED ORDER — CARFILZOMIB CHEMO INJECTION 60 MG
27.0000 mg/m2 | Freq: Once | INTRAVENOUS | Status: AC
  Administered 2014-05-23: 60 mg via INTRAVENOUS
  Filled 2014-05-23: qty 30

## 2014-05-23 MED ORDER — HEPARIN SOD (PORK) LOCK FLUSH 100 UNIT/ML IV SOLN
500.0000 [IU] | Freq: Once | INTRAVENOUS | Status: AC | PRN
Start: 2014-05-23 — End: 2014-05-23
  Administered 2014-05-23: 500 [IU]
  Filled 2014-05-23: qty 5

## 2014-05-23 MED ORDER — DEXAMETHASONE SODIUM PHOSPHATE 10 MG/ML IJ SOLN
10.0000 mg | Freq: Once | INTRAMUSCULAR | Status: AC
  Administered 2014-05-23: 10 mg via INTRAVENOUS

## 2014-05-23 MED ORDER — SODIUM CHLORIDE 0.9 % IJ SOLN
10.0000 mL | INTRAMUSCULAR | Status: DC | PRN
  Administered 2014-05-23: 10 mL
  Filled 2014-05-23: qty 10

## 2014-05-23 MED ORDER — SODIUM CHLORIDE 0.9 % IV SOLN
Freq: Once | INTRAVENOUS | Status: AC
  Administered 2014-05-23: 13:00:00 via INTRAVENOUS

## 2014-05-23 MED ORDER — ONDANSETRON 8 MG/50ML IVPB (CHCC)
8.0000 mg | Freq: Once | INTRAVENOUS | Status: AC
  Administered 2014-05-23: 8 mg via INTRAVENOUS

## 2014-05-23 MED ORDER — VALACYCLOVIR HCL 500 MG PO TABS: 500.0000 mg | ORAL_TABLET | Freq: Every day | ORAL | Status: DC

## 2014-05-23 MED ORDER — PREGABALIN 100 MG PO CAPS: 100.0000 mg | ORAL_CAPSULE | Freq: Two times a day (BID) | ORAL | Status: DC

## 2014-05-23 MED ORDER — DEXAMETHASONE SODIUM PHOSPHATE 10 MG/ML IJ SOLN
INTRAMUSCULAR | Status: AC
  Filled 2014-05-23: qty 1

## 2014-05-23 NOTE — Progress Notes (Signed)
1240-OK to treat with platelet count of 79 per Dr. Alvy Bimler.

## 2014-05-23 NOTE — Assessment & Plan Note (Signed)
This is likely due to recent treatment. The patient denies recent history of bleeding such as epistaxis, hematuria or hematochezia. He is asymptomatic from the anemia. I will observe for now.  He does not require transfusion now. I will continue the chemotherapy at current dose without dosage adjustment.  If the anemia gets progressive worse in the future, I might have to delay his treatment or adjust the chemotherapy dose.  

## 2014-05-23 NOTE — Progress Notes (Signed)
[facility] OFFICE PROGRESS NOTE  Patient Care Team: Kandice Hams, MD as PCP - General (Internal Medicine) Annia Belt, MD as Consulting Physician (Oncology) Annia Belt, MD as Consulting Physician (Internal Medicine)  SUMMARY OF ONCOLOGIC HISTORY:  He was initially diagnosed in June 2010. He had a heavy myeloma burden at time of diagnosis with multiple lytic bone lesions, 87% plasma cells in the bone marrow, and 6.7 g of protein in the urine. 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 He went on to receive high-dose IV melphalan with autologous stem cell support at [facility] 03/12/2010. We began to see small amounts of monoclonal lambda free light chains again in his urine in March of 2012. He was started on Revlimid 10 mg daily beginning in April 2012. Dose had to be adjusted due to myelosuppression down to 5 mg. At time of a visit here in December 2013, he was complaining of recurrent pain in his shoulders, bilateral ribs, and low back. Lab showed rise in his serum and urine paraprotein levels. He was restaged with a bone marrow biopsy on 12/31/2012 which showed 10% plasma cells, rise in total urine protein from 260 mg to 1039 mg, and rise in serum free lambda light chains from 4.8 mg percent in July 2013,to 10 mg percent on October 29, to a 25.8 mg percent by December 16, to 58.5 mg percent by 01/07/2013. He was then started him on pomalidomide on 01/28/2013. Initial dose 3 mg 21 days on 7 days rest.  He tolerated the drug well. Unfortunately, at time of his visit here on August 2014 he appeared to be breaking through the pomalidomide. Although his hematologic profile remained stable, there was a progressive rise in the lambda free light chains up to 116 mg percent as of 07/26/2013 compared with 58 on May 28. 24 hour urine total protein back up to 6645 mg. After a lengthy discussion with  respect to treatment options, he was started on trial of Bendamustine plus prednisone. This was started on September 4. He had 6 cycles through  02/01/2014. He appeared to have a initial improvement with fall in his serum lambda free light chains from 116 mg percent down to 91 by November 2014, fall in 24 hour urine protein from 6.6 g down to 5.4, fall in his creatinine from 1.4 down to 1.1. Unfortunately, recent reevaluation done on February 01, 2014 shows rise in the serum light chains up to 261 mg percent, urine 24-hour protein up to 14.4 g, and creatinine up to 1.3.  On 02/28/2014, he was started on a trial of kyprolis. Repeat serum lambda light chain on 04/10/2014 showed that the patient is responding to treatment.   INTERVAL HISTORY: Please see below for problem oriented charting. The patient continued to feel weak. He has skin lesion in his armpit that is itchy. His pain is under excellent control. He is seen today prior to cycle 4 of chemotherapy. REVIEW OF SYSTEMS:   Constitutional: Denies fevers, chills or abnormal weight loss Eyes: Denies blurriness of vision Ears, nose, mouth, throat, and face: Denies mucositis or sore throat Respiratory: Denies cough, dyspnea or wheezes Cardiovascular: Denies palpitation, chest discomfort or lower extremity swelling Gastrointestinal:  Denies nausea, heartburn or change in bowel habits Lymphatics: Denies new lymphadenopathy or easy bruising Neurological:Denies numbness, tingling or new weaknesses Behavioral/Psych: Mood is stable, no new changes  All other systems were  reviewed with the patient and are negative.  I have reviewed the past medical history, past surgical history, social history and family history with the patient and they are unchanged from previous note.  ALLERGIES:  has No Known Allergies.  MEDICATIONS:  Current Outpatient Prescriptions  Medication Sig Dispense Refill  . acetaminophen (TYLENOL) 500 MG tablet Take 500 mg by mouth  every 6 (six) hours as needed. For pain.      Marland Kitchen aspirin EC 81 MG tablet Take 81 mg by mouth daily after breakfast.       . atorvastatin (LIPITOR) 10 MG tablet Take 10 mg by mouth at bedtime.        . BD ULTRA-FINE PEN NEEDLES 29G X 12.7MM MISC Inject 30 Units into the skin daily.       . carbidopa-levodopa (SINEMET IR) 25-100 MG per tablet TAKE 1 TABLET 3 TIMES A DAY  90 tablet  6  . furosemide (LASIX) 20 MG tablet Take 1 tablet (20 mg total) by mouth daily.  30 tablet  3  . Insulin Glargine (LANTUS SOLOSTAR [place]) Inject 20-30 Units into the skin every evening.       . lidocaine-prilocaine (EMLA) cream Apply 1 application topically as needed. Apply to port at least 1 hour before procedure & cover.  30 g  1  . morphine (MS CONTIN) 30 MG 12 hr tablet Take 1 tablet (30 mg total) by mouth every 12 (twelve) hours.  60 tablet  0  . morphine (MSIR) 15 MG tablet Take 1 tablet (15 mg total) by mouth every 4 (four) hours as needed for severe pain.  90 tablet  0  . nystatin cream (MYCOSTATIN) Apply 1 application topically 2 (two) times daily.  30 g  0  . ondansetron (ZOFRAN ODT) 8 MG disintegrating tablet Take 1 tablet (8 mg total) by mouth every 8 (eight) hours as needed for nausea.  30 tablet  prn  . polyethylene glycol (MIRALAX / GLYCOLAX) packet Take 17 g by mouth daily after breakfast.       . pregabalin (LYRICA) 100 MG capsule Take 1 capsule (100 mg total) by mouth 2 (two) times daily.  60 capsule  1  . rasagiline (AZILECT) 1 MG TABS tablet Take 1 tablet (1 mg total) by mouth daily.  30 tablet  6  . valACYclovir (VALTREX) 500 MG tablet Take 1 tablet (500 mg total) by mouth daily. Takes once daily.  90 tablet  3   No current facility-administered medications for this visit.   Facility-Administered Medications Ordered in Other Visits  Medication Dose Route Frequency Provider Last Rate Last Dose  . 0.9 %  sodium chloride infusion   Intravenous Once Heath Lark, MD      . carfilzomib (KYPROLIS) 60 mg in  dextrose 5 % 50 mL chemo infusion  27 mg/m2 (Treatment Plan Actual) Intravenous Once Heath Lark, MD 160 mL/hr at 05/23/14 1331 60 mg at 05/23/14 1331  . heparin lock flush 100 unit/mL  500 Units Intracatheter Once PRN Heath Lark, MD      . sodium chloride 0.9 % injection 10 mL  10 mL Intravenous PRN Wayde Gopaul, MD   10 mL at 05/02/14 1410  . sodium chloride 0.9 % injection 10 mL  10 mL Intravenous PRN Heath Lark, MD   10 mL at 05/09/14 1045  . sodium chloride 0.9 % injection 10 mL  10 mL Intracatheter PRN Heath Lark, MD        PHYSICAL EXAMINATION: ECOG PERFORMANCE  STATUS: 2 - Symptomatic, <50% confined to bed  Filed Vitals:   05/23/14 1154  BP: 131/65  Pulse: 83  Temp: 98.5 F (36.9 C)  Resp: 18   Filed Weights   05/23/14 1154  Weight: 234 lb 14.4 oz (106.55 kg)    GENERAL:alert, no distress and comfortable. He looks obese and debilitated SKIN: He has skin rash under his armpit consistent with yeast infection EYES: normal, Conjunctiva are pink and non-injected, sclera clear OROPHARYNX:no exudate, no erythema and lips, buccal mucosa, and tongue normal  NECK: supple, thyroid normal size, non-tender, without nodularity LYMPH:  no palpable lymphadenopathy in the cervical, axillary or inguinal LUNGS: clear to auscultation and percussion with normal breathing effort HEART: regular rate & rhythm and no murmurs with moderate bilateral extremity edema ABDOMEN:abdomen soft, non-tender and normal bowel sounds Musculoskeletal:no cyanosis of digits and no clubbing  NEURO: alert & oriented x 3 with fluent speech, no focal motor/sensory deficits  LABORATORY DATA:  I have reviewed the data as listed    Component Value Date/Time   NA 143 05/23/2014 1123   NA 138 12/16/2013 1350   NA 145 06/13/2009 1503   K 3.8 05/23/2014 1123   K 3.6 12/16/2013 1350   K 4.8* 06/13/2009 1503   CL 104 12/16/2013 1350   CL 104 05/18/2013 1113   CL 108 06/13/2009 1503   CO2 22 05/23/2014 1123   CO2 24 12/16/2013  1350   CO2 29 06/13/2009 1503   GLUCOSE 169* 05/23/2014 1123   GLUCOSE 192* 12/16/2013 1350   GLUCOSE 325* 05/18/2013 1113   GLUCOSE 114 06/13/2009 1503   BUN 18.0 05/23/2014 1123   BUN 10 12/16/2013 1350   BUN 49* 06/13/2009 1503   CREATININE 1.4* 05/23/2014 1123   CREATININE 1.28 12/16/2013 1350   CREATININE 1.33 07/22/2012 1334   CREATININE 2.9* 06/13/2009 1503   CALCIUM 8.3* 05/23/2014 1123   CALCIUM 7.7* 12/16/2013 1350   CALCIUM 10.5* 06/13/2009 1503   PROT 5.9* 05/23/2014 1123   PROT 5.1* 12/16/2013 1350   PROT 6.4 06/13/2009 1503   ALBUMIN 3.7 05/23/2014 1123   ALBUMIN 2.9* 12/16/2013 1350   AST 14 05/23/2014 1123   AST 26 12/16/2013 1350   AST 21 06/13/2009 1503   ALT <6 05/23/2014 1123   ALT <5 12/16/2013 1350   ALT 25 06/13/2009 1503   ALKPHOS 111 05/23/2014 1123   ALKPHOS 109 12/16/2013 1350   ALKPHOS 135* 06/13/2009 1503   BILITOT 0.91 05/23/2014 1123   BILITOT 0.5 12/16/2013 1350   BILITOT 0.60 06/13/2009 1503   GFRNONAA 54* 12/16/2013 1350   GFRAA 62* 12/16/2013 1350    No results found for this basename: SPEP,  UPEP,   kappa and lambda light chains    Lab Results  Component Value Date   WBC 3.5* 05/23/2014   NEUTROABS 2.4 05/23/2014   HGB 12.4* 05/23/2014   HCT 36.4* 05/23/2014   MCV 101.1* 05/23/2014   PLT 79* 05/23/2014      Chemistry      Component Value Date/Time   NA 143 05/23/2014 1123   NA 138 12/16/2013 1350   NA 145 06/13/2009 1503   K 3.8 05/23/2014 1123   K 3.6 12/16/2013 1350   K 4.8* 06/13/2009 1503   CL 104 12/16/2013 1350   CL 104 05/18/2013 1113   CL 108 06/13/2009 1503   CO2 22 05/23/2014 1123   CO2 24 12/16/2013 1350   CO2 29 06/13/2009 1503   BUN 18.0 05/23/2014 1123  BUN 10 12/16/2013 1350   BUN 49* 06/13/2009 1503   CREATININE 1.4* 05/23/2014 1123   CREATININE 1.28 12/16/2013 1350   CREATININE 1.33 07/22/2012 1334   CREATININE 2.9* 06/13/2009 1503      Component Value Date/Time   CALCIUM 8.3* 05/23/2014 1123   CALCIUM 7.7* 12/16/2013 1350   CALCIUM 10.5* 06/13/2009 1503    ALKPHOS 111 05/23/2014 1123   ALKPHOS 109 12/16/2013 1350   ALKPHOS 135* 06/13/2009 1503   AST 14 05/23/2014 1123   AST 26 12/16/2013 1350   AST 21 06/13/2009 1503   ALT <6 05/23/2014 1123   ALT <5 12/16/2013 1350   ALT 25 06/13/2009 1503   BILITOT 0.91 05/23/2014 1123   BILITOT 0.5 12/16/2013 1350   BILITOT 0.60 06/13/2009 1503     ASSESSMENT & PLAN:  Multiple myeloma in relapse I will proceed with treatment without dosage adjustment. I will recheck his serum light chain again with the next visit.  Yeast infection This is present in his armpits, likely related to poorly controlled diabetes. I prescribed nystatin cream for him to use.  Anemia in neoplastic disease This is likely due to recent treatment. The patient denies recent history of bleeding such as epistaxis, hematuria or hematochezia. He is asymptomatic from the anemia. I will observe for now.  He does not require transfusion now. I will continue the chemotherapy at current dose without dosage adjustment.  If the anemia gets progressive worse in the future, I might have to delay his treatment or adjust the chemotherapy dose.   Leg edema I recommend he contacts his cardiologist for management. I recommend he use Lasix sparingly due to elevated serum creatinine.  CRI (chronic renal insufficiency) I recommend reducing the frequency of Lasix use to prevent risk of dehydration. I recommend we continue to followup closely.  Back pain I refilled his prescription pain medicines and cautioned him about risk of sedation and constipation. He will continue with calcium and vitamin D supplementation. Intravenous bisphosphonates were not prescribe due to poor dentition.  Diabetic neuropathy, type II diabetes mellitus He will continue on Lyrica, and pain medicine for this.  Thrombocytopenia due to drugs This is likely due to recent treatment. The patient denies recent history of bleeding such as epistaxis, hematuria or hematochezia. He is  asymptomatic from the low platelet count. I will observe for now.  he does not require transfusion now. I will continue the chemotherapy at current dose without dosage adjustment.  If the thrombocytopenia gets progressive worse in the future, I might have to delay his treatment or adjust the chemotherapy dose.      Orders Placed This Encounter  Procedures  . SPEP & IFE with QIG    Standing Status: Future     Number of Occurrences:      Standing Expiration Date: 05/24/2015  . Kappa/lambda light chains    Standing Status: Future     Number of Occurrences:      Standing Expiration Date: 05/24/2015  . Beta 2 microglobulin, serum    Standing Status: Future     Number of Occurrences:      Standing Expiration Date: 05/24/2015   All questions were answered. The patient knows to call the clinic with any problems, questions or concerns. No barriers to learning was detected. I spent 40 minutes counseling the patient face to face. The total time spent in the appointment was 55 minutes and more than 50% was on counseling and review of test results  Heath Lark, MD 05/23/2014 1:46 PM

## 2014-05-23 NOTE — Assessment & Plan Note (Signed)
I recommend he contacts his cardiologist for management. I recommend he use Lasix sparingly due to elevated serum creatinine.

## 2014-05-23 NOTE — Assessment & Plan Note (Signed)
This is present in his armpits, likely related to poorly controlled diabetes. I prescribed nystatin cream for him to use.

## 2014-05-23 NOTE — Assessment & Plan Note (Signed)
He will continue on Lyrica, and pain medicine for this.

## 2014-05-23 NOTE — Telephone Encounter (Signed)
gv adn printed appt sched and avs for pt fro June and JUly

## 2014-05-23 NOTE — Assessment & Plan Note (Signed)
I will proceed with treatment without dosage adjustment. I will recheck his serum light chain again with the next visit.

## 2014-05-23 NOTE — Patient Instructions (Signed)
[facility] Discharge Instructions for Patients Receiving Chemotherapy  Today you received the following chemotherapy agents:  Kyprolis  To help prevent nausea and vomiting after your treatment, we encourage you to take your nausea medication as ordered per MD.   If you develop nausea and vomiting that is not controlled by your nausea medication, call the clinic.   BELOW ARE SYMPTOMS THAT SHOULD BE REPORTED IMMEDIATELY:  *FEVER GREATER THAN 100.5 F  *CHILLS WITH OR WITHOUT FEVER  NAUSEA AND VOMITING THAT IS NOT CONTROLLED WITH YOUR NAUSEA MEDICATION  *UNUSUAL SHORTNESS OF BREATH  *UNUSUAL BRUISING OR BLEEDING  TENDERNESS IN MOUTH AND THROAT WITH OR WITHOUT PRESENCE OF ULCERS  *URINARY PROBLEMS  *BOWEL PROBLEMS  UNUSUAL RASH Items with * indicate a potential emergency and should be followed up as soon as possible.  Feel free to call the clinic you have any questions or concerns. The clinic phone number is [phone].

## 2014-05-23 NOTE — Assessment & Plan Note (Signed)
I recommend reducing the frequency of Lasix use to prevent risk of dehydration. I recommend we continue to followup closely.

## 2014-05-23 NOTE — Assessment & Plan Note (Signed)
I refilled his prescription pain medicines and cautioned him about risk of sedation and constipation. He will continue with calcium and vitamin D supplementation. Intravenous bisphosphonates were not prescribe due to poor dentition.

## 2014-05-23 NOTE — Assessment & Plan Note (Signed)
This is likely due to recent treatment. The patient denies recent history of bleeding such as epistaxis, hematuria or hematochezia. He is asymptomatic from the low platelet count. I will observe for now.  he does not require transfusion now. I will continue the chemotherapy at current dose without dosage adjustment.  If the thrombocytopenia gets progressive worse in the future, I might have to delay his treatment or adjust the chemotherapy dose.   

## 2014-05-24 ENCOUNTER — Ambulatory Visit (HOSPITAL_BASED_OUTPATIENT_CLINIC_OR_DEPARTMENT_OTHER): Payer: Medicare Other

## 2014-05-24 VITALS — BP 137/84 | HR 69 | Temp 97.9°F

## 2014-05-24 DIAGNOSIS — N189 Chronic kidney disease, unspecified: Secondary | ICD-10-CM

## 2014-05-24 DIAGNOSIS — IMO0002 Reserved for concepts with insufficient information to code with codable children: Secondary | ICD-10-CM

## 2014-05-24 DIAGNOSIS — B029 Zoster without complications: Secondary | ICD-10-CM

## 2014-05-24 DIAGNOSIS — Z5112 Encounter for antineoplastic immunotherapy: Secondary | ICD-10-CM

## 2014-05-24 DIAGNOSIS — C9002 Multiple myeloma in relapse: Secondary | ICD-10-CM

## 2014-05-24 MED ORDER — HEPARIN SOD (PORK) LOCK FLUSH 100 UNIT/ML IV SOLN
500.0000 [IU] | Freq: Once | INTRAVENOUS | Status: AC | PRN
  Administered 2014-05-24: 500 [IU]
  Filled 2014-05-24: qty 5

## 2014-05-24 MED ORDER — ONDANSETRON 8 MG/50ML IVPB (CHCC)
8.0000 mg | Freq: Once | INTRAVENOUS | Status: AC
  Administered 2014-05-24: 8 mg via INTRAVENOUS

## 2014-05-24 MED ORDER — SODIUM CHLORIDE 0.9 % IV SOLN
Freq: Once | INTRAVENOUS | Status: AC
  Administered 2014-05-24: 14:00:00 via INTRAVENOUS

## 2014-05-24 MED ORDER — ONDANSETRON 8 MG/NS 50 ML IVPB
INTRAVENOUS | Status: AC
  Filled 2014-05-24: qty 8

## 2014-05-24 MED ORDER — DEXAMETHASONE SODIUM PHOSPHATE 10 MG/ML IJ SOLN
INTRAMUSCULAR | Status: AC
  Filled 2014-05-24: qty 1

## 2014-05-24 MED ORDER — SODIUM CHLORIDE 0.9 % IJ SOLN
10.0000 mL | INTRAMUSCULAR | Status: DC | PRN
  Administered 2014-05-24: 10 mL
  Filled 2014-05-24: qty 10

## 2014-05-24 MED ORDER — DEXAMETHASONE SODIUM PHOSPHATE 10 MG/ML IJ SOLN
10.0000 mg | Freq: Once | INTRAMUSCULAR | Status: AC
  Administered 2014-05-24: 10 mg via INTRAVENOUS

## 2014-05-24 MED ORDER — DEXTROSE 5 % IV SOLN
27.0000 mg/m2 | Freq: Once | INTRAVENOUS | Status: AC
  Administered 2014-05-24: 60 mg via INTRAVENOUS
  Filled 2014-05-24: qty 30

## 2014-05-24 NOTE — Patient Instructions (Signed)
[facility] Discharge Instructions for Patients Receiving Chemotherapy  Today you received the following chemotherapy agents Kyprolis To help prevent nausea and vomiting after your treatment, we encourage you to take your nausea medication as prescribed.  If you develop nausea and vomiting that is not controlled by your nausea medication, call the clinic.   BELOW ARE SYMPTOMS THAT SHOULD BE REPORTED IMMEDIATELY:  *FEVER GREATER THAN 100.5 F  *CHILLS WITH OR WITHOUT FEVER  NAUSEA AND VOMITING THAT IS NOT CONTROLLED WITH YOUR NAUSEA MEDICATION  *UNUSUAL SHORTNESS OF BREATH  *UNUSUAL BRUISING OR BLEEDING  TENDERNESS IN MOUTH AND THROAT WITH OR WITHOUT PRESENCE OF ULCERS  *URINARY PROBLEMS  *BOWEL PROBLEMS  UNUSUAL RASH Items with * indicate a potential emergency and should be followed up as soon as possible.  Feel free to call the clinic you have any questions or concerns. The clinic phone number is [phone].    

## 2014-05-30 ENCOUNTER — Other Ambulatory Visit (HOSPITAL_BASED_OUTPATIENT_CLINIC_OR_DEPARTMENT_OTHER): Payer: Medicare Other

## 2014-05-30 ENCOUNTER — Other Ambulatory Visit: Payer: Self-pay | Admitting: Hematology and Oncology

## 2014-05-30 ENCOUNTER — Ambulatory Visit: Payer: Medicare Other

## 2014-05-30 ENCOUNTER — Ambulatory Visit (HOSPITAL_BASED_OUTPATIENT_CLINIC_OR_DEPARTMENT_OTHER): Payer: Medicare Other

## 2014-05-30 VITALS — BP 143/70 | HR 71 | Temp 98.4°F | Resp 18

## 2014-05-30 DIAGNOSIS — Z95828 Presence of other vascular implants and grafts: Secondary | ICD-10-CM

## 2014-05-30 DIAGNOSIS — C9002 Multiple myeloma in relapse: Secondary | ICD-10-CM

## 2014-05-30 DIAGNOSIS — IMO0002 Reserved for concepts with insufficient information to code with codable children: Secondary | ICD-10-CM

## 2014-05-30 DIAGNOSIS — N189 Chronic kidney disease, unspecified: Secondary | ICD-10-CM

## 2014-05-30 DIAGNOSIS — B029 Zoster without complications: Secondary | ICD-10-CM

## 2014-05-30 DIAGNOSIS — Z5112 Encounter for antineoplastic immunotherapy: Secondary | ICD-10-CM

## 2014-05-30 LAB — CBC WITH DIFFERENTIAL/PLATELET
BASO%: 0.3 % (ref 0.0–2.0)
Basophils Absolute: 0 10*3/uL (ref 0.0–0.1)
EOS ABS: 0.1 10*3/uL (ref 0.0–0.5)
EOS%: 1.7 % (ref 0.0–7.0)
HCT: 35.5 % — ABNORMAL LOW (ref 38.4–49.9)
HEMOGLOBIN: 11.8 g/dL — AB (ref 13.0–17.1)
LYMPH%: 11.6 % — ABNORMAL LOW (ref 14.0–49.0)
MCH: 34.2 pg — AB (ref 27.2–33.4)
MCHC: 33.3 g/dL (ref 32.0–36.0)
MCV: 102.7 fL — AB (ref 79.3–98.0)
MONO#: 0.5 10*3/uL (ref 0.1–0.9)
MONO%: 15.7 % — ABNORMAL HIGH (ref 0.0–14.0)
NEUT#: 2.2 10*3/uL (ref 1.5–6.5)
NEUT%: 70.7 % (ref 39.0–75.0)
Platelets: 66 10*3/uL — ABNORMAL LOW (ref 140–400)
RBC: 3.45 10*6/uL — ABNORMAL LOW (ref 4.20–5.82)
RDW: 13 % (ref 11.0–14.6)
WBC: 3.1 10*3/uL — ABNORMAL LOW (ref 4.0–10.3)
lymph#: 0.4 10*3/uL — ABNORMAL LOW (ref 0.9–3.3)

## 2014-05-30 LAB — COMPREHENSIVE METABOLIC PANEL (CC13)
ALT: <6 U/L (ref 0–55)
AST: 14 U/L (ref 5–34)
Albumin: 3.5 g/dL (ref 3.5–5.0)
Alkaline Phosphatase: 107 U/L (ref 40–150)
Anion Gap: 7 meq/L (ref 3–11)
BUN: 13.9 mg/dL (ref 7.0–26.0)
CO2: 29 meq/L (ref 22–29)
Calcium: 8.9 mg/dL (ref 8.4–10.4)
Chloride: 108 meq/L (ref 98–109)
Creatinine: 1.3 mg/dL (ref 0.7–1.3)
Glucose: 127 mg/dL (ref 70–140)
Potassium: 3.8 meq/L (ref 3.5–5.1)
Sodium: 144 meq/L (ref 136–145)
Total Bilirubin: 1.4 mg/dL — ABNORMAL HIGH (ref 0.20–1.20)
Total Protein: 5.4 g/dL — ABNORMAL LOW (ref 6.4–8.3)

## 2014-05-30 LAB — TECHNOLOGIST REVIEW

## 2014-05-30 MED ORDER — DEXTROSE 5 % IV SOLN
15.0000 mg/m2 | Freq: Once | INTRAVENOUS | Status: AC
  Administered 2014-05-30: 34 mg via INTRAVENOUS
  Filled 2014-05-30: qty 17

## 2014-05-30 MED ORDER — HEPARIN SOD (PORK) LOCK FLUSH 100 UNIT/ML IV SOLN
500.0000 [IU] | Freq: Once | INTRAVENOUS | Status: AC
  Administered 2014-05-30: 500 [IU] via INTRAVENOUS
  Filled 2014-05-30: qty 5

## 2014-05-30 MED ORDER — SODIUM CHLORIDE 0.9 % IV SOLN
Freq: Once | INTRAVENOUS | Status: AC
  Administered 2014-05-30: 15:00:00 via INTRAVENOUS

## 2014-05-30 MED ORDER — ONDANSETRON 8 MG/50ML IVPB (CHCC)
8.0000 mg | Freq: Once | INTRAVENOUS | Status: AC
  Administered 2014-05-30: 8 mg via INTRAVENOUS

## 2014-05-30 MED ORDER — DEXAMETHASONE SODIUM PHOSPHATE 10 MG/ML IJ SOLN
10.0000 mg | Freq: Once | INTRAMUSCULAR | Status: AC
  Administered 2014-05-30: 10 mg via INTRAVENOUS

## 2014-05-30 MED ORDER — HEPARIN SOD (PORK) LOCK FLUSH 100 UNIT/ML IV SOLN
500.0000 [IU] | Freq: Once | INTRAVENOUS | Status: AC | PRN
  Administered 2014-05-30: 500 [IU]
  Filled 2014-05-30: qty 5

## 2014-05-30 MED ORDER — ONDANSETRON 8 MG/NS 50 ML IVPB
INTRAVENOUS | Status: AC
  Filled 2014-05-30: qty 8

## 2014-05-30 MED ORDER — DEXAMETHASONE SODIUM PHOSPHATE 10 MG/ML IJ SOLN
INTRAMUSCULAR | Status: AC
  Filled 2014-05-30: qty 1

## 2014-05-30 MED ORDER — SODIUM CHLORIDE 0.9 % IJ SOLN
10.0000 mL | INTRAMUSCULAR | Status: DC | PRN
  Administered 2014-05-30: 10 mL
  Filled 2014-05-30: qty 10

## 2014-05-30 MED ORDER — SODIUM CHLORIDE 0.9 % IJ SOLN
10.0000 mL | INTRAMUSCULAR | Status: DC | PRN
  Administered 2014-05-30: 10 mL via INTRAVENOUS
  Filled 2014-05-30: qty 10

## 2014-05-30 NOTE — Patient Instructions (Signed)

## 2014-05-30 NOTE — Patient Instructions (Signed)
[facility] Discharge Instructions for Patients Receiving Chemotherapy  Today you received the following chemotherapy agents kyprolis  To help prevent nausea and vomiting after your treatment, we encourage you to take your nausea medication as directed   If you develop nausea and vomiting that is not controlled by your nausea medication, call the clinic.   BELOW ARE SYMPTOMS THAT SHOULD BE REPORTED IMMEDIATELY:  *FEVER GREATER THAN 100.5 F  *CHILLS WITH OR WITHOUT FEVER  NAUSEA AND VOMITING THAT IS NOT CONTROLLED WITH YOUR NAUSEA MEDICATION  *UNUSUAL SHORTNESS OF BREATH  *UNUSUAL BRUISING OR BLEEDING  TENDERNESS IN MOUTH AND THROAT WITH OR WITHOUT PRESENCE OF ULCERS  *URINARY PROBLEMS  *BOWEL PROBLEMS  UNUSUAL RASH Items with * indicate a potential emergency and should be followed up as soon as possible.  Feel free to call the clinic you have any questions or concerns. The clinic phone number is [phone].  

## 2014-05-30 NOTE — Progress Notes (Signed)
Ok to treat with platelets 66 per Dr. Alvy Bimler.

## 2014-05-31 ENCOUNTER — Ambulatory Visit (HOSPITAL_BASED_OUTPATIENT_CLINIC_OR_DEPARTMENT_OTHER): Payer: Medicare Other

## 2014-05-31 VITALS — BP 149/69 | HR 72 | Temp 97.9°F

## 2014-05-31 DIAGNOSIS — N189 Chronic kidney disease, unspecified: Secondary | ICD-10-CM

## 2014-05-31 DIAGNOSIS — B029 Zoster without complications: Secondary | ICD-10-CM

## 2014-05-31 DIAGNOSIS — Z5112 Encounter for antineoplastic immunotherapy: Secondary | ICD-10-CM

## 2014-05-31 DIAGNOSIS — C9002 Multiple myeloma in relapse: Secondary | ICD-10-CM

## 2014-05-31 DIAGNOSIS — IMO0002 Reserved for concepts with insufficient information to code with codable children: Secondary | ICD-10-CM

## 2014-05-31 MED ORDER — DEXAMETHASONE SODIUM PHOSPHATE 10 MG/ML IJ SOLN
10.0000 mg | Freq: Once | INTRAMUSCULAR | Status: AC
  Administered 2014-05-31: 10 mg via INTRAVENOUS

## 2014-05-31 MED ORDER — SODIUM CHLORIDE 0.9 % IJ SOLN
10.0000 mL | INTRAMUSCULAR | Status: DC | PRN
  Filled 2014-05-31: qty 10

## 2014-05-31 MED ORDER — DEXAMETHASONE SODIUM PHOSPHATE 10 MG/ML IJ SOLN
INTRAMUSCULAR | Status: AC
  Filled 2014-05-31: qty 1

## 2014-05-31 MED ORDER — ONDANSETRON 8 MG/50ML IVPB (CHCC)
8.0000 mg | Freq: Once | INTRAVENOUS | Status: AC
  Administered 2014-05-31: 8 mg via INTRAVENOUS

## 2014-05-31 MED ORDER — ONDANSETRON 8 MG/NS 50 ML IVPB
INTRAVENOUS | Status: AC
  Filled 2014-05-31: qty 8

## 2014-05-31 MED ORDER — SODIUM CHLORIDE 0.9 % IV SOLN
Freq: Once | INTRAVENOUS | Status: AC
  Administered 2014-05-31: 15:00:00 via INTRAVENOUS

## 2014-05-31 MED ORDER — DEXTROSE 5 % IV SOLN
15.0000 mg/m2 | Freq: Once | INTRAVENOUS | Status: AC
  Administered 2014-05-31: 34 mg via INTRAVENOUS
  Filled 2014-05-31: qty 17

## 2014-05-31 MED ORDER — HEPARIN SOD (PORK) LOCK FLUSH 100 UNIT/ML IV SOLN
500.0000 [IU] | Freq: Once | INTRAVENOUS | Status: DC | PRN
  Filled 2014-05-31: qty 5

## 2014-05-31 NOTE — Patient Instructions (Signed)
[facility] Discharge Instructions for Patients Receiving Chemotherapy  Today you received the following chemotherapy agents Kyprolis To help prevent nausea and vomiting after your treatment, we encourage you to take your nausea medication as prescribed.  If you develop nausea and vomiting that is not controlled by your nausea medication, call the clinic.   BELOW ARE SYMPTOMS THAT SHOULD BE REPORTED IMMEDIATELY:  *FEVER GREATER THAN 100.5 F  *CHILLS WITH OR WITHOUT FEVER  NAUSEA AND VOMITING THAT IS NOT CONTROLLED WITH YOUR NAUSEA MEDICATION  *UNUSUAL SHORTNESS OF BREATH  *UNUSUAL BRUISING OR BLEEDING  TENDERNESS IN MOUTH AND THROAT WITH OR WITHOUT PRESENCE OF ULCERS  *URINARY PROBLEMS  *BOWEL PROBLEMS  UNUSUAL RASH Items with * indicate a potential emergency and should be followed up as soon as possible.  Feel free to call the clinic you have any questions or concerns. The clinic phone number is [phone].    

## 2014-06-01 ENCOUNTER — Telehealth: Payer: Self-pay | Admitting: Cardiology

## 2014-06-01 LAB — KAPPA/LAMBDA LIGHT CHAINS
Kappa free light chain: 0.03 mg/dL — ABNORMAL LOW (ref 0.33–1.94)
Kappa:Lambda Ratio: 0 — ABNORMAL LOW (ref 0.26–1.65)
Lambda Free Lght Chn: 141 mg/dL — ABNORMAL HIGH (ref 0.57–2.63)

## 2014-06-01 LAB — SPEP & IFE WITH QIG
Albumin ELP: 69.3 % — ABNORMAL HIGH (ref 55.8–66.1)
Alpha-1-Globulin: 6.1 % — ABNORMAL HIGH (ref 2.9–4.9)
Alpha-2-Globulin: 9.4 % (ref 7.1–11.8)
BETA 2: 2.5 % — AB (ref 3.2–6.5)
BETA GLOBULIN: 7 % (ref 4.7–7.2)
GAMMA GLOBULIN: 5.7 % — AB (ref 11.1–18.8)
IGA: 6 mg/dL — AB (ref 68–379)
IgG (Immunoglobin G), Serum: 257 mg/dL — ABNORMAL LOW (ref 650–1600)
IgM, Serum: <5 mg/dL — ABNORMAL LOW (ref 41–251)
M-Spike, %: 0.1 g/dL
Total Protein, Serum Electrophoresis: 5.3 g/dL — ABNORMAL LOW (ref 6.0–8.3)

## 2014-06-01 LAB — BETA 2 MICROGLOBULIN, SERUM: Beta-2 Microglobulin: 4.38 mg/L — ABNORMAL HIGH (ref <=2.51)

## 2014-06-01 NOTE — Telephone Encounter (Signed)
I spoke with [name] [name] & wife about recent swelling of legs with chemotherapy. [name] [name] oncologist has given him a prescription for Furosemide 20mg . Wife states chemo nurse instructed him to take 1/2 tablet every other day as needed.  [name] [name] & wife state that he has urinated a lot since taking the diuretic yesterday. His legs & feet are soft this morning & seem to swell some as the day progresses- per wife Denies any shortness of breath Denies any chest pain--- except however, the [name] [name] states " I get a pain when I turn over on my left side but it doesn't last long & goes away". States it also goes away when he turns back over on his back. Reassured [name] [name].  [name] [name] weighed at chemo. He does not have a way to do this at home & keep a weight log  [name] [name] does state that he does have a burning sensation sometimes in his throat & feels like he has trouble swallowing at times.  States he does have the burning sensation for a couple of hours after he eats. Burning sensation relieved by taking Tums. [name] [name] is not taking Protonix as listed on ov note of 07/2013    Will keep follow-up appt with Dr. Marlou Porch later this month. Reassurance given Horton Chin RN

## 2014-06-01 NOTE — Telephone Encounter (Signed)
New problem   Pt's wife need to speak to nurse concerning pt having swollen legs and ankles. Please call pt.

## 2014-06-06 ENCOUNTER — Ambulatory Visit (HOSPITAL_BASED_OUTPATIENT_CLINIC_OR_DEPARTMENT_OTHER): Payer: Medicare Other

## 2014-06-06 ENCOUNTER — Other Ambulatory Visit: Payer: Self-pay | Admitting: Hematology and Oncology

## 2014-06-06 ENCOUNTER — Other Ambulatory Visit (HOSPITAL_BASED_OUTPATIENT_CLINIC_OR_DEPARTMENT_OTHER): Payer: Medicare Other

## 2014-06-06 ENCOUNTER — Ambulatory Visit: Payer: Medicare Other

## 2014-06-06 VITALS — BP 109/74 | HR 75 | Temp 98.8°F | Resp 12

## 2014-06-06 DIAGNOSIS — C9002 Multiple myeloma in relapse: Secondary | ICD-10-CM

## 2014-06-06 DIAGNOSIS — Z5112 Encounter for antineoplastic immunotherapy: Secondary | ICD-10-CM

## 2014-06-06 DIAGNOSIS — B029 Zoster without complications: Secondary | ICD-10-CM

## 2014-06-06 DIAGNOSIS — N189 Chronic kidney disease, unspecified: Secondary | ICD-10-CM

## 2014-06-06 DIAGNOSIS — Z95828 Presence of other vascular implants and grafts: Secondary | ICD-10-CM

## 2014-06-06 DIAGNOSIS — IMO0002 Reserved for concepts with insufficient information to code with codable children: Secondary | ICD-10-CM

## 2014-06-06 LAB — COMPREHENSIVE METABOLIC PANEL (CC13)
ALBUMIN: 3.7 g/dL (ref 3.5–5.0)
ALT: 7 U/L (ref 0–55)
AST: 16 U/L (ref 5–34)
Alkaline Phosphatase: 105 U/L (ref 40–150)
Anion Gap: 8 mEq/L (ref 3–11)
BILIRUBIN TOTAL: 1.13 mg/dL (ref 0.20–1.20)
BUN: 19 mg/dL (ref 7.0–26.0)
CO2: 26 mEq/L (ref 22–29)
Calcium: 8.8 mg/dL (ref 8.4–10.4)
Chloride: 108 mEq/L (ref 98–109)
Creatinine: 1.3 mg/dL (ref 0.7–1.3)
GLUCOSE: 177 mg/dL — AB (ref 70–140)
Potassium: 4.1 mEq/L (ref 3.5–5.1)
Sodium: 142 mEq/L (ref 136–145)
Total Protein: 5.7 g/dL — ABNORMAL LOW (ref 6.4–8.3)

## 2014-06-06 LAB — CBC WITH DIFFERENTIAL/PLATELET
BASO%: 0.4 % (ref 0.0–2.0)
BASOS ABS: 0 10*3/uL (ref 0.0–0.1)
EOS ABS: 0.1 10*3/uL (ref 0.0–0.5)
EOS%: 0.8 % (ref 0.0–7.0)
HCT: 37.4 % — ABNORMAL LOW (ref 38.4–49.9)
HEMOGLOBIN: 12.4 g/dL — AB (ref 13.0–17.1)
LYMPH%: 8.4 % — AB (ref 14.0–49.0)
MCH: 34.4 pg — AB (ref 27.2–33.4)
MCHC: 33.1 g/dL (ref 32.0–36.0)
MCV: 104.1 fL — AB (ref 79.3–98.0)
MONO#: 0.5 10*3/uL (ref 0.1–0.9)
MONO%: 8.9 % (ref 0.0–14.0)
NEUT#: 4.9 10*3/uL (ref 1.5–6.5)
NEUT%: 81.5 % — ABNORMAL HIGH (ref 39.0–75.0)
Platelets: 89 10*3/uL — ABNORMAL LOW (ref 140–400)
RBC: 3.59 10*6/uL — AB (ref 4.20–5.82)
RDW: 13.4 % (ref 11.0–14.6)
WBC: 6 10*3/uL (ref 4.0–10.3)
lymph#: 0.5 10*3/uL — ABNORMAL LOW (ref 0.9–3.3)

## 2014-06-06 MED ORDER — DEXAMETHASONE SODIUM PHOSPHATE 10 MG/ML IJ SOLN
10.0000 mg | Freq: Once | INTRAMUSCULAR | Status: AC
  Administered 2014-06-06: 10 mg via INTRAVENOUS

## 2014-06-06 MED ORDER — DEXAMETHASONE SODIUM PHOSPHATE 10 MG/ML IJ SOLN
INTRAMUSCULAR | Status: AC
  Filled 2014-06-06: qty 1

## 2014-06-06 MED ORDER — SODIUM CHLORIDE 0.9 % IJ SOLN
10.0000 mL | INTRAMUSCULAR | Status: DC | PRN
  Administered 2014-06-06: 10 mL via INTRAVENOUS
  Filled 2014-06-06: qty 10

## 2014-06-06 MED ORDER — DEXTROSE 5 % IV SOLN
15.0000 mg/m2 | Freq: Once | INTRAVENOUS | Status: AC
  Administered 2014-06-06: 34 mg via INTRAVENOUS
  Filled 2014-06-06: qty 17

## 2014-06-06 MED ORDER — SODIUM CHLORIDE 0.9 % IJ SOLN
10.0000 mL | INTRAMUSCULAR | Status: DC | PRN
  Administered 2014-06-06: 10 mL
  Filled 2014-06-06: qty 10

## 2014-06-06 MED ORDER — HEPARIN SOD (PORK) LOCK FLUSH 100 UNIT/ML IV SOLN
500.0000 [IU] | Freq: Once | INTRAVENOUS | Status: AC
Start: 2014-06-06 — End: 2014-06-06
  Administered 2014-06-06: 500 [IU] via INTRAVENOUS
  Filled 2014-06-06: qty 5

## 2014-06-06 MED ORDER — SODIUM CHLORIDE 0.9 % IV SOLN
Freq: Once | INTRAVENOUS | Status: AC
  Administered 2014-06-06: 14:00:00 via INTRAVENOUS

## 2014-06-06 MED ORDER — ONDANSETRON 8 MG/50ML IVPB (CHCC)
8.0000 mg | Freq: Once | INTRAVENOUS | Status: AC
  Administered 2014-06-06: 8 mg via INTRAVENOUS

## 2014-06-06 MED ORDER — HEPARIN SOD (PORK) LOCK FLUSH 100 UNIT/ML IV SOLN
500.0000 [IU] | Freq: Once | INTRAVENOUS | Status: AC | PRN
  Administered 2014-06-06: 500 [IU]
  Filled 2014-06-06: qty 5

## 2014-06-06 MED ORDER — ONDANSETRON 8 MG/NS 50 ML IVPB
INTRAVENOUS | Status: AC
  Filled 2014-06-06: qty 8

## 2014-06-06 NOTE — Patient Instructions (Signed)

## 2014-06-06 NOTE — Patient Instructions (Signed)
[facility] Discharge Instructions for Patients Receiving Chemotherapy  Today you received the following chemotherapy agents kyprolis  To help prevent nausea and vomiting after your treatment, we encourage you to take your nausea medication if needed, start after 9 pm tonight if needed.   If you develop nausea and vomiting that is not controlled by your nausea medication, call the clinic.   BELOW ARE SYMPTOMS THAT SHOULD BE REPORTED IMMEDIATELY:  *FEVER GREATER THAN 100.5 F  *CHILLS WITH OR WITHOUT FEVER  NAUSEA AND VOMITING THAT IS NOT CONTROLLED WITH YOUR NAUSEA MEDICATION  *UNUSUAL SHORTNESS OF BREATH  *UNUSUAL BRUISING OR BLEEDING  TENDERNESS IN MOUTH AND THROAT WITH OR WITHOUT PRESENCE OF ULCERS  *URINARY PROBLEMS  *BOWEL PROBLEMS  UNUSUAL RASH Items with * indicate a potential emergency and should be followed up as soon as possible.  Feel free to call the clinic you have any questions or concerns. The clinic phone number is [phone].

## 2014-06-07 ENCOUNTER — Ambulatory Visit (HOSPITAL_BASED_OUTPATIENT_CLINIC_OR_DEPARTMENT_OTHER): Payer: Medicare Other

## 2014-06-07 VITALS — BP 148/81 | HR 70 | Resp 16

## 2014-06-07 DIAGNOSIS — B029 Zoster without complications: Secondary | ICD-10-CM

## 2014-06-07 DIAGNOSIS — N189 Chronic kidney disease, unspecified: Secondary | ICD-10-CM

## 2014-06-07 DIAGNOSIS — IMO0002 Reserved for concepts with insufficient information to code with codable children: Secondary | ICD-10-CM

## 2014-06-07 DIAGNOSIS — C9002 Multiple myeloma in relapse: Secondary | ICD-10-CM

## 2014-06-07 DIAGNOSIS — Z5112 Encounter for antineoplastic immunotherapy: Secondary | ICD-10-CM

## 2014-06-07 MED ORDER — DEXTROSE 5 % IV SOLN
15.0000 mg/m2 | Freq: Once | INTRAVENOUS | Status: AC
  Administered 2014-06-07: 34 mg via INTRAVENOUS
  Filled 2014-06-07: qty 17

## 2014-06-07 MED ORDER — DEXAMETHASONE SODIUM PHOSPHATE 10 MG/ML IJ SOLN
10.0000 mg | Freq: Once | INTRAMUSCULAR | Status: AC
  Administered 2014-06-07: 10 mg via INTRAVENOUS

## 2014-06-07 MED ORDER — SODIUM CHLORIDE 0.9 % IJ SOLN
10.0000 mL | INTRAMUSCULAR | Status: DC | PRN
  Administered 2014-06-07: 10 mL
  Filled 2014-06-07: qty 10

## 2014-06-07 MED ORDER — SODIUM CHLORIDE 0.9 % IV SOLN
Freq: Once | INTRAVENOUS | Status: AC
  Administered 2014-06-07: 14:00:00 via INTRAVENOUS

## 2014-06-07 MED ORDER — HEPARIN SOD (PORK) LOCK FLUSH 100 UNIT/ML IV SOLN
500.0000 [IU] | Freq: Once | INTRAVENOUS | Status: AC | PRN
  Administered 2014-06-07: 500 [IU]
  Filled 2014-06-07: qty 5

## 2014-06-07 MED ORDER — ONDANSETRON 8 MG/50ML IVPB (CHCC)
8.0000 mg | Freq: Once | INTRAVENOUS | Status: AC
  Administered 2014-06-07: 8 mg via INTRAVENOUS

## 2014-06-07 MED ORDER — SODIUM CHLORIDE 0.9 % IV SOLN
Freq: Once | INTRAVENOUS | Status: AC
  Administered 2014-06-07: 15:00:00 via INTRAVENOUS

## 2014-06-07 MED ORDER — ONDANSETRON 8 MG/NS 50 ML IVPB
INTRAVENOUS | Status: AC
  Filled 2014-06-07: qty 8

## 2014-06-07 MED ORDER — FUROSEMIDE 10 MG/ML IJ SOLN
20.0000 mg | Freq: Once | INTRAMUSCULAR | Status: AC
  Administered 2014-06-07: 20 mg via INTRAVENOUS

## 2014-06-07 MED ORDER — DEXAMETHASONE SODIUM PHOSPHATE 10 MG/ML IJ SOLN
INTRAMUSCULAR | Status: AC
  Filled 2014-06-07: qty 1

## 2014-06-07 NOTE — Patient Instructions (Signed)
[facility] Discharge Instructions for Patients Receiving Chemotherapy  Today you received the following chemotherapy agent: Kyprolis  You also received 1 dose of lasix (20 mg) in your IV today.  To help prevent nausea and vomiting after your treatment, we encourage you to take your nausea medication as prescribed.    If you develop nausea and vomiting that is not controlled by your nausea medication, call the clinic.   BELOW ARE SYMPTOMS THAT SHOULD BE REPORTED IMMEDIATELY:  *FEVER GREATER THAN 100.5 F  *CHILLS WITH OR WITHOUT FEVER  NAUSEA AND VOMITING THAT IS NOT CONTROLLED WITH YOUR NAUSEA MEDICATION  *UNUSUAL SHORTNESS OF BREATH  *UNUSUAL BRUISING OR BLEEDING  TENDERNESS IN MOUTH AND THROAT WITH OR WITHOUT PRESENCE OF ULCERS  *URINARY PROBLEMS  *BOWEL PROBLEMS  UNUSUAL RASH Items with * indicate a potential emergency and should be followed up as soon as possible.  Feel free to call the clinic you have any questions or concerns. The clinic phone number is [phone].

## 2014-06-07 NOTE — Progress Notes (Signed)
Dr. Alvy Bimler notified that patient reports increased lower leg swelling now requiring him to cut his socks because they are too tight. Pt did not take "fluid pill" today, stating he takes it every other day. Per MD, give NS bolus per protocol and administer 20 mg IV lasix once. This RN to follow up.  1435: Dr. Alvy Bimler at chairside at this time for patient visit.

## 2014-06-07 NOTE — Telephone Encounter (Signed)
Agree. SKAINS, MARK, MD  

## 2014-06-19 ENCOUNTER — Encounter: Payer: Self-pay | Admitting: *Deleted

## 2014-06-19 ENCOUNTER — Ambulatory Visit (INDEPENDENT_AMBULATORY_CARE_PROVIDER_SITE_OTHER): Payer: Medicare Other | Admitting: Cardiology

## 2014-06-19 ENCOUNTER — Encounter: Payer: Self-pay | Admitting: Cardiology

## 2014-06-19 VITALS — BP 128/70 | HR 86 | Ht 71.0 in | Wt 236.0 lb

## 2014-06-19 DIAGNOSIS — R600 Localized edema: Secondary | ICD-10-CM

## 2014-06-19 DIAGNOSIS — R609 Edema, unspecified: Secondary | ICD-10-CM

## 2014-06-19 DIAGNOSIS — R0789 Other chest pain: Secondary | ICD-10-CM

## 2014-06-19 DIAGNOSIS — I77810 Thoracic aortic ectasia: Secondary | ICD-10-CM | POA: Insufficient documentation

## 2014-06-19 MED ORDER — FUROSEMIDE 40 MG PO TABS: 40.0000 mg | ORAL_TABLET | Freq: Every day | ORAL | Status: DC

## 2014-06-19 NOTE — Patient Instructions (Addendum)
Please increase your Furosemide to 40 mg a day. Continue all other medications as listed.  Your physician has requested that you have an echocardiogram. Echocardiography is a painless test that uses sound waves to create images of your heart. It provides your doctor with information about the size and shape of your heart and how well your heart's chambers and valves are working. This procedure takes approximately one hour. There are no restrictions for this procedure.  Follow up with Dr Marlou Porch as scheduled.

## 2014-06-19 NOTE — Progress Notes (Signed)
[facility] [address], [address] [place], [place]  [address] Phone: [phone] Fax:  [phone]  Date:  06/19/2014   ID:  [name] [name], DOB [birth year], MRN [id number]  PCP:  Kandice Hams, MD   History of Present Illness: [name] [name] is a 73 y.o. male with nuclear stress test after ejection fraction was discovered to be 47% at [facility] with multiple myeloma, diabetes, hypertension, hyperlipidemia here for followup. History stress test did show inferior wall hypokinesis with ejection fraction of 47%-mildly reduced. His exercise time was also mildly reduced at 4 minutes and 59 seconds. He showed no signs of any significant ischemia. Overall low risk. He is currently on ARB Diovan at 80 mg low-dose. He does have diabetes. Stem cell transplant performed at [facility].   On chemo twice a week. One of the side effects can be swollen ankles. Also when lay on left side heart would hurt. This has gone away. His weight has increased. Fluid seems to have improved mildly with Lasix by oncology.   Wt Readings from Last 3 Encounters:  06/19/14 236 lb (107.049 kg)  05/23/14 234 lb 14.4 oz (106.55 kg)  04/25/14 232 lb 1.6 oz (105.28 kg)     Past Medical History  Diagnosis Date  . Diabetes mellitus   . GERD (gastroesophageal reflux disease)   . Anemia   . Hypertension   . Prostatic hyperplasia   . Arthritis   . Multiple myeloma in relapse 12/24/2011  . Benign essential HTN 12/24/2011  . CRI (chronic renal insufficiency) 12/26/2011  . 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 DVT of lower extremity (deep venous thrombosis) 05/14/2011  . Hyperlipidemia, mixed 12/26/2011  . Herpes zoster infection 03/11/2010  . DM II (diabetes mellitus, type II), controlled 12/26/2011  . Pharyngitis, acute 03/21/2013  . 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 Diabetic neuropathy, type II diabetes mellitus 05/31/2013  . Urethritis 10/04/2013    Past Surgical History    Procedure Laterality Date  . Foot surgery Left     Current Outpatient Prescriptions  Medication Sig Dispense Refill  . acetaminophen (TYLENOL) 500 MG tablet Take 500 mg by mouth every 6 (six) hours as needed. For pain.      Marland Kitchen aspirin EC 81 MG tablet Take 81 mg by mouth daily after breakfast.       . atorvastatin (LIPITOR) 10 MG tablet Take 10 mg by mouth at bedtime.        . BD ULTRA-FINE PEN NEEDLES 29G X 12.7MM MISC Inject 30 Units into the skin daily.       . carbidopa-levodopa (SINEMET IR) 25-100 MG per tablet TAKE 1 TABLET 3 TIMES A DAY  90 tablet  6  . furosemide (LASIX) 20 MG tablet Take 1 tablet (20 mg total) by mouth daily.  30 tablet  3  . Insulin Glargine (LANTUS SOLOSTAR [place]) Inject 20-30 Units into the skin every evening.       . lidocaine-prilocaine (EMLA) cream Apply 1 application topically as needed. Apply to port at least 1 hour before procedure & cover.  30 g  1  . morphine (MS CONTIN) 30 MG 12 hr tablet Take 1 tablet (30 mg total) by mouth every 12 (twelve) hours.  60 tablet  0  . morphine (MSIR) 15 MG tablet Take 1 tablet (15 mg total) by mouth every 4 (four) hours as needed for severe pain.  90 tablet  0  . polyethylene  glycol (MIRALAX / GLYCOLAX) packet Take 17 g by mouth daily after breakfast.       . pregabalin (LYRICA) 100 MG capsule Take 1 capsule (100 mg total) by mouth 2 (two) times daily.  60 capsule  1  . rasagiline (AZILECT) 1 MG TABS tablet Take 1 tablet (1 mg total) by mouth daily.  30 tablet  6  . valACYclovir (VALTREX) 500 MG tablet Take 1 tablet (500 mg total) by mouth daily. Takes once daily.  90 tablet  3   No current facility-administered medications for this visit.   Facility-Administered Medications Ordered in Other Visits  Medication Dose Route Frequency Provider Last Rate Last Dose  . sodium chloride 0.9 % injection 10 mL  10 mL Intravenous PRN Heath Lark, MD   10 mL at 05/09/14 1045    Allergies:   No Known Allergies  Social History:  The  patient  reports that he quit smoking about 6 years ago. He has never used smokeless tobacco. He reports that he does not drink alcohol or use illicit drugs.   Family History  Problem Relation Age of Onset  . CAD Neg Hx     ROS:  Please see the history of present illness.   Denies any syncope, bleeding, orthopnea, PND.   All other systems reviewed and negative.   PHYSICAL EXAM: VS:  BP 128/70  Pulse 86  Ht 5' 11"  (1.803 m)  Wt 236 lb (107.049 kg)  BMI 32.93 kg/m2 Well nourished, well developed, in no acute distress HEENT: normal, [place]/AT, EOMI Neck: no JVD, normal carotid upstroke, no bruit Cardiac:  normal S1, S2; RRR; no murmur Lungs:  clear to auscultation bilaterally, no wheezing, rhonchi or rales Abd: soft, nontender, no hepatomegaly, no bruits Ext: 2+ BLE edema, 2+ distal pulses Skin: warm and dry GU: deferred Neuro: no focal abnormalities noted, AAO x 3  EKG:  None today  ASSESSMENT AND PLAN:  1. Lower extremity edema-this may be a potential side effect of his current chemotherapeutic regimen however I do want to make sure that his react function has not deteriorated. This may be a degree of venous insufficiency as well. I will recheck an echocardiogram. Previously he had mildly reduced ejection fraction of 47%. I will also increase his Lasix to 40 mg once a day. Oncology has been checking his renal function very diligently. I would be willing to tolerate a mild increase in creatinine from 1.3. If his renal function were to deteriorate significantly, Lasix would need to be decreased. I also encouraged compression hose which his wife states have been trouble to get on in the past. Elevation of legs. Decrease salt, overall fluid intake. 2. Atypical chest pain-when laying on his left side, he had chest discomfort which was transient. He is improved. I will check an echocardiogram to ensure proper structure and function of the heart. 3. Dilated aortic root-4 cm previously. Checking  echocardiogram. 4. I will have him see APP in one month.  Signed, Candee Furbish, MD [facility]  06/19/2014 8:50 AM

## 2014-06-20 ENCOUNTER — Other Ambulatory Visit: Payer: Self-pay | Admitting: *Deleted

## 2014-06-20 ENCOUNTER — Ambulatory Visit (HOSPITAL_BASED_OUTPATIENT_CLINIC_OR_DEPARTMENT_OTHER): Payer: Medicare Other | Admitting: Hematology and Oncology

## 2014-06-20 ENCOUNTER — Ambulatory Visit: Payer: Medicare Other

## 2014-06-20 ENCOUNTER — Other Ambulatory Visit (HOSPITAL_BASED_OUTPATIENT_CLINIC_OR_DEPARTMENT_OTHER): Payer: Medicare Other

## 2014-06-20 ENCOUNTER — Encounter: Payer: Self-pay | Admitting: *Deleted

## 2014-06-20 ENCOUNTER — Encounter: Payer: Self-pay | Admitting: Hematology and Oncology

## 2014-06-20 ENCOUNTER — Other Ambulatory Visit (HOSPITAL_COMMUNITY): Payer: Medicare Other

## 2014-06-20 VITALS — BP 136/68 | HR 72 | Temp 97.8°F | Resp 18 | Ht 71.0 in | Wt 234.8 lb

## 2014-06-20 DIAGNOSIS — E1149 Type 2 diabetes mellitus with other diabetic neurological complication: Secondary | ICD-10-CM

## 2014-06-20 DIAGNOSIS — D6959 Other secondary thrombocytopenia: Secondary | ICD-10-CM

## 2014-06-20 DIAGNOSIS — N189 Chronic kidney disease, unspecified: Secondary | ICD-10-CM

## 2014-06-20 DIAGNOSIS — T50905A Adverse effect of unspecified drugs, medicaments and biological substances, initial encounter: Secondary | ICD-10-CM

## 2014-06-20 DIAGNOSIS — C9002 Multiple myeloma in relapse: Secondary | ICD-10-CM

## 2014-06-20 DIAGNOSIS — R600 Localized edema: Secondary | ICD-10-CM

## 2014-06-20 DIAGNOSIS — K5900 Constipation, unspecified: Secondary | ICD-10-CM

## 2014-06-20 DIAGNOSIS — E1142 Type 2 diabetes mellitus with diabetic polyneuropathy: Secondary | ICD-10-CM

## 2014-06-20 DIAGNOSIS — E1140 Type 2 diabetes mellitus with diabetic neuropathy, unspecified: Secondary | ICD-10-CM

## 2014-06-20 DIAGNOSIS — R609 Edema, unspecified: Secondary | ICD-10-CM

## 2014-06-20 DIAGNOSIS — D630 Anemia in neoplastic disease: Secondary | ICD-10-CM

## 2014-06-20 DIAGNOSIS — Z95828 Presence of other vascular implants and grafts: Secondary | ICD-10-CM

## 2014-06-20 DIAGNOSIS — N182 Chronic kidney disease, stage 2 (mild): Secondary | ICD-10-CM

## 2014-06-20 LAB — COMPREHENSIVE METABOLIC PANEL (CC13)
ALK PHOS: 119 U/L (ref 40–150)
ALT: 7 U/L (ref 0–55)
AST: 18 U/L (ref 5–34)
Albumin: 3.5 g/dL (ref 3.5–5.0)
Anion Gap: 8 mEq/L (ref 3–11)
BILIRUBIN TOTAL: 0.94 mg/dL (ref 0.20–1.20)
BUN: 9.3 mg/dL (ref 7.0–26.0)
CO2: 26 mEq/L (ref 22–29)
CREATININE: 1.3 mg/dL (ref 0.7–1.3)
Calcium: 8.5 mg/dL (ref 8.4–10.4)
Chloride: 108 mEq/L (ref 98–109)
Glucose: 228 mg/dl — ABNORMAL HIGH (ref 70–140)
Potassium: 3.8 mEq/L (ref 3.5–5.1)
Sodium: 142 mEq/L (ref 136–145)
Total Protein: 5.5 g/dL — ABNORMAL LOW (ref 6.4–8.3)

## 2014-06-20 LAB — CBC WITH DIFFERENTIAL/PLATELET
BASO%: 0.7 % (ref 0.0–2.0)
Basophils Absolute: 0 10*3/uL (ref 0.0–0.1)
EOS%: 1.9 % (ref 0.0–7.0)
Eosinophils Absolute: 0.1 10*3/uL (ref 0.0–0.5)
HEMATOCRIT: 36.2 % — AB (ref 38.4–49.9)
HGB: 12.1 g/dL — ABNORMAL LOW (ref 13.0–17.1)
LYMPH%: 17 % (ref 14.0–49.0)
MCH: 34.3 pg — ABNORMAL HIGH (ref 27.2–33.4)
MCHC: 33.5 g/dL (ref 32.0–36.0)
MCV: 102.6 fL — ABNORMAL HIGH (ref 79.3–98.0)
MONO#: 0.5 10*3/uL (ref 0.1–0.9)
MONO%: 15 % — AB (ref 0.0–14.0)
NEUT#: 2.3 10*3/uL (ref 1.5–6.5)
NEUT%: 65.4 % (ref 39.0–75.0)
PLATELETS: 86 10*3/uL — AB (ref 140–400)
RBC: 3.53 10*6/uL — ABNORMAL LOW (ref 4.20–5.82)
RDW: 13.6 % (ref 11.0–14.6)
WBC: 3.5 10*3/uL — ABNORMAL LOW (ref 4.0–10.3)
lymph#: 0.6 10*3/uL — ABNORMAL LOW (ref 0.9–3.3)

## 2014-06-20 MED ORDER — HEPARIN SOD (PORK) LOCK FLUSH 100 UNIT/ML IV SOLN
500.0000 [IU] | Freq: Once | INTRAVENOUS | Status: AC
  Administered 2014-06-20: 500 [IU] via INTRAVENOUS
  Filled 2014-06-20: qty 5

## 2014-06-20 MED ORDER — SODIUM CHLORIDE 0.9 % IJ SOLN
10.0000 mL | INTRAMUSCULAR | Status: DC | PRN
  Administered 2014-06-20: 10 mL via INTRAVENOUS
  Filled 2014-06-20: qty 10

## 2014-06-20 NOTE — Assessment & Plan Note (Signed)
He is currently on no diuretics. His weight remained stable.

## 2014-06-20 NOTE — Assessment & Plan Note (Signed)
The cause is likely due to recent treatment and disease.. It is mild and there is little change compared from previous platelet count. The patient denies recent history of bleeding such as epistaxis, hematuria or hematochezia. He is asymptomatic from the thrombocytopenia. I will observe for now.  he does not require transfusion now.

## 2014-06-20 NOTE — Patient Instructions (Signed)

## 2014-06-20 NOTE — Assessment & Plan Note (Signed)
This is likely anemia of chronic disease. The patient denies recent history of bleeding such as epistaxis, hematuria or hematochezia. He is asymptomatic from the anemia. We will observe for now.  He does not require transfusion now.   

## 2014-06-20 NOTE — Assessment & Plan Note (Signed)
Continue to monitor closely. He continues taking pain medicine.

## 2014-06-20 NOTE — Assessment & Plan Note (Signed)
Unfortunately, his light chain continued to get worse. I recommend discontinuation of current treatment. I discussed with him potential enrollment in clinical followup. He is interested. I would get our research coordinator to talk to him. An alternative would be refer him back to [facility] or to switch him back to Velcade along with Cytoxan The patient is undecided. I will follow with him next week.

## 2014-06-20 NOTE — Progress Notes (Signed)
[name] [name] OFFICE PROGRESS NOTE  Patient Care Team: Kandice Hams, MD as PCP - General (Internal Medicine) Annia Belt, MD as Consulting Physician (Internal Medicine) Heath Lark, MD as Consulting Physician (Hematology and Oncology)  SUMMARY OF ONCOLOGIC HISTORY:  He was initially diagnosed in June 2010. He had a heavy myeloma burden at time of diagnosis with multiple lytic bone lesions, 87% plasma cells in the bone marrow, and 6.7 g of protein in the urine. 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 He went on to receive high-dose IV melphalan with autologous stem cell support at [facility] 03/12/2010. We began to see small amounts of monoclonal lambda free light chains again in his urine in March of 2012. He was started on Revlimid 10 mg daily beginning in April 2012. Dose had to be adjusted due to myelosuppression down to 5 mg. At time of a visit here in December 2013, he was complaining of recurrent pain in his shoulders, bilateral ribs, and low back. Lab showed rise in his serum and urine paraprotein levels. He was restaged with a bone marrow biopsy on 12/31/2012 which showed 10% plasma cells, rise in total urine protein from 260 mg to 1039 mg, and rise in serum free lambda light chains from 4.8 mg percent in July 2013,to 10 mg percent on October 29, to a 25.8 mg percent by December 16, to 58.5 mg percent by 01/07/2013. He was then started him on pomalidomide on 01/28/2013. Initial dose 3 mg 21 days on 7 days rest.  He tolerated the drug well. Unfortunately, at time of his visit here on August 2014 he appeared to be breaking through the pomalidomide. Although his hematologic profile remained stable, there was a progressive rise in the lambda free light chains up to 116 mg percent as of 07/26/2013 compared with 58 on May 28. 24 hour urine total protein back up to 6645 mg. After a lengthy discussion  with respect to treatment options, he was started on trial of Bendamustine plus prednisone. This was started on September 4. He had 6 cycles through  02/01/2014. He appeared to have a initial improvement with fall in his serum lambda free light chains from 116 mg percent down to 91 by November 2014, fall in 24 hour urine protein from 6.6 g down to 5.4, fall in his creatinine from 1.4 down to 1.1. Unfortunately, recent reevaluation done on February 01, 2014 shows rise in the serum light chains up to 261 mg percent, urine 24-hour protein up to 14.4 g, and creatinine up to 1.3.  On 02/28/2014, he was started on a trial of kyprolis. Repeat serum lambda light chain on 04/10/2014 showed that the patient is responding to treatment. On 06/20/2014, repeat serum light chain is getting worse and treatment was discontinued.  INTERVAL HISTORY: Please see below for problem oriented charting. He continued to have persistent leg edema. Recently, he was started back on diuretic. His appetite is fair. He has low energy level. History of persistent peripheral neuropathy with pain. His current pain medicine is adequate to control his symptoms. He has some constipation, relieved with laxatives. The patient denies any recent signs or symptoms of bleeding such as spontaneous epistaxis, hematuria or hematochezia.  REVIEW OF SYSTEMS:   Constitutional: Denies fevers, chills or abnormal weight loss Eyes: Denies blurriness of vision Ears, nose, mouth, throat, and face: Denies mucositis or sore throat Respiratory: Denies cough, dyspnea  or wheezes Cardiovascular: Denies palpitation, chest discomfort  Skin: Denies abnormal skin rashes Lymphatics: Denies new lymphadenopathy Neurological:Denies numbness, tingling or new weaknesses Behavioral/Psych: Mood is stable, no new changes  All other systems were reviewed with the patient and are negative.  I have reviewed the past medical history, past surgical history, social history  and family history with the patient and they are unchanged from previous note.  ALLERGIES:  has No Known Allergies.  MEDICATIONS:  Current Outpatient Prescriptions  Medication Sig Dispense Refill  . acetaminophen (TYLENOL) 500 MG tablet Take 500 mg by mouth every 6 (six) hours as needed. For pain.      Marland Kitchen aspirin EC 81 MG tablet Take 81 mg by mouth daily after breakfast.       . atorvastatin (LIPITOR) 10 MG tablet Take 10 mg by mouth at bedtime.        . BD ULTRA-FINE PEN NEEDLES 29G X 12.7MM MISC Inject 30 Units into the skin daily.       . carbidopa-levodopa (SINEMET IR) 25-100 MG per tablet TAKE 1 TABLET 3 TIMES A DAY  90 tablet  6  . furosemide (LASIX) 40 MG tablet Take 1 tablet (40 mg total) by mouth daily.  30 tablet  11  . Insulin Glargine (LANTUS SOLOSTAR [place]) Inject 20-30 Units into the skin every evening.       . lidocaine-prilocaine (EMLA) cream Apply 1 application topically as needed. Apply to port at least 1 hour before procedure & cover.  30 g  1  . morphine (MS CONTIN) 30 MG 12 hr tablet Take 1 tablet (30 mg total) by mouth every 12 (twelve) hours.  60 tablet  0  . morphine (MSIR) 15 MG tablet Take 1 tablet (15 mg total) by mouth every 4 (four) hours as needed for severe pain.  90 tablet  0  . polyethylene glycol (MIRALAX / GLYCOLAX) packet Take 17 g by mouth daily after breakfast.       . pregabalin (LYRICA) 100 MG capsule Take 1 capsule (100 mg total) by mouth 2 (two) times daily.  60 capsule  1  . rasagiline (AZILECT) 1 MG TABS tablet Take 1 tablet (1 mg total) by mouth daily.  30 tablet  6  . valACYclovir (VALTREX) 500 MG tablet Take 1 tablet (500 mg total) by mouth daily. Takes once daily.  90 tablet  3   Current Facility-Administered Medications  Medication Dose Route Frequency Shalandra Leu Last Rate Last Dose  . sodium chloride 0.9 % injection 10 mL  10 mL Intravenous PRN Heath Lark, MD   10 mL at 06/20/14 1246   Facility-Administered Medications Ordered in Other Visits   Medication Dose Route Frequency Malloree Raboin Last Rate Last Dose  . sodium chloride 0.9 % injection 10 mL  10 mL Intravenous PRN Heath Lark, MD   10 mL at 05/09/14 1045    PHYSICAL EXAMINATION: ECOG PERFORMANCE STATUS: 2 - Symptomatic, <50% confined to bed  Filed Vitals:   06/20/14 1211  BP: 136/68  Pulse: 72  Temp: 97.8 F (36.6 C)  Resp: 18   Filed Weights   06/20/14 1211  Weight: 234 lb 12.8 oz (106.505 kg)    GENERAL:alert, no distress and comfortable SKIN: skin color, texture, turgor are normal, no rashes or significant lesions EYES: normal, Conjunctiva are pale and non-injected, sclera clear OROPHARYNX:no exudate, no erythema and lips, buccal mucosa, and tongue normal  NECK: supple, thyroid normal size, non-tender, without nodularity LYMPH:  no palpable lymphadenopathy in the  cervical, axillary or inguinal LUNGS: clear to auscultation and percussion with normal breathing effort HEART: regular rate & rhythm and no murmurs with moderate bilateral lower extremity edema ABDOMEN:abdomen soft, non-tender and normal bowel sounds Musculoskeletal:no cyanosis of digits and no clubbing  NEURO: alert & oriented x 3 with fluent speech, no focal motor/sensory deficits  LABORATORY DATA:  I have reviewed the data as listed    Component Value Date/Time   NA 142 06/20/2014 1151   NA 138 12/16/2013 1350   NA 145 06/13/2009 1503   K 3.8 06/20/2014 1151   K 3.6 12/16/2013 1350   K 4.8* 06/13/2009 1503   CL 104 12/16/2013 1350   CL 104 05/18/2013 1113   CL 108 06/13/2009 1503   CO2 26 06/20/2014 1151   CO2 24 12/16/2013 1350   CO2 29 06/13/2009 1503   GLUCOSE 228* 06/20/2014 1151   GLUCOSE 192* 12/16/2013 1350   GLUCOSE 325* 05/18/2013 1113   GLUCOSE 114 06/13/2009 1503   BUN 9.3 06/20/2014 1151   BUN 10 12/16/2013 1350   BUN 49* 06/13/2009 1503   CREATININE 1.3 06/20/2014 1151   CREATININE 1.28 12/16/2013 1350   CREATININE 1.33 07/22/2012 1334   CREATININE 2.9* 06/13/2009 1503   CALCIUM 8.5  06/20/2014 1151   CALCIUM 7.7* 12/16/2013 1350   CALCIUM 10.5* 06/13/2009 1503   PROT 5.5* 06/20/2014 1151   PROT 5.1* 12/16/2013 1350   PROT 6.4 06/13/2009 1503   ALBUMIN 3.5 06/20/2014 1151   ALBUMIN 2.9* 12/16/2013 1350   AST 18 06/20/2014 1151   AST 26 12/16/2013 1350   AST 21 06/13/2009 1503   ALT 7 06/20/2014 1151   ALT <5 12/16/2013 1350   ALT 25 06/13/2009 1503   ALKPHOS 119 06/20/2014 1151   ALKPHOS 109 12/16/2013 1350   ALKPHOS 135* 06/13/2009 1503   BILITOT 0.94 06/20/2014 1151   BILITOT 0.5 12/16/2013 1350   BILITOT 0.60 06/13/2009 1503   GFRNONAA 54* 12/16/2013 1350   GFRAA 62* 12/16/2013 1350    No results found for this basename: SPEP,  UPEP,   kappa and lambda light chains    Lab Results  Component Value Date   WBC 3.5* 06/20/2014   NEUTROABS 2.3 06/20/2014   HGB 12.1* 06/20/2014   HCT 36.2* 06/20/2014   MCV 102.6* 06/20/2014   PLT 86* 06/20/2014      Chemistry      Component Value Date/Time   NA 142 06/20/2014 1151   NA 138 12/16/2013 1350   NA 145 06/13/2009 1503   K 3.8 06/20/2014 1151   K 3.6 12/16/2013 1350   K 4.8* 06/13/2009 1503   CL 104 12/16/2013 1350   CL 104 05/18/2013 1113   CL 108 06/13/2009 1503   CO2 26 06/20/2014 1151   CO2 24 12/16/2013 1350   CO2 29 06/13/2009 1503   BUN 9.3 06/20/2014 1151   BUN 10 12/16/2013 1350   BUN 49* 06/13/2009 1503   CREATININE 1.3 06/20/2014 1151   CREATININE 1.28 12/16/2013 1350   CREATININE 1.33 07/22/2012 1334   CREATININE 2.9* 06/13/2009 1503      Component Value Date/Time   CALCIUM 8.5 06/20/2014 1151   CALCIUM 7.7* 12/16/2013 1350   CALCIUM 10.5* 06/13/2009 1503   ALKPHOS 119 06/20/2014 1151   ALKPHOS 109 12/16/2013 1350   ALKPHOS 135* 06/13/2009 1503   AST 18 06/20/2014 1151   AST 26 12/16/2013 1350   AST 21 06/13/2009 1503   ALT 7 06/20/2014 1151  ALT <5 12/16/2013 1350   ALT 25 06/13/2009 1503   BILITOT 0.94 06/20/2014 1151   BILITOT 0.5 12/16/2013 1350   BILITOT 0.60 06/13/2009 1503     ASSESSMENT & PLAN:   Multiple myeloma in relapse Unfortunately, his light chain continued to get worse. I recommend discontinuation of current treatment. I discussed with him potential enrollment in clinical followup. He is interested. I would get our research coordinator to talk to him. An alternative would be refer him back to [facility] or to switch him back to Velcade along with Cytoxan The patient is undecided. I will follow with him next week.  Thrombocytopenia due to drugs The cause is likely due to recent treatment and disease.. It is mild and there is little change compared from previous platelet count. The patient denies recent history of bleeding such as epistaxis, hematuria or hematochezia. He is asymptomatic from the thrombocytopenia. I will observe for now.  he does not require transfusion now.     Anemia in neoplastic disease This is likely anemia of chronic disease. The patient denies recent history of bleeding such as epistaxis, hematuria or hematochezia. He is asymptomatic from the anemia. We will observe for now.  He does not require transfusion now.    CRI (chronic renal insufficiency) This is stable. Continue to observe closely.  Diabetic neuropathy, type II diabetes mellitus Continue to monitor closely. He continues taking pain medicine.  Leg edema He is currently on no diuretics. His weight remained stable.  All questions were answered. The patient knows to call the clinic with any problems, questions or concerns. No barriers to learning was detected. I spent 40 minutes counseling the patient face to face. The total time spent in the appointment was 55 minutes and more than 50% was on counseling and review of test results     [place], [place], MD 06/20/2014 3:30 PM

## 2014-06-20 NOTE — Assessment & Plan Note (Signed)
This is stable. Continue to observe closely.

## 2014-06-21 ENCOUNTER — Ambulatory Visit: Payer: Medicare Other

## 2014-06-23 ENCOUNTER — Emergency Department (HOSPITAL_COMMUNITY)
Admission: EM | Admit: 2014-06-23 | Discharge: 2014-06-23 | Disposition: A | Discharge status: Home / Self Care | Payer: Medicare Other | Attending: Emergency Medicine | Admitting: Emergency Medicine

## 2014-06-23 ENCOUNTER — Encounter (HOSPITAL_COMMUNITY): Payer: Self-pay | Admitting: Emergency Medicine

## 2014-06-23 DIAGNOSIS — Z8719 Personal history of other diseases of the digestive system: Secondary | ICD-10-CM | POA: Insufficient documentation

## 2014-06-23 DIAGNOSIS — Z86718 Personal history of other venous thrombosis and embolism: Secondary | ICD-10-CM | POA: Insufficient documentation

## 2014-06-23 DIAGNOSIS — E1149 Type 2 diabetes mellitus with other diabetic neurological complication: Secondary | ICD-10-CM | POA: Insufficient documentation

## 2014-06-23 DIAGNOSIS — Z8709 Personal history of other diseases of the respiratory system: Secondary | ICD-10-CM | POA: Insufficient documentation

## 2014-06-23 DIAGNOSIS — M79609 Pain in unspecified limb: Secondary | ICD-10-CM | POA: Insufficient documentation

## 2014-06-23 DIAGNOSIS — Z8619 Personal history of other infectious and parasitic diseases: Secondary | ICD-10-CM | POA: Insufficient documentation

## 2014-06-23 DIAGNOSIS — N189 Chronic kidney disease, unspecified: Secondary | ICD-10-CM | POA: Insufficient documentation

## 2014-06-23 DIAGNOSIS — E782 Mixed hyperlipidemia: Secondary | ICD-10-CM | POA: Insufficient documentation

## 2014-06-23 DIAGNOSIS — Z87891 Personal history of nicotine dependence: Secondary | ICD-10-CM | POA: Insufficient documentation

## 2014-06-23 DIAGNOSIS — M129 Arthropathy, unspecified: Secondary | ICD-10-CM | POA: Insufficient documentation

## 2014-06-23 DIAGNOSIS — C9002 Multiple myeloma in relapse: Secondary | ICD-10-CM | POA: Insufficient documentation

## 2014-06-23 DIAGNOSIS — Z7982 Long term (current) use of aspirin: Secondary | ICD-10-CM | POA: Insufficient documentation

## 2014-06-23 DIAGNOSIS — M79645 Pain in left finger(s): Secondary | ICD-10-CM

## 2014-06-23 DIAGNOSIS — E1142 Type 2 diabetes mellitus with diabetic polyneuropathy: Secondary | ICD-10-CM | POA: Insufficient documentation

## 2014-06-23 DIAGNOSIS — I129 Hypertensive chronic kidney disease with stage 1 through stage 4 chronic kidney disease, or unspecified chronic kidney disease: Secondary | ICD-10-CM | POA: Insufficient documentation

## 2014-06-23 DIAGNOSIS — Z79899 Other long term (current) drug therapy: Secondary | ICD-10-CM | POA: Insufficient documentation

## 2014-06-23 DIAGNOSIS — Z794 Long term (current) use of insulin: Secondary | ICD-10-CM | POA: Insufficient documentation

## 2014-06-23 DIAGNOSIS — Z862 Personal history of diseases of the blood and blood-forming organs and certain disorders involving the immune mechanism: Secondary | ICD-10-CM | POA: Insufficient documentation

## 2014-06-23 MED ORDER — SULFAMETHOXAZOLE-TRIMETHOPRIM 800-160 MG PO TABS: 1.0000 | ORAL_TABLET | Freq: Two times a day (BID) | ORAL | Status: AC

## 2014-06-23 MED ORDER — CEPHALEXIN 500 MG PO CAPS: 500.0000 mg | ORAL_CAPSULE | Freq: Two times a day (BID) | ORAL | Status: DC

## 2014-06-23 NOTE — Discharge Instructions (Signed)
Your finger pain is likely signs of finger infection.  Take antibiotics as prescribed.  Given that chemotherapy can also cause finger pain, please follow up with your oncologist for further evaluation.    Paronychia Paronychia is an inflammatory reaction involving the folds of the skin surrounding the fingernail. This is commonly caused by an infection in the skin around a nail. The most common cause of paronychia is frequent wetting of the hands (as seen with bartenders, food servers, nurses or others who wet their hands). This makes the skin around the fingernail susceptible to infection by bacteria (germs) or fungus. Other predisposing factors are:  Aggressive manicuring.  Nail biting.  Thumb sucking. The most common cause is a staphylococcal (a type of germ) infection, or a fungal (Candida) infection. When caused by a germ, it usually comes on suddenly with redness, swelling, pus and is often painful. It may get under the nail and form an abscess (collection of pus), or form an abscess around the nail. If the nail itself is infected with a fungus, the treatment is usually prolonged and may require oral medicine for up to one year. Your caregiver will determine the length of time treatment is required. The paronychia caused by bacteria (germs) may largely be avoided by not pulling on hangnails or picking at cuticles. When the infection occurs at the tips of the finger it is called felon. When the cause of paronychia is from the herpes simplex virus (HSV) it is called herpetic whitlow. TREATMENT  When an abscess is present treatment is often incision and drainage. This means that the abscess must be cut open so the pus can get out. When this is done, the following home care instructions should be followed. HOME CARE INSTRUCTIONS   It is important to keep the affected fingers very dry. Rubber or plastic gloves over cotton gloves should be used whenever the hand must be placed in water.  Keep wound  clean, dry and dressed as suggested by your caregiver between warm soaks or warm compresses.  Soak in warm water for fifteen to twenty minutes three to four times per day for bacterial infections. Fungal infections are very difficult to treat, so often require treatment for long periods of time.  For bacterial (germ) infections take antibiotics (medicine which kill germs) as directed and finish the prescription, even if the problem appears to be solved before the medicine is gone.  Only take over-the-counter or prescription medicines for pain, discomfort, or fever as directed by your caregiver. SEEK IMMEDIATE MEDICAL CARE IF:  You have redness, swelling, or increasing pain in the wound.  You notice pus coming from the wound.  You have a fever.  You notice a bad smell coming from the wound or dressing. Document Released: 06/03/2001 Document Revised: 03/01/2012 Document Reviewed: 02/02/2009 [facility] Patient Information 2015 [place], [place]. This information is not intended to replace advice given to you by your health care provider. Make sure you discuss any questions you have with your health care provider.

## 2014-06-23 NOTE — ED Provider Notes (Signed)
CSN: [id number]     Arrival date & time 06/23/14  1130 History   First MD Initiated Contact with Patient 06/23/14 1143     Chief Complaint  Patient presents with  . Finger Injury     (Consider location/radiation/quality/duration/timing/severity/associated sxs/prior Treatment) HPI  73 year old male with history of multiple myeloma currently receiving chemotherapy presents for evaluation of finger pain. Patient reports 3 days ago he noticed that his L thumb finger nail was half way off. States he pushed the nail back in place and has been having pain ever since. He described pain as a soreness sensation to the edges of his nail bed, 8/10, worsen with palpation. He also noticed swelling to the surrounding skin.  He has been soaking it in iodine. No associated fever chills or any recent injury. Patient is a diabetic. He denies any joint pain. He has never had symptoms before. Has history of diabetic neuropathy.  Past Medical History  Diagnosis Date  . Diabetes mellitus   . GERD (gastroesophageal reflux disease)   . Anemia   . Hypertension   . Prostatic hyperplasia   . Arthritis   . Multiple myeloma in relapse 12/24/2011  . Benign essential HTN 12/24/2011  . CRI (chronic renal insufficiency) 12/26/2011  . 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 DVT of lower extremity (deep venous thrombosis) 05/14/2011  . Hyperlipidemia, mixed 12/26/2011  . Herpes zoster infection 03/11/2010  . DM II (diabetes mellitus, type II), controlled 12/26/2011  . Pharyngitis, acute 03/21/2013  . 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 Diabetic neuropathy, type II diabetes mellitus 05/31/2013  . Urethritis 10/04/2013   Past Surgical History  Procedure Laterality Date  . Foot surgery Left    Family History  Problem Relation Age of Onset  . CAD Neg Hx    History  Substance Use Topics  . Smoking status: Former Smoker    Quit date: 03/08/2008  . Smokeless tobacco: Never  Used  . Alcohol Use: No    Review of Systems  All other systems reviewed and are negative.     Allergies  Review of patient's allergies indicates no known allergies.  Home Medications   Prior to Admission medications   Medication Sig Start Date End Date Taking? Authorizing Provider  acetaminophen (TYLENOL) 500 MG tablet Take 500 mg by mouth every 6 (six) hours as needed. For pain.    Historical Provider, MD  aspirin EC 81 MG tablet Take 81 mg by mouth daily after breakfast.     Historical Provider, MD  atorvastatin (LIPITOR) 10 MG tablet Take 10 mg by mouth at bedtime.      Historical Provider, MD  BD ULTRA-FINE PEN NEEDLES 29G X 12.7MM MISC Inject 30 Units into the skin daily.  05/03/13   Historical Provider, MD  carbidopa-levodopa (SINEMET IR) 25-100 MG per tablet TAKE 1 TABLET 3 TIMES A DAY 03/14/14   Marcial Pacas, MD  furosemide (LASIX) 40 MG tablet Take 1 tablet (40 mg total) by mouth daily. 06/19/14   Candee Furbish, MD  Insulin Glargine (LANTUS SOLOSTAR [place]) Inject 20-30 Units into the skin every evening.     Historical Provider, MD  lidocaine-prilocaine (EMLA) cream Apply 1 application topically as needed. Apply to port at least 1 hour before procedure & cover. 02/23/14   Annia Belt, MD  morphine (MS CONTIN) 30 MG 12 hr tablet Take 1 tablet (30 mg total) by mouth every 12 (twelve) hours. 05/23/14   Ni Alvy Bimler,  MD  morphine (MSIR) 15 MG tablet Take 1 tablet (15 mg total) by mouth every 4 (four) hours as needed for severe pain. 05/23/14   Heath Lark, MD  polyethylene glycol (MIRALAX / GLYCOLAX) packet Take 17 g by mouth daily after breakfast.     Historical Provider, MD  pregabalin (LYRICA) 100 MG capsule Take 1 capsule (100 mg total) by mouth 2 (two) times daily. 05/23/14   Heath Lark, MD  rasagiline (AZILECT) 1 MG TABS tablet Take 1 tablet (1 mg total) by mouth daily. 01/19/14   Dennie Bible, NP  valACYclovir (VALTREX) 500 MG tablet Take 1 tablet (500 mg total) by mouth daily.  Takes once daily. 05/23/14   Ni Gorsuch, MD   BP 121/61  Pulse 88  Temp(Src) 98.2 F (36.8 C) (Oral)  Resp 15  SpO2 97% Physical Exam  Constitutional: He appears well-developed and well-nourished. No distress.  HENT:  Head: Atraumatic.  Eyes: Conjunctivae are normal.  Neck: Normal range of motion. Neck supple.  Musculoskeletal: He exhibits tenderness (L thumb: tenderness to the proximal nail fold with very mild edema and faint erythema.  No abscess,  no nailbed involvement, no subungual hematoma, nailbed intact with normal color.  brisk cap refill, sensation intact. normal flexion/extension to PIP).  Neurological: He is alert.  Skin: No rash noted.  Psychiatric: He has a normal mood and affect.    ED Course  Procedures (including critical care time)  12:01 PM Pt report pain and swelling to proximal nail fold of L thumb.  Although not an obvious paronychia, this could be early signs of infection.  No nail bed involvement, and nail is not loose, no obvious signs of fungal infection.  No obvious abscess amenable for drainage.  Given hx of cancer currently on chemo and hx of diabetes, will start pt on abx (keflex/bactrim).  I discussed this with Dr. Aline Brochure.  Certain chemo agent can also affect the nails, i recommend pt to also f/u with his oncologist for further evaluation.    Labs Review Labs Reviewed - No data to display  Imaging Review No results found.   EKG Interpretation None      MDM   Final diagnoses:  Thumb pain, left    BP 121/61  Pulse 88  Temp(Src) 98.2 F (36.8 C) (Oral)  Resp 15  SpO2 97%      Domenic Moras, PA-C 06/23/14 1221

## 2014-06-23 NOTE — ED Notes (Signed)
Pt here for possible left thumb infection. sts nail came off and he pressed back on and is swollen and darkened. Pt is a diabetic and cancer pt.

## 2014-06-24 NOTE — ED Provider Notes (Signed)
Medical screening examination/treatment/procedure(s) were conducted as a shared visit with non-physician practitioner(s) and myself.  I personally evaluated the patient during the encounter.   EKG Interpretation None      I interviewed and examined the patient. Lungs are CTAB. Cardiac exam wnl. Abdomen soft. Left thumb nail w mild swelling at the proximal nail fold, possibly early infection, but very mild. Will cover w/ abx given comorbidities.   Blanchard Kelch, MD 06/24/14 1218

## 2014-06-27 ENCOUNTER — Ambulatory Visit: Payer: Medicare Other

## 2014-06-28 ENCOUNTER — Other Ambulatory Visit: Payer: Self-pay | Admitting: Hematology and Oncology

## 2014-06-28 ENCOUNTER — Ambulatory Visit: Payer: Medicare Other

## 2014-06-28 DIAGNOSIS — C9002 Multiple myeloma in relapse: Secondary | ICD-10-CM

## 2014-06-28 MED ORDER — LENALIDOMIDE 25 MG PO CAPS: 25.0000 mg | ORAL_CAPSULE | Freq: Every day | ORAL | Status: DC

## 2014-06-29 ENCOUNTER — Telehealth: Payer: Self-pay | Admitting: *Deleted

## 2014-06-29 NOTE — Telephone Encounter (Signed)
Asked wife to bring pt in this afternoon or tomorrow to sign consent forms for Revlimid.  Wife says she will bring pt tomorrow around 11 am to sign consents.

## 2014-06-30 ENCOUNTER — Encounter: Payer: Self-pay | Admitting: Hematology and Oncology

## 2014-06-30 ENCOUNTER — Telehealth: Payer: Self-pay | Admitting: *Deleted

## 2014-06-30 ENCOUNTER — Ambulatory Visit (HOSPITAL_COMMUNITY): Payer: Medicare Other | Attending: Internal Medicine | Admitting: Radiology

## 2014-06-30 DIAGNOSIS — R072 Precordial pain: Secondary | ICD-10-CM | POA: Insufficient documentation

## 2014-06-30 DIAGNOSIS — C9000 Multiple myeloma not having achieved remission: Secondary | ICD-10-CM | POA: Insufficient documentation

## 2014-06-30 DIAGNOSIS — E785 Hyperlipidemia, unspecified: Secondary | ICD-10-CM | POA: Insufficient documentation

## 2014-06-30 DIAGNOSIS — R609 Edema, unspecified: Secondary | ICD-10-CM | POA: Insufficient documentation

## 2014-06-30 DIAGNOSIS — Z86718 Personal history of other venous thrombosis and embolism: Secondary | ICD-10-CM | POA: Diagnosis not present

## 2014-06-30 DIAGNOSIS — G20 Parkinson's disease: Secondary | ICD-10-CM | POA: Diagnosis not present

## 2014-06-30 DIAGNOSIS — M2550 Pain in unspecified joint: Secondary | ICD-10-CM | POA: Insufficient documentation

## 2014-06-30 DIAGNOSIS — G20A1 Parkinson's disease without dyskinesia, without mention of fluctuations: Secondary | ICD-10-CM | POA: Insufficient documentation

## 2014-06-30 DIAGNOSIS — E119 Type 2 diabetes mellitus without complications: Secondary | ICD-10-CM | POA: Diagnosis not present

## 2014-06-30 DIAGNOSIS — R0789 Other chest pain: Secondary | ICD-10-CM

## 2014-06-30 DIAGNOSIS — I059 Rheumatic mitral valve disease, unspecified: Secondary | ICD-10-CM | POA: Diagnosis not present

## 2014-06-30 DIAGNOSIS — R600 Localized edema: Secondary | ICD-10-CM

## 2014-06-30 NOTE — Progress Notes (Signed)
Echocardiogram performed.  

## 2014-06-30 NOTE — Progress Notes (Signed)
Faxed revlimid prescription to [facility] °

## 2014-06-30 NOTE — Telephone Encounter (Signed)
Pt and wife came into clinic today to sign consent forms for Revlimid.  Pt information given and reviewed w/ pt and wife.  Pt signed consents and enrolled in [facility].  REMS auth # [id number].  Rx given to [facility] in managed care dept for prior auth..  Instructed wife to call us if she knows Revlimid  Is going to be delivered so we can arrange pt's schedule to start chemo.  Pt will also need Rx for dexamethasone.  She verbalized understanding.

## 2014-07-04 ENCOUNTER — Other Ambulatory Visit: Payer: Self-pay | Admitting: *Deleted

## 2014-07-04 ENCOUNTER — Ambulatory Visit (HOSPITAL_BASED_OUTPATIENT_CLINIC_OR_DEPARTMENT_OTHER): Payer: Medicare Other | Admitting: Hematology and Oncology

## 2014-07-04 ENCOUNTER — Ambulatory Visit: Payer: Medicare Other

## 2014-07-04 ENCOUNTER — Telehealth: Payer: Self-pay | Admitting: Hematology and Oncology

## 2014-07-04 ENCOUNTER — Ambulatory Visit (HOSPITAL_BASED_OUTPATIENT_CLINIC_OR_DEPARTMENT_OTHER): Payer: Medicare Other

## 2014-07-04 ENCOUNTER — Encounter: Payer: Self-pay | Admitting: Hematology and Oncology

## 2014-07-04 ENCOUNTER — Other Ambulatory Visit: Payer: Self-pay | Admitting: Hematology and Oncology

## 2014-07-04 ENCOUNTER — Telehealth: Payer: Self-pay | Admitting: *Deleted

## 2014-07-04 VITALS — BP 117/61 | HR 75 | Temp 98.0°F | Resp 18 | Ht 71.0 in | Wt 230.8 lb

## 2014-07-04 DIAGNOSIS — C9002 Multiple myeloma in relapse: Secondary | ICD-10-CM

## 2014-07-04 DIAGNOSIS — Z95828 Presence of other vascular implants and grafts: Secondary | ICD-10-CM

## 2014-07-04 DIAGNOSIS — R600 Localized edema: Secondary | ICD-10-CM

## 2014-07-04 DIAGNOSIS — R609 Edema, unspecified: Secondary | ICD-10-CM

## 2014-07-04 DIAGNOSIS — E1149 Type 2 diabetes mellitus with other diabetic neurological complication: Secondary | ICD-10-CM

## 2014-07-04 DIAGNOSIS — N189 Chronic kidney disease, unspecified: Secondary | ICD-10-CM

## 2014-07-04 DIAGNOSIS — R5383 Other fatigue: Secondary | ICD-10-CM

## 2014-07-04 DIAGNOSIS — D6959 Other secondary thrombocytopenia: Secondary | ICD-10-CM

## 2014-07-04 DIAGNOSIS — E119 Type 2 diabetes mellitus without complications: Secondary | ICD-10-CM

## 2014-07-04 DIAGNOSIS — D630 Anemia in neoplastic disease: Secondary | ICD-10-CM

## 2014-07-04 DIAGNOSIS — T50905A Adverse effect of unspecified drugs, medicaments and biological substances, initial encounter: Secondary | ICD-10-CM

## 2014-07-04 DIAGNOSIS — E1140 Type 2 diabetes mellitus with diabetic neuropathy, unspecified: Secondary | ICD-10-CM

## 2014-07-04 DIAGNOSIS — R5381 Other malaise: Secondary | ICD-10-CM

## 2014-07-04 DIAGNOSIS — N182 Chronic kidney disease, stage 2 (mild): Secondary | ICD-10-CM

## 2014-07-04 DIAGNOSIS — E1142 Type 2 diabetes mellitus with diabetic polyneuropathy: Secondary | ICD-10-CM

## 2014-07-04 LAB — CBC WITH DIFFERENTIAL/PLATELET
BASO%: 0.6 % (ref 0.0–2.0)
BASOS ABS: 0 10*3/uL (ref 0.0–0.1)
EOS%: 1.8 % (ref 0.0–7.0)
Eosinophils Absolute: 0.1 10*3/uL (ref 0.0–0.5)
HEMATOCRIT: 39.1 % (ref 38.4–49.9)
HEMOGLOBIN: 13.1 g/dL (ref 13.0–17.1)
LYMPH#: 1.5 10*3/uL (ref 0.9–3.3)
LYMPH%: 31.4 % (ref 14.0–49.0)
MCH: 33.9 pg — ABNORMAL HIGH (ref 27.2–33.4)
MCHC: 33.4 g/dL (ref 32.0–36.0)
MCV: 101.5 fL — ABNORMAL HIGH (ref 79.3–98.0)
MONO#: 1 10*3/uL — AB (ref 0.1–0.9)
MONO%: 21.1 % — ABNORMAL HIGH (ref 0.0–14.0)
NEUT#: 2.1 10*3/uL (ref 1.5–6.5)
NEUT%: 45.1 % (ref 39.0–75.0)
Platelets: 95 10*3/uL — ABNORMAL LOW (ref 140–400)
RBC: 3.85 10*6/uL — ABNORMAL LOW (ref 4.20–5.82)
RDW: 13.3 % (ref 11.0–14.6)
WBC: 4.8 10*3/uL (ref 4.0–10.3)

## 2014-07-04 LAB — COMPREHENSIVE METABOLIC PANEL (CC13)
ALBUMIN: 3.9 g/dL (ref 3.5–5.0)
ALT: 6 U/L (ref 0–55)
AST: 20 U/L (ref 5–34)
Alkaline Phosphatase: 136 U/L (ref 40–150)
Anion Gap: 8 mEq/L (ref 3–11)
BUN: 16.7 mg/dL (ref 7.0–26.0)
CALCIUM: 8.9 mg/dL (ref 8.4–10.4)
CHLORIDE: 106 meq/L (ref 98–109)
CO2: 28 mEq/L (ref 22–29)
CREATININE: 1.9 mg/dL — AB (ref 0.7–1.3)
Glucose: 129 mg/dl (ref 70–140)
Potassium: 4 mEq/L (ref 3.5–5.1)
Sodium: 142 mEq/L (ref 136–145)
Total Bilirubin: 0.86 mg/dL (ref 0.20–1.20)
Total Protein: 6.3 g/dL — ABNORMAL LOW (ref 6.4–8.3)

## 2014-07-04 MED ORDER — DEXAMETHASONE 4 MG PO TABS: 28.0000 mg | ORAL_TABLET | ORAL | Status: DC

## 2014-07-04 MED ORDER — SODIUM CHLORIDE 0.9 % IJ SOLN
10.0000 mL | INTRAMUSCULAR | Status: DC | PRN
  Administered 2014-07-04: 10 mL via INTRAVENOUS
  Filled 2014-07-04: qty 10

## 2014-07-04 MED ORDER — HEPARIN SOD (PORK) LOCK FLUSH 100 UNIT/ML IV SOLN
500.0000 [IU] | Freq: Once | INTRAVENOUS | Status: AC
  Administered 2014-07-04: 500 [IU] via INTRAVENOUS
  Filled 2014-07-04: qty 5

## 2014-07-04 NOTE — Assessment & Plan Note (Signed)
This is slightly worse likely due to dehydration and recent diuretic therapy. Lasix was discontinued. I will recheck his kidney function again later this week but I will not hold chemotherapy

## 2014-07-04 NOTE — Telephone Encounter (Signed)
I have scheduled per research RN. Research to call patient

## 2014-07-04 NOTE — Assessment & Plan Note (Signed)
This has improved. I told him to discontinue Lasix due to elevated creatinine.

## 2014-07-04 NOTE — Assessment & Plan Note (Signed)
Continue to monitor closely. He continues taking pain medicine.

## 2014-07-04 NOTE — Assessment & Plan Note (Signed)
This is likely anemia of chronic disease. The patient denies recent history of bleeding such as epistaxis, hematuria or hematochezia. He is asymptomatic from the anemia. We will observe for now.  He does not require transfusion now.   

## 2014-07-04 NOTE — Assessment & Plan Note (Signed)
The patient had progressed on chemotherapy recently. His best option would be to enroll in clinical trial with the expanded access for Elotuzumab. With the assistant from the research assistant, the patient was deemed eligible for the study. We discussed the role of chemotherapy. The intent is palliative.  We discussed some of the risks, benefits, side-effects of Elotuzumab, Revlimid and dexamethasone Some of the short term side-effects included, though not limited to, risk of fatigue, pancytopenia, life-threatening infections, allergic reactions, nausea, vomiting, sores in the mouth, changes in bowel habits especially diarrhea, admission to hospital for various reasons, and risks of death.   The patient is aware that the response rates discussed earlier is not guaranteed.    After a long discussion, patient made an informed decision to proceed with the prescribed plan of care and went ahead to sign the consent form today.   Patient education material was dispensed. He will remain on 81 mg aspirin for DVT prophylaxis. He will have blood work checked on a weekly basis. Will proceed using his labs from today for chemotherapy this week. I recommend rechecking his blood again later this week to check his kidney function. IV bisphosphonates were not given due to poor dentition

## 2014-07-04 NOTE — Patient Instructions (Signed)

## 2014-07-04 NOTE — Telephone Encounter (Signed)
pt will r/s 8.14 appt so chemo can be sched

## 2014-07-04 NOTE — Assessment & Plan Note (Signed)
His blood sugar may go high during treatment due to high-dose dexamethasone. I feel it is prudent to start with high dose specified in chemotherapy protocol to reduce his risk of allergic reaction to chemotherapy. If he does well, and we'll consider weaning him down to the minimum dose of dexamethasone, depending on blood sugar control.

## 2014-07-04 NOTE — Telephone Encounter (Signed)
added appt per Haynes Bast pt aware

## 2014-07-04 NOTE — Assessment & Plan Note (Signed)
The cause is likely due to recent treatment and disease.. It is mild and there is little change compared from previous platelet count. The patient denies recent history of bleeding such as epistaxis, hematuria or hematochezia. He is asymptomatic from the thrombocytopenia. I will observe for now.  he does not require transfusion now.  As long as his platelet count is above 50,000, he will remain on aspirin 81 mg daily to prevent DVT.

## 2014-07-04 NOTE — Progress Notes (Signed)
[facility] OFFICE PROGRESS NOTE  Patient Care Team: Kandice Hams, MD as PCP - General (Internal Medicine) Annia Belt, MD as Consulting Physician (Internal Medicine) Heath Lark, MD as Consulting Physician (Hematology and Oncology)  SUMMARY OF ONCOLOGIC HISTORY:  He was initially diagnosed in June 2010. He had a heavy myeloma burden at time of diagnosis with multiple lytic bone lesions, 87% plasma cells in the bone marrow, and 6.7 g of protein in the urine. 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 He went on to receive high-dose IV melphalan with autologous stem cell support at [facility] 03/12/2010. We began to see small amounts of monoclonal lambda free light chains again in his urine in March of 2012. He was started on Revlimid 10 mg daily beginning in April 2012. Dose had to be adjusted due to myelosuppression down to 5 mg. At time of a visit here in December 2013, he was complaining of recurrent pain in his shoulders, bilateral ribs, and low back. Lab showed rise in his serum and urine paraprotein levels. He was restaged with a bone marrow biopsy on 12/31/2012 which showed 10% plasma cells, rise in total urine protein from 260 mg to 1039 mg, and rise in serum free lambda light chains from 4.8 mg percent in July 2013,to 10 mg percent on October 29, to a 25.8 mg percent by December 16, to 58.5 mg percent by 01/07/2013. He was then started him on pomalidomide on 01/28/2013. Initial dose 3 mg 21 days on 7 days rest.  He tolerated the drug well. Unfortunately, at time of his visit here on August 2014 he appeared to be breaking through the pomalidomide. Although his hematologic profile remained stable, there was a progressive rise in the lambda free light chains up to 116 mg percent as of 07/26/2013 compared with 58 on May 28. 24 hour urine total protein back up to 6645 mg. After a lengthy discussion  with respect to treatment options, he was started on trial of Bendamustine plus prednisone. This was started on September 4. He had 6 cycles through  02/01/2014. He appeared to have a initial improvement with fall in his serum lambda free light chains from 116 mg percent down to 91 by November 2014, fall in 24 hour urine protein from 6.6 g down to 5.4, fall in his creatinine from 1.4 down to 1.1. Unfortunately, recent reevaluation done on February 01, 2014 shows rise in the serum light chains up to 261 mg percent, urine 24-hour protein up to 14.4 g, and creatinine up to 1.3.  On 02/28/2014, he was started on a trial of kyprolis. Repeat serum lambda light chain on 04/10/2014 showed that the patient is responding to treatment. On 06/20/2014, repeat serum light chain is getting worse and treatment was discontinued. The patient is enrolled in clinical trial and will begin first dose of chemotherapy using combination dexamethasone, Revlimid and Elotuzumab on 07/07/2014. INTERVAL HISTORY: Please see below for problem oriented charting. He complained of feeling fatigued and weak. He denies constipation. He uses pain medication sporadically. Denies recent nausea. His leg swelling has improved. His diabetes remains poorly controlled due to sporadic eating habits.  REVIEW OF SYSTEMS:   Constitutional: Denies fevers, chills or abnormal weight loss Eyes: Denies blurriness of vision Ears, nose, mouth, throat, and face: Denies mucositis or sore throat Respiratory: Denies cough, dyspnea or wheezes Cardiovascular: Denies palpitation, chest discomfort  Gastrointestinal:  Denies nausea, heartburn or change in bowel habits Skin: Denies abnormal skin rashes Lymphatics: Denies new lymphadenopathy or easy bruising Neurological:Denies numbness, tingling or new weaknesses Behavioral/Psych: Mood is stable, no new changes  All other systems were reviewed with the patient and are negative.  I have reviewed the past  medical history, past surgical history, social history and family history with the patient and they are unchanged from previous note.  ALLERGIES:  has No Known Allergies.  MEDICATIONS:  Current Outpatient Prescriptions  Medication Sig Dispense Refill  . acetaminophen (TYLENOL) 500 MG tablet Take 500 mg by mouth every 6 (six) hours as needed. For pain.      Marland Kitchen aspirin EC 81 MG tablet Take 81 mg by mouth daily after breakfast.       . atorvastatin (LIPITOR) 10 MG tablet Take 10 mg by mouth at bedtime.        . BD ULTRA-FINE PEN NEEDLES 29G X 12.7MM MISC Inject 30 Units into the skin daily.       . carbidopa-levodopa (SINEMET IR) 25-100 MG per tablet TAKE 1 TABLET 3 TIMES A DAY  90 tablet  6  . furosemide (LASIX) 40 MG tablet Take 1 tablet (40 mg total) by mouth daily.  30 tablet  11  . Insulin Glargine (LANTUS SOLOSTAR [place]) Inject 20-30 Units into the skin every evening.       . lidocaine-prilocaine (EMLA) cream Apply 1 application topically as needed. Apply to port at least 1 hour before procedure & cover.  30 g  1  . morphine (MS CONTIN) 30 MG 12 hr tablet Take 1 tablet (30 mg total) by mouth every 12 (twelve) hours.  60 tablet  0  . morphine (MSIR) 15 MG tablet Take 1 tablet (15 mg total) by mouth every 4 (four) hours as needed for severe pain.  90 tablet  0  . pregabalin (LYRICA) 100 MG capsule Take 1 capsule (100 mg total) by mouth 2 (two) times daily.  60 capsule  1  . rasagiline (AZILECT) 1 MG TABS tablet Take 1 tablet (1 mg total) by mouth daily.  30 tablet  6  . valACYclovir (VALTREX) 500 MG tablet Take 1 tablet (500 mg total) by mouth daily. Takes once daily.  90 tablet  3  . dexamethasone (DECADRON) 4 MG tablet Take 7 tablets (28 mg total) by mouth once a week.  100 tablet  1  . lenalidomide (REVLIMID) 25 MG capsule Take 1 capsule (25 mg total) by mouth daily.  21 capsule  0  . polyethylene glycol (MIRALAX / GLYCOLAX) packet Take 17 g by mouth daily after breakfast.        No current  facility-administered medications for this visit.   Facility-Administered Medications Ordered in Other Visits  Medication Dose Route Frequency Provider Last Rate Last Dose  . sodium chloride 0.9 % injection 10 mL  10 mL Intravenous PRN Heath Lark, MD   10 mL at 05/09/14 1045    PHYSICAL EXAMINATION: ECOG PERFORMANCE STATUS: 1 - Symptomatic but completely ambulatory  Filed Vitals:   07/04/14 1544  BP: 117/61  Pulse: 75  Temp: 98 F (36.7 C)  Resp: 18   Filed Weights   07/04/14 1544  Weight: 230 lb 12.8 oz (104.69 kg)    GENERAL:alert, no distress and comfortable SKIN: skin color, texture, turgor are normal, no rashes or significant lesions EYES: normal, Conjunctiva are pink and non-injected, sclera clear OROPHARYNX:no exudate, no erythema and lips, buccal mucosa, and tongue normal  NECK: supple, thyroid normal size, non-tender, without nodularity LYMPH:  no palpable lymphadenopathy in the cervical, axillary or inguinal LUNGS: clear to auscultation and percussion with normal breathing effort HEART: regular rate & rhythm and no murmurs with mild bilateral lower extremity edema ABDOMEN:abdomen soft, non-tender and normal bowel sounds Musculoskeletal:no cyanosis of digits and no clubbing  NEURO: alert & oriented x 3 with fluent speech, no focal motor/sensory deficits  LABORATORY DATA:  I have reviewed the data as listed    Component Value Date/Time   NA 142 07/04/2014 1510   NA 138 12/16/2013 1350   NA 145 06/13/2009 1503   K 4.0 07/04/2014 1510   K 3.6 12/16/2013 1350   K 4.8* 06/13/2009 1503   CL 104 12/16/2013 1350   CL 104 05/18/2013 1113   CL 108 06/13/2009 1503   CO2 28 07/04/2014 1510   CO2 24 12/16/2013 1350   CO2 29 06/13/2009 1503   GLUCOSE 129 07/04/2014 1510   GLUCOSE 192* 12/16/2013 1350   GLUCOSE 325* 05/18/2013 1113   GLUCOSE 114 06/13/2009 1503   BUN 16.7 07/04/2014 1510   BUN 10 12/16/2013 1350   BUN 49* 06/13/2009 1503   CREATININE 1.9* 07/04/2014 1510    CREATININE 1.28 12/16/2013 1350   CREATININE 1.33 07/22/2012 1334   CREATININE 2.9* 06/13/2009 1503   CALCIUM 8.9 07/04/2014 1510   CALCIUM 7.7* 12/16/2013 1350   CALCIUM 10.5* 06/13/2009 1503   PROT 6.3* 07/04/2014 1510   PROT 5.1* 12/16/2013 1350   PROT 6.4 06/13/2009 1503   ALBUMIN 3.9 07/04/2014 1510   ALBUMIN 2.9* 12/16/2013 1350   AST 20 07/04/2014 1510   AST 26 12/16/2013 1350   AST 21 06/13/2009 1503   ALT 6 07/04/2014 1510   ALT <5 12/16/2013 1350   ALT 25 06/13/2009 1503   ALKPHOS 136 07/04/2014 1510   ALKPHOS 109 12/16/2013 1350   ALKPHOS 135* 06/13/2009 1503   BILITOT 0.86 07/04/2014 1510   BILITOT 0.5 12/16/2013 1350   BILITOT 0.60 06/13/2009 1503   GFRNONAA 54* 12/16/2013 1350   GFRAA 62* 12/16/2013 1350    No results found for this basename: SPEP,  UPEP,   kappa and lambda light chains    Lab Results  Component Value Date   WBC 4.8 07/04/2014   NEUTROABS 2.1 07/04/2014   HGB 13.1 07/04/2014   HCT 39.1 07/04/2014   MCV 101.5* 07/04/2014   PLT 95* 07/04/2014      Chemistry      Component Value Date/Time   NA 142 07/04/2014 1510   NA 138 12/16/2013 1350   NA 145 06/13/2009 1503   K 4.0 07/04/2014 1510   K 3.6 12/16/2013 1350   K 4.8* 06/13/2009 1503   CL 104 12/16/2013 1350   CL 104 05/18/2013 1113   CL 108 06/13/2009 1503   CO2 28 07/04/2014 1510   CO2 24 12/16/2013 1350   CO2 29 06/13/2009 1503   BUN 16.7 07/04/2014 1510   BUN 10 12/16/2013 1350   BUN 49* 06/13/2009 1503   CREATININE 1.9* 07/04/2014 1510   CREATININE 1.28 12/16/2013 1350   CREATININE 1.33 07/22/2012 1334   CREATININE 2.9* 06/13/2009 1503      Component Value Date/Time   CALCIUM 8.9 07/04/2014 1510   CALCIUM 7.7* 12/16/2013 1350   CALCIUM 10.5* 06/13/2009 1503   ALKPHOS 136 07/04/2014 1510   ALKPHOS 109 12/16/2013 1350   ALKPHOS 135* 06/13/2009 1503   AST 20 07/04/2014 1510   AST 26 12/16/2013  1350   AST 21 06/13/2009 1503   ALT 6 07/04/2014 1510   ALT <5 12/16/2013 1350   ALT 25 06/13/2009 1503   BILITOT  0.86 07/04/2014 1510   BILITOT 0.5 12/16/2013 1350   BILITOT 0.60 06/13/2009 1503      ASSESSMENT & PLAN:  Multiple myeloma in relapse The patient had progressed on chemotherapy recently. His best option would be to enroll in clinical trial with the expanded access for Elotuzumab. With the assistant from the research assistant, the patient was deemed eligible for the study. We discussed the role of chemotherapy. The intent is palliative.  We discussed some of the risks, benefits, side-effects of Elotuzumab, Revlimid and dexamethasone Some of the short term side-effects included, though not limited to, risk of fatigue, pancytopenia, life-threatening infections, allergic reactions, nausea, vomiting, sores in the mouth, changes in bowel habits especially diarrhea, admission to hospital for various reasons, and risks of death.   The patient is aware that the response rates discussed earlier is not guaranteed.    After a long discussion, patient made an informed decision to proceed with the prescribed plan of care and went ahead to sign the consent form today.   Patient education material was dispensed. He will remain on 81 mg aspirin for DVT prophylaxis. He will have blood work checked on a weekly basis. Will proceed using his labs from today for chemotherapy this week. I recommend rechecking his blood again later this week to check his kidney function. IV bisphosphonates were not given due to poor dentition     Thrombocytopenia due to drugs The cause is likely due to recent treatment and disease.. It is mild and there is little change compared from previous platelet count. The patient denies recent history of bleeding such as epistaxis, hematuria or hematochezia. He is asymptomatic from the thrombocytopenia. I will observe for now.  he does not require transfusion now.  As long as his platelet count is above 50,000, he will remain on aspirin 81 mg daily to prevent DVT.     Leg  edema This has improved. I told him to discontinue Lasix due to elevated creatinine.  Diabetic neuropathy, type II diabetes mellitus Continue to monitor closely. He continues taking pain medicine.    CRI (chronic renal insufficiency) This is slightly worse likely due to dehydration and recent diuretic therapy. Lasix was discontinued. I will recheck his kidney function again later this week but I will not hold chemotherapy  Anemia in neoplastic disease This is likely anemia of chronic disease. The patient denies recent history of bleeding such as epistaxis, hematuria or hematochezia. He is asymptomatic from the anemia. We will observe for now.  He does not require transfusion now.    DM II (diabetes mellitus, type II), controlled His blood sugar may go high during treatment due to high-dose dexamethasone. I feel it is prudent to start with high dose specified in chemotherapy protocol to reduce his risk of allergic reaction to chemotherapy. If he does well, and we'll consider weaning him down to the minimum dose of dexamethasone, depending on blood sugar control.      All questions were answered. The patient knows to call the clinic with any problems, questions or concerns. No barriers to learning was detected. I spent 40 minutes counseling the patient face to face. The total time spent in the appointment was 60 minutes and more than 50% was on counseling and review of test results     [facility], Loran Auguste, MD 07/04/2014 5:41  PM

## 2014-07-04 NOTE — Telephone Encounter (Signed)
gv and printed appts ched and avs for pt for July and Aug...sed added tx. °

## 2014-07-05 ENCOUNTER — Ambulatory Visit: Payer: Medicare Other

## 2014-07-06 ENCOUNTER — Encounter: Payer: Self-pay | Admitting: *Deleted

## 2014-07-06 DIAGNOSIS — C9002 Multiple myeloma in relapse: Secondary | ICD-10-CM

## 2014-07-06 LAB — SPEP & IFE WITH QIG
ALBUMIN ELP: 66.5 % — AB (ref 55.8–66.1)
ALPHA-1-GLOBULIN: 5.3 % — AB (ref 2.9–4.9)
ALPHA-2-GLOBULIN: 10.1 % (ref 7.1–11.8)
BETA 2: 3.6 % (ref 3.2–6.5)
BETA GLOBULIN: 6.6 % (ref 4.7–7.2)
GAMMA GLOBULIN: 7.9 % — AB (ref 11.1–18.8)
IgA: 7 mg/dL — ABNORMAL LOW (ref 68–379)
IgG (Immunoglobin G), Serum: 243 mg/dL — ABNORMAL LOW (ref 650–1600)
IgM, Serum: <5 mg/dL — ABNORMAL LOW (ref 41–251)
M-Spike, %: 0.28 g/dL
Total Protein, Serum Electrophoresis: 6 g/dL (ref 6.0–8.3)

## 2014-07-06 LAB — KAPPA/LAMBDA LIGHT CHAINS
KAPPA LAMBDA RATIO: 0 — AB (ref 0.26–1.65)
Kappa free light chain: 0.03 mg/dL — ABNORMAL LOW (ref 0.33–1.94)
Lambda Free Lght Chn: 871 mg/dL — ABNORMAL HIGH (ref 0.57–2.63)

## 2014-07-06 LAB — BETA 2 MICROGLOBULIN, SERUM: BETA 2 MICROGLOBULIN: 9.61 mg/L — AB (ref <=2.51)

## 2014-07-06 NOTE — Progress Notes (Signed)
07/06/14 BMS 204-143  Spoke with [name] to confirm receipt of revlimed. Also reminded her about the requirement for [name] [name] to take 28 mg. Decadron between 24 - 3 hours before starting elotuzumzb. She will see that he takes it between 6 and 7 AM tomorrow morning.   07/07/2014 [name] [name] is in he cancer center this morning for day 1 treatment on the [id number] clinical trial. He reported taking the 28 mg of decadron at 6:30 this morning. His wife confirmed the took the decadron as ordered this morning at 6:30. He took his first dose of revlimid in the cancer center this morning. Elotuzumab dose calculated using today's weight. Sign for infusion given to D. Gloriann Loan, RN. Reviewed requirements for pre-medications and timing.   07/14/14 [name] [name] is in the cancer center today for cycle 1, day 8 treatment on the BMS 204-143 study. He took decadron as ordered this morning before 7AM. He is continuing to take  Sign for infusion given to D. Dimitri Ped, Therapist, sports.  Reviewed requirements for pre-medications and timing.  07/21/14 [name] [name] is in the cancer center today for cycle 1, day 15 treatment. He was seen by Dr. Alvy Bimler. His wife confirms he took decadron as ordered this morning and is taking revlimid daily.  Sign for infusion given to K. Bennett Scrape, RN.  Reviewed requirements for pre-medications and timing.  07/28/14 [name] [name] is in the cancer center today for cycle 1,  day 22 treatment on the BMS [id number] study. He took decadron 20 mg this morning at 6:30. He took his last dose of revlimid yesterday.  Sign for infusion given to L. O'Flaherty, RN.  Reviewed requirements for pre-medications and timing.

## 2014-07-07 ENCOUNTER — Other Ambulatory Visit (HOSPITAL_COMMUNITY): Payer: Medicare Other

## 2014-07-07 ENCOUNTER — Other Ambulatory Visit (HOSPITAL_BASED_OUTPATIENT_CLINIC_OR_DEPARTMENT_OTHER): Payer: Medicare Other

## 2014-07-07 ENCOUNTER — Ambulatory Visit (HOSPITAL_BASED_OUTPATIENT_CLINIC_OR_DEPARTMENT_OTHER): Payer: Medicare Other

## 2014-07-07 ENCOUNTER — Ambulatory Visit: Payer: Medicare Other

## 2014-07-07 VITALS — BP 130/57 | HR 63 | Temp 97.9°F | Resp 18 | Ht 71.0 in | Wt 233.5 lb

## 2014-07-07 DIAGNOSIS — IMO0002 Reserved for concepts with insufficient information to code with codable children: Secondary | ICD-10-CM

## 2014-07-07 DIAGNOSIS — N182 Chronic kidney disease, stage 2 (mild): Secondary | ICD-10-CM

## 2014-07-07 DIAGNOSIS — Z95828 Presence of other vascular implants and grafts: Secondary | ICD-10-CM

## 2014-07-07 DIAGNOSIS — Z5112 Encounter for antineoplastic immunotherapy: Secondary | ICD-10-CM

## 2014-07-07 DIAGNOSIS — C9002 Multiple myeloma in relapse: Secondary | ICD-10-CM

## 2014-07-07 DIAGNOSIS — B029 Zoster without complications: Secondary | ICD-10-CM

## 2014-07-07 LAB — BUN AND CREATININE (CC13)
BUN: 11.1 mg/dL (ref 7.0–26.0)
CREATININE: 1.5 mg/dL — AB (ref 0.7–1.3)

## 2014-07-07 MED ORDER — FAMOTIDINE IN NACL 20-0.9 MG/50ML-% IV SOLN
20.0000 mg | Freq: Once | INTRAVENOUS | Status: AC
  Administered 2014-07-07: 20 mg via INTRAVENOUS

## 2014-07-07 MED ORDER — DEXAMETHASONE SODIUM PHOSPHATE 10 MG/ML IJ SOLN
INTRAMUSCULAR | Status: AC
  Filled 2014-07-07: qty 1

## 2014-07-07 MED ORDER — SODIUM CHLORIDE 0.9 % IV SOLN
Freq: Once | INTRAVENOUS | Status: AC
  Administered 2014-07-07: 10:00:00 via INTRAVENOUS

## 2014-07-07 MED ORDER — SODIUM CHLORIDE 0.9 % IJ SOLN
10.0000 mL | INTRAMUSCULAR | Status: DC | PRN
  Administered 2014-07-07: 10 mL
  Filled 2014-07-07: qty 10

## 2014-07-07 MED ORDER — DIPHENHYDRAMINE HCL 25 MG PO CAPS
ORAL_CAPSULE | ORAL | Status: AC
  Filled 2014-07-07: qty 2

## 2014-07-07 MED ORDER — ACETAMINOPHEN 325 MG PO TABS
ORAL_TABLET | ORAL | Status: AC
  Filled 2014-07-07: qty 2

## 2014-07-07 MED ORDER — DIPHENHYDRAMINE HCL 25 MG PO CAPS
50.0000 mg | ORAL_CAPSULE | Freq: Once | ORAL | Status: AC
  Administered 2014-07-07: 50 mg via ORAL

## 2014-07-07 MED ORDER — FAMOTIDINE IN NACL 20-0.9 MG/50ML-% IV SOLN
INTRAVENOUS | Status: AC
  Filled 2014-07-07: qty 50

## 2014-07-07 MED ORDER — DEXAMETHASONE SODIUM PHOSPHATE 10 MG/ML IJ SOLN
8.0000 mg | Freq: Once | INTRAMUSCULAR | Status: AC
  Administered 2014-07-07: 8 mg via INTRAVENOUS

## 2014-07-07 MED ORDER — SODIUM CHLORIDE 0.9 % IJ SOLN
10.0000 mL | INTRAMUSCULAR | Status: DC | PRN
  Administered 2014-07-07: 10 mL via INTRAVENOUS
  Filled 2014-07-07: qty 10

## 2014-07-07 MED ORDER — DIPHENHYDRAMINE HCL 50 MG/ML IJ SOLN
INTRAMUSCULAR | Status: AC
  Filled 2014-07-07: qty 1

## 2014-07-07 MED ORDER — HEPARIN SOD (PORK) LOCK FLUSH 100 UNIT/ML IV SOLN
500.0000 [IU] | Freq: Once | INTRAVENOUS | Status: AC | PRN
  Administered 2014-07-07: 500 [IU]
  Filled 2014-07-07: qty 5

## 2014-07-07 MED ORDER — SODIUM CHLORIDE 0.9 % IV SOLN
1060.0000 mg | Freq: Once | INTRAVENOUS | Status: AC
  Administered 2014-07-07: 1060 mg via INTRAVENOUS
  Filled 2014-07-07: qty 42.4

## 2014-07-07 MED ORDER — ACETAMINOPHEN 325 MG PO TABS
650.0000 mg | ORAL_TABLET | Freq: Once | ORAL | Status: AC
  Administered 2014-07-07: 650 mg via ORAL

## 2014-07-07 NOTE — Patient Instructions (Signed)
[facility] Discharge Instructions for Patients Receiving Chemotherapy  Today you received the following immunotherapy , Elotuzumab and a chemotherapy capsule called Revlimid.  To help prevent nausea and vomiting after your treatment, we encourage you to take your nausea medication as prescribed.  If you develop nausea and vomiting that is not controlled by your nausea medication, call the clinic.   BELOW ARE SYMPTOMS THAT SHOULD BE REPORTED IMMEDIATELY:  *FEVER GREATER THAN 100.5 F  *CHILLS WITH OR WITHOUT FEVER  NAUSEA AND VOMITING THAT IS NOT CONTROLLED WITH YOUR NAUSEA MEDICATION  *UNUSUAL SHORTNESS OF BREATH  *UNUSUAL BRUISING OR BLEEDING  TENDERNESS IN MOUTH AND THROAT WITH OR WITHOUT PRESENCE OF ULCERS  *URINARY PROBLEMS  *BOWEL PROBLEMS  UNUSUAL RASH Items with * indicate a potential emergency and should be followed up as soon as possible.  Feel free to call the clinic you have any questions or concerns. The clinic phone number is [phone].

## 2014-07-07 NOTE — Progress Notes (Signed)
   pt swallowed Revlimid capsule 25 mg now

## 2014-07-07 NOTE — Patient Instructions (Signed)

## 2014-07-11 ENCOUNTER — Other Ambulatory Visit (HOSPITAL_BASED_OUTPATIENT_CLINIC_OR_DEPARTMENT_OTHER): Payer: Medicare Other

## 2014-07-11 ENCOUNTER — Encounter: Payer: Self-pay | Admitting: Hematology and Oncology

## 2014-07-11 ENCOUNTER — Telehealth: Payer: Self-pay | Admitting: Hematology and Oncology

## 2014-07-11 ENCOUNTER — Encounter: Payer: Self-pay | Admitting: *Deleted

## 2014-07-11 ENCOUNTER — Other Ambulatory Visit: Payer: Self-pay | Admitting: *Deleted

## 2014-07-11 ENCOUNTER — Ambulatory Visit: Payer: Medicare Other | Admitting: Hematology and Oncology

## 2014-07-11 ENCOUNTER — Telehealth: Payer: Self-pay | Admitting: *Deleted

## 2014-07-11 ENCOUNTER — Other Ambulatory Visit: Payer: Self-pay | Admitting: Hematology and Oncology

## 2014-07-11 ENCOUNTER — Ambulatory Visit (HOSPITAL_BASED_OUTPATIENT_CLINIC_OR_DEPARTMENT_OTHER): Payer: Medicare Other

## 2014-07-11 ENCOUNTER — Ambulatory Visit (HOSPITAL_BASED_OUTPATIENT_CLINIC_OR_DEPARTMENT_OTHER): Payer: Medicare Other | Admitting: Hematology and Oncology

## 2014-07-11 ENCOUNTER — Ambulatory Visit: Payer: Medicare Other

## 2014-07-11 VITALS — BP 121/65 | HR 78 | Temp 97.8°F | Resp 18

## 2014-07-11 VITALS — BP 133/64 | HR 86 | Temp 98.7°F | Resp 18 | Ht 71.0 in | Wt 241.3 lb

## 2014-07-11 DIAGNOSIS — N183 Chronic kidney disease, stage 3 unspecified: Secondary | ICD-10-CM

## 2014-07-11 DIAGNOSIS — E860 Dehydration: Secondary | ICD-10-CM

## 2014-07-11 DIAGNOSIS — T50905A Adverse effect of unspecified drugs, medicaments and biological substances, initial encounter: Secondary | ICD-10-CM

## 2014-07-11 DIAGNOSIS — C9002 Multiple myeloma in relapse: Secondary | ICD-10-CM

## 2014-07-11 DIAGNOSIS — N189 Chronic kidney disease, unspecified: Secondary | ICD-10-CM

## 2014-07-11 DIAGNOSIS — R070 Pain in throat: Secondary | ICD-10-CM

## 2014-07-11 DIAGNOSIS — Z95828 Presence of other vascular implants and grafts: Secondary | ICD-10-CM

## 2014-07-11 DIAGNOSIS — D630 Anemia in neoplastic disease: Secondary | ICD-10-CM

## 2014-07-11 DIAGNOSIS — R112 Nausea with vomiting, unspecified: Secondary | ICD-10-CM

## 2014-07-11 DIAGNOSIS — D6959 Other secondary thrombocytopenia: Secondary | ICD-10-CM

## 2014-07-11 HISTORY — DX: Dehydration: E86.0

## 2014-07-11 LAB — COMPREHENSIVE METABOLIC PANEL (CC13)
ALBUMIN: 3.6 g/dL (ref 3.5–5.0)
ALT: 8 U/L (ref 0–55)
ANION GAP: 9 meq/L (ref 3–11)
AST: 20 U/L (ref 5–34)
Alkaline Phosphatase: 108 U/L (ref 40–150)
BUN: 13.7 mg/dL (ref 7.0–26.0)
CO2: 26 meq/L (ref 22–29)
Calcium: 8.6 mg/dL (ref 8.4–10.4)
Chloride: 106 mEq/L (ref 98–109)
Creatinine: 1.4 mg/dL — ABNORMAL HIGH (ref 0.7–1.3)
GLUCOSE: 126 mg/dL (ref 70–140)
Potassium: 3.5 mEq/L (ref 3.5–5.1)
SODIUM: 141 meq/L (ref 136–145)
TOTAL PROTEIN: 5.7 g/dL — AB (ref 6.4–8.3)
Total Bilirubin: 0.92 mg/dL (ref 0.20–1.20)

## 2014-07-11 LAB — CBC WITH DIFFERENTIAL/PLATELET
BASO%: 1 % (ref 0.0–2.0)
BASOS ABS: 0 10*3/uL (ref 0.0–0.1)
EOS ABS: 0.1 10*3/uL (ref 0.0–0.5)
EOS%: 3.5 % (ref 0.0–7.0)
HEMATOCRIT: 32.5 % — AB (ref 38.4–49.9)
HGB: 11.3 g/dL — ABNORMAL LOW (ref 13.0–17.1)
LYMPH%: 16.2 % (ref 14.0–49.0)
MCH: 33.8 pg — ABNORMAL HIGH (ref 27.2–33.4)
MCHC: 34.8 g/dL (ref 32.0–36.0)
MCV: 97.3 fL (ref 79.3–98.0)
MONO#: 0.6 10*3/uL (ref 0.1–0.9)
MONO%: 15.5 % — AB (ref 0.0–14.0)
NEUT%: 63.8 % (ref 39.0–75.0)
NEUTROS ABS: 2.6 10*3/uL (ref 1.5–6.5)
PLATELETS: 60 10*3/uL — AB (ref 140–400)
RBC: 3.34 10*6/uL — ABNORMAL LOW (ref 4.20–5.82)
RDW: 13.6 % (ref 11.0–14.6)
WBC: 4 10*3/uL (ref 4.0–10.3)
lymph#: 0.7 10*3/uL — ABNORMAL LOW (ref 0.9–3.3)
nRBC: 0 % (ref 0–0)

## 2014-07-11 LAB — TECHNOLOGIST REVIEW

## 2014-07-11 MED ORDER — ONDANSETRON 8 MG/50ML IVPB (CHCC)
8.0000 mg | Freq: Once | INTRAVENOUS | Status: AC
  Administered 2014-07-11: 8 mg via INTRAVENOUS

## 2014-07-11 MED ORDER — HEPARIN SOD (PORK) LOCK FLUSH 100 UNIT/ML IV SOLN
500.0000 [IU] | Freq: Once | INTRAVENOUS | Status: AC
  Administered 2014-07-11: 500 [IU] via INTRAVENOUS
  Filled 2014-07-11: qty 5

## 2014-07-11 MED ORDER — SODIUM CHLORIDE 0.9 % IV SOLN
Freq: Once | INTRAVENOUS | Status: AC
  Administered 2014-07-11: 16:00:00 via INTRAVENOUS

## 2014-07-11 MED ORDER — HYDROMORPHONE HCL PF 4 MG/ML IJ SOLN
2.0000 mg | Freq: Once | INTRAMUSCULAR | Status: AC
  Administered 2014-07-11: 2 mg via INTRAVENOUS

## 2014-07-11 MED ORDER — SODIUM CHLORIDE 0.9 % IJ SOLN
10.0000 mL | INTRAMUSCULAR | Status: DC | PRN
  Administered 2014-07-11: 10 mL via INTRAVENOUS
  Filled 2014-07-11: qty 10

## 2014-07-11 MED ORDER — HYDROMORPHONE HCL PF 4 MG/ML IJ SOLN
INTRAMUSCULAR | Status: AC
  Filled 2014-07-11: qty 1

## 2014-07-11 NOTE — Assessment & Plan Note (Signed)
Clinically, he appears dehydrated and has very poor oral intake. I will proceed to give him a liter of IV fluids daily to see if we can prevent renal injury.

## 2014-07-11 NOTE — Telephone Encounter (Signed)
added appt per pof...pt ok adna ware

## 2014-07-11 NOTE — Assessment & Plan Note (Signed)
The patient has very poor baseline performance status with progressive decline despite salvage chemotherapy last week. I will see him again next week prior to his treatment and if he continues to get worse, I might have to refer him for palliative care and to consider stopping treatment.

## 2014-07-11 NOTE — Patient Instructions (Signed)

## 2014-07-11 NOTE — Assessment & Plan Note (Signed)
The cause is likely due to recent treatment and disease.. It is mild and there is little change compared from previous platelet count. The patient denies recent history of bleeding such as epistaxis, hematuria or hematochezia. He is asymptomatic from the thrombocytopenia. I will observe for now.  he does not require transfusion now.  As long as his platelet count is above 50,000, he will remain on aspirin 81 mg daily to prevent DVT.

## 2014-07-11 NOTE — Assessment & Plan Note (Signed)
This is likely anemia of chronic disease. The patient denies recent history of bleeding such as epistaxis, hematuria or hematochezia. He is asymptomatic from the anemia. We will observe for now.  He does not require transfusion now.   

## 2014-07-11 NOTE — Assessment & Plan Note (Signed)
Kidney function is stable today. I will monitor closely due to high risk of tumor lysis syndrome.

## 2014-07-11 NOTE — Telephone Encounter (Signed)
Patient's wife called to say patient is complaining of sore throat, poor appetite and weak. Has been confused today. Wants to know if this is normal for his condition? No fever, no nausea or vomiting.

## 2014-07-11 NOTE — Progress Notes (Signed)
Pt arrived to Infusion room via w/c.  Assisted to Recl[facility] by two RNs.  Pt c/o sore throat on left side worse w/ swallowing.  States pain in throat and left chest wall and left upper abd is 7/10.  Wife states pt has not taken any pain medication today.   Last took ms contin last night at bedtime. She reports he is not eating or drinking well and very weak.  She states gave pt a "8 hour nausea pill" in the car on the way here for nausea.  Pt denied nausea when he first got to infusion room. Reported pain 7/10 to Dr. Alvy Bimler and she gave verbal order for Dilaudid.  Dilaudid given and pt started vomited aprox 5 minutes later.  Unsure if this related to dilaudid administration.  Vomiting reported to Dr. Alvy Bimler and order for IV Zofran given.  See MAR.  Dr. Alvy Bimler also ordered pt to have IVFs for the rest of this week.   Informed wife we will schedule pt for IVFs the rest of this week.  Instructed her to call Dr. Calton Dach nurse tomorrow morning to find out what time he is scheduled. (Scheduler is gone for today.) POF sent.   Informed wife she can cancel the appt if pt is feeling better tomorrow and able to swallow adequate fluids at home.  She verbalized understanding.    Pt rested w/ eyes closed for most of his visit.  Upon d/c he states his throat pain remains 7/10.  Wife states will give him his Morphine when they get home later.  Pt states nausea relieved.  His VS are stable on d/c.

## 2014-07-11 NOTE — Patient Instructions (Signed)
Dehydration, Adult Dehydration is when you lose more fluids from the body than you take in. Vital organs like the kidneys, brain, and heart cannot function without a proper amount of fluids and salt. Any loss of fluids from the body can cause dehydration.  CAUSES   Vomiting.  Diarrhea.  Excessive sweating.  Excessive urine output.  Fever. SYMPTOMS  Mild dehydration  Thirst.  Dry lips.  Slightly dry mouth. Moderate dehydration  Very dry mouth.  Sunken eyes.  Skin does not bounce back quickly when lightly pinched and released.  Dark urine and decreased urine production.  Decreased tear production.  Headache. Severe dehydration  Very dry mouth.  Extreme thirst.  Rapid, weak pulse (more than 100 beats per minute at rest).  Cold hands and feet.  Not able to sweat in spite of heat and temperature.  Rapid breathing.  Blue lips.  Confusion and lethargy.  Difficulty being awakened.  Minimal urine production.  No tears. DIAGNOSIS  Your caregiver will diagnose dehydration based on your symptoms and your exam. Blood and urine tests will help confirm the diagnosis. The diagnostic evaluation should also identify the cause of dehydration. TREATMENT  Treatment of mild or moderate dehydration can often be done at home by increasing the amount of fluids that you drink. It is best to drink small amounts of fluid more often. Drinking too much at one time can make vomiting worse. Refer to the home care instructions below. Severe dehydration needs to be treated at the hospital where you will probably be given intravenous (IV) fluids that contain water and electrolytes. HOME CARE INSTRUCTIONS   Ask your caregiver about specific rehydration instructions.  Drink enough fluids to keep your urine clear or pale yellow.  Drink small amounts frequently if you have nausea and vomiting.  Eat as you normally do.  Avoid:  Foods or drinks high in sugar.  Carbonated  drinks.  Juice.  Extremely hot or cold fluids.  Drinks with caffeine.  Fatty, greasy foods.  Alcohol.  Tobacco.  Overeating.  Gelatin desserts.  Wash your hands well to avoid spreading bacteria and viruses.  Only take over-the-counter or prescription medicines for pain, discomfort, or fever as directed by your caregiver.  Ask your caregiver if you should continue all prescribed and over-the-counter medicines.  Keep all follow-up appointments with your caregiver. SEEK MEDICAL CARE IF:  You have abdominal pain and it increases or stays in one area (localizes).  You have a rash, stiff neck, or severe headache.  You are irritable, sleepy, or difficult to awaken.  You are weak, dizzy, or extremely thirsty. SEEK IMMEDIATE MEDICAL CARE IF:   You are unable to keep fluids down or you get worse despite treatment.  You have frequent episodes of vomiting or diarrhea.  You have blood or green matter (bile) in your vomit.  You have blood in your stool or your stool looks black and tarry.  You have not urinated in 6 to 8 hours, or you have only urinated a small amount of very dark urine.  You have a fever.  You faint. MAKE SURE YOU:   Understand these instructions.  Will watch your condition.  Will get help right away if you are not doing well or get worse. Document Released: 12/08/2005 Document Revised: 03/01/2012 Document Reviewed: 07/28/2011 ExitCare Patient Information 2015 [employer]. This information is not intended to replace advice given to you by your health care provider. Make sure you discuss any questions you have with your health care   provider.  

## 2014-07-11 NOTE — Progress Notes (Signed)
[facility] OFFICE PROGRESS NOTE  Patient Care Team: Kandice Hams, MD as PCP - General (Internal Medicine) Annia Belt, MD as Consulting Physician (Internal Medicine) Heath Lark, MD as Consulting Physician (Hematology and Oncology)  SUMMARY OF ONCOLOGIC HISTORY:  He was initially diagnosed in June 2010. He had a heavy myeloma burden at time of diagnosis with multiple lytic bone lesions, 87% plasma cells in the bone marrow, and 6.7 g of protein in the urine. 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 He went on to receive high-dose IV melphalan with autologous stem cell support at [facility] 03/12/2010. We began to see small amounts of monoclonal lambda free light chains again in his urine in March of 2012. He was started on Revlimid 10 mg daily beginning in April 2012. Dose had to be adjusted due to myelosuppression down to 5 mg. At time of a visit here in December 2013, he was complaining of recurrent pain in his shoulders, bilateral ribs, and low back. Lab showed rise in his serum and urine paraprotein levels. He was restaged with a bone marrow biopsy on 12/31/2012 which showed 10% plasma cells, rise in total urine protein from 260 mg to 1039 mg, and rise in serum free lambda light chains from 4.8 mg percent in July 2013,to 10 mg percent on October 29, to a 25.8 mg percent by December 16, to 58.5 mg percent by 01/07/2013. He was then started him on pomalidomide on 01/28/2013. Initial dose 3 mg 21 days on 7 days rest.  He tolerated the drug well. Unfortunately, at time of his visit here on August 2014 he appeared to be breaking through the pomalidomide. Although his hematologic profile remained stable, there was a progressive rise in the lambda free light chains up to 116 mg percent as of 07/26/2013 compared with 58 on May 28. 24 hour urine total protein back up to 6645 mg. After a lengthy discussion  with respect to treatment options, he was started on trial of Bendamustine plus prednisone. This was started on September 4. He had 6 cycles through  02/01/2014. He appeared to have a initial improvement with fall in his serum lambda free light chains from 116 mg percent down to 91 by November 2014, fall in 24 hour urine protein from 6.6 g down to 5.4, fall in his creatinine from 1.4 down to 1.1. Unfortunately, recent reevaluation done on February 01, 2014 shows rise in the serum light chains up to 261 mg percent, urine 24-hour protein up to 14.4 g, and creatinine up to 1.3.  On 02/28/2014, he was started on a trial of kyprolis. Repeat serum lambda light chain on 04/10/2014 showed that the patient is responding to treatment. On 06/20/2014, repeat serum light chain is getting worse and treatment was discontinued. The patient is enrolled in clinical trial and will begin first dose of chemotherapy using combination dexamethasone, Revlimid and Elotuzumab on 07/07/2014.  INTERVAL HISTORY: Please see below for problem oriented charting. His wife contacted me today for urgent evaluation due to profound weakness and poor oral intake. The patient also appears to be confused this morning. There were no signs and symptoms of infection. He denies any recent fever, chills, night sweats or abnormal weight loss He denies new cough. He has poor appetite and poor oral intake. He denies nausea or vomiting. His blood sugar this morning was less than 150.    REVIEW OF  SYSTEMS:   Constitutional: Denies fevers, chills or abnormal weight loss Eyes: Denies blurriness of vision Ears, nose, mouth, throat, and face: Denies mucositis or sore throat Respiratory: Denies cough, dyspnea or wheezes Cardiovascular: Denies palpitation, chest discomfort or lower extremity swelling Gastrointestinal:  Denies nausea, heartburn or change in bowel habits Skin: Denies abnormal skin rashes Lymphatics: Denies new lymphadenopathy or  easy bruising Neurological:Denies numbness, tingling or new weaknesses  All other systems were reviewed with the patient and are negative.  I have reviewed the past medical history, past surgical history, social history and family history with the patient and they are unchanged from previous note.  ALLERGIES:  has No Known Allergies.  MEDICATIONS:  Current Outpatient Prescriptions  Medication Sig Dispense Refill  . acetaminophen (TYLENOL) 500 MG tablet Take 500 mg by mouth every 6 (six) hours as needed. For pain.      Marland Kitchen aspirin EC 81 MG tablet Take 81 mg by mouth daily after breakfast.       . atorvastatin (LIPITOR) 10 MG tablet Take 10 mg by mouth at bedtime.        . BD ULTRA-FINE PEN NEEDLES 29G X 12.7MM MISC Inject 30 Units into the skin daily.       . carbidopa-levodopa (SINEMET IR) 25-100 MG per tablet TAKE 1 TABLET 3 TIMES A DAY  90 tablet  6  . dexamethasone (DECADRON) 4 MG tablet Take 7 tablets (28 mg total) by mouth once a week.  100 tablet  1  . furosemide (LASIX) 40 MG tablet Take 1 tablet (40 mg total) by mouth daily.  30 tablet  11  . Insulin Glargine (LANTUS SOLOSTAR Impact) Inject 20-30 Units into the skin every evening.       Marland Kitchen lenalidomide (REVLIMID) 25 MG capsule Take 1 capsule (25 mg total) by mouth daily.  21 capsule  0  . lidocaine-prilocaine (EMLA) cream Apply 1 application topically as needed. Apply to port at least 1 hour before procedure & cover.  30 g  1  . morphine (MS CONTIN) 30 MG 12 hr tablet Take 1 tablet (30 mg total) by mouth every 12 (twelve) hours.  60 tablet  0  . morphine (MSIR) 15 MG tablet Take 1 tablet (15 mg total) by mouth every 4 (four) hours as needed for severe pain.  90 tablet  0  . nystatin cream (MYCOSTATIN)       . polyethylene glycol (MIRALAX / GLYCOLAX) packet Take 17 g by mouth daily after breakfast.       . pregabalin (LYRICA) 100 MG capsule Take 1 capsule (100 mg total) by mouth 2 (two) times daily.  60 capsule  1  . rasagiline (AZILECT) 1  MG TABS tablet Take 1 tablet (1 mg total) by mouth daily.  30 tablet  6  . valACYclovir (VALTREX) 500 MG tablet Take 1 tablet (500 mg total) by mouth daily. Takes once daily.  90 tablet  3   No current facility-administered medications for this visit.   Facility-Administered Medications Ordered in Other Visits  Medication Dose Route Frequency Provider Last Rate Last Dose  . sodium chloride 0.9 % injection 10 mL  10 mL Intravenous PRN Heath Lark, MD   10 mL at 05/09/14 1045  . sodium chloride 0.9 % injection 10 mL  10 mL Intravenous PRN Heath Lark, MD   10 mL at 07/11/14 1757    PHYSICAL EXAMINATION: ECOG PERFORMANCE STATUS: 2 - Symptomatic, <50% confined to bed  Filed Vitals:   07/11/14  1549  BP: 133/64  Pulse: 86  Temp: 98.7 F (37.1 C)  Resp: 18   Filed Weights   07/11/14 1549  Weight: 241 lb 4.8 oz (109.453 kg)    GENERAL:alert, no distress and comfortable SKIN: skin color, texture, turgor are normal, no rashes or significant lesions EYES: normal, Conjunctiva are pink and non-injected, sclera clear OROPHARYNX:no exudate, no erythema and lips, buccal mucosa, and tongue normal. His mucous membranes appear dry NECK: supple, thyroid normal size, non-tender, without nodularity LYMPH:  no palpable lymphadenopathy in the cervical, axillary or inguinal LUNGS: clear to auscultation and percussion with normal breathing effort HEART: regular rate & rhythm and no murmurs with moderate bilateral lower extremity edema ABDOMEN:abdomen soft, non-tender and normal bowel sounds Musculoskeletal:no cyanosis of digits and no clubbing  NEURO: alert & oriented x 3 with fluent speech  LABORATORY DATA:  I have reviewed the data as listed    Component Value Date/Time   NA 141 07/11/2014 1526   NA 138 12/16/2013 1350   NA 145 06/13/2009 1503   K 3.5 07/11/2014 1526   K 3.6 12/16/2013 1350   K 4.8* 06/13/2009 1503   CL 104 12/16/2013 1350   CL 104 05/18/2013 1113   CL 108 06/13/2009 1503   CO2  26 07/11/2014 1526   CO2 24 12/16/2013 1350   CO2 29 06/13/2009 1503   GLUCOSE 126 07/11/2014 1526   GLUCOSE 192* 12/16/2013 1350   GLUCOSE 325* 05/18/2013 1113   GLUCOSE 114 06/13/2009 1503   BUN 13.7 07/11/2014 1526   BUN 10 12/16/2013 1350   BUN 49* 06/13/2009 1503   CREATININE 1.4* 07/11/2014 1526   CREATININE 1.28 12/16/2013 1350   CREATININE 1.33 07/22/2012 1334   CREATININE 2.9* 06/13/2009 1503   CALCIUM 8.6 07/11/2014 1526   CALCIUM 7.7* 12/16/2013 1350   CALCIUM 10.5* 06/13/2009 1503   PROT 5.7* 07/11/2014 1526   PROT 5.1* 12/16/2013 1350   PROT 6.4 06/13/2009 1503   ALBUMIN 3.6 07/11/2014 1526   ALBUMIN 2.9* 12/16/2013 1350   AST 20 07/11/2014 1526   AST 26 12/16/2013 1350   AST 21 06/13/2009 1503   ALT 8 07/11/2014 1526   ALT <5 12/16/2013 1350   ALT 25 06/13/2009 1503   ALKPHOS 108 07/11/2014 1526   ALKPHOS 109 12/16/2013 1350   ALKPHOS 135* 06/13/2009 1503   BILITOT 0.92 07/11/2014 1526   BILITOT 0.5 12/16/2013 1350   BILITOT 0.60 06/13/2009 1503   GFRNONAA 54* 12/16/2013 1350   GFRAA 62* 12/16/2013 1350    No results found for this basename: SPEP,  UPEP,   kappa and lambda light chains    Lab Results  Component Value Date   WBC 4.0 07/11/2014   NEUTROABS 2.6 07/11/2014   HGB 11.3* 07/11/2014   HCT 32.5* 07/11/2014   MCV 97.3 07/11/2014   PLT 60* 07/11/2014      Chemistry      Component Value Date/Time   NA 141 07/11/2014 1526   NA 138 12/16/2013 1350   NA 145 06/13/2009 1503   K 3.5 07/11/2014 1526   K 3.6 12/16/2013 1350   K 4.8* 06/13/2009 1503   CL 104 12/16/2013 1350   CL 104 05/18/2013 1113   CL 108 06/13/2009 1503   CO2 26 07/11/2014 1526   CO2 24 12/16/2013 1350   CO2 29 06/13/2009 1503   BUN 13.7 07/11/2014 1526   BUN 10 12/16/2013 1350   BUN 49* 06/13/2009 1503   CREATININE 1.4* 07/11/2014 1526  CREATININE 1.28 12/16/2013 1350   CREATININE 1.33 07/22/2012 1334   CREATININE 2.9* 06/13/2009 1503      Component Value Date/Time   CALCIUM 8.6 07/11/2014 1526    CALCIUM 7.7* 12/16/2013 1350   CALCIUM 10.5* 06/13/2009 1503   ALKPHOS 108 07/11/2014 1526   ALKPHOS 109 12/16/2013 1350   ALKPHOS 135* 06/13/2009 1503   AST 20 07/11/2014 1526   AST 26 12/16/2013 1350   AST 21 06/13/2009 1503   ALT 8 07/11/2014 1526   ALT <5 12/16/2013 1350   ALT 25 06/13/2009 1503   BILITOT 0.92 07/11/2014 1526   BILITOT 0.5 12/16/2013 1350   BILITOT 0.60 06/13/2009 1503     ASSESSMENT & PLAN:  Multiple myeloma in relapse The patient has very poor baseline performance status with progressive decline despite salvage chemotherapy last week. I will see him again next week prior to his treatment and if he continues to get worse, I might have to refer him for palliative care and to consider stopping treatment.  Thrombocytopenia due to drugs The cause is likely due to recent treatment and disease.. It is mild and there is little change compared from previous platelet count. The patient denies recent history of bleeding such as epistaxis, hematuria or hematochezia. He is asymptomatic from the thrombocytopenia. I will observe for now.  he does not require transfusion now.  As long as his platelet count is above 50,000, he will remain on aspirin 81 mg daily to prevent DVT.       Dehydration Clinically, he appears dehydrated and has very poor oral intake. I will proceed to give him a liter of IV fluids daily to see if we can prevent renal injury.  CRI (chronic renal insufficiency) Kidney function is stable today. I will monitor closely due to high risk of tumor lysis syndrome.  Anemia in neoplastic disease This is likely anemia of chronic disease. The patient denies recent history of bleeding such as epistaxis, hematuria or hematochezia. He is asymptomatic from the anemia. We will observe for now.  He does not require transfusion now.          All questions were answered. The patient knows to call the clinic with any problems, questions or concerns. No barriers to  learning was detected. I spent 25 minutes counseling the patient face to face. The total time spent in the appointment was 30 minutes and more than 50% was on counseling and review of test results     [facility], [place], MD 07/11/2014 8:56 PM

## 2014-07-12 ENCOUNTER — Telehealth: Payer: Self-pay | Admitting: Hematology and Oncology

## 2014-07-12 ENCOUNTER — Ambulatory Visit (HOSPITAL_BASED_OUTPATIENT_CLINIC_OR_DEPARTMENT_OTHER): Payer: Medicare Other

## 2014-07-12 VITALS — BP 131/65 | HR 79 | Temp 97.1°F | Resp 18

## 2014-07-12 DIAGNOSIS — E860 Dehydration: Secondary | ICD-10-CM

## 2014-07-12 DIAGNOSIS — C9002 Multiple myeloma in relapse: Secondary | ICD-10-CM

## 2014-07-12 MED ORDER — SODIUM CHLORIDE 0.9 % IJ SOLN
10.0000 mL | INTRAMUSCULAR | Status: DC | PRN
  Administered 2014-07-12: 10 mL
  Filled 2014-07-12: qty 10

## 2014-07-12 MED ORDER — SODIUM CHLORIDE 0.9 % IV SOLN
Freq: Once | INTRAVENOUS | Status: AC
  Administered 2014-07-12: 16:00:00 via INTRAVENOUS

## 2014-07-12 MED ORDER — HEPARIN SOD (PORK) LOCK FLUSH 100 UNIT/ML IV SOLN
500.0000 [IU] | Freq: Once | INTRAVENOUS | Status: AC | PRN
  Administered 2014-07-12: 500 [IU]
  Filled 2014-07-12: qty 5

## 2014-07-12 NOTE — Progress Notes (Signed)
Patient had an episode at home early this morning where he was trying to push himself up out of bed but was too weak and ended up on the floor. He was unharmed and remained conscious throughout. Pt's wife unable to help him up because he is "dead weight" so she had to call other family member. Pt states he landed on R side/chest and port site. Port with mild erythema but not tender, IVF infusing without complications. Pt and wife are interested in some form of home health assistance, particularly a device that would provide rails for commode, he states "seat is too low." Dr. Alvy Bimler notified of the above via inbasket message.

## 2014-07-12 NOTE — Telephone Encounter (Signed)
s.w. pt and advised on July appt....pt ok and aware °

## 2014-07-12 NOTE — Patient Instructions (Signed)
Dehydration, Adult Dehydration is when you lose more fluids from the body than you take in. Vital organs like the kidneys, brain, and heart cannot function without a proper amount of fluids and salt. Any loss of fluids from the body can cause dehydration.  CAUSES   Vomiting.  Diarrhea.  Excessive sweating.  Excessive urine output.  Fever. SYMPTOMS  Mild dehydration  Thirst.  Dry lips.  Slightly dry mouth. Moderate dehydration  Very dry mouth.  Sunken eyes.  Skin does not bounce back quickly when lightly pinched and released.  Dark urine and decreased urine production.  Decreased tear production.  Headache. Severe dehydration  Very dry mouth.  Extreme thirst.  Rapid, weak pulse (more than 100 beats per minute at rest).  Cold hands and feet.  Not able to sweat in spite of heat and temperature.  Rapid breathing.  Blue lips.  Confusion and lethargy.  Difficulty being awakened.  Minimal urine production.  No tears. DIAGNOSIS  Your caregiver will diagnose dehydration based on your symptoms and your exam. Blood and urine tests will help confirm the diagnosis. The diagnostic evaluation should also identify the cause of dehydration. TREATMENT  Treatment of mild or moderate dehydration can often be done at home by increasing the amount of fluids that you drink. It is best to drink small amounts of fluid more often. Drinking too much at one time can make vomiting worse. Refer to the home care instructions below. Severe dehydration needs to be treated at the hospital where you will probably be given intravenous (IV) fluids that contain water and electrolytes. HOME CARE INSTRUCTIONS   Ask your caregiver about specific rehydration instructions.  Drink enough fluids to keep your urine clear or pale yellow.  Drink small amounts frequently if you have nausea and vomiting.  Eat as you normally do.  Avoid:  Foods or drinks high in sugar.  Carbonated  drinks.  Juice.  Extremely hot or cold fluids.  Drinks with caffeine.  Fatty, greasy foods.  Alcohol.  Tobacco.  Overeating.  Gelatin desserts.  Wash your hands well to avoid spreading bacteria and viruses.  Only take over-the-counter or prescription medicines for pain, discomfort, or fever as directed by your caregiver.  Ask your caregiver if you should continue all prescribed and over-the-counter medicines.  Keep all follow-up appointments with your caregiver. SEEK MEDICAL CARE IF:  You have abdominal pain and it increases or stays in one area (localizes).  You have a rash, stiff neck, or severe headache.  You are irritable, sleepy, or difficult to awaken.  You are weak, dizzy, or extremely thirsty. SEEK IMMEDIATE MEDICAL CARE IF:   You are unable to keep fluids down or you get worse despite treatment.  You have frequent episodes of vomiting or diarrhea.  You have blood or green matter (bile) in your vomit.  You have blood in your stool or your stool looks black and tarry.  You have not urinated in 6 to 8 hours, or you have only urinated a small amount of very dark urine.  You have a fever.  You faint. MAKE SURE YOU:   Understand these instructions.  Will watch your condition.  Will get help right away if you are not doing well or get worse. Document Released: 12/08/2005 Document Revised: 03/01/2012 Document Reviewed: 07/28/2011 ExitCare Patient Information 2015 [employer]. This information is not intended to replace advice given to you by your health care provider. Make sure you discuss any questions you have with your health care   provider.  

## 2014-07-13 ENCOUNTER — Other Ambulatory Visit: Payer: Self-pay | Admitting: *Deleted

## 2014-07-13 ENCOUNTER — Ambulatory Visit (HOSPITAL_BASED_OUTPATIENT_CLINIC_OR_DEPARTMENT_OTHER): Payer: Medicare Other

## 2014-07-13 VITALS — BP 144/61 | HR 78 | Temp 98.3°F | Resp 18

## 2014-07-13 DIAGNOSIS — C9002 Multiple myeloma in relapse: Secondary | ICD-10-CM

## 2014-07-13 DIAGNOSIS — G20 Parkinson's disease: Secondary | ICD-10-CM

## 2014-07-13 DIAGNOSIS — E860 Dehydration: Secondary | ICD-10-CM

## 2014-07-13 DIAGNOSIS — E1140 Type 2 diabetes mellitus with diabetic neuropathy, unspecified: Secondary | ICD-10-CM

## 2014-07-13 LAB — CBC WITH DIFFERENTIAL/PLATELET
BASO%: 0.4 % (ref 0.0–2.0)
Basophils Absolute: 0 10*3/uL (ref 0.0–0.1)
EOS%: 3.7 % (ref 0.0–7.0)
Eosinophils Absolute: 0.1 10*3/uL (ref 0.0–0.5)
HEMATOCRIT: 30.8 % — AB (ref 38.4–49.9)
HGB: 10.5 g/dL — ABNORMAL LOW (ref 13.0–17.1)
LYMPH%: 11.6 % — AB (ref 14.0–49.0)
MCH: 34.1 pg — AB (ref 27.2–33.4)
MCHC: 34.1 g/dL (ref 32.0–36.0)
MCV: 100.2 fL — ABNORMAL HIGH (ref 79.3–98.0)
MONO#: 0.5 10*3/uL (ref 0.1–0.9)
MONO%: 15 % — ABNORMAL HIGH (ref 0.0–14.0)
NEUT#: 2.2 10*3/uL (ref 1.5–6.5)
NEUT%: 69.3 % (ref 39.0–75.0)
Platelets: 58 10*3/uL — ABNORMAL LOW (ref 140–400)
RBC: 3.07 10*6/uL — ABNORMAL LOW (ref 4.20–5.82)
RDW: 13.6 % (ref 11.0–14.6)
WBC: 3.2 10*3/uL — AB (ref 4.0–10.3)
lymph#: 0.4 10*3/uL — ABNORMAL LOW (ref 0.9–3.3)

## 2014-07-13 LAB — COMPREHENSIVE METABOLIC PANEL (CC13)
ALT: 10 U/L (ref 0–55)
ANION GAP: 6 meq/L (ref 3–11)
AST: 20 U/L (ref 5–34)
Albumin: 3.2 g/dL — ABNORMAL LOW (ref 3.5–5.0)
Alkaline Phosphatase: 108 U/L (ref 40–150)
BILIRUBIN TOTAL: 0.94 mg/dL (ref 0.20–1.20)
BUN: 10.8 mg/dL (ref 7.0–26.0)
CO2: 27 meq/L (ref 22–29)
CREATININE: 1.5 mg/dL — AB (ref 0.7–1.3)
Calcium: 8.3 mg/dL — ABNORMAL LOW (ref 8.4–10.4)
Chloride: 109 mEq/L (ref 98–109)
Glucose: 173 mg/dl — ABNORMAL HIGH (ref 70–140)
Potassium: 3.6 mEq/L (ref 3.5–5.1)
Sodium: 142 mEq/L (ref 136–145)
Total Protein: 5.2 g/dL — ABNORMAL LOW (ref 6.4–8.3)

## 2014-07-13 LAB — TECHNOLOGIST REVIEW

## 2014-07-13 MED ORDER — SODIUM CHLORIDE 0.9 % IJ SOLN
10.0000 mL | INTRAMUSCULAR | Status: DC | PRN
  Administered 2014-07-13: 10 mL
  Filled 2014-07-13: qty 10

## 2014-07-13 MED ORDER — SODIUM CHLORIDE 0.9 % IV SOLN
Freq: Once | INTRAVENOUS | Status: AC
  Administered 2014-07-13: 16:00:00 via INTRAVENOUS

## 2014-07-13 MED ORDER — HEPARIN SOD (PORK) LOCK FLUSH 100 UNIT/ML IV SOLN
500.0000 [IU] | Freq: Once | INTRAVENOUS | Status: AC | PRN
  Administered 2014-07-13: 500 [IU]
  Filled 2014-07-13: qty 5

## 2014-07-13 NOTE — Progress Notes (Signed)
Informed [name] [name] w/ [facility] of new order for home care.

## 2014-07-13 NOTE — Patient Instructions (Signed)

## 2014-07-14 ENCOUNTER — Other Ambulatory Visit: Payer: Self-pay | Admitting: Oncology

## 2014-07-14 ENCOUNTER — Other Ambulatory Visit: Payer: Medicare Other

## 2014-07-14 ENCOUNTER — Ambulatory Visit (HOSPITAL_BASED_OUTPATIENT_CLINIC_OR_DEPARTMENT_OTHER): Payer: Medicare Other

## 2014-07-14 VITALS — BP 133/59 | HR 85 | Temp 98.0°F | Resp 20

## 2014-07-14 DIAGNOSIS — B029 Zoster without complications: Secondary | ICD-10-CM

## 2014-07-14 DIAGNOSIS — C9002 Multiple myeloma in relapse: Secondary | ICD-10-CM

## 2014-07-14 DIAGNOSIS — IMO0002 Reserved for concepts with insufficient information to code with codable children: Secondary | ICD-10-CM

## 2014-07-14 DIAGNOSIS — N182 Chronic kidney disease, stage 2 (mild): Secondary | ICD-10-CM

## 2014-07-14 DIAGNOSIS — Z5112 Encounter for antineoplastic immunotherapy: Secondary | ICD-10-CM

## 2014-07-14 MED ORDER — SODIUM CHLORIDE 0.9 % IJ SOLN
10.0000 mL | INTRAMUSCULAR | Status: DC | PRN
  Administered 2014-07-14: 10 mL
  Filled 2014-07-14: qty 10

## 2014-07-14 MED ORDER — DEXAMETHASONE SODIUM PHOSPHATE 10 MG/ML IJ SOLN
INTRAMUSCULAR | Status: AC
  Filled 2014-07-14: qty 1

## 2014-07-14 MED ORDER — DIPHENHYDRAMINE HCL 25 MG PO CAPS
ORAL_CAPSULE | ORAL | Status: AC
  Filled 2014-07-14: qty 2

## 2014-07-14 MED ORDER — FAMOTIDINE IN NACL 20-0.9 MG/50ML-% IV SOLN
20.0000 mg | Freq: Once | INTRAVENOUS | Status: AC
  Administered 2014-07-14: 20 mg via INTRAVENOUS

## 2014-07-14 MED ORDER — ACETAMINOPHEN 325 MG PO TABS
ORAL_TABLET | ORAL | Status: AC
  Filled 2014-07-14: qty 2

## 2014-07-14 MED ORDER — SODIUM CHLORIDE 0.9 % IV SOLN
10.0000 mg/kg | Freq: Once | INTRAVENOUS | Status: AC
  Administered 2014-07-14: 1060 mg via INTRAVENOUS
  Filled 2014-07-14: qty 42.4

## 2014-07-14 MED ORDER — DIPHENHYDRAMINE HCL 25 MG PO CAPS
50.0000 mg | ORAL_CAPSULE | Freq: Once | ORAL | Status: AC
  Administered 2014-07-14: 50 mg via ORAL

## 2014-07-14 MED ORDER — SODIUM CHLORIDE 0.9 % IV SOLN
Freq: Once | INTRAVENOUS | Status: AC
  Administered 2014-07-14: 10:00:00 via INTRAVENOUS

## 2014-07-14 MED ORDER — ACETAMINOPHEN 325 MG PO TABS
650.0000 mg | ORAL_TABLET | Freq: Once | ORAL | Status: AC
  Administered 2014-07-14: 650 mg via ORAL

## 2014-07-14 MED ORDER — FAMOTIDINE IN NACL 20-0.9 MG/50ML-% IV SOLN
INTRAVENOUS | Status: AC
  Filled 2014-07-14: qty 50

## 2014-07-14 MED ORDER — DEXAMETHASONE SODIUM PHOSPHATE 10 MG/ML IJ SOLN
8.0000 mg | Freq: Once | INTRAMUSCULAR | Status: AC
  Administered 2014-07-14: 8 mg via INTRAVENOUS

## 2014-07-14 MED ORDER — HEPARIN SOD (PORK) LOCK FLUSH 100 UNIT/ML IV SOLN
500.0000 [IU] | Freq: Once | INTRAVENOUS | Status: AC | PRN
  Administered 2014-07-14: 500 [IU]
  Filled 2014-07-14: qty 5

## 2014-07-17 ENCOUNTER — Other Ambulatory Visit: Payer: Self-pay | Admitting: *Deleted

## 2014-07-17 ENCOUNTER — Ambulatory Visit: Payer: Medicare Other

## 2014-07-18 ENCOUNTER — Ambulatory Visit: Payer: Medicare Other

## 2014-07-18 ENCOUNTER — Telehealth: Payer: Self-pay | Admitting: *Deleted

## 2014-07-18 ENCOUNTER — Encounter: Payer: Self-pay | Admitting: Hematology and Oncology

## 2014-07-18 NOTE — Telephone Encounter (Signed)
Wife states patient is eating and drinking without problems. Will cancel IVF for Wednesday

## 2014-07-18 NOTE — Progress Notes (Signed)
Received letter from [employer].  Pt is approved for Revlimid from 07/03/14 to 07/03/15 or when benefit cap has been met.  Expenses can be submitted for reimbursement for dos 04/04/14 to 07/03/15.  The amount of the grant is $10,000.

## 2014-07-19 ENCOUNTER — Ambulatory Visit: Payer: Medicare Other

## 2014-07-20 ENCOUNTER — Ambulatory Visit: Payer: Medicare Other

## 2014-07-20 ENCOUNTER — Telehealth: Payer: Self-pay | Admitting: *Deleted

## 2014-07-20 NOTE — Telephone Encounter (Signed)
5 tabs

## 2014-07-20 NOTE — Telephone Encounter (Signed)
Instructed wife pt to take 5 tablets of dexamethasone tomorrow morning.  She verbalized understanding.  She wants to cancel his appt this afternoon for IVFs states he is drinking and eating well.  She confirmed his appt time for tomorrow morning.

## 2014-07-20 NOTE — Telephone Encounter (Signed)
Wife asks if pt is still supposed to take his "steroid pills" tomorrow and if he is to take Five tablets?  States he is a little weaker but is able to eat and drink well.

## 2014-07-21 ENCOUNTER — Telehealth: Payer: Self-pay | Admitting: Hematology and Oncology

## 2014-07-21 ENCOUNTER — Ambulatory Visit (HOSPITAL_BASED_OUTPATIENT_CLINIC_OR_DEPARTMENT_OTHER): Payer: Medicare Other

## 2014-07-21 ENCOUNTER — Other Ambulatory Visit (HOSPITAL_BASED_OUTPATIENT_CLINIC_OR_DEPARTMENT_OTHER): Payer: Medicare Other

## 2014-07-21 ENCOUNTER — Ambulatory Visit (HOSPITAL_BASED_OUTPATIENT_CLINIC_OR_DEPARTMENT_OTHER): Payer: Medicare Other | Admitting: Hematology and Oncology

## 2014-07-21 ENCOUNTER — Ambulatory Visit: Payer: Medicare Other

## 2014-07-21 ENCOUNTER — Encounter: Payer: Self-pay | Admitting: Hematology and Oncology

## 2014-07-21 ENCOUNTER — Other Ambulatory Visit: Payer: Self-pay | Admitting: Hematology and Oncology

## 2014-07-21 VITALS — BP 129/59 | HR 69 | Temp 98.5°F | Resp 20 | Ht 71.0 in | Wt 233.7 lb

## 2014-07-21 DIAGNOSIS — T50905A Adverse effect of unspecified drugs, medicaments and biological substances, initial encounter: Secondary | ICD-10-CM

## 2014-07-21 DIAGNOSIS — E1149 Type 2 diabetes mellitus with other diabetic neurological complication: Secondary | ICD-10-CM

## 2014-07-21 DIAGNOSIS — G620 Drug-induced polyneuropathy: Secondary | ICD-10-CM

## 2014-07-21 DIAGNOSIS — N183 Chronic kidney disease, stage 3 unspecified: Secondary | ICD-10-CM

## 2014-07-21 DIAGNOSIS — R600 Localized edema: Secondary | ICD-10-CM

## 2014-07-21 DIAGNOSIS — D630 Anemia in neoplastic disease: Secondary | ICD-10-CM

## 2014-07-21 DIAGNOSIS — T451X5A Adverse effect of antineoplastic and immunosuppressive drugs, initial encounter: Principal | ICD-10-CM

## 2014-07-21 DIAGNOSIS — Z95828 Presence of other vascular implants and grafts: Secondary | ICD-10-CM

## 2014-07-21 DIAGNOSIS — B029 Zoster without complications: Secondary | ICD-10-CM

## 2014-07-21 DIAGNOSIS — C9002 Multiple myeloma in relapse: Secondary | ICD-10-CM

## 2014-07-21 DIAGNOSIS — I82401 Acute embolism and thrombosis of unspecified deep veins of right lower extremity: Secondary | ICD-10-CM

## 2014-07-21 DIAGNOSIS — N182 Chronic kidney disease, stage 2 (mild): Secondary | ICD-10-CM

## 2014-07-21 DIAGNOSIS — E1140 Type 2 diabetes mellitus with diabetic neuropathy, unspecified: Secondary | ICD-10-CM

## 2014-07-21 DIAGNOSIS — Z5112 Encounter for antineoplastic immunotherapy: Secondary | ICD-10-CM

## 2014-07-21 DIAGNOSIS — I82409 Acute embolism and thrombosis of unspecified deep veins of unspecified lower extremity: Secondary | ICD-10-CM

## 2014-07-21 DIAGNOSIS — N189 Chronic kidney disease, unspecified: Secondary | ICD-10-CM

## 2014-07-21 DIAGNOSIS — IMO0002 Reserved for concepts with insufficient information to code with codable children: Secondary | ICD-10-CM

## 2014-07-21 DIAGNOSIS — D6959 Other secondary thrombocytopenia: Secondary | ICD-10-CM

## 2014-07-21 DIAGNOSIS — E1142 Type 2 diabetes mellitus with diabetic polyneuropathy: Secondary | ICD-10-CM

## 2014-07-21 DIAGNOSIS — R609 Edema, unspecified: Secondary | ICD-10-CM

## 2014-07-21 DIAGNOSIS — I10 Essential (primary) hypertension: Secondary | ICD-10-CM

## 2014-07-21 LAB — COMPREHENSIVE METABOLIC PANEL (CC13)
AST: 13 U/L (ref 5–34)
Albumin: 3.3 g/dL — ABNORMAL LOW (ref 3.5–5.0)
Alkaline Phosphatase: 87 U/L (ref 40–150)
Anion Gap: 10 mEq/L (ref 3–11)
BILIRUBIN TOTAL: 0.86 mg/dL (ref 0.20–1.20)
BUN: 13 mg/dL (ref 7.0–26.0)
CALCIUM: 8.6 mg/dL (ref 8.4–10.4)
CHLORIDE: 106 meq/L (ref 98–109)
CO2: 26 meq/L (ref 22–29)
Creatinine: 1.6 mg/dL — ABNORMAL HIGH (ref 0.7–1.3)
Glucose: 194 mg/dl — ABNORMAL HIGH (ref 70–140)
Potassium: 3.9 mEq/L (ref 3.5–5.1)
SODIUM: 142 meq/L (ref 136–145)
Total Protein: 5.4 g/dL — ABNORMAL LOW (ref 6.4–8.3)

## 2014-07-21 LAB — CBC WITH DIFFERENTIAL/PLATELET
BASO%: 0.5 % (ref 0.0–2.0)
Basophils Absolute: 0 10*3/uL (ref 0.0–0.1)
EOS%: 4.7 % (ref 0.0–7.0)
Eosinophils Absolute: 0.1 10*3/uL (ref 0.0–0.5)
HCT: 31.8 % — ABNORMAL LOW (ref 38.4–49.9)
HGB: 10.9 g/dL — ABNORMAL LOW (ref 13.0–17.1)
LYMPH#: 0.4 10*3/uL — AB (ref 0.9–3.3)
LYMPH%: 14.1 % (ref 14.0–49.0)
MCH: 34.2 pg — ABNORMAL HIGH (ref 27.2–33.4)
MCHC: 34.4 g/dL (ref 32.0–36.0)
MCV: 99.2 fL — ABNORMAL HIGH (ref 79.3–98.0)
MONO#: 0.5 10*3/uL (ref 0.1–0.9)
MONO%: 17.3 % — ABNORMAL HIGH (ref 0.0–14.0)
NEUT#: 1.8 10*3/uL (ref 1.5–6.5)
NEUT%: 63.4 % (ref 39.0–75.0)
Platelets: 56 10*3/uL — ABNORMAL LOW (ref 140–400)
RBC: 3.2 10*6/uL — AB (ref 4.20–5.82)
RDW: 13.8 % (ref 11.0–14.6)
WBC: 2.9 10*3/uL — AB (ref 4.0–10.3)

## 2014-07-21 LAB — TECHNOLOGIST REVIEW

## 2014-07-21 MED ORDER — PREGABALIN 100 MG PO CAPS: 100.0000 mg | ORAL_CAPSULE | Freq: Two times a day (BID) | ORAL | Status: AC

## 2014-07-21 MED ORDER — SODIUM CHLORIDE 0.9 % IJ SOLN
10.0000 mL | INTRAMUSCULAR | Status: DC | PRN
  Administered 2014-07-21: 10 mL via INTRAVENOUS
  Filled 2014-07-21: qty 10

## 2014-07-21 MED ORDER — INV-ELOTUZUMAB CHEMO INJECTION 400MG BMS CA204-143
10.0000 mg/kg | Freq: Once | INTRAMUSCULAR | Status: AC
  Administered 2014-07-21: 1060 mg via INTRAVENOUS
  Filled 2014-07-21: qty 42.4

## 2014-07-21 MED ORDER — FAMOTIDINE IN NACL 20-0.9 MG/50ML-% IV SOLN
INTRAVENOUS | Status: AC
  Filled 2014-07-21: qty 50

## 2014-07-21 MED ORDER — DEXAMETHASONE SODIUM PHOSPHATE 10 MG/ML IJ SOLN
INTRAMUSCULAR | Status: AC
  Filled 2014-07-21: qty 1

## 2014-07-21 MED ORDER — ACETAMINOPHEN 325 MG PO TABS
ORAL_TABLET | ORAL | Status: AC
Start: 2014-07-21 — End: 2014-07-21
  Filled 2014-07-21: qty 2

## 2014-07-21 MED ORDER — DIPHENHYDRAMINE HCL 25 MG PO CAPS
ORAL_CAPSULE | ORAL | Status: AC
  Filled 2014-07-21: qty 2

## 2014-07-21 MED ORDER — HEPARIN SOD (PORK) LOCK FLUSH 100 UNIT/ML IV SOLN
500.0000 [IU] | Freq: Once | INTRAVENOUS | Status: AC | PRN
  Administered 2014-07-21: 500 [IU]
  Filled 2014-07-21: qty 5

## 2014-07-21 MED ORDER — FAMOTIDINE IN NACL 20-0.9 MG/50ML-% IV SOLN
20.0000 mg | Freq: Once | INTRAVENOUS | Status: AC
  Administered 2014-07-21: 20 mg via INTRAVENOUS

## 2014-07-21 MED ORDER — DIPHENHYDRAMINE HCL 25 MG PO CAPS
50.0000 mg | ORAL_CAPSULE | Freq: Once | ORAL | Status: AC
  Administered 2014-07-21: 50 mg via ORAL

## 2014-07-21 MED ORDER — DEXAMETHASONE SODIUM PHOSPHATE 10 MG/ML IJ SOLN
8.0000 mg | Freq: Once | INTRAMUSCULAR | Status: AC
  Administered 2014-07-21: 8 mg via INTRAVENOUS

## 2014-07-21 MED ORDER — SODIUM CHLORIDE 0.9 % IJ SOLN
10.0000 mL | INTRAMUSCULAR | Status: DC | PRN
  Administered 2014-07-21: 10 mL
  Filled 2014-07-21: qty 10

## 2014-07-21 MED ORDER — ACETAMINOPHEN 325 MG PO TABS
650.0000 mg | ORAL_TABLET | Freq: Once | ORAL | Status: AC
  Administered 2014-07-21: 650 mg via ORAL

## 2014-07-21 MED ORDER — SODIUM CHLORIDE 0.9 % IV SOLN
Freq: Once | INTRAVENOUS | Status: AC
  Administered 2014-07-21: 10:00:00 via INTRAVENOUS

## 2014-07-21 NOTE — Progress Notes (Signed)
[facility] OFFICE PROGRESS NOTE  Patient Care Team: Kandice Hams, MD as PCP - General (Internal Medicine) Annia Belt, MD as Consulting Physician (Internal Medicine) Heath Lark, MD as Consulting Physician (Hematology and Oncology)  SUMMARY OF ONCOLOGIC HISTORY:  He was initially diagnosed in June 2010. He had a heavy myeloma burden at time of diagnosis with multiple lytic bone lesions, 87% plasma cells in the bone marrow, and 6.7 g of protein in the urine. 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 He went on to receive high-dose IV melphalan with autologous stem cell support at [facility] 03/12/2010. We began to see small amounts of monoclonal lambda free light chains again in his urine in March of 2012. He was started on Revlimid 10 mg daily beginning in April 2012. Dose had to be adjusted due to myelosuppression down to 5 mg. At time of a visit here in December 2013, he was complaining of recurrent pain in his shoulders, bilateral ribs, and low back. Lab showed rise in his serum and urine paraprotein levels. He was restaged with a bone marrow biopsy on 12/31/2012 which showed 10% plasma cells, rise in total urine protein from 260 mg to 1039 mg, and rise in serum free lambda light chains from 4.8 mg percent in July 2013,to 10 mg percent on October 29, to a 25.8 mg percent by December 16, to 58.5 mg percent by 01/07/2013. He was then started him on pomalidomide on 01/28/2013. Initial dose 3 mg 21 days on 7 days rest.  He tolerated the drug well. Unfortunately, at time of his visit here on August 2014 he appeared to be breaking through the pomalidomide. Although his hematologic profile remained stable, there was a progressive rise in the lambda free light chains up to 116 mg percent as of 07/26/2013 compared with 58 on May 28. 24 hour urine total protein back up to 6645 mg. After a lengthy discussion  with respect to treatment options, he was started on trial of Bendamustine plus prednisone. This was started on September 4. He had 6 cycles through  02/01/2014. He appeared to have a initial improvement with fall in his serum lambda free light chains from 116 mg percent down to 91 by November 2014, fall in 24 hour urine protein from 6.6 g down to 5.4, fall in his creatinine from 1.4 down to 1.1. Unfortunately, recent reevaluation done on February 01, 2014 shows rise in the serum light chains up to 261 mg percent, urine 24-hour protein up to 14.4 g, and creatinine up to 1.3.  On 02/28/2014, he was started on a trial of kyprolis. Repeat serum lambda light chain on 04/10/2014 showed that the patient is responding to treatment. On 06/20/2014, repeat serum light chain is getting worse and treatment was discontinued. The patient is enrolled in clinical trial and will begin first dose of chemotherapy using combination dexamethasone, Revlimid and Elotuzumab on 07/07/2014.  INTERVAL HISTORY: Please see below for problem oriented charting. He is seen prior to week 3 of therapy. He is improving slowly and is able to drink adequate fluids at home. He complained of leg swelling. His wife stated that the patient is not motivated to participate with physical therapy. He denies any recent fever, chills, night sweats or abnormal weight loss The patient denies any recent signs or symptoms of bleeding such as spontaneous epistaxis, hematuria or hematochezia. He denies nausea. He has mild  constipation.  REVIEW OF SYSTEMS:   Constitutional: Denies fevers, chills or abnormal weight loss Eyes: Denies blurriness of vision Ears, nose, mouth, throat, and face: Denies mucositis or sore throat Respiratory: Denies cough, dyspnea or wheezes Cardiovascular: Denies palpitation, chest discomfort  Skin: Denies abnormal skin rashes Lymphatics: Denies new lymphadenopathy or easy bruising Neurological:Denies numbness, tingling or  new weaknesses Behavioral/Psych: Mood is stable, no new changes  All other systems were reviewed with the patient and are negative.  I have reviewed the past medical history, past surgical history, social history and family history with the patient and they are unchanged from previous note.  ALLERGIES:  has No Known Allergies.  MEDICATIONS:  Current Outpatient Prescriptions  Medication Sig Dispense Refill  . acetaminophen (TYLENOL) 500 MG tablet Take 500 mg by mouth every 6 (six) hours as needed. For pain.      Marland Kitchen aspirin EC 81 MG tablet Take 81 mg by mouth daily after breakfast.       . atorvastatin (LIPITOR) 10 MG tablet Take 10 mg by mouth at bedtime.        . BD ULTRA-FINE PEN NEEDLES 29G X 12.7MM MISC Inject 30 Units into the skin daily.       . carbidopa-levodopa (SINEMET IR) 25-100 MG per tablet TAKE 1 TABLET 3 TIMES A DAY  90 tablet  6  . dexamethasone (DECADRON) 4 MG tablet Take 7 tablets (28 mg total) by mouth once a week.  100 tablet  1  . Insulin Glargine (LANTUS SOLOSTAR [place]) Inject 20-30 Units into the skin every evening.       Marland Kitchen lenalidomide (REVLIMID) 25 MG capsule Take 1 capsule (25 mg total) by mouth daily.  21 capsule  0  . lidocaine-prilocaine (EMLA) cream Apply 1 application topically as needed. Apply to port at least 1 hour before procedure & cover.  30 g  1  . morphine (MS CONTIN) 30 MG 12 hr tablet Take 1 tablet (30 mg total) by mouth every 12 (twelve) hours.  60 tablet  0  . morphine (MSIR) 15 MG tablet Take 1 tablet (15 mg total) by mouth every 4 (four) hours as needed for severe pain.  90 tablet  0  . nystatin cream (MYCOSTATIN)       . ondansetron (ZOFRAN-ODT) 8 MG disintegrating tablet Take 8 mg by mouth every 8 (eight) hours as needed for nausea or vomiting.      . polyethylene glycol (MIRALAX / GLYCOLAX) packet Take 17 g by mouth daily after breakfast.       . rasagiline (AZILECT) 1 MG TABS tablet Take 1 tablet (1 mg total) by mouth daily.  30 tablet  6  .  valACYclovir (VALTREX) 500 MG tablet Take 1 tablet (500 mg total) by mouth daily. Takes once daily.  90 tablet  3  . furosemide (LASIX) 40 MG tablet Take 1 tablet (40 mg total) by mouth daily.  30 tablet  11  . pregabalin (LYRICA) 100 MG capsule Take 1 capsule (100 mg total) by mouth 2 (two) times daily.  60 capsule  6   No current facility-administered medications for this visit.   Facility-Administered Medications Ordered in Other Visits  Medication Dose Route Frequency Provider Last Rate Last Dose  . elotuzumab (BMS [id number]) 1,060 mg in sodium chloride 0.9 % 230 mL chemo infusion  10 mg/kg (Order-Specific) Intravenous Once Heath Lark, MD      . heparin lock flush 100 unit/mL  500 Units Intracatheter Once PRN Heath Lark,  MD      . sodium chloride 0.9 % injection 10 mL  10 mL Intravenous PRN Heath Lark, MD   10 mL at 05/09/14 1045  . sodium chloride 0.9 % injection 10 mL  10 mL Intracatheter PRN Heath Lark, MD        PHYSICAL EXAMINATION: ECOG PERFORMANCE STATUS: 2 - Symptomatic, <50% confined to bed  Filed Vitals:   07/21/14 0926  BP: 129/59  Pulse: 69  Temp: 98.5 F (36.9 C)  Resp: 20   Filed Weights   07/21/14 0926  Weight: 233 lb 11.2 oz (106.006 kg)    GENERAL:alert, no distress and comfortable SKIN: skin color, texture, turgor are normal, no rashes or significant lesions EYES: normal, Conjunctiva are pink and non-injected, sclera clear OROPHARYNX:no exudate, no erythema and lips, buccal mucosa, and tongue normal  NECK: supple, thyroid normal size, non-tender, without nodularity LYMPH:  no palpable lymphadenopathy in the cervical, axillary or inguinal LUNGS: clear to auscultation and percussion with normal breathing effort HEART: regular rate & rhythm and no murmurs with moderate bilateral lower extremity edema ABDOMEN:abdomen soft, non-tender and normal bowel sounds Musculoskeletal:no cyanosis of digits and no clubbing  NEURO: alert & oriented x 3 with fluent speech,  no focal motor/sensory deficits  LABORATORY DATA:  I have reviewed the data as listed    Component Value Date/Time   NA 142 07/21/2014 0852   NA 138 12/16/2013 1350   NA 145 06/13/2009 1503   K 3.9 07/21/2014 0852   K 3.6 12/16/2013 1350   K 4.8* 06/13/2009 1503   CL 104 12/16/2013 1350   CL 104 05/18/2013 1113   CL 108 06/13/2009 1503   CO2 26 07/21/2014 0852   CO2 24 12/16/2013 1350   CO2 29 06/13/2009 1503   GLUCOSE 194* 07/21/2014 0852   GLUCOSE 192* 12/16/2013 1350   GLUCOSE 325* 05/18/2013 1113   GLUCOSE 114 06/13/2009 1503   BUN 13.0 07/21/2014 0852   BUN 10 12/16/2013 1350   BUN 49* 06/13/2009 1503   CREATININE 1.6* 07/21/2014 0852   CREATININE 1.28 12/16/2013 1350   CREATININE 1.33 07/22/2012 1334   CREATININE 2.9* 06/13/2009 1503   CALCIUM 8.6 07/21/2014 0852   CALCIUM 7.7* 12/16/2013 1350   CALCIUM 10.5* 06/13/2009 1503   PROT 5.4* 07/21/2014 0852   PROT 5.1* 12/16/2013 1350   PROT 6.4 06/13/2009 1503   ALBUMIN 3.3* 07/21/2014 0852   ALBUMIN 2.9* 12/16/2013 1350   AST 13 07/21/2014 0852   AST 26 12/16/2013 1350   AST 21 06/13/2009 1503   ALT <6 07/21/2014 0852   ALT <5 12/16/2013 1350   ALT 25 06/13/2009 1503   ALKPHOS 87 07/21/2014 0852   ALKPHOS 109 12/16/2013 1350   ALKPHOS 135* 06/13/2009 1503   BILITOT 0.86 07/21/2014 0852   BILITOT 0.5 12/16/2013 1350   BILITOT 0.60 06/13/2009 1503   GFRNONAA 54* 12/16/2013 1350   GFRAA 62* 12/16/2013 1350    No results found for this basename: SPEP,  UPEP,   kappa and lambda light chains    Lab Results  Component Value Date   WBC 2.9* 07/21/2014   NEUTROABS 1.8 07/21/2014   HGB 10.9* 07/21/2014   HCT 31.8* 07/21/2014   MCV 99.2* 07/21/2014   PLT 56* 07/21/2014      Chemistry      Component Value Date/Time   NA 142 07/21/2014 0852   NA 138 12/16/2013 1350   NA 145 06/13/2009 1503   K 3.9 07/21/2014 8333  K 3.6 12/16/2013 1350   K 4.8* 06/13/2009 1503   CL 104 12/16/2013 1350   CL 104 05/18/2013 1113   CL 108 06/13/2009 1503   CO2  26 07/21/2014 0852   CO2 24 12/16/2013 1350   CO2 29 06/13/2009 1503   BUN 13.0 07/21/2014 0852   BUN 10 12/16/2013 1350   BUN 49* 06/13/2009 1503   CREATININE 1.6* 07/21/2014 0852   CREATININE 1.28 12/16/2013 1350   CREATININE 1.33 07/22/2012 1334   CREATININE 2.9* 06/13/2009 1503      Component Value Date/Time   CALCIUM 8.6 07/21/2014 0852   CALCIUM 7.7* 12/16/2013 1350   CALCIUM 10.5* 06/13/2009 1503   ALKPHOS 87 07/21/2014 0852   ALKPHOS 109 12/16/2013 1350   ALKPHOS 135* 06/13/2009 1503   AST 13 07/21/2014 0852   AST 26 12/16/2013 1350   AST 21 06/13/2009 1503   ALT <6 07/21/2014 0852   ALT <5 12/16/2013 1350   ALT 25 06/13/2009 1503   BILITOT 0.86 07/21/2014 0852   BILITOT 0.5 12/16/2013 1350   BILITOT 0.60 06/13/2009 1503      ASSESSMENT & PLAN:  Multiple myeloma in relapse The patient has very poor baseline performance status with progressive decline despite salvage chemotherapy last week. With aggressive supportive care, he is slightly improving. I will see him back in 3 weeks but we will continue current dose medications without dose adjustment.   Thrombocytopenia due to drugs The cause is likely due to recent treatment and disease. It is mild and there is little change compared from previous platelet count. The patient denies recent history of bleeding such as epistaxis, hematuria or hematochezia. He is asymptomatic from the thrombocytopenia. I will observe for now.  he does not require transfusion now. I will not to adjust the dose of his chemotherapy unless it dropped to less than 50,000. As long as his platelet count is above 50,000, he will remain on aspirin 81 mg daily to prevent DVT.         Peripheral neuropathy, secondary to drugs or chemicals Continue to monitor closely. He continues taking pain medicine. I refilled his prescription lyrica.     Leg edema This is related to fluid retention, low albumin and poor mobility. I told him to discontinue Lasix due to  elevated creatinine. I recommend physical therapy   DVT of lower extremity (deep venous thrombosis) He will continue aspirin therapy.  CRI (chronic renal insufficiency) Kidney function is stable today. I will monitor closely due to high risk of tumor lysis syndrome.    Anemia in neoplastic disease This is likely anemia of chronic disease. The patient denies recent history of bleeding such as epistaxis, hematuria or hematochezia. He is asymptomatic from the anemia. We will observe for now.  He does not require transfusion now.        Diabetic neuropathy, type II diabetes mellitus His blood sugar may go high during treatment due to high-dose dexamethasone. He is doing well with reduced dose dexamethasone at 20 mg weekly.     No orders of the defined types were placed in this encounter.   All questions were answered. The patient knows to call the clinic with any problems, questions or concerns. No barriers to learning was detected. I spent 40 minutes counseling the patient face to face. The total time spent in the appointment was 55 minutes and more than 50% was on counseling and review of test results     [facility], [place], MD 07/21/2014 11:00 AM

## 2014-07-21 NOTE — Assessment & Plan Note (Signed)
He will continue aspirin therapy.

## 2014-07-21 NOTE — Patient Instructions (Signed)
[facility] Discharge Instructions for Patients Receiving Chemotherapy  Today you received the following chemotherapy agents Elotuzumab.  To help prevent nausea and vomiting after your treatment, we encourage you to take your nausea medication as prescribed.   If you develop nausea and vomiting that is not controlled by your nausea medication, call the clinic.   BELOW ARE SYMPTOMS THAT SHOULD BE REPORTED IMMEDIATELY:  *FEVER GREATER THAN 100.5 F  *CHILLS WITH OR WITHOUT FEVER  NAUSEA AND VOMITING THAT IS NOT CONTROLLED WITH YOUR NAUSEA MEDICATION  *UNUSUAL SHORTNESS OF BREATH  *UNUSUAL BRUISING OR BLEEDING  TENDERNESS IN MOUTH AND THROAT WITH OR WITHOUT PRESENCE OF ULCERS  *URINARY PROBLEMS  *BOWEL PROBLEMS  UNUSUAL RASH Items with * indicate a potential emergency and should be followed up as soon as possible.  Feel free to call the clinic you have any questions or concerns. The clinic phone number is [phone].    

## 2014-07-21 NOTE — Patient Instructions (Signed)

## 2014-07-21 NOTE — Telephone Encounter (Signed)
gv and printed appt sched and avs for pt for Aug....sed added tx. °

## 2014-07-21 NOTE — Assessment & Plan Note (Signed)
The patient has very poor baseline performance status with progressive decline despite salvage chemotherapy last week. With aggressive supportive care, he is slightly improving. I will see him back in 3 weeks but we will continue current dose medications without dose adjustment.

## 2014-07-21 NOTE — Telephone Encounter (Signed)
gv adn rpinted appt sched and avs for pt for Aug...sed added tx. °

## 2014-07-21 NOTE — Assessment & Plan Note (Signed)
This is related to fluid retention, low albumin and poor mobility. I told him to discontinue Lasix due to elevated creatinine. I recommend physical therapy

## 2014-07-21 NOTE — Assessment & Plan Note (Signed)
This is likely anemia of chronic disease. The patient denies recent history of bleeding such as epistaxis, hematuria or hematochezia. He is asymptomatic from the anemia. We will observe for now.  He does not require transfusion now.   

## 2014-07-21 NOTE — Assessment & Plan Note (Addendum)
Continue to monitor closely. He continues taking pain medicine. I refilled his prescription lyrica.

## 2014-07-21 NOTE — Assessment & Plan Note (Signed)
Kidney function is stable today. I will monitor closely due to high risk of tumor lysis syndrome.

## 2014-07-21 NOTE — Assessment & Plan Note (Signed)
The cause is likely due to recent treatment and disease. It is mild and there is little change compared from previous platelet count. The patient denies recent history of bleeding such as epistaxis, hematuria or hematochezia. He is asymptomatic from the thrombocytopenia. I will observe for now.  he does not require transfusion now. I will not to adjust the dose of his chemotherapy unless it dropped to less than 50,000. As long as his platelet count is above 50,000, he will remain on aspirin 81 mg daily to prevent DVT.

## 2014-07-21 NOTE — Assessment & Plan Note (Signed)
His blood sugar may go high during treatment due to high-dose dexamethasone. He is doing well with reduced dose dexamethasone at 20 mg weekly.

## 2014-07-21 NOTE — Progress Notes (Signed)
Ok to treat with low platelet count today and swelling in legs per Dr. Alvy Bimler.

## 2014-07-24 ENCOUNTER — Telehealth: Payer: Self-pay | Admitting: *Deleted

## 2014-07-24 NOTE — Telephone Encounter (Signed)
Call from [place] at [facility] requests verbal order [facility] for Physical Therapy Evaluation and treat?

## 2014-07-24 NOTE — Telephone Encounter (Signed)
Notified Heather w/ [facility] of verbal order for PHT per Dr. Alvy Bimler.

## 2014-07-24 NOTE — Telephone Encounter (Signed)
Yes please

## 2014-07-27 ENCOUNTER — Other Ambulatory Visit: Payer: Self-pay | Admitting: *Deleted

## 2014-07-27 DIAGNOSIS — C9002 Multiple myeloma in relapse: Secondary | ICD-10-CM

## 2014-07-27 MED ORDER — LENALIDOMIDE 25 MG PO CAPS: 25.0000 mg | ORAL_CAPSULE | Freq: Every day | ORAL | Status: DC

## 2014-07-28 ENCOUNTER — Ambulatory Visit (HOSPITAL_BASED_OUTPATIENT_CLINIC_OR_DEPARTMENT_OTHER): Payer: Medicare Other

## 2014-07-28 ENCOUNTER — Other Ambulatory Visit (HOSPITAL_BASED_OUTPATIENT_CLINIC_OR_DEPARTMENT_OTHER): Payer: Medicare Other

## 2014-07-28 ENCOUNTER — Ambulatory Visit: Payer: Medicare Other

## 2014-07-28 ENCOUNTER — Other Ambulatory Visit: Payer: Self-pay | Admitting: Hematology and Oncology

## 2014-07-28 VITALS — BP 121/49 | HR 75 | Temp 98.6°F

## 2014-07-28 DIAGNOSIS — C9002 Multiple myeloma in relapse: Secondary | ICD-10-CM

## 2014-07-28 DIAGNOSIS — Z95828 Presence of other vascular implants and grafts: Secondary | ICD-10-CM

## 2014-07-28 DIAGNOSIS — IMO0002 Reserved for concepts with insufficient information to code with codable children: Secondary | ICD-10-CM

## 2014-07-28 DIAGNOSIS — N182 Chronic kidney disease, stage 2 (mild): Secondary | ICD-10-CM

## 2014-07-28 DIAGNOSIS — B029 Zoster without complications: Secondary | ICD-10-CM

## 2014-07-28 DIAGNOSIS — Z5112 Encounter for antineoplastic immunotherapy: Secondary | ICD-10-CM

## 2014-07-28 LAB — COMPREHENSIVE METABOLIC PANEL (CC13)
ALT: <6 U/L (ref 0–55)
ANION GAP: 9 meq/L (ref 3–11)
AST: 12 U/L (ref 5–34)
Albumin: 3.3 g/dL — ABNORMAL LOW (ref 3.5–5.0)
Alkaline Phosphatase: 98 U/L (ref 40–150)
BUN: 11.8 mg/dL (ref 7.0–26.0)
CALCIUM: 8.5 mg/dL (ref 8.4–10.4)
CHLORIDE: 108 meq/L (ref 98–109)
CO2: 24 meq/L (ref 22–29)
CREATININE: 1.4 mg/dL — AB (ref 0.7–1.3)
GLUCOSE: 238 mg/dL — AB (ref 70–140)
Potassium: 4 mEq/L (ref 3.5–5.1)
Sodium: 141 mEq/L (ref 136–145)
TOTAL PROTEIN: 5.4 g/dL — AB (ref 6.4–8.3)
Total Bilirubin: 1.17 mg/dL (ref 0.20–1.20)

## 2014-07-28 LAB — CBC WITH DIFFERENTIAL/PLATELET
BASO%: 1 % (ref 0.0–2.0)
Basophils Absolute: 0 10*3/uL (ref 0.0–0.1)
EOS%: 7 % (ref 0.0–7.0)
Eosinophils Absolute: 0.2 10*3/uL (ref 0.0–0.5)
HEMATOCRIT: 32.5 % — AB (ref 38.4–49.9)
HGB: 10.9 g/dL — ABNORMAL LOW (ref 13.0–17.1)
LYMPH#: 0.5 10*3/uL — AB (ref 0.9–3.3)
LYMPH%: 19.8 % (ref 14.0–49.0)
MCH: 33.8 pg — ABNORMAL HIGH (ref 27.2–33.4)
MCHC: 33.7 g/dL (ref 32.0–36.0)
MCV: 100.1 fL — ABNORMAL HIGH (ref 79.3–98.0)
MONO#: 0.5 10*3/uL (ref 0.1–0.9)
MONO%: 22.7 % — ABNORMAL HIGH (ref 0.0–14.0)
NEUT#: 1.2 10*3/uL — ABNORMAL LOW (ref 1.5–6.5)
NEUT%: 49.5 % (ref 39.0–75.0)
Platelets: 63 10*3/uL — ABNORMAL LOW (ref 140–400)
RBC: 3.24 10*6/uL — ABNORMAL LOW (ref 4.20–5.82)
RDW: 14.1 % (ref 11.0–14.6)
WBC: 2.4 10*3/uL — ABNORMAL LOW (ref 4.0–10.3)

## 2014-07-28 LAB — TECHNOLOGIST REVIEW: Technologist Review: 4

## 2014-07-28 MED ORDER — DEXAMETHASONE SODIUM PHOSPHATE 10 MG/ML IJ SOLN
8.0000 mg | Freq: Once | INTRAMUSCULAR | Status: AC
  Administered 2014-07-28: 8 mg via INTRAVENOUS

## 2014-07-28 MED ORDER — FAMOTIDINE IN NACL 20-0.9 MG/50ML-% IV SOLN
INTRAVENOUS | Status: AC
  Filled 2014-07-28: qty 50

## 2014-07-28 MED ORDER — ACETAMINOPHEN 325 MG PO TABS
ORAL_TABLET | ORAL | Status: AC
  Filled 2014-07-28: qty 2

## 2014-07-28 MED ORDER — SODIUM CHLORIDE 0.9 % IJ SOLN
10.0000 mL | INTRAMUSCULAR | Status: DC | PRN
  Administered 2014-07-28: 10 mL via INTRAVENOUS
  Filled 2014-07-28: qty 10

## 2014-07-28 MED ORDER — DEXAMETHASONE SODIUM PHOSPHATE 10 MG/ML IJ SOLN
INTRAMUSCULAR | Status: AC
  Filled 2014-07-28: qty 1

## 2014-07-28 MED ORDER — SODIUM CHLORIDE 0.9 % IV SOLN
Freq: Once | INTRAVENOUS | Status: AC
  Administered 2014-07-28: 10:00:00 via INTRAVENOUS

## 2014-07-28 MED ORDER — ACETAMINOPHEN 325 MG PO TABS
650.0000 mg | ORAL_TABLET | Freq: Once | ORAL | Status: AC
  Administered 2014-07-28: 650 mg via ORAL

## 2014-07-28 MED ORDER — HEPARIN SOD (PORK) LOCK FLUSH 100 UNIT/ML IV SOLN
500.0000 [IU] | Freq: Once | INTRAVENOUS | Status: AC | PRN
  Administered 2014-07-28: 500 [IU]
  Filled 2014-07-28: qty 5

## 2014-07-28 MED ORDER — FAMOTIDINE IN NACL 20-0.9 MG/50ML-% IV SOLN
20.0000 mg | Freq: Once | INTRAVENOUS | Status: AC
  Administered 2014-07-28: 20 mg via INTRAVENOUS

## 2014-07-28 MED ORDER — SODIUM CHLORIDE 0.9 % IV SOLN
10.0000 mg/kg | Freq: Once | INTRAVENOUS | Status: AC
  Administered 2014-07-28: 1060 mg via INTRAVENOUS
  Filled 2014-07-28: qty 42.4

## 2014-07-28 MED ORDER — DIPHENHYDRAMINE HCL 25 MG PO CAPS
50.0000 mg | ORAL_CAPSULE | Freq: Once | ORAL | Status: AC
  Administered 2014-07-28: 50 mg via ORAL

## 2014-07-28 MED ORDER — DIPHENHYDRAMINE HCL 25 MG PO CAPS
ORAL_CAPSULE | ORAL | Status: AC
  Filled 2014-07-28: qty 2

## 2014-07-28 MED ORDER — SODIUM CHLORIDE 0.9 % IJ SOLN
10.0000 mL | INTRAMUSCULAR | Status: DC | PRN
  Administered 2014-07-28: 10 mL
  Filled 2014-07-28: qty 10

## 2014-07-28 NOTE — Patient Instructions (Signed)

## 2014-07-28 NOTE — Patient Instructions (Signed)
[facility] Discharge Instructions for Patients Receiving Chemotherapy  Today you received the following chemotherapy agents elotuzumab  To help prevent nausea and vomiting after your treatment, we encourage you to take your nausea medication as directed   If you develop nausea and vomiting that is not controlled by your nausea medication, call the clinic.   BELOW ARE SYMPTOMS THAT SHOULD BE REPORTED IMMEDIATELY:  *FEVER GREATER THAN 100.5 F  *CHILLS WITH OR WITHOUT FEVER  NAUSEA AND VOMITING THAT IS NOT CONTROLLED WITH YOUR NAUSEA MEDICATION  *UNUSUAL SHORTNESS OF BREATH  *UNUSUAL BRUISING OR BLEEDING  TENDERNESS IN MOUTH AND THROAT WITH OR WITHOUT PRESENCE OF ULCERS  *URINARY PROBLEMS  *BOWEL PROBLEMS  UNUSUAL RASH Items with * indicate a potential emergency and should be followed up as soon as possible.  Feel free to call the clinic you have any questions or concerns. The clinic phone number is [phone].  

## 2014-07-31 NOTE — Telephone Encounter (Signed)
Phone call - encounter closed. 

## 2014-07-31 NOTE — Telephone Encounter (Signed)
RECEIVED A FAX FROM [facility] CONCERNING A CONFIRMATION OF PRESCRIPTION SHIPMENT FOR REVLIMID ON 07/28/14.

## 2014-08-04 ENCOUNTER — Ambulatory Visit (HOSPITAL_BASED_OUTPATIENT_CLINIC_OR_DEPARTMENT_OTHER): Payer: Medicare Other

## 2014-08-04 ENCOUNTER — Ambulatory Visit: Payer: Medicare Other | Admitting: Cardiology

## 2014-08-04 ENCOUNTER — Other Ambulatory Visit (HOSPITAL_BASED_OUTPATIENT_CLINIC_OR_DEPARTMENT_OTHER): Payer: Medicare Other

## 2014-08-04 ENCOUNTER — Encounter: Payer: Self-pay | Admitting: *Deleted

## 2014-08-04 VITALS — BP 123/49 | HR 58 | Temp 98.0°F | Resp 18 | Wt 232.1 lb

## 2014-08-04 DIAGNOSIS — D6959 Other secondary thrombocytopenia: Secondary | ICD-10-CM

## 2014-08-04 DIAGNOSIS — N182 Chronic kidney disease, stage 2 (mild): Secondary | ICD-10-CM

## 2014-08-04 DIAGNOSIS — IMO0002 Reserved for concepts with insufficient information to code with codable children: Secondary | ICD-10-CM

## 2014-08-04 DIAGNOSIS — C9002 Multiple myeloma in relapse: Secondary | ICD-10-CM

## 2014-08-04 DIAGNOSIS — Z5112 Encounter for antineoplastic immunotherapy: Secondary | ICD-10-CM

## 2014-08-04 DIAGNOSIS — Z95828 Presence of other vascular implants and grafts: Secondary | ICD-10-CM

## 2014-08-04 LAB — CBC WITH DIFFERENTIAL/PLATELET
BASO%: 3.8 % — ABNORMAL HIGH (ref 0.0–2.0)
BASOS ABS: 0.1 10*3/uL (ref 0.0–0.1)
EOS%: 1.7 % (ref 0.0–7.0)
Eosinophils Absolute: 0 10*3/uL (ref 0.0–0.5)
HCT: 30 % — ABNORMAL LOW (ref 38.4–49.9)
HEMOGLOBIN: 10.4 g/dL — AB (ref 13.0–17.1)
LYMPH%: 25.5 % (ref 14.0–49.0)
MCH: 33.7 pg — ABNORMAL HIGH (ref 27.2–33.4)
MCHC: 34.7 g/dL (ref 32.0–36.0)
MCV: 97.1 fL (ref 79.3–98.0)
MONO#: 0.4 10*3/uL (ref 0.1–0.9)
MONO%: 16.2 % — ABNORMAL HIGH (ref 0.0–14.0)
NEUT#: 1.2 10*3/uL — ABNORMAL LOW (ref 1.5–6.5)
NEUT%: 52.8 % (ref 39.0–75.0)
Platelets: 80 10*3/uL — ABNORMAL LOW (ref 140–400)
RBC: 3.09 10*6/uL — ABNORMAL LOW (ref 4.20–5.82)
RDW: 14.8 % — ABNORMAL HIGH (ref 11.0–14.6)
WBC: 2.4 10*3/uL — ABNORMAL LOW (ref 4.0–10.3)
lymph#: 0.6 10*3/uL — ABNORMAL LOW (ref 0.9–3.3)
nRBC: 0 % (ref 0–0)

## 2014-08-04 LAB — COMPREHENSIVE METABOLIC PANEL (CC13)
ALK PHOS: 96 U/L (ref 40–150)
ALT: <6 U/L (ref 0–55)
AST: 13 U/L (ref 5–34)
Albumin: 3.1 g/dL — ABNORMAL LOW (ref 3.5–5.0)
Anion Gap: 7 mEq/L (ref 3–11)
BILIRUBIN TOTAL: 0.77 mg/dL (ref 0.20–1.20)
BUN: 11.5 mg/dL (ref 7.0–26.0)
CO2: 25 mEq/L (ref 22–29)
Calcium: 8.5 mg/dL (ref 8.4–10.4)
Chloride: 109 mEq/L (ref 98–109)
Creatinine: 1.4 mg/dL — ABNORMAL HIGH (ref 0.7–1.3)
Glucose: 245 mg/dl — ABNORMAL HIGH (ref 70–140)
Potassium: 3.9 mEq/L (ref 3.5–5.1)
Sodium: 141 mEq/L (ref 136–145)
Total Protein: 5.1 g/dL — ABNORMAL LOW (ref 6.4–8.3)

## 2014-08-04 MED ORDER — SODIUM CHLORIDE 0.9 % IJ SOLN
10.0000 mL | INTRAMUSCULAR | Status: DC | PRN
  Administered 2014-08-04: 10 mL via INTRAVENOUS
  Filled 2014-08-04: qty 10

## 2014-08-04 MED ORDER — SODIUM CHLORIDE 0.9 % IV SOLN
1055.0000 mg | Freq: Once | INTRAVENOUS | Status: AC
  Administered 2014-08-04: 1055 mg via INTRAVENOUS
  Filled 2014-08-04: qty 42.2

## 2014-08-04 MED ORDER — ACETAMINOPHEN 325 MG PO TABS
ORAL_TABLET | ORAL | Status: AC
  Filled 2014-08-04: qty 2

## 2014-08-04 MED ORDER — FAMOTIDINE IN NACL 20-0.9 MG/50ML-% IV SOLN
INTRAVENOUS | Status: AC
  Filled 2014-08-04: qty 50

## 2014-08-04 MED ORDER — DIPHENHYDRAMINE HCL 25 MG PO CAPS
ORAL_CAPSULE | ORAL | Status: AC
  Filled 2014-08-04: qty 2

## 2014-08-04 MED ORDER — HEPARIN SOD (PORK) LOCK FLUSH 100 UNIT/ML IV SOLN
500.0000 [IU] | Freq: Once | INTRAVENOUS | Status: AC
  Administered 2014-08-04: 500 [IU] via INTRAVENOUS
  Filled 2014-08-04: qty 5

## 2014-08-04 MED ORDER — ACETAMINOPHEN 325 MG PO TABS
650.0000 mg | ORAL_TABLET | Freq: Once | ORAL | Status: AC
  Administered 2014-08-04: 650 mg via ORAL

## 2014-08-04 MED ORDER — DIPHENHYDRAMINE HCL 25 MG PO CAPS
50.0000 mg | ORAL_CAPSULE | Freq: Once | ORAL | Status: AC
  Administered 2014-08-04: 50 mg via ORAL

## 2014-08-04 MED ORDER — DEXAMETHASONE SODIUM PHOSPHATE 10 MG/ML IJ SOLN
8.0000 mg | Freq: Once | INTRAMUSCULAR | Status: AC
  Administered 2014-08-04: 8 mg via INTRAVENOUS

## 2014-08-04 MED ORDER — SODIUM CHLORIDE 0.9 % IV SOLN
Freq: Once | INTRAVENOUS | Status: AC
  Administered 2014-08-04: 10:00:00 via INTRAVENOUS

## 2014-08-04 MED ORDER — DEXAMETHASONE SODIUM PHOSPHATE 10 MG/ML IJ SOLN
INTRAMUSCULAR | Status: AC
  Filled 2014-08-04: qty 1

## 2014-08-04 MED ORDER — FAMOTIDINE IN NACL 20-0.9 MG/50ML-% IV SOLN
20.0000 mg | Freq: Once | INTRAVENOUS | Status: AC
  Administered 2014-08-04: 20 mg via INTRAVENOUS

## 2014-08-04 NOTE — Progress Notes (Signed)
BMS [id number] Cycle 2 08/04/14 Day 1 [name] [name] is in the cancer center today for treatment with elotuzumab. His wife reports he took decadron 20 mg this morning at 6:25 and began a new cycle of revlimid this morning. His weight is stable.  Dose of elotuzumab recalculated using day 1 weight.   8/21 Cycle 2, day 8. [name] [name] is in the cancer center today for lab work, physical exam, and treatment with elotuzumab. He was seen by Dr. Alvy Bimler. She has instructed him to hold the revlimid for 1 week D/T thrombocytopenia. He took 2o mg decadron this morning around 6:30. Will proceed with elotuzumab, 1055 mg.  Sign for infusion given to L. Kalman Shan, RN with instructions about the rate of infusion and timing of pre-medications in relation to IP.   Cycle 2, day 15. [name] [name] is in the cancer center today for lab work and treatment on the [place] 143 study. Dr. Alvy Bimler reviewed his lab results and gave instructions for him to restart the revlimid. Instructions given by this Probation officer  to Mr. And Mrs. Bitner to take revlimid today through next Thursday.  Sign for infusion given to L. Chestnutt, RN  with instructions about the rate of infusion and timing of pre-medications in relation to IP.  Cycle 2, Day 22 [name] [name] is in the cancer center today for lab work, physical exam, and treatment with elotuzumab. He is seen by Dr. Alvy Bimler. She reviewed the l;ab results. He will proceed with treatment today. He and his wife verify he took 20 mg of decadron this morning.  Sign for infusion given to C. Sabra Heck, RN and B. Linus Orn, RN. Reviewed the filter requirement, rate of infusion, and timing of pre-medications in relation to IP.

## 2014-08-04 NOTE — Progress Notes (Signed)
Pt weighed on scales in chemo room which was off from previous therefore reweighed on scales on MD side & this was closer to where he had been.   Per [facility] RN/Mary OK to treat despite counts per Dr. Alvy Bimler & she does not want anyone to hold his treatment besides her.

## 2014-08-04 NOTE — Patient Instructions (Signed)
[facility] Discharge Instructions for Patients Receiving Chemotherapy  Today you received the following chemotherapy agents: [facility]- elotuzumab  To help prevent nausea and vomiting after your treatment, we encourage you to take your nausea medication.   If you develop nausea and vomiting that is not controlled by your nausea medication, call the clinic.   BELOW ARE SYMPTOMS THAT SHOULD BE REPORTED IMMEDIATELY:  *FEVER GREATER THAN 100.5 F  *CHILLS WITH OR WITHOUT FEVER  NAUSEA AND VOMITING THAT IS NOT CONTROLLED WITH YOUR NAUSEA MEDICATION  *UNUSUAL SHORTNESS OF BREATH  *UNUSUAL BRUISING OR BLEEDING  TENDERNESS IN MOUTH AND THROAT WITH OR WITHOUT PRESENCE OF ULCERS  *URINARY PROBLEMS  *BOWEL PROBLEMS  UNUSUAL RASH Items with * indicate a potential emergency and should be followed up as soon as possible.  Feel free to call the clinic you have any questions or concerns. The clinic phone number is [phone].

## 2014-08-10 ENCOUNTER — Telehealth: Payer: Self-pay | Admitting: *Deleted

## 2014-08-10 NOTE — Telephone Encounter (Signed)
Called patient left a message that his appt needs to be r/s due to admin time. Patient is to call back and r/s

## 2014-08-11 ENCOUNTER — Ambulatory Visit (HOSPITAL_BASED_OUTPATIENT_CLINIC_OR_DEPARTMENT_OTHER): Payer: Medicare Other | Admitting: Hematology and Oncology

## 2014-08-11 ENCOUNTER — Other Ambulatory Visit: Payer: Self-pay | Admitting: Hematology and Oncology

## 2014-08-11 ENCOUNTER — Encounter: Payer: Self-pay | Admitting: Hematology and Oncology

## 2014-08-11 ENCOUNTER — Other Ambulatory Visit (HOSPITAL_BASED_OUTPATIENT_CLINIC_OR_DEPARTMENT_OTHER): Payer: Medicare Other

## 2014-08-11 ENCOUNTER — Ambulatory Visit: Payer: Medicare Other

## 2014-08-11 ENCOUNTER — Telehealth: Payer: Self-pay | Admitting: Hematology and Oncology

## 2014-08-11 ENCOUNTER — Ambulatory Visit (HOSPITAL_BASED_OUTPATIENT_CLINIC_OR_DEPARTMENT_OTHER): Payer: Medicare Other

## 2014-08-11 VITALS — BP 122/50 | HR 89 | Temp 98.6°F | Resp 18 | Ht 71.0 in | Wt 230.0 lb

## 2014-08-11 DIAGNOSIS — C9002 Multiple myeloma in relapse: Secondary | ICD-10-CM

## 2014-08-11 DIAGNOSIS — R600 Localized edema: Secondary | ICD-10-CM

## 2014-08-11 DIAGNOSIS — R609 Edema, unspecified: Secondary | ICD-10-CM

## 2014-08-11 DIAGNOSIS — N182 Chronic kidney disease, stage 2 (mild): Secondary | ICD-10-CM

## 2014-08-11 DIAGNOSIS — N183 Chronic kidney disease, stage 3 unspecified: Secondary | ICD-10-CM

## 2014-08-11 DIAGNOSIS — N189 Chronic kidney disease, unspecified: Secondary | ICD-10-CM

## 2014-08-11 DIAGNOSIS — Z5112 Encounter for antineoplastic immunotherapy: Secondary | ICD-10-CM

## 2014-08-11 DIAGNOSIS — D6959 Other secondary thrombocytopenia: Secondary | ICD-10-CM

## 2014-08-11 DIAGNOSIS — IMO0002 Reserved for concepts with insufficient information to code with codable children: Secondary | ICD-10-CM

## 2014-08-11 DIAGNOSIS — T50905A Adverse effect of unspecified drugs, medicaments and biological substances, initial encounter: Secondary | ICD-10-CM

## 2014-08-11 DIAGNOSIS — D630 Anemia in neoplastic disease: Secondary | ICD-10-CM

## 2014-08-11 DIAGNOSIS — G622 Polyneuropathy due to other toxic agents: Secondary | ICD-10-CM

## 2014-08-11 DIAGNOSIS — Z95828 Presence of other vascular implants and grafts: Secondary | ICD-10-CM

## 2014-08-11 LAB — CBC WITH DIFFERENTIAL/PLATELET
BASO%: 2 % (ref 0.0–2.0)
Basophils Absolute: 0.1 10*3/uL (ref 0.0–0.1)
EOS%: 4.9 % (ref 0.0–7.0)
Eosinophils Absolute: 0.2 10*3/uL (ref 0.0–0.5)
HCT: 31.2 % — ABNORMAL LOW (ref 38.4–49.9)
HGB: 10.7 g/dL — ABNORMAL LOW (ref 13.0–17.1)
LYMPH%: 17.8 % (ref 14.0–49.0)
MCH: 33.4 pg (ref 27.2–33.4)
MCHC: 34.3 g/dL (ref 32.0–36.0)
MCV: 97.5 fL (ref 79.3–98.0)
MONO#: 0.3 10*3/uL (ref 0.1–0.9)
MONO%: 9.7 % (ref 0.0–14.0)
NEUT#: 2.3 10*3/uL (ref 1.5–6.5)
NEUT%: 65.6 % (ref 39.0–75.0)
Platelets: 47 10*3/uL — ABNORMAL LOW (ref 140–400)
RBC: 3.2 10*6/uL — AB (ref 4.20–5.82)
RDW: 15 % — AB (ref 11.0–14.6)
WBC: 3.5 10*3/uL — ABNORMAL LOW (ref 4.0–10.3)
lymph#: 0.6 10*3/uL — ABNORMAL LOW (ref 0.9–3.3)
nRBC: 0 % (ref 0–0)

## 2014-08-11 LAB — COMPREHENSIVE METABOLIC PANEL (CC13)
ALT: <6 U/L (ref 0–55)
AST: 13 U/L (ref 5–34)
Albumin: 3.1 g/dL — ABNORMAL LOW (ref 3.5–5.0)
Alkaline Phosphatase: 92 U/L (ref 40–150)
Anion Gap: 8 mEq/L (ref 3–11)
BILIRUBIN TOTAL: 1.31 mg/dL — AB (ref 0.20–1.20)
BUN: 14.4 mg/dL (ref 7.0–26.0)
CO2: 24 mEq/L (ref 22–29)
CREATININE: 1.6 mg/dL — AB (ref 0.7–1.3)
Calcium: 8.4 mg/dL (ref 8.4–10.4)
Chloride: 107 mEq/L (ref 98–109)
GLUCOSE: 274 mg/dL — AB (ref 70–140)
Potassium: 3.8 mEq/L (ref 3.5–5.1)
SODIUM: 138 meq/L (ref 136–145)
TOTAL PROTEIN: 5.2 g/dL — AB (ref 6.4–8.3)

## 2014-08-11 LAB — TECHNOLOGIST REVIEW

## 2014-08-11 MED ORDER — DEXAMETHASONE SODIUM PHOSPHATE 20 MG/5ML IJ SOLN
INTRAMUSCULAR | Status: AC
  Filled 2014-08-11: qty 5

## 2014-08-11 MED ORDER — DEXAMETHASONE SODIUM PHOSPHATE 10 MG/ML IJ SOLN
8.0000 mg | Freq: Once | INTRAMUSCULAR | Status: AC
  Administered 2014-08-11: 8 mg via INTRAVENOUS

## 2014-08-11 MED ORDER — SODIUM CHLORIDE 0.9 % IJ SOLN
10.0000 mL | INTRAMUSCULAR | Status: DC | PRN
  Administered 2014-08-11: 10 mL via INTRAVENOUS
  Filled 2014-08-11: qty 10

## 2014-08-11 MED ORDER — SODIUM CHLORIDE 0.9 % IJ SOLN
10.0000 mL | INTRAMUSCULAR | Status: DC | PRN
  Administered 2014-08-11: 10 mL
  Filled 2014-08-11: qty 10

## 2014-08-11 MED ORDER — DIPHENHYDRAMINE HCL 25 MG PO CAPS
50.0000 mg | ORAL_CAPSULE | Freq: Once | ORAL | Status: AC
  Administered 2014-08-11: 50 mg via ORAL

## 2014-08-11 MED ORDER — ACETAMINOPHEN 325 MG PO TABS
650.0000 mg | ORAL_TABLET | Freq: Once | ORAL | Status: AC
  Administered 2014-08-11: 650 mg via ORAL

## 2014-08-11 MED ORDER — ACETAMINOPHEN 325 MG PO TABS
ORAL_TABLET | ORAL | Status: AC
  Filled 2014-08-11: qty 2

## 2014-08-11 MED ORDER — FAMOTIDINE IN NACL 20-0.9 MG/50ML-% IV SOLN
INTRAVENOUS | Status: AC
  Filled 2014-08-11: qty 50

## 2014-08-11 MED ORDER — ONDANSETRON 8 MG/NS 50 ML IVPB
INTRAVENOUS | Status: AC
  Filled 2014-08-11: qty 8

## 2014-08-11 MED ORDER — INV-ELOTUZUMAB CHEMO INJECTION 400MG BMS CA204-143
10.0000 mg/kg | Freq: Once | INTRAMUSCULAR | Status: AC
  Administered 2014-08-11: 1055 mg via INTRAVENOUS
  Filled 2014-08-11: qty 42.2

## 2014-08-11 MED ORDER — DIPHENHYDRAMINE HCL 25 MG PO CAPS
ORAL_CAPSULE | ORAL | Status: AC
  Filled 2014-08-11: qty 2

## 2014-08-11 MED ORDER — DEXAMETHASONE SODIUM PHOSPHATE 10 MG/ML IJ SOLN
INTRAMUSCULAR | Status: AC
  Filled 2014-08-11: qty 1

## 2014-08-11 MED ORDER — FAMOTIDINE IN NACL 20-0.9 MG/50ML-% IV SOLN
20.0000 mg | Freq: Once | INTRAVENOUS | Status: AC
  Administered 2014-08-11: 20 mg via INTRAVENOUS

## 2014-08-11 MED ORDER — HEPARIN SOD (PORK) LOCK FLUSH 100 UNIT/ML IV SOLN
500.0000 [IU] | Freq: Once | INTRAVENOUS | Status: AC | PRN
  Administered 2014-08-11: 500 [IU]
  Filled 2014-08-11: qty 5

## 2014-08-11 MED ORDER — SODIUM CHLORIDE 0.9 % IV SOLN
Freq: Once | INTRAVENOUS | Status: AC
  Administered 2014-08-11: 11:00:00 via INTRAVENOUS

## 2014-08-11 NOTE — Progress Notes (Signed)
Treatment parameters verified with Stanton Kidney, RN OK to treat per research and MD.

## 2014-08-11 NOTE — Patient Instructions (Signed)
[facility] Discharge Instructions for Patients Receiving Chemotherapy  Today you received the following chemotherapy agents elotuzumab.  To help prevent nausea and vomiting after your treatment, we encourage you to take your nausea medication as prescribed.   If you develop nausea and vomiting that is not controlled by your nausea medication, call the clinic.   BELOW ARE SYMPTOMS THAT SHOULD BE REPORTED IMMEDIATELY:  *FEVER GREATER THAN 100.5 F  *CHILLS WITH OR WITHOUT FEVER  NAUSEA AND VOMITING THAT IS NOT CONTROLLED WITH YOUR NAUSEA MEDICATION  *UNUSUAL SHORTNESS OF BREATH  *UNUSUAL BRUISING OR BLEEDING  TENDERNESS IN MOUTH AND THROAT WITH OR WITHOUT PRESENCE OF ULCERS  *URINARY PROBLEMS  *BOWEL PROBLEMS  UNUSUAL RASH Items with * indicate a potential emergency and should be followed up as soon as possible.  Feel free to call the clinic you have any questions or concerns. The clinic phone number is [phone].

## 2014-08-11 NOTE — Assessment & Plan Note (Signed)
As mentioned above, I will hold Revlimid for one week. So far, he has no bleeding complications.

## 2014-08-11 NOTE — Assessment & Plan Note (Signed)
This is related to fluid retention, low albumin and poor mobility. I told him to discontinue Lasix due to elevated creatinine. I recommend physical therapy

## 2014-08-11 NOTE — Telephone Encounter (Signed)
gv and printed appt sched and avs for pt for Aug thru [place].....Marland Kitchensed added tx.

## 2014-08-11 NOTE — Assessment & Plan Note (Signed)
His blood sugar may go high during treatment due to high-dose dexamethasone. He is doing well with reduced dose dexamethasone at 20 mg weekly.

## 2014-08-11 NOTE — Assessment & Plan Note (Signed)
He tolerated treatment well. I will start ordering repeat blood work to assess response to treatment next week. I will make mild dose adjustment to hold Revlimid this week due to thrombocytopenia.

## 2014-08-11 NOTE — Telephone Encounter (Signed)
Patient was r/s by the front office.

## 2014-08-11 NOTE — Patient Instructions (Signed)

## 2014-08-11 NOTE — Assessment & Plan Note (Signed)
Kidney function is stable today. I will monitor closely

## 2014-08-11 NOTE — Assessment & Plan Note (Signed)
This is likely anemia of chronic disease. The patient denies recent history of bleeding such as epistaxis, hematuria or hematochezia. He is asymptomatic from the anemia. We will observe for now.  He does not require transfusion now.   

## 2014-08-11 NOTE — Telephone Encounter (Signed)
gv and printed appt sched and avs for ptr for Sept...sed added tx.

## 2014-08-11 NOTE — Progress Notes (Signed)
[place] OFFICE PROGRESS NOTE  Patient Care Team: Kandice Hams, MD as PCP - General (Internal Medicine) Annia Belt, MD as Consulting Physician (Internal Medicine) Heath Lark, MD as Consulting Physician (Hematology and Oncology)  SUMMARY OF ONCOLOGIC HISTORY:  He was initially diagnosed in June 2010. He had a heavy myeloma burden at time of diagnosis with multiple lytic bone lesions, 87% plasma cells in the bone marrow, and 6.7 g of protein in the urine. 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 He went on to receive high-dose IV melphalan with autologous stem cell support at [facility] 03/12/2010. We began to see small amounts of monoclonal lambda free light chains again in his urine in March of 2012. He was started on Revlimid 10 mg daily beginning in April 2012. Dose had to be adjusted due to myelosuppression down to 5 mg. At time of a visit here in December 2013, he was complaining of recurrent pain in his shoulders, bilateral ribs, and low back. Lab showed rise in his serum and urine paraprotein levels. He was restaged with a bone marrow biopsy on 12/31/2012 which showed 10% plasma cells, rise in total urine protein from 260 mg to 1039 mg, and rise in serum free lambda light chains from 4.8 mg percent in July 2013,to 10 mg percent on October 29, to a 25.8 mg percent by December 16, to 58.5 mg percent by 01/07/2013. He was then started him on pomalidomide on 01/28/2013. Initial dose 3 mg 21 days on 7 days rest.  He tolerated the drug well. Unfortunately, at time of his visit here on August 2014 he appeared to be breaking through the pomalidomide. Although his hematologic profile remained stable, there was a progressive rise in the lambda free light chains up to 116 mg percent as of 07/26/2013 compared with 58 on May 28. 24 hour urine total protein back up to 6645 mg. After a lengthy discussion  with respect to treatment options, he was started on trial of Bendamustine plus prednisone. This was started on September 4. He had 6 cycles through  02/01/2014. He appeared to have a initial improvement with fall in his serum lambda free light chains from 116 mg percent down to 91 by November 2014, fall in 24 hour urine protein from 6.6 g down to 5.4, fall in his creatinine from 1.4 down to 1.1. Unfortunately, recent reevaluation done on February 01, 2014 shows rise in the serum light chains up to 261 mg percent, urine 24-hour protein up to 14.4 g, and creatinine up to 1.3.  On 02/28/2014, he was started on a trial of kyprolis. Repeat serum lambda light chain on 04/10/2014 showed that the patient is responding to treatment. On 06/20/2014, repeat serum light chain is getting worse and treatment was discontinued. The patient is enrolled in clinical trial and will begin first dose of chemotherapy using combination dexamethasone, Revlimid and Elotuzumab on 07/07/2014.  INTERVAL HISTORY: Please see below for problem oriented charting. He is here prior to 6 dose of chemotherapy. He had recent fall this morning but had no injury. He continues to feel weak. Physical therapist has been coming to his house twice a week but the patient is not motivated to do any physical activity if the therapist was not present. His diabetes appeared to be reasonably controlled. He had persistent leg edema. His appetite remained poor. The patient denies any recent signs or symptoms  of bleeding such as spontaneous epistaxis, hematuria or hematochezia.  REVIEW OF SYSTEMS:   Constitutional: Denies fevers, chills or abnormal weight loss Eyes: Denies blurriness of vision Ears, nose, mouth, throat, and face: Denies mucositis or sore throat Respiratory: Denies cough, dyspnea or wheezes Cardiovascular: Denies palpitation, chest discomfort  Gastrointestinal:  Denies nausea, heartburn or change in bowel habits Skin: Denies  abnormal skin rashes Lymphatics: Denies new lymphadenopathy or easy bruising Neurological:Denies numbness, tingling or new weaknesses Behavioral/Psych: Mood is stable, no new changes  All other systems were reviewed with the patient and are negative.  I have reviewed the past medical history, past surgical history, social history and family history with the patient and they are unchanged from previous note.  ALLERGIES:  has No Known Allergies.  MEDICATIONS:  Current Outpatient Prescriptions  Medication Sig Dispense Refill  . acetaminophen (TYLENOL) 500 MG tablet Take 500 mg by mouth every 6 (six) hours as needed. For pain.      Marland Kitchen aspirin EC 81 MG tablet Take 81 mg by mouth daily after breakfast.       . atorvastatin (LIPITOR) 10 MG tablet Take 10 mg by mouth at bedtime.        . BD ULTRA-FINE PEN NEEDLES 29G X 12.7MM MISC Inject 30 Units into the skin daily.       . carbidopa-levodopa (SINEMET IR) 25-100 MG per tablet TAKE 1 TABLET 3 TIMES A DAY  90 tablet  6  . dexamethasone (DECADRON) 4 MG tablet Take 7 tablets (28 mg total) by mouth once a week.  100 tablet  1  . furosemide (LASIX) 40 MG tablet Take 1 tablet (40 mg total) by mouth daily.  30 tablet  11  . Insulin Glargine (LANTUS SOLOSTAR [place]) Inject 20-30 Units into the skin every evening.       Marland Kitchen lenalidomide (REVLIMID) 25 MG capsule Take 1 capsule (25 mg total) by mouth daily.  21 capsule  0  . lidocaine-prilocaine (EMLA) cream Apply 1 application topically as needed. Apply to port at least 1 hour before procedure & cover.  30 g  1  . morphine (MS CONTIN) 30 MG 12 hr tablet Take 1 tablet (30 mg total) by mouth every 12 (twelve) hours.  60 tablet  0  . morphine (MSIR) 15 MG tablet Take 1 tablet (15 mg total) by mouth every 4 (four) hours as needed for severe pain.  90 tablet  0  . nystatin cream (MYCOSTATIN)       . ondansetron (ZOFRAN-ODT) 8 MG disintegrating tablet Take 8 mg by mouth every 8 (eight) hours as needed for nausea or  vomiting.      . polyethylene glycol (MIRALAX / GLYCOLAX) packet Take 17 g by mouth daily after breakfast.       . pregabalin (LYRICA) 100 MG capsule Take 1 capsule (100 mg total) by mouth 2 (two) times daily.  60 capsule  6  . rasagiline (AZILECT) 1 MG TABS tablet Take 1 tablet (1 mg total) by mouth daily.  30 tablet  6  . valACYclovir (VALTREX) 500 MG tablet Take 1 tablet (500 mg total) by mouth daily. Takes once daily.  90 tablet  3   No current facility-administered medications for this visit.   Facility-Administered Medications Ordered in Other Visits  Medication Dose Route Frequency Provider Last Rate Last Dose  . sodium chloride 0.9 % injection 10 mL  10 mL Intravenous PRN Heath Lark, MD   10 mL at 05/09/14 1045  .  sodium chloride 0.9 % injection 10 mL  10 mL Intravenous PRN Heath Lark, MD   10 mL at 08/11/14 0917    PHYSICAL EXAMINATION: ECOG PERFORMANCE STATUS: 1 - Symptomatic but completely ambulatory  Filed Vitals:   08/11/14 0901  BP: 122/50  Pulse: 89  Temp: 98.6 F (37 C)  Resp: 18   Filed Weights   08/11/14 0901  Weight: 230 lb (104.327 kg)    GENERAL:alert, no distress and comfortable SKIN: skin color, texture, turgor are normal, no rashes or significant lesions EYES: normal, Conjunctiva are pink and non-injected, sclera clear OROPHARYNX:no exudate, no erythema and lips, buccal mucosa, and tongue normal  NECK: supple, thyroid normal size, non-tender, without nodularity LYMPH:  no palpable lymphadenopathy in the cervical, axillary or inguinal LUNGS: clear to auscultation and percussion with normal breathing effort HEART: regular rate & rhythm and no murmurs with moderate bilateral lower extremity edema ABDOMEN:abdomen soft, non-tender and normal bowel sounds Musculoskeletal:no cyanosis of digits and no clubbing  NEURO: alert & oriented x 3 with fluent speech, no focal motor/sensory deficits  LABORATORY DATA:  I have reviewed the data as listed    Component  Value Date/Time   NA 141 08/04/2014 0858   NA 138 12/16/2013 1350   NA 145 06/13/2009 1503   K 3.9 08/04/2014 0858   K 3.6 12/16/2013 1350   K 4.8* 06/13/2009 1503   CL 104 12/16/2013 1350   CL 104 05/18/2013 1113   CL 108 06/13/2009 1503   CO2 25 08/04/2014 0858   CO2 24 12/16/2013 1350   CO2 29 06/13/2009 1503   GLUCOSE 245* 08/04/2014 0858   GLUCOSE 192* 12/16/2013 1350   GLUCOSE 325* 05/18/2013 1113   GLUCOSE 114 06/13/2009 1503   BUN 11.5 08/04/2014 0858   BUN 10 12/16/2013 1350   BUN 49* 06/13/2009 1503   CREATININE 1.4* 08/04/2014 0858   CREATININE 1.28 12/16/2013 1350   CREATININE 1.33 07/22/2012 1334   CREATININE 2.9* 06/13/2009 1503   CALCIUM 8.5 08/04/2014 0858   CALCIUM 7.7* 12/16/2013 1350   CALCIUM 10.5* 06/13/2009 1503   PROT 5.1* 08/04/2014 0858   PROT 5.1* 12/16/2013 1350   PROT 6.4 06/13/2009 1503   ALBUMIN 3.1* 08/04/2014 0858   ALBUMIN 2.9* 12/16/2013 1350   AST 13 08/04/2014 0858   AST 26 12/16/2013 1350   AST 21 06/13/2009 1503   ALT <6 08/04/2014 0858   ALT <5 12/16/2013 1350   ALT 25 06/13/2009 1503   ALKPHOS 96 08/04/2014 0858   ALKPHOS 109 12/16/2013 1350   ALKPHOS 135* 06/13/2009 1503   BILITOT 0.77 08/04/2014 0858   BILITOT 0.5 12/16/2013 1350   BILITOT 0.60 06/13/2009 1503   GFRNONAA 54* 12/16/2013 1350   GFRAA 62* 12/16/2013 1350    No results found for this basename: SPEP,  UPEP,   kappa and lambda light chains    Lab Results  Component Value Date   WBC 3.5* 08/11/2014   NEUTROABS 2.3 08/11/2014   HGB 10.7* 08/11/2014   HCT 31.2* 08/11/2014   MCV 97.5 08/11/2014   PLT 47* 08/11/2014      Chemistry      Component Value Date/Time   NA 141 08/04/2014 0858   NA 138 12/16/2013 1350   NA 145 06/13/2009 1503   K 3.9 08/04/2014 0858   K 3.6 12/16/2013 1350   K 4.8* 06/13/2009 1503   CL 104 12/16/2013 1350   CL 104 05/18/2013 1113   CL 108 06/13/2009 1503  CO2 25 08/04/2014 0858   CO2 24 12/16/2013 1350   CO2 29 06/13/2009 1503   BUN 11.5 08/04/2014 0858   BUN  10 12/16/2013 1350   BUN 49* 06/13/2009 1503   CREATININE 1.4* 08/04/2014 0858   CREATININE 1.28 12/16/2013 1350   CREATININE 1.33 07/22/2012 1334   CREATININE 2.9* 06/13/2009 1503      Component Value Date/Time   CALCIUM 8.5 08/04/2014 0858   CALCIUM 7.7* 12/16/2013 1350   CALCIUM 10.5* 06/13/2009 1503   ALKPHOS 96 08/04/2014 0858   ALKPHOS 109 12/16/2013 1350   ALKPHOS 135* 06/13/2009 1503   AST 13 08/04/2014 0858   AST 26 12/16/2013 1350   AST 21 06/13/2009 1503   ALT <6 08/04/2014 0858   ALT <5 12/16/2013 1350   ALT 25 06/13/2009 1503   BILITOT 0.77 08/04/2014 0858   BILITOT 0.5 12/16/2013 1350   BILITOT 0.60 06/13/2009 1503     ASSESSMENT & PLAN:  Multiple myeloma in relapse He tolerated treatment well. I will start ordering repeat blood work to assess response to treatment next week. I will make mild dose adjustment to hold Revlimid this week due to thrombocytopenia.  Thrombocytopenia due to drugs As mentioned above, I will hold Revlimid for one week. So far, he has no bleeding complications.  Peripheral neuropathy, secondary to drugs or chemicals His blood sugar may go high during treatment due to high-dose dexamethasone. He is doing well with reduced dose dexamethasone at 20 mg weekly.     Leg edema This is related to fluid retention, low albumin and poor mobility. I told him to discontinue Lasix due to elevated creatinine. I recommend physical therapy     Anemia in neoplastic disease This is likely anemia of chronic disease. The patient denies recent history of bleeding such as epistaxis, hematuria or hematochezia. He is asymptomatic from the anemia. We will observe for now.  He does not require transfusion now.          CRI (chronic renal insufficiency) Kidney function is stable today. I will monitor closely        No orders of the defined types were placed in this encounter.   All questions were answered. The patient knows to call the clinic with any  problems, questions or concerns. No barriers to learning was detected.   [place], [place], MD 08/11/2014 9:55 AM

## 2014-08-11 NOTE — Progress Notes (Signed)
OK to treat with current labs. Pt to hold Revlimid X 1 week

## 2014-08-17 ENCOUNTER — Encounter: Payer: Self-pay | Admitting: Cardiology

## 2014-08-17 ENCOUNTER — Ambulatory Visit (INDEPENDENT_AMBULATORY_CARE_PROVIDER_SITE_OTHER): Payer: Medicare Other | Admitting: Cardiology

## 2014-08-17 VITALS — BP 116/84 | HR 84 | Ht 71.0 in | Wt 231.0 lb

## 2014-08-17 DIAGNOSIS — R600 Localized edema: Secondary | ICD-10-CM

## 2014-08-17 DIAGNOSIS — R0789 Other chest pain: Secondary | ICD-10-CM

## 2014-08-17 DIAGNOSIS — R609 Edema, unspecified: Secondary | ICD-10-CM

## 2014-08-17 NOTE — Patient Instructions (Signed)
The current medical regimen is effective;  continue present plan and medications.  Follow up as needed 

## 2014-08-17 NOTE — Progress Notes (Signed)
[place]. [address], [address], [place]  [address] Phone: [phone] Fax:  [phone]  Date:  08/17/2014   ID:  [name] [name], DOB [birth year], MRN [id number]  PCP:  Kandice Hams, MD   History of Present Illness: [name] [name] is a 73 y.o. male with nuclear stress test after ejection fraction was discovered to be 47% at [facility] with multiple myeloma, diabetes, hypertension, hyperlipidemia here for followup. History stress test did show inferior wall hypokinesis with ejection fraction of 47%-mildly reduced. His exercise time was also mildly reduced at 4 minutes and 59 seconds. He showed no signs of any significant ischemia. Overall low risk. He is currently on ARB Diovan at 80 mg low-dose. He does have diabetes. Stem cell transplant performed at [facility].   On chemo twice a week. One of the side effects can be swollen ankles. Also when lay on left side heart would hurt. This has gone away. His weight has increased. Fluid seems to have improved mildly with Lasix by oncology.  I obtained an echocardiogram on 06/30/14-normal ejection fraction. No signs of right-sided heart failure.  Oncology has stopped his Lasix because of renal insufficiency. I agree. Lower extremity edema still present.    Wt Readings from Last 3 Encounters:  08/17/14 231 lb (104.781 kg)  08/11/14 230 lb (104.327 kg)  08/04/14 232 lb 1.6 oz (105.28 kg)     Past Medical History  Diagnosis Date  . Diabetes mellitus   . GERD (gastroesophageal reflux disease)   . Anemia   . Hypertension   . Prostatic hyperplasia   . Arthritis   . Multiple myeloma in relapse 12/24/2011  . Benign essential HTN 12/24/2011  . CRI (chronic renal insufficiency) 12/26/2011  . 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 DVT of lower extremity (deep venous thrombosis) 05/14/2011  . Hyperlipidemia, mixed 12/26/2011  . Herpes zoster infection 03/11/2010  . DM II (diabetes mellitus, type II), controlled 12/26/2011  .  Pharyngitis, acute 03/21/2013  . 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 Diabetic neuropathy, type II diabetes mellitus 05/31/2013  . Urethritis 10/04/2013  . Dehydration 07/11/2014    Past Surgical History  Procedure Laterality Date  . Foot surgery Left     Current Outpatient Prescriptions  Medication Sig Dispense Refill  . acetaminophen (TYLENOL) 500 MG tablet Take 500 mg by mouth every 6 (six) hours as needed. For pain.      Marland Kitchen aspirin EC 81 MG tablet Take 81 mg by mouth daily after breakfast.       . atorvastatin (LIPITOR) 10 MG tablet Take 10 mg by mouth at bedtime.        . BD ULTRA-FINE PEN NEEDLES 29G X 12.7MM MISC Inject 30 Units into the skin daily.       . carbidopa-levodopa (SINEMET IR) 25-100 MG per tablet TAKE 1 TABLET 3 TIMES A DAY  90 tablet  6  . dexamethasone (DECADRON) 4 MG tablet Take 7 tablets (28 mg total) by mouth once a week.  100 tablet  1  . Insulin Glargine (LANTUS SOLOSTAR [place]) Inject 20-30 Units into the skin every evening.       Marland Kitchen lenalidomide (REVLIMID) 25 MG capsule Take 1 capsule (25 mg total) by mouth daily.  21 capsule  0  . lidocaine-prilocaine (EMLA) cream Apply 1 application topically as needed. Apply to port at least 1 hour before procedure & cover.  30 g  1  .  morphine (MS CONTIN) 30 MG 12 hr tablet Take 1 tablet (30 mg total) by mouth every 12 (twelve) hours.  60 tablet  0  . morphine (MSIR) 15 MG tablet Take 1 tablet (15 mg total) by mouth every 4 (four) hours as needed for severe pain.  90 tablet  0  . nystatin cream (MYCOSTATIN)       . ondansetron (ZOFRAN-ODT) 8 MG disintegrating tablet Take 8 mg by mouth every 8 (eight) hours as needed for nausea or vomiting.      . polyethylene glycol (MIRALAX / GLYCOLAX) packet Take 17 g by mouth daily after breakfast.       . pregabalin (LYRICA) 100 MG capsule Take 1 capsule (100 mg total) by mouth 2 (two) times daily.  60 capsule  6  . valACYclovir (VALTREX) 500 MG tablet Take 1  tablet (500 mg total) by mouth daily. Takes once daily.  90 tablet  3   No current facility-administered medications for this visit.   Facility-Administered Medications Ordered in Other Visits  Medication Dose Route Frequency Provider Last Rate Last Dose  . sodium chloride 0.9 % injection 10 mL  10 mL Intravenous PRN Heath Lark, MD   10 mL at 05/09/14 1045    Allergies:   No Known Allergies  Social History:  The patient  reports that he quit smoking about 6 years ago. He has never used smokeless tobacco. He reports that he does not drink alcohol or use illicit drugs.   Family History  Problem Relation Age of Onset  . CAD Neg Hx     ROS:  Please see the history of present illness.   Tired from chemo he states. Denies any syncope, bleeding, orthopnea, PND.   All other systems reviewed and negative.   PHYSICAL EXAM: VS:  BP 116/84  Pulse 84  Ht 5' 11"  (1.803 m)  Wt 231 lb (104.781 kg)  BMI 32.23 kg/m2 Well nourished, well developed, in no acute distress HEENT: normal, [place]/AT, EOMI Neck: no JVD, normal carotid upstroke, no bruit Cardiac:  normal S1, S2; RRR; no murmur Lungs:  clear to auscultation bilaterally, no wheezing, rhonchi or rales Abd: soft, nontender, no hepatomegaly, no bruits Ext: 2+ BLE edema, 2+ distal pulses, compression hose. Skin: warm and dry GU: deferred Neuro: no focal abnormalities noted, AAO x 3  EKG:  None today  ASSESSMENT AND PLAN:  1. Lower extremity edema-this may be a potential side effect of his current chemotherapeutic regimen/ low albumin. This may be a degree of venous insufficiency as well. EF normal. Reassuring. No longer on lasix. I also encouraged compression hose which his wife states have been trouble to get on in the past. Elevation of legs. Decrease salt, overall fluid intake. 2. Atypical chest pain-when laying on his left side, he had chest discomfort which was transient. He is improved. Echocardiogram reassuring 3. PRN follow  up.  Signed, Candee Furbish, MD [facility]  08/17/2014 2:59 PM

## 2014-08-18 ENCOUNTER — Other Ambulatory Visit: Payer: Self-pay | Admitting: Hematology and Oncology

## 2014-08-18 ENCOUNTER — Ambulatory Visit (HOSPITAL_BASED_OUTPATIENT_CLINIC_OR_DEPARTMENT_OTHER): Payer: Medicare Other

## 2014-08-18 ENCOUNTER — Ambulatory Visit: Payer: Medicare Other

## 2014-08-18 VITALS — BP 134/62 | HR 83 | Temp 97.6°F

## 2014-08-18 VITALS — BP 136/54 | HR 80 | Temp 98.5°F

## 2014-08-18 DIAGNOSIS — Z95828 Presence of other vascular implants and grafts: Secondary | ICD-10-CM

## 2014-08-18 DIAGNOSIS — Z5112 Encounter for antineoplastic immunotherapy: Secondary | ICD-10-CM

## 2014-08-18 DIAGNOSIS — N182 Chronic kidney disease, stage 2 (mild): Secondary | ICD-10-CM

## 2014-08-18 DIAGNOSIS — C9002 Multiple myeloma in relapse: Secondary | ICD-10-CM

## 2014-08-18 DIAGNOSIS — IMO0002 Reserved for concepts with insufficient information to code with codable children: Secondary | ICD-10-CM

## 2014-08-18 LAB — CBC WITH DIFFERENTIAL/PLATELET
BASO%: 3.1 % — AB (ref 0.0–2.0)
Basophils Absolute: 0.1 10*3/uL (ref 0.0–0.1)
EOS%: 2.5 % (ref 0.0–7.0)
Eosinophils Absolute: 0.1 10*3/uL (ref 0.0–0.5)
HCT: 30.9 % — ABNORMAL LOW (ref 38.4–49.9)
HGB: 10.5 g/dL — ABNORMAL LOW (ref 13.0–17.1)
LYMPH%: 33.8 % (ref 14.0–49.0)
MCH: 33.6 pg — ABNORMAL HIGH (ref 27.2–33.4)
MCHC: 33.9 g/dL (ref 32.0–36.0)
MCV: 99.2 fL — ABNORMAL HIGH (ref 79.3–98.0)
MONO#: 0.5 10*3/uL (ref 0.1–0.9)
MONO%: 20.5 % — ABNORMAL HIGH (ref 0.0–14.0)
NEUT%: 40.1 % (ref 39.0–75.0)
NEUTROS ABS: 1 10*3/uL — AB (ref 1.5–6.5)
PLATELETS: 65 10*3/uL — AB (ref 140–400)
RBC: 3.11 10*6/uL — AB (ref 4.20–5.82)
RDW: 14.5 % (ref 11.0–14.6)
WBC: 2.6 10*3/uL — AB (ref 4.0–10.3)
lymph#: 0.9 10*3/uL (ref 0.9–3.3)

## 2014-08-18 LAB — COMPREHENSIVE METABOLIC PANEL (CC13)
ALT: 6 U/L (ref 0–55)
AST: 14 U/L (ref 5–34)
Albumin: 3.2 g/dL — ABNORMAL LOW (ref 3.5–5.0)
Alkaline Phosphatase: 102 U/L (ref 40–150)
Anion Gap: 8 mEq/L (ref 3–11)
BILIRUBIN TOTAL: 1.05 mg/dL (ref 0.20–1.20)
BUN: 13.5 mg/dL (ref 7.0–26.0)
CO2: 23 meq/L (ref 22–29)
Calcium: 8.2 mg/dL — ABNORMAL LOW (ref 8.4–10.4)
Chloride: 107 mEq/L (ref 98–109)
Creatinine: 1.5 mg/dL — ABNORMAL HIGH (ref 0.7–1.3)
Glucose: 272 mg/dl — ABNORMAL HIGH (ref 70–140)
POTASSIUM: 3.8 meq/L (ref 3.5–5.1)
SODIUM: 138 meq/L (ref 136–145)
Total Protein: 5.3 g/dL — ABNORMAL LOW (ref 6.4–8.3)

## 2014-08-18 LAB — TECHNOLOGIST REVIEW

## 2014-08-18 MED ORDER — SODIUM CHLORIDE 0.9 % IV SOLN
10.0000 mg/kg | Freq: Once | INTRAVENOUS | Status: AC
  Administered 2014-08-18: 1055 mg via INTRAVENOUS
  Filled 2014-08-18: qty 42.2

## 2014-08-18 MED ORDER — DIPHENHYDRAMINE HCL 25 MG PO CAPS
ORAL_CAPSULE | ORAL | Status: AC
  Filled 2014-08-18: qty 2

## 2014-08-18 MED ORDER — DEXAMETHASONE SODIUM PHOSPHATE 10 MG/ML IJ SOLN
INTRAMUSCULAR | Status: AC
  Filled 2014-08-18: qty 1

## 2014-08-18 MED ORDER — FAMOTIDINE IN NACL 20-0.9 MG/50ML-% IV SOLN
20.0000 mg | Freq: Once | INTRAVENOUS | Status: AC
  Administered 2014-08-18: 20 mg via INTRAVENOUS

## 2014-08-18 MED ORDER — DIPHENHYDRAMINE HCL 25 MG PO CAPS
50.0000 mg | ORAL_CAPSULE | Freq: Once | ORAL | Status: AC
  Administered 2014-08-18: 50 mg via ORAL

## 2014-08-18 MED ORDER — ACETAMINOPHEN 325 MG PO TABS
ORAL_TABLET | ORAL | Status: AC
  Filled 2014-08-18: qty 2

## 2014-08-18 MED ORDER — SODIUM CHLORIDE 0.9 % IJ SOLN
10.0000 mL | INTRAMUSCULAR | Status: DC | PRN
  Administered 2014-08-18: 10 mL
  Filled 2014-08-18: qty 10

## 2014-08-18 MED ORDER — SODIUM CHLORIDE 0.9 % IJ SOLN
10.0000 mL | INTRAMUSCULAR | Status: DC | PRN
  Administered 2014-08-18: 10 mL via INTRAVENOUS
  Filled 2014-08-18: qty 10

## 2014-08-18 MED ORDER — DEXAMETHASONE SODIUM PHOSPHATE 10 MG/ML IJ SOLN
8.0000 mg | Freq: Once | INTRAMUSCULAR | Status: AC
  Administered 2014-08-18: 8 mg via INTRAVENOUS

## 2014-08-18 MED ORDER — HEPARIN SOD (PORK) LOCK FLUSH 100 UNIT/ML IV SOLN
500.0000 [IU] | Freq: Once | INTRAVENOUS | Status: AC | PRN
  Administered 2014-08-18: 500 [IU]
  Filled 2014-08-18: qty 5

## 2014-08-18 MED ORDER — SODIUM CHLORIDE 0.9 % IV SOLN
Freq: Once | INTRAVENOUS | Status: AC
  Administered 2014-08-18: 10:00:00 via INTRAVENOUS

## 2014-08-18 MED ORDER — FAMOTIDINE IN NACL 20-0.9 MG/50ML-% IV SOLN
INTRAVENOUS | Status: AC
  Filled 2014-08-18: qty 50

## 2014-08-18 MED ORDER — ACETAMINOPHEN 325 MG PO TABS
650.0000 mg | ORAL_TABLET | Freq: Once | ORAL | Status: AC
  Administered 2014-08-18: 650 mg via ORAL

## 2014-08-18 NOTE — Patient Instructions (Signed)
[facility] Discharge Instructions for Patients Receiving Chemotherapy  Today you received the following chemotherapy agents ; Elotuzumab  To help prevent nausea and vomiting after your treatment, we encourage you to take your nausea medication as directed.    If you develop nausea and vomiting that is not controlled by your nausea medication, call the clinic.   BELOW ARE SYMPTOMS THAT SHOULD BE REPORTED IMMEDIATELY:  *FEVER GREATER THAN 100.5 F  *CHILLS WITH OR WITHOUT FEVER  NAUSEA AND VOMITING THAT IS NOT CONTROLLED WITH YOUR NAUSEA MEDICATION  *UNUSUAL SHORTNESS OF BREATH  *UNUSUAL BRUISING OR BLEEDING  TENDERNESS IN MOUTH AND THROAT WITH OR WITHOUT PRESENCE OF ULCERS  *URINARY PROBLEMS  *BOWEL PROBLEMS  UNUSUAL RASH Items with * indicate a potential emergency and should be followed up as soon as possible.  Feel free to call the clinic you have any questions or concerns. The clinic phone number is [phone].    

## 2014-08-18 NOTE — Progress Notes (Signed)
Ok to treat per MD Gorsuch 

## 2014-08-21 ENCOUNTER — Ambulatory Visit: Payer: Medicare Other | Admitting: Nurse Practitioner

## 2014-08-22 ENCOUNTER — Telehealth: Payer: Self-pay | Admitting: *Deleted

## 2014-08-22 ENCOUNTER — Ambulatory Visit (HOSPITAL_BASED_OUTPATIENT_CLINIC_OR_DEPARTMENT_OTHER): Payer: Medicare Other

## 2014-08-22 ENCOUNTER — Other Ambulatory Visit (HOSPITAL_BASED_OUTPATIENT_CLINIC_OR_DEPARTMENT_OTHER): Payer: Medicare Other

## 2014-08-22 ENCOUNTER — Encounter: Payer: Self-pay | Admitting: Hematology and Oncology

## 2014-08-22 ENCOUNTER — Telehealth: Payer: Self-pay | Admitting: Hematology and Oncology

## 2014-08-22 ENCOUNTER — Other Ambulatory Visit: Payer: Self-pay | Admitting: Hematology and Oncology

## 2014-08-22 ENCOUNTER — Other Ambulatory Visit: Payer: Self-pay | Admitting: *Deleted

## 2014-08-22 VITALS — BP 105/41 | HR 94 | Temp 98.5°F | Resp 18

## 2014-08-22 DIAGNOSIS — C9002 Multiple myeloma in relapse: Secondary | ICD-10-CM

## 2014-08-22 DIAGNOSIS — R509 Fever, unspecified: Secondary | ICD-10-CM

## 2014-08-22 DIAGNOSIS — E860 Dehydration: Secondary | ICD-10-CM

## 2014-08-22 DIAGNOSIS — R300 Dysuria: Secondary | ICD-10-CM

## 2014-08-22 DIAGNOSIS — R1115 Cyclical vomiting syndrome unrelated to migraine: Secondary | ICD-10-CM

## 2014-08-22 DIAGNOSIS — Z95828 Presence of other vascular implants and grafts: Secondary | ICD-10-CM

## 2014-08-22 DIAGNOSIS — R410 Disorientation, unspecified: Secondary | ICD-10-CM

## 2014-08-22 DIAGNOSIS — R112 Nausea with vomiting, unspecified: Secondary | ICD-10-CM

## 2014-08-22 HISTORY — DX: Fever, unspecified: R50.9

## 2014-08-22 HISTORY — DX: Nausea with vomiting, unspecified: R11.2

## 2014-08-22 HISTORY — DX: Dysuria: R30.0

## 2014-08-22 HISTORY — DX: Disorientation, unspecified: R41.0

## 2014-08-22 LAB — SPEP & IFE WITH QIG
ALPHA-1-GLOBULIN: 7.9 % — AB (ref 2.9–4.9)
ALPHA-2-GLOBULIN: 9.6 % (ref 7.1–11.8)
Albumin ELP: 65 % (ref 55.8–66.1)
Beta 2: 2.9 % — ABNORMAL LOW (ref 3.2–6.5)
Beta Globulin: 6.3 % (ref 4.7–7.2)
GAMMA GLOBULIN: 8.3 % — AB (ref 11.1–18.8)
IGG (IMMUNOGLOBIN G), SERUM: 221 mg/dL — AB (ref 650–1600)
IgA: <6 mg/dL — ABNORMAL LOW (ref 68–379)
IgM, Serum: <5 mg/dL — ABNORMAL LOW (ref 41–251)
M-Spike, %: 0.24 g/dL
Total Protein, Serum Electrophoresis: 4.5 g/dL — ABNORMAL LOW (ref 6.0–8.3)

## 2014-08-22 LAB — CBC WITH DIFFERENTIAL/PLATELET
BASO%: 1.2 % (ref 0.0–2.0)
BASOS ABS: 0 10*3/uL (ref 0.0–0.1)
EOS%: 9.9 % — ABNORMAL HIGH (ref 0.0–7.0)
Eosinophils Absolute: 0.2 10*3/uL (ref 0.0–0.5)
HCT: 30.2 % — ABNORMAL LOW (ref 38.4–49.9)
HEMOGLOBIN: 10.1 g/dL — AB (ref 13.0–17.1)
LYMPH#: 0.6 10*3/uL — AB (ref 0.9–3.3)
LYMPH%: 30.9 % (ref 14.0–49.0)
MCH: 33.2 pg (ref 27.2–33.4)
MCHC: 33.4 g/dL (ref 32.0–36.0)
MCV: 99.7 fL — ABNORMAL HIGH (ref 79.3–98.0)
MONO#: 0.3 10*3/uL (ref 0.1–0.9)
MONO%: 13 % (ref 0.0–14.0)
NEUT#: 0.9 10*3/uL — ABNORMAL LOW (ref 1.5–6.5)
NEUT%: 45 % (ref 39.0–75.0)
PLATELETS: 55 10*3/uL — AB (ref 140–400)
RBC: 3.03 10*6/uL — ABNORMAL LOW (ref 4.20–5.82)
RDW: 14.7 % — ABNORMAL HIGH (ref 11.0–14.6)
WBC: 1.9 10*3/uL — AB (ref 4.0–10.3)

## 2014-08-22 LAB — URINALYSIS, MICROSCOPIC - CHCC
Bilirubin (Urine): NEGATIVE
GLUCOSE UR CHCC: NEGATIVE mg/dL
KETONES: NEGATIVE mg/dL
Leukocyte Esterase: NEGATIVE
Nitrite: NEGATIVE
Protein: 30 mg/dL
Specific Gravity, Urine: 1.025 (ref 1.003–1.035)
Urobilinogen, UR: 0.2 mg/dL (ref 0.2–1)
pH: 6 (ref 4.6–8.0)

## 2014-08-22 LAB — COMPREHENSIVE METABOLIC PANEL (CC13)
ALT: 10 U/L (ref 0–55)
ANION GAP: 9 meq/L (ref 3–11)
AST: 14 U/L (ref 5–34)
Albumin: 3.1 g/dL — ABNORMAL LOW (ref 3.5–5.0)
Alkaline Phosphatase: 95 U/L (ref 40–150)
BILIRUBIN TOTAL: 1.52 mg/dL — AB (ref 0.20–1.20)
BUN: 15.7 mg/dL (ref 7.0–26.0)
CALCIUM: 8.4 mg/dL (ref 8.4–10.4)
CHLORIDE: 106 meq/L (ref 98–109)
CO2: 24 mEq/L (ref 22–29)
CREATININE: 1.4 mg/dL — AB (ref 0.7–1.3)
GLUCOSE: 119 mg/dL (ref 70–140)
Potassium: 3.6 mEq/L (ref 3.5–5.1)
Sodium: 140 mEq/L (ref 136–145)
Total Protein: 5.3 g/dL — ABNORMAL LOW (ref 6.4–8.3)

## 2014-08-22 LAB — TECHNOLOGIST REVIEW

## 2014-08-22 LAB — KAPPA/LAMBDA LIGHT CHAINS
Kappa free light chain: 0.03 mg/dL — ABNORMAL LOW (ref 0.33–1.94)
Kappa:Lambda Ratio: 0 — ABNORMAL LOW (ref 0.26–1.65)
LAMBDA FREE LGHT CHN: 746 mg/dL — AB (ref 0.57–2.63)

## 2014-08-22 MED ORDER — ONDANSETRON 8 MG/50ML IVPB (CHCC)
8.0000 mg | Freq: Every day | INTRAVENOUS | Status: DC | PRN
Start: 2014-08-22 — End: 2014-08-22

## 2014-08-22 MED ORDER — SODIUM CHLORIDE 0.9 % IJ SOLN
10.0000 mL | INTRAMUSCULAR | Status: DC | PRN
  Administered 2014-08-22: 10 mL via INTRAVENOUS
  Filled 2014-08-22: qty 10

## 2014-08-22 MED ORDER — SODIUM CHLORIDE 0.9 % IV SOLN
Freq: Once | INTRAVENOUS | Status: AC
  Administered 2014-08-22: 14:00:00 via INTRAVENOUS

## 2014-08-22 MED ORDER — SODIUM CHLORIDE 0.9 % IJ SOLN
10.0000 mL | INTRAMUSCULAR | Status: DC | PRN
  Administered 2014-08-22: 10 mL
  Filled 2014-08-22: qty 10

## 2014-08-22 MED ORDER — HEPARIN SOD (PORK) LOCK FLUSH 100 UNIT/ML IV SOLN
500.0000 [IU] | Freq: Once | INTRAVENOUS | Status: AC | PRN
  Administered 2014-08-22: 500 [IU]
  Filled 2014-08-22: qty 5

## 2014-08-22 MED ORDER — PROMETHAZINE HCL 25 MG/ML IJ SOLN
12.5000 mg | Freq: Every day | INTRAMUSCULAR | Status: DC | PRN
  Filled 2014-08-22: qty 1

## 2014-08-22 NOTE — Patient Instructions (Signed)

## 2014-08-22 NOTE — Telephone Encounter (Signed)
Wife called to say patient is much weaker, losing appetite, legs more swollen and is confused. Thinks patient is dehydrated.

## 2014-08-22 NOTE — Telephone Encounter (Signed)
Add labs per 09/01 POF at 1:30 pt aware...Marland KitchenMarland KitchenKJ

## 2014-08-22 NOTE — Progress Notes (Signed)
CBC and U/A results discussed with Dr. Alvy Bimler.  Verbal order to hold Revlimid  For now until return appointment on Friday. Pt and pt's wife instructed on holding   Revelimid.

## 2014-08-22 NOTE — Patient Instructions (Signed)

## 2014-08-23 LAB — URINE CULTURE

## 2014-08-24 ENCOUNTER — Other Ambulatory Visit: Payer: Self-pay | Admitting: *Deleted

## 2014-08-24 DIAGNOSIS — C9002 Multiple myeloma in relapse: Secondary | ICD-10-CM

## 2014-08-24 MED ORDER — LENALIDOMIDE 25 MG PO CAPS: 25.0000 mg | ORAL_CAPSULE | Freq: Every day | ORAL | Status: DC

## 2014-08-25 ENCOUNTER — Ambulatory Visit (HOSPITAL_BASED_OUTPATIENT_CLINIC_OR_DEPARTMENT_OTHER): Payer: Medicare Other | Admitting: Hematology and Oncology

## 2014-08-25 ENCOUNTER — Ambulatory Visit (HOSPITAL_BASED_OUTPATIENT_CLINIC_OR_DEPARTMENT_OTHER): Payer: Medicare Other

## 2014-08-25 ENCOUNTER — Telehealth: Payer: Self-pay | Admitting: Hematology and Oncology

## 2014-08-25 ENCOUNTER — Other Ambulatory Visit (HOSPITAL_BASED_OUTPATIENT_CLINIC_OR_DEPARTMENT_OTHER): Payer: Medicare Other

## 2014-08-25 ENCOUNTER — Encounter: Payer: Self-pay | Admitting: Hematology and Oncology

## 2014-08-25 VITALS — BP 122/55 | HR 68 | Temp 98.4°F | Resp 18 | Ht 71.0 in | Wt 229.1 lb

## 2014-08-25 DIAGNOSIS — N182 Chronic kidney disease, stage 2 (mild): Secondary | ICD-10-CM

## 2014-08-25 DIAGNOSIS — N189 Chronic kidney disease, unspecified: Secondary | ICD-10-CM

## 2014-08-25 DIAGNOSIS — Z00129 Encounter for routine child health examination without abnormal findings: Secondary | ICD-10-CM

## 2014-08-25 DIAGNOSIS — Z23 Encounter for immunization: Secondary | ICD-10-CM

## 2014-08-25 DIAGNOSIS — R609 Edema, unspecified: Secondary | ICD-10-CM

## 2014-08-25 DIAGNOSIS — D6959 Other secondary thrombocytopenia: Secondary | ICD-10-CM

## 2014-08-25 DIAGNOSIS — C9002 Multiple myeloma in relapse: Secondary | ICD-10-CM

## 2014-08-25 DIAGNOSIS — N183 Chronic kidney disease, stage 3 unspecified: Secondary | ICD-10-CM

## 2014-08-25 DIAGNOSIS — D630 Anemia in neoplastic disease: Secondary | ICD-10-CM

## 2014-08-25 DIAGNOSIS — T451X5A Adverse effect of antineoplastic and immunosuppressive drugs, initial encounter: Secondary | ICD-10-CM

## 2014-08-25 DIAGNOSIS — D72819 Decreased white blood cell count, unspecified: Secondary | ICD-10-CM

## 2014-08-25 DIAGNOSIS — R600 Localized edema: Secondary | ICD-10-CM

## 2014-08-25 DIAGNOSIS — Z452 Encounter for adjustment and management of vascular access device: Secondary | ICD-10-CM

## 2014-08-25 DIAGNOSIS — D701 Agranulocytosis secondary to cancer chemotherapy: Secondary | ICD-10-CM

## 2014-08-25 DIAGNOSIS — T50905A Adverse effect of unspecified drugs, medicaments and biological substances, initial encounter: Secondary | ICD-10-CM

## 2014-08-25 DIAGNOSIS — R197 Diarrhea, unspecified: Secondary | ICD-10-CM

## 2014-08-25 DIAGNOSIS — Z95828 Presence of other vascular implants and grafts: Secondary | ICD-10-CM

## 2014-08-25 DIAGNOSIS — IMO0002 Reserved for concepts with insufficient information to code with codable children: Secondary | ICD-10-CM

## 2014-08-25 DIAGNOSIS — Z5112 Encounter for antineoplastic immunotherapy: Secondary | ICD-10-CM

## 2014-08-25 LAB — CBC WITH DIFFERENTIAL/PLATELET
BASO%: 0.7 % (ref 0.0–2.0)
BASOS ABS: 0 10*3/uL (ref 0.0–0.1)
EOS%: 1.8 % (ref 0.0–7.0)
Eosinophils Absolute: 0.1 10*3/uL (ref 0.0–0.5)
HCT: 29.3 % — ABNORMAL LOW (ref 38.4–49.9)
HGB: 10 g/dL — ABNORMAL LOW (ref 13.0–17.1)
LYMPH%: 40.2 % (ref 14.0–49.0)
MCH: 32.9 pg (ref 27.2–33.4)
MCHC: 34.1 g/dL (ref 32.0–36.0)
MCV: 96.4 fL (ref 79.3–98.0)
MONO#: 0.3 10*3/uL (ref 0.1–0.9)
MONO%: 10.3 % (ref 0.0–14.0)
NEUT#: 1.3 10*3/uL — ABNORMAL LOW (ref 1.5–6.5)
NEUT%: 47 % (ref 39.0–75.0)
Platelets: 52 10*3/uL — ABNORMAL LOW (ref 140–400)
RBC: 3.04 10*6/uL — AB (ref 4.20–5.82)
RDW: 14.8 % — ABNORMAL HIGH (ref 11.0–14.6)
WBC: 2.8 10*3/uL — AB (ref 4.0–10.3)
lymph#: 1.1 10*3/uL (ref 0.9–3.3)

## 2014-08-25 LAB — TECHNOLOGIST REVIEW

## 2014-08-25 LAB — COMPREHENSIVE METABOLIC PANEL (CC13)
ALBUMIN: 2.8 g/dL — AB (ref 3.5–5.0)
ALT: 14 U/L (ref 0–55)
ANION GAP: 7 meq/L (ref 3–11)
AST: 13 U/L (ref 5–34)
Alkaline Phosphatase: 89 U/L (ref 40–150)
BILIRUBIN TOTAL: 1.12 mg/dL (ref 0.20–1.20)
BUN: 13 mg/dL (ref 7.0–26.0)
CO2: 24 meq/L (ref 22–29)
Calcium: 8 mg/dL — ABNORMAL LOW (ref 8.4–10.4)
Chloride: 109 mEq/L (ref 98–109)
Creatinine: 1.4 mg/dL — ABNORMAL HIGH (ref 0.7–1.3)
Glucose: 154 mg/dl — ABNORMAL HIGH (ref 70–140)
POTASSIUM: 3.7 meq/L (ref 3.5–5.1)
SODIUM: 140 meq/L (ref 136–145)
Total Protein: 4.9 g/dL — ABNORMAL LOW (ref 6.4–8.3)

## 2014-08-25 MED ORDER — DIPHENHYDRAMINE HCL 25 MG PO CAPS
50.0000 mg | ORAL_CAPSULE | Freq: Once | ORAL | Status: AC
  Administered 2014-08-25: 50 mg via ORAL

## 2014-08-25 MED ORDER — DEXAMETHASONE SODIUM PHOSPHATE 10 MG/ML IJ SOLN
INTRAMUSCULAR | Status: AC
  Filled 2014-08-25: qty 1

## 2014-08-25 MED ORDER — SODIUM CHLORIDE 0.9 % IV SOLN: Freq: Once | INTRAVENOUS | Status: DC

## 2014-08-25 MED ORDER — INFLUENZA VAC SPLIT QUAD 0.5 ML IM SUSY
0.5000 mL | PREFILLED_SYRINGE | Freq: Once | INTRAMUSCULAR | Status: AC
  Administered 2014-08-25: 0.5 mL via INTRAMUSCULAR
  Filled 2014-08-25: qty 0.5

## 2014-08-25 MED ORDER — FAMOTIDINE IN NACL 20-0.9 MG/50ML-% IV SOLN
20.0000 mg | Freq: Once | INTRAVENOUS | Status: AC
  Administered 2014-08-25: 20 mg via INTRAVENOUS

## 2014-08-25 MED ORDER — ACETAMINOPHEN 325 MG PO TABS
650.0000 mg | ORAL_TABLET | Freq: Once | ORAL | Status: AC
  Administered 2014-08-25: 650 mg via ORAL

## 2014-08-25 MED ORDER — DIPHENHYDRAMINE HCL 25 MG PO CAPS
ORAL_CAPSULE | ORAL | Status: AC
  Filled 2014-08-25: qty 2

## 2014-08-25 MED ORDER — DEXAMETHASONE SODIUM PHOSPHATE 10 MG/ML IJ SOLN
8.0000 mg | Freq: Once | INTRAMUSCULAR | Status: AC
  Administered 2014-08-25: 8 mg via INTRAVENOUS

## 2014-08-25 MED ORDER — SODIUM CHLORIDE 0.9 % IJ SOLN
10.0000 mL | INTRAMUSCULAR | Status: DC | PRN
  Administered 2014-08-25: 10 mL
  Filled 2014-08-25: qty 10

## 2014-08-25 MED ORDER — FAMOTIDINE IN NACL 20-0.9 MG/50ML-% IV SOLN
INTRAVENOUS | Status: AC
  Filled 2014-08-25: qty 50

## 2014-08-25 MED ORDER — SODIUM CHLORIDE 0.9 % IJ SOLN
10.0000 mL | INTRAMUSCULAR | Status: DC | PRN
  Administered 2014-08-25: 10 mL via INTRAVENOUS
  Filled 2014-08-25: qty 10

## 2014-08-25 MED ORDER — SODIUM CHLORIDE 0.9 % IV SOLN
10.0000 mg/kg | Freq: Once | INTRAVENOUS | Status: AC
  Administered 2014-08-25: 1055 mg via INTRAVENOUS
  Filled 2014-08-25: qty 42.2

## 2014-08-25 MED ORDER — ACETAMINOPHEN 325 MG PO TABS
ORAL_TABLET | ORAL | Status: AC
  Filled 2014-08-25: qty 2

## 2014-08-25 MED ORDER — HEPARIN SOD (PORK) LOCK FLUSH 100 UNIT/ML IV SOLN
500.0000 [IU] | Freq: Once | INTRAVENOUS | Status: DC | PRN
  Filled 2014-08-25: qty 5

## 2014-08-25 NOTE — Telephone Encounter (Signed)
RECEIVED A FAX FROM [facility] CONCERNING A CONFIRMATION OF FACSIMILE RECEIPT FOR PT. REFERRAL. 

## 2014-08-25 NOTE — Assessment & Plan Note (Signed)
As mentioned above, I will hold Revlimid for now. So far, he has no bleeding complications.

## 2014-08-25 NOTE — Telephone Encounter (Signed)
gv and printed appt sched and avs for pt for Sept and OCT °

## 2014-08-25 NOTE — Progress Notes (Signed)
OK to treat per research nurse, Stanton Kidney.

## 2014-08-25 NOTE — Assessment & Plan Note (Signed)
I suspect this is due to lactose intolerance. I recommended avoidance of milk products. He can take Imodium as needed.

## 2014-08-25 NOTE — Assessment & Plan Note (Signed)
Kidney function is stable today. I will monitor closely

## 2014-08-25 NOTE — Patient Instructions (Signed)
[place] Discharge Instructions for Patients Receiving Chemotherapy  Today you received the following chemotherapy agents: Elotuzumab  To help prevent nausea and vomiting after your treatment, we encourage you to take your nausea medication: Zofran 8 mg every 8 hours as needed.   If you develop nausea and vomiting that is not controlled by your nausea medication, call the clinic.   BELOW ARE SYMPTOMS THAT SHOULD BE REPORTED IMMEDIATELY:  *FEVER GREATER THAN 100.5 F  *CHILLS WITH OR WITHOUT FEVER  NAUSEA AND VOMITING THAT IS NOT CONTROLLED WITH YOUR NAUSEA MEDICATION  *UNUSUAL SHORTNESS OF BREATH  *UNUSUAL BRUISING OR BLEEDING  TENDERNESS IN MOUTH AND THROAT WITH OR WITHOUT PRESENCE OF ULCERS  *URINARY PROBLEMS  *BOWEL PROBLEMS  UNUSUAL RASH Items with * indicate a potential emergency and should be followed up as soon as possible.  Feel free to call the clinic you have any questions or concerns. The clinic phone number is [phone].

## 2014-08-25 NOTE — Assessment & Plan Note (Signed)
This is due to to fluid retention from poor mobility. I recommend leg elevation and exercise with physical therapy.

## 2014-08-25 NOTE — Assessment & Plan Note (Signed)
We discussed the importance of preventive care and reviewed the vaccination programs. He does not have any prior allergic reactions to influenza vaccination. He agrees to proceed with influenza vaccination today and we will administer it today at the clinic.  

## 2014-08-25 NOTE — Assessment & Plan Note (Signed)
This is likely anemia of chronic disease. The patient denies recent history of bleeding such as epistaxis, hematuria or hematochezia. He is asymptomatic from the anemia. We will observe for now.  He does not require transfusion now.   

## 2014-08-25 NOTE — Patient Instructions (Signed)

## 2014-08-25 NOTE — Assessment & Plan Note (Signed)
This is improving. I will proceed with chemotherapy but continue hold Revlimid.

## 2014-08-25 NOTE — Progress Notes (Signed)
[facility] OFFICE PROGRESS NOTE  Patient Care Team: Kandice Hams, MD as PCP - General (Internal Medicine) Annia Belt, MD as Consulting Physician (Internal Medicine) Heath Lark, MD as Consulting Physician (Hematology and Oncology)  SUMMARY OF ONCOLOGIC HISTORY:  He was initially diagnosed in June 2010. He had a heavy myeloma burden at time of diagnosis with multiple lytic bone lesions, 87% plasma cells in the bone marrow, and 6.7 g of protein in the urine. 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 He went on to receive high-dose IV melphalan with autologous stem cell support at [facility] 03/12/2010. We began to see small amounts of monoclonal lambda free light chains again in his urine in March of 2012. He was started on Revlimid 10 mg daily beginning in April 2012. Dose had to be adjusted due to myelosuppression down to 5 mg. At time of a visit here in December 2013, he was complaining of recurrent pain in his shoulders, bilateral ribs, and low back. Lab showed rise in his serum and urine paraprotein levels. He was restaged with a bone marrow biopsy on 12/31/2012 which showed 10% plasma cells, rise in total urine protein from 260 mg to 1039 mg, and rise in serum free lambda light chains from 4.8 mg percent in July 2013,to 10 mg percent on October 29, to a 25.8 mg percent by December 16, to 58.5 mg percent by 01/07/2013. He was then started him on pomalidomide on 01/28/2013. Initial dose 3 mg 21 days on 7 days rest.  He tolerated the drug well. Unfortunately, at time of his visit here on August 2014 he appeared to be breaking through the pomalidomide. Although his hematologic profile remained stable, there was a progressive rise in the lambda free light chains up to 116 mg percent as of 07/26/2013 compared with 58 on May 28. 24 hour urine total protein back up to 6645 mg. After a lengthy discussion  with respect to treatment options, he was started on trial of Bendamustine plus prednisone. This was started on September 4. He had 6 cycles through  02/01/2014. He appeared to have a initial improvement with fall in his serum lambda free light chains from 116 mg percent down to 91 by November 2014, fall in 24 hour urine protein from 6.6 g down to 5.4, fall in his creatinine from 1.4 down to 1.1. Unfortunately, recent reevaluation done on February 01, 2014 shows rise in the serum light chains up to 261 mg percent, urine 24-hour protein up to 14.4 g, and creatinine up to 1.3.  On 02/28/2014, he was started on a trial of kyprolis. Repeat serum lambda light chain on 04/10/2014 showed that the patient is responding to treatment. On 06/20/2014, repeat serum light chain is getting worse and treatment was discontinued. The patient is enrolled in clinical trial and will begin first dose of chemotherapy using combination dexamethasone, Revlimid and Elotuzumab on 07/07/2014.   INTERVAL HISTORY: Please see below for problem oriented charting. He is seen prior to cycle 3 of treatment. His wife noticed like of motivation and he is not working with physical therapy. He had recurrent falls home with no major injuries. He continued to have significant bilateral leg edema. The patient denies any recent signs or symptoms of bleeding such as spontaneous epistaxis, hematuria or hematochezia. He had recurrent diarrhea at home.  REVIEW OF SYSTEMS:   Constitutional: Denies fevers, chills or abnormal  weight loss Eyes: Denies blurriness of vision Ears, nose, mouth, throat, and face: Denies mucositis or sore throat Respiratory: Denies cough, dyspnea or wheezes Cardiovascular: Denies palpitation, chest discomfort Skin: Denies abnormal skin rashes Lymphatics: Denies new lymphadenopathy or easy bruising Neurological:Denies numbness, tingling or new weaknesses Behavioral/Psych: Mood is stable, no new changes  All other  systems were reviewed with the patient and are negative.  I have reviewed the past medical history, past surgical history, social history and family history with the patient and they are unchanged from previous note.  ALLERGIES:  has No Known Allergies.  MEDICATIONS:  Current Outpatient Prescriptions  Medication Sig Dispense Refill  . acetaminophen (TYLENOL) 500 MG tablet Take 500 mg by mouth every 6 (six) hours as needed. For pain.      Marland Kitchen aspirin EC 81 MG tablet Take 81 mg by mouth daily after breakfast.       . atorvastatin (LIPITOR) 10 MG tablet Take 10 mg by mouth at bedtime.        . BD ULTRA-FINE PEN NEEDLES 29G X 12.7MM MISC Inject 30 Units into the skin daily.       . carbidopa-levodopa (SINEMET IR) 25-100 MG per tablet TAKE 1 TABLET 3 TIMES A DAY  90 tablet  6  . dexamethasone (DECADRON) 4 MG tablet Take 7 tablets (28 mg total) by mouth once a week.  100 tablet  1  . Insulin Glargine (LANTUS SOLOSTAR Tenino) Inject 20-30 Units into the skin every evening.       . lidocaine-prilocaine (EMLA) cream Apply 1 application topically as needed. Apply to port at least 1 hour before procedure & cover.  30 g  1  . morphine (MSIR) 15 MG tablet Take 1 tablet (15 mg total) by mouth every 4 (four) hours as needed for severe pain.  90 tablet  0  . nystatin cream (MYCOSTATIN)       . ondansetron (ZOFRAN-ODT) 8 MG disintegrating tablet Take 8 mg by mouth every 8 (eight) hours as needed for nausea or vomiting.      . polyethylene glycol (MIRALAX / GLYCOLAX) packet Take 17 g by mouth daily after breakfast.       . pregabalin (LYRICA) 100 MG capsule Take 1 capsule (100 mg total) by mouth 2 (two) times daily.  60 capsule  6  . lenalidomide (REVLIMID) 25 MG capsule Take 1 capsule (25 mg total) by mouth daily.  21 capsule  0  . morphine (MS CONTIN) 30 MG 12 hr tablet Take 1 tablet (30 mg total) by mouth every 12 (twelve) hours.  60 tablet  0  . valACYclovir (VALTREX) 500 MG tablet Take 1 tablet (500 mg total)  by mouth daily. Takes once daily.  90 tablet  3   No current facility-administered medications for this visit.   Facility-Administered Medications Ordered in Other Visits  Medication Dose Route Frequency Provider Last Rate Last Dose  . sodium chloride 0.9 % injection 10 mL  10 mL Intravenous PRN Heath Lark, MD   10 mL at 05/09/14 1045    PHYSICAL EXAMINATION: ECOG PERFORMANCE STATUS: 2 - Symptomatic, <50% confined to bed  Filed Vitals:   08/25/14 0946  BP: 122/55  Pulse: 68  Temp: 98.4 F (36.9 C)  Resp: 18   Filed Weights   08/25/14 0946  Weight: 229 lb 1.6 oz (103.919 kg)    GENERAL:alert, no distress and comfortable SKIN: skin color, texture, turgor are normal, no rashes or significant lesions. Noted type skin around  his leg with a small blister. EYES: normal, Conjunctiva are pink and non-injected, sclera clear OROPHARYNX:no exudate, no erythema and lips, buccal mucosa, and tongue normal  NECK: supple, thyroid normal size, non-tender, without nodularity LYMPH:  no palpable lymphadenopathy in the cervical, axillary or inguinal LUNGS: clear to auscultation and percussion with normal breathing effort HEART: regular rate & rhythm and no murmurs with moderate bilateral lower extremity edema ABDOMEN:abdomen soft, non-tender and normal bowel sounds Musculoskeletal:no cyanosis of digits and no clubbing  NEURO: alert & oriented x 3 with fluent speech, no focal motor/sensory deficits  LABORATORY DATA:  I have reviewed the data as listed    Component Value Date/Time   NA 140 08/25/2014 0927   NA 138 12/16/2013 1350   NA 145 06/13/2009 1503   K 3.7 08/25/2014 0927   K 3.6 12/16/2013 1350   K 4.8* 06/13/2009 1503   CL 104 12/16/2013 1350   CL 104 05/18/2013 1113   CL 108 06/13/2009 1503   CO2 24 08/25/2014 0927   CO2 24 12/16/2013 1350   CO2 29 06/13/2009 1503   GLUCOSE 154* 08/25/2014 0927   GLUCOSE 192* 12/16/2013 1350   GLUCOSE 325* 05/18/2013 1113   GLUCOSE 114 06/13/2009 1503    BUN 13.0 08/25/2014 0927   BUN 10 12/16/2013 1350   BUN 49* 06/13/2009 1503   CREATININE 1.4* 08/25/2014 0927   CREATININE 1.28 12/16/2013 1350   CREATININE 1.33 07/22/2012 1334   CREATININE 2.9* 06/13/2009 1503   CALCIUM 8.0* 08/25/2014 0927   CALCIUM 7.7* 12/16/2013 1350   CALCIUM 10.5* 06/13/2009 1503   PROT 4.9* 08/25/2014 0927   PROT 5.1* 12/16/2013 1350   PROT 6.4 06/13/2009 1503   ALBUMIN 2.8* 08/25/2014 0927   ALBUMIN 2.9* 12/16/2013 1350   AST 13 08/25/2014 0927   AST 26 12/16/2013 1350   AST 21 06/13/2009 1503   ALT 14 08/25/2014 0927   ALT <5 12/16/2013 1350   ALT 25 06/13/2009 1503   ALKPHOS 89 08/25/2014 0927   ALKPHOS 109 12/16/2013 1350   ALKPHOS 135* 06/13/2009 1503   BILITOT 1.12 08/25/2014 0927   BILITOT 0.5 12/16/2013 1350   BILITOT 0.60 06/13/2009 1503   GFRNONAA 54* 12/16/2013 1350   GFRAA 62* 12/16/2013 1350    No results found for this basename: SPEP,  UPEP,   kappa and lambda light chains    Lab Results  Component Value Date   WBC 2.8* 08/25/2014   NEUTROABS 1.3* 08/25/2014   HGB 10.0* 08/25/2014   HCT 29.3* 08/25/2014   MCV 96.4 08/25/2014   PLT 52* 08/25/2014      Chemistry      Component Value Date/Time   NA 140 08/25/2014 0927   NA 138 12/16/2013 1350   NA 145 06/13/2009 1503   K 3.7 08/25/2014 0927   K 3.6 12/16/2013 1350   K 4.8* 06/13/2009 1503   CL 104 12/16/2013 1350   CL 104 05/18/2013 1113   CL 108 06/13/2009 1503   CO2 24 08/25/2014 0927   CO2 24 12/16/2013 1350   CO2 29 06/13/2009 1503   BUN 13.0 08/25/2014 0927   BUN 10 12/16/2013 1350   BUN 49* 06/13/2009 1503   CREATININE 1.4* 08/25/2014 0927   CREATININE 1.28 12/16/2013 1350   CREATININE 1.33 07/22/2012 1334   CREATININE 2.9* 06/13/2009 1503      Component Value Date/Time   CALCIUM 8.0* 08/25/2014 0927   CALCIUM 7.7* 12/16/2013 1350   CALCIUM 10.5* 06/13/2009 1503  ALKPHOS 89 08/25/2014 0927   ALKPHOS 109 12/16/2013 1350   ALKPHOS 135* 06/13/2009 1503   AST 13 08/25/2014 0927   AST 26 12/16/2013 1350   AST 21  06/13/2009 1503   ALT 14 08/25/2014 0927   ALT <5 12/16/2013 1350   ALT 25 06/13/2009 1503   BILITOT 1.12 08/25/2014 0927   BILITOT 0.5 12/16/2013 1350   BILITOT 0.60 06/13/2009 1503    ASSESSMENT & PLAN:  Multiple myeloma in relapse Recent repeat light chains an M spike, improving. The patient tolerated treatment poorly due to coexisting comorbidities. Due to persistent severe thrombocytopenia, I recommend holding Revlimid but to proceed with chemotherapy. I will see him back in 2 weeks for further assessment  CRI (chronic renal insufficiency) Kidney function is stable today. I will monitor closely        Leg edema This is due to to fluid retention from poor mobility. I recommend leg elevation and exercise with physical therapy.  Anemia in neoplastic disease This is likely anemia of chronic disease. The patient denies recent history of bleeding such as epistaxis, hematuria or hematochezia. He is asymptomatic from the anemia. We will observe for now.  He does not require transfusion now.            Thrombocytopenia due to drugs As mentioned above, I will hold Revlimid for now. So far, he has no bleeding complications.    Leukopenia due to antineoplastic chemotherapy This is improving. I will proceed with chemotherapy but continue hold Revlimid.  Diarrhea I suspect this is due to lactose intolerance. I recommended avoidance of milk products. He can take Imodium as needed.  Encounter for routine preventive care for patient older than 28 days We discussed the importance of preventive care and reviewed the vaccination programs. He does not have any prior allergic reactions to influenza vaccination. He agrees to proceed with influenza vaccination today and we will administer it today at the clinic.      No orders of the defined types were placed in this encounter.   All questions were answered. The patient knows to call the clinic with any problems, questions or  concerns. No barriers to learning was detected. I spent 30 minutes counseling the patient face to face. The total time spent in the appointment was 40 minutes and more than 50% was on counseling and review of test results     [facility], [place], MD 08/25/2014 10:16 AM

## 2014-08-25 NOTE — Assessment & Plan Note (Signed)
Recent repeat light chains an M spike, improving. The patient tolerated treatment poorly due to coexisting comorbidities. Due to persistent severe thrombocytopenia, I recommend holding Revlimid but to proceed with chemotherapy. I will see him back in 2 weeks for further assessment

## 2014-08-30 ENCOUNTER — Telehealth: Payer: Self-pay | Admitting: *Deleted

## 2014-08-30 NOTE — Telephone Encounter (Signed)
Call from [facility].  They wanted to confirm pt's Revlmid on hold right now.  He has 9 pills at home currently and when MD restarts the Revlimid then they will just need verbal ok to dispense #12 pills to equal total #21 for next cycle.  Informed them Revlimid is on hold until pt sees Dr. Alvy Bimler again on 9/18.  We will call or send new rx at that time.

## 2014-08-31 ENCOUNTER — Other Ambulatory Visit: Payer: Self-pay | Admitting: Hematology and Oncology

## 2014-09-01 ENCOUNTER — Ambulatory Visit (HOSPITAL_BASED_OUTPATIENT_CLINIC_OR_DEPARTMENT_OTHER): Payer: Medicare Other

## 2014-09-01 ENCOUNTER — Encounter: Payer: Self-pay | Admitting: *Deleted

## 2014-09-01 ENCOUNTER — Ambulatory Visit: Payer: Medicare Other

## 2014-09-01 ENCOUNTER — Other Ambulatory Visit (HOSPITAL_BASED_OUTPATIENT_CLINIC_OR_DEPARTMENT_OTHER): Payer: Medicare Other

## 2014-09-01 VITALS — BP 134/53 | HR 78 | Temp 97.5°F | Resp 20 | Ht 71.0 in | Wt 227.0 lb

## 2014-09-01 DIAGNOSIS — C9002 Multiple myeloma in relapse: Secondary | ICD-10-CM

## 2014-09-01 DIAGNOSIS — Z95828 Presence of other vascular implants and grafts: Secondary | ICD-10-CM

## 2014-09-01 DIAGNOSIS — N182 Chronic kidney disease, stage 2 (mild): Secondary | ICD-10-CM

## 2014-09-01 DIAGNOSIS — Z5112 Encounter for antineoplastic immunotherapy: Secondary | ICD-10-CM

## 2014-09-01 DIAGNOSIS — IMO0002 Reserved for concepts with insufficient information to code with codable children: Secondary | ICD-10-CM

## 2014-09-01 LAB — COMPREHENSIVE METABOLIC PANEL (CC13)
ALK PHOS: 88 U/L (ref 40–150)
AST: 16 U/L (ref 5–34)
Albumin: 3 g/dL — ABNORMAL LOW (ref 3.5–5.0)
Anion Gap: 9 mEq/L (ref 3–11)
BILIRUBIN TOTAL: 0.81 mg/dL (ref 0.20–1.20)
BUN: 14.9 mg/dL (ref 7.0–26.0)
CALCIUM: 8.5 mg/dL (ref 8.4–10.4)
CO2: 24 mEq/L (ref 22–29)
CREATININE: 1.4 mg/dL — AB (ref 0.7–1.3)
Chloride: 109 mEq/L (ref 98–109)
Glucose: 154 mg/dl — ABNORMAL HIGH (ref 70–140)
Potassium: 4 mEq/L (ref 3.5–5.1)
Sodium: 142 mEq/L (ref 136–145)
Total Protein: 5.2 g/dL — ABNORMAL LOW (ref 6.4–8.3)

## 2014-09-01 LAB — CBC WITH DIFFERENTIAL/PLATELET
BASO%: 1.1 % (ref 0.0–2.0)
BASOS ABS: 0 10*3/uL (ref 0.0–0.1)
EOS%: 1.1 % (ref 0.0–7.0)
Eosinophils Absolute: 0 10*3/uL (ref 0.0–0.5)
HCT: 29 % — ABNORMAL LOW (ref 38.4–49.9)
HEMOGLOBIN: 9.8 g/dL — AB (ref 13.0–17.1)
LYMPH%: 38.6 % (ref 14.0–49.0)
MCH: 33.2 pg (ref 27.2–33.4)
MCHC: 33.8 g/dL (ref 32.0–36.0)
MCV: 98.3 fL — AB (ref 79.3–98.0)
MONO#: 0.6 10*3/uL (ref 0.1–0.9)
MONO%: 16.5 % — AB (ref 0.0–14.0)
NEUT#: 1.6 10*3/uL (ref 1.5–6.5)
NEUT%: 42.7 % (ref 39.0–75.0)
Platelets: 70 10*3/uL — ABNORMAL LOW (ref 140–400)
RBC: 2.95 10*6/uL — ABNORMAL LOW (ref 4.20–5.82)
RDW: 15 % — ABNORMAL HIGH (ref 11.0–14.6)
WBC: 3.6 10*3/uL — ABNORMAL LOW (ref 4.0–10.3)
lymph#: 1.4 10*3/uL (ref 0.9–3.3)

## 2014-09-01 LAB — TECHNOLOGIST REVIEW

## 2014-09-01 MED ORDER — ACETAMINOPHEN 325 MG PO TABS
ORAL_TABLET | ORAL | Status: AC
  Filled 2014-09-01: qty 2

## 2014-09-01 MED ORDER — DIPHENHYDRAMINE HCL 25 MG PO CAPS
ORAL_CAPSULE | ORAL | Status: AC
  Filled 2014-09-01: qty 2

## 2014-09-01 MED ORDER — DEXAMETHASONE SODIUM PHOSPHATE 10 MG/ML IJ SOLN
8.0000 mg | Freq: Once | INTRAMUSCULAR | Status: AC
  Administered 2014-09-01: 8 mg via INTRAVENOUS

## 2014-09-01 MED ORDER — SODIUM CHLORIDE 0.9 % IV SOLN
Freq: Once | INTRAVENOUS | Status: AC
  Administered 2014-09-01: 10:00:00 via INTRAVENOUS

## 2014-09-01 MED ORDER — INV-ELOTUZUMAB CHEMO INJECTION 400MG BMS CA204-143
10.0000 mg/kg | Freq: Once | INTRAMUSCULAR | Status: AC
  Administered 2014-09-01: 1030 mg via INTRAVENOUS
  Filled 2014-09-01: qty 41.2

## 2014-09-01 MED ORDER — SODIUM CHLORIDE 0.9 % IJ SOLN
10.0000 mL | INTRAMUSCULAR | Status: DC | PRN
  Administered 2014-09-01: 10 mL via INTRAVENOUS
  Filled 2014-09-01: qty 10

## 2014-09-01 MED ORDER — FAMOTIDINE IN NACL 20-0.9 MG/50ML-% IV SOLN
INTRAVENOUS | Status: AC
  Filled 2014-09-01: qty 50

## 2014-09-01 MED ORDER — DIPHENHYDRAMINE HCL 25 MG PO CAPS
50.0000 mg | ORAL_CAPSULE | Freq: Once | ORAL | Status: AC
  Administered 2014-09-01: 50 mg via ORAL

## 2014-09-01 MED ORDER — DEXAMETHASONE SODIUM PHOSPHATE 10 MG/ML IJ SOLN
INTRAMUSCULAR | Status: AC
  Filled 2014-09-01: qty 1

## 2014-09-01 MED ORDER — ACETAMINOPHEN 325 MG PO TABS
650.0000 mg | ORAL_TABLET | Freq: Once | ORAL | Status: AC
  Administered 2014-09-01: 650 mg via ORAL

## 2014-09-01 MED ORDER — HEPARIN SOD (PORK) LOCK FLUSH 100 UNIT/ML IV SOLN
500.0000 [IU] | Freq: Once | INTRAVENOUS | Status: AC | PRN
  Administered 2014-09-01: 500 [IU]
  Filled 2014-09-01: qty 5

## 2014-09-01 MED ORDER — FAMOTIDINE IN NACL 20-0.9 MG/50ML-% IV SOLN
20.0000 mg | Freq: Once | INTRAVENOUS | Status: AC
  Administered 2014-09-01: 20 mg via INTRAVENOUS

## 2014-09-01 MED ORDER — SODIUM CHLORIDE 0.9 % IJ SOLN
10.0000 mL | INTRAMUSCULAR | Status: DC | PRN
  Administered 2014-09-01: 10 mL
  Filled 2014-09-01: qty 10

## 2014-09-01 NOTE — Progress Notes (Signed)
09/01/2014 [facility] [id number]  Cycle 3  09/01/2014 Cycle 3, Day 1 [name] [name] is in the cancer center today for lab work and treatment with elotuzumab. His weight this morning is 102.96 KG. Dose of elotuzumab recalculated to 1030 mg.  His platelet count is below 75,000 so his revlimid will be held per Dr. Calton Dach instruction. [name] [name] is aware. [name] [name] took 20 mg decadron this morning at 6:30. Sign for infusion given to K. Flinn, RN with special attention to use of a filter, infusion rater of elotuzumab, and timing of pre-meds for elotuzumab.

## 2014-09-01 NOTE — Patient Instructions (Signed)
[facility] Discharge Instructions for Patients Receiving Chemotherapy  Today you received the following chemotherapy agents Elotuzumab.  To help prevent nausea and vomiting after your treatment, we encourage you to take your nausea medication as prescribed.   If you develop nausea and vomiting that is not controlled by your nausea medication, call the clinic.   BELOW ARE SYMPTOMS THAT SHOULD BE REPORTED IMMEDIATELY:  *FEVER GREATER THAN 100.5 F  *CHILLS WITH OR WITHOUT FEVER  NAUSEA AND VOMITING THAT IS NOT CONTROLLED WITH YOUR NAUSEA MEDICATION  *UNUSUAL SHORTNESS OF BREATH  *UNUSUAL BRUISING OR BLEEDING  TENDERNESS IN MOUTH AND THROAT WITH OR WITHOUT PRESENCE OF ULCERS  *URINARY PROBLEMS  *BOWEL PROBLEMS  UNUSUAL RASH Items with * indicate a potential emergency and should be followed up as soon as possible.  Feel free to call the clinic you have any questions or concerns. The clinic phone number is [phone].    

## 2014-09-01 NOTE — Patient Instructions (Signed)

## 2014-09-08 ENCOUNTER — Other Ambulatory Visit (HOSPITAL_BASED_OUTPATIENT_CLINIC_OR_DEPARTMENT_OTHER): Payer: Medicare Other

## 2014-09-08 ENCOUNTER — Other Ambulatory Visit: Payer: Medicare Other

## 2014-09-08 ENCOUNTER — Encounter: Payer: Self-pay | Admitting: Hematology and Oncology

## 2014-09-08 ENCOUNTER — Encounter: Payer: Self-pay | Admitting: *Deleted

## 2014-09-08 ENCOUNTER — Telehealth: Payer: Self-pay | Admitting: Hematology and Oncology

## 2014-09-08 ENCOUNTER — Ambulatory Visit (HOSPITAL_BASED_OUTPATIENT_CLINIC_OR_DEPARTMENT_OTHER): Payer: Medicare Other | Admitting: Hematology and Oncology

## 2014-09-08 ENCOUNTER — Ambulatory Visit (HOSPITAL_BASED_OUTPATIENT_CLINIC_OR_DEPARTMENT_OTHER): Payer: Medicare Other

## 2014-09-08 VITALS — BP 119/54 | HR 83 | Temp 98.5°F | Resp 18 | Ht 71.0 in | Wt 222.0 lb

## 2014-09-08 DIAGNOSIS — N183 Chronic kidney disease, stage 3 unspecified: Secondary | ICD-10-CM

## 2014-09-08 DIAGNOSIS — C9002 Multiple myeloma in relapse: Secondary | ICD-10-CM

## 2014-09-08 DIAGNOSIS — D630 Anemia in neoplastic disease: Secondary | ICD-10-CM

## 2014-09-08 DIAGNOSIS — D6959 Other secondary thrombocytopenia: Secondary | ICD-10-CM

## 2014-09-08 DIAGNOSIS — Z95828 Presence of other vascular implants and grafts: Secondary | ICD-10-CM

## 2014-09-08 DIAGNOSIS — T50905A Adverse effect of unspecified drugs, medicaments and biological substances, initial encounter: Secondary | ICD-10-CM

## 2014-09-08 DIAGNOSIS — E119 Type 2 diabetes mellitus without complications: Secondary | ICD-10-CM

## 2014-09-08 LAB — CBC WITH DIFFERENTIAL/PLATELET
BASO%: 0.9 % (ref 0.0–2.0)
BASOS ABS: 0 10*3/uL (ref 0.0–0.1)
EOS%: 1.4 % (ref 0.0–7.0)
Eosinophils Absolute: 0.1 10*3/uL (ref 0.0–0.5)
HEMATOCRIT: 30.6 % — AB (ref 38.4–49.9)
HEMOGLOBIN: 10.3 g/dL — AB (ref 13.0–17.1)
LYMPH#: 1.3 10*3/uL (ref 0.9–3.3)
LYMPH%: 32.7 % (ref 14.0–49.0)
MCH: 33.5 pg — ABNORMAL HIGH (ref 27.2–33.4)
MCHC: 33.6 g/dL (ref 32.0–36.0)
MCV: 99.7 fL — ABNORMAL HIGH (ref 79.3–98.0)
MONO#: 0.8 10*3/uL (ref 0.1–0.9)
MONO%: 20 % — ABNORMAL HIGH (ref 0.0–14.0)
NEUT#: 1.8 10*3/uL (ref 1.5–6.5)
NEUT%: 45 % (ref 39.0–75.0)
Platelets: 87 10*3/uL — ABNORMAL LOW (ref 140–400)
RBC: 3.07 10*6/uL — ABNORMAL LOW (ref 4.20–5.82)
RDW: 15.4 % — ABNORMAL HIGH (ref 11.0–14.6)
WBC: 4.1 10*3/uL (ref 4.0–10.3)

## 2014-09-08 LAB — COMPREHENSIVE METABOLIC PANEL (CC13)
ALT: <6 U/L (ref 0–55)
ANION GAP: 9 meq/L (ref 3–11)
AST: 18 U/L (ref 5–34)
Albumin: 3 g/dL — ABNORMAL LOW (ref 3.5–5.0)
Alkaline Phosphatase: 92 U/L (ref 40–150)
BUN: 16.9 mg/dL (ref 7.0–26.0)
CALCIUM: 8.7 mg/dL (ref 8.4–10.4)
CHLORIDE: 110 meq/L — AB (ref 98–109)
CO2: 25 meq/L (ref 22–29)
Creatinine: 1.6 mg/dL — ABNORMAL HIGH (ref 0.7–1.3)
GLUCOSE: 133 mg/dL (ref 70–140)
Potassium: 3.6 mEq/L (ref 3.5–5.1)
Sodium: 143 mEq/L (ref 136–145)
Total Bilirubin: 0.76 mg/dL (ref 0.20–1.20)
Total Protein: 5.5 g/dL — ABNORMAL LOW (ref 6.4–8.3)

## 2014-09-08 LAB — TECHNOLOGIST REVIEW

## 2014-09-08 MED ORDER — HEPARIN SOD (PORK) LOCK FLUSH 100 UNIT/ML IV SOLN
500.0000 [IU] | Freq: Once | INTRAVENOUS | Status: AC
  Administered 2014-09-08: 500 [IU] via INTRAVENOUS
  Filled 2014-09-08: qty 5

## 2014-09-08 MED ORDER — SODIUM CHLORIDE 0.9 % IJ SOLN
10.0000 mL | INTRAMUSCULAR | Status: DC | PRN
  Administered 2014-09-08: 10 mL via INTRAVENOUS
  Filled 2014-09-08: qty 10

## 2014-09-08 NOTE — Assessment & Plan Note (Signed)
Kidney function is stable today. I will monitor closely

## 2014-09-08 NOTE — Progress Notes (Signed)
[facility] OFFICE PROGRESS NOTE  Patient Care Team: Kandice Hams, MD as PCP - General (Internal Medicine) Annia Belt, MD as Consulting Physician (Internal Medicine) Heath Lark, MD as Consulting Physician (Hematology and Oncology)  SUMMARY OF ONCOLOGIC HISTORY:  He was initially diagnosed in June 2010. He had a heavy myeloma burden at time of diagnosis with multiple lytic bone lesions, 87% plasma cells in the bone marrow, and 6.7 g of protein in the urine. 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 He went on to receive high-dose IV melphalan with autologous stem cell support at [facility] 03/12/2010. We began to see small amounts of monoclonal lambda free light chains again in his urine in March of 2012. He was started on Revlimid 10 mg daily beginning in April 2012. Dose had to be adjusted due to myelosuppression down to 5 mg. At time of a visit here in December 2013, he was complaining of recurrent pain in his shoulders, bilateral ribs, and low back. Lab showed rise in his serum and urine paraprotein levels. He was restaged with a bone marrow biopsy on 12/31/2012 which showed 10% plasma cells, rise in total urine protein from 260 mg to 1039 mg, and rise in serum free lambda light chains from 4.8 mg percent in July 2013,to 10 mg percent on October 29, to a 25.8 mg percent by December 16, to 58.5 mg percent by 01/07/2013. He was then started him on pomalidomide on 01/28/2013. Initial dose 3 mg 21 days on 7 days rest.  He tolerated the drug well. Unfortunately, at time of his visit here on August 2014 he appeared to be breaking through the pomalidomide. Although his hematologic profile remained stable, there was a progressive rise in the lambda free light chains up to 116 mg percent as of 07/26/2013 compared with 58 on May 28. 24 hour urine total protein back up to 6645 mg. After a lengthy discussion  with respect to treatment options, he was started on trial of Bendamustine plus prednisone. This was started on September 4. He had 6 cycles through  02/01/2014. He appeared to have a initial improvement with fall in his serum lambda free light chains from 116 mg percent down to 91 by November 2014, fall in 24 hour urine protein from 6.6 g down to 5.4, fall in his creatinine from 1.4 down to 1.1. Unfortunately, recent reevaluation done on February 01, 2014 shows rise in the serum light chains up to 261 mg percent, urine 24-hour protein up to 14.4 g, and creatinine up to 1.3.  On 02/28/2014, he was started on a trial of kyprolis. Repeat serum lambda light chain on 04/10/2014 showed that the patient is responding to treatment. On 06/20/2014, repeat serum light chain is getting worse and treatment was discontinued. The patient is enrolled in clinical trial and will begin first dose of chemotherapy using combination dexamethasone, Revlimid and Elotuzumab on 07/07/2014.  INTERVAL HISTORY: Please see below for problem oriented charting. He feels well. His wife states that he is severely deconditioned. There was no recent falls. He has lost some fluid weight. His leg swelling has improved. His appetite remains poor. Denies any new bone pain.  REVIEW OF SYSTEMS:   Constitutional: Denies fevers, chills or abnormal weight loss Eyes: Denies blurriness of vision Ears, nose, mouth, throat, and face: Denies mucositis or sore throat Respiratory: Denies cough, dyspnea or wheezes Cardiovascular: Denies palpitation, chest  discomfort  Gastrointestinal:  Denies nausea, heartburn or change in bowel habits Skin: Denies abnormal skin rashes Lymphatics: Denies new lymphadenopathy or easy bruising Neurological:Denies numbness, tingling or new weaknesses Behavioral/Psych: Mood is stable, no new changes  All other systems were reviewed with the patient and are negative.  I have reviewed the past medical history, past  surgical history, social history and family history with the patient and they are unchanged from previous note.  ALLERGIES:  has No Known Allergies.  MEDICATIONS:  Current Outpatient Prescriptions  Medication Sig Dispense Refill  . acetaminophen (TYLENOL) 500 MG tablet Take 500 mg by mouth every 6 (six) hours as needed. For pain.      Marland Kitchen aspirin EC 81 MG tablet Take 81 mg by mouth daily after breakfast.       . atorvastatin (LIPITOR) 10 MG tablet Take 10 mg by mouth at bedtime.        . BD ULTRA-FINE PEN NEEDLES 29G X 12.7MM MISC Inject 30 Units into the skin daily.       . carbidopa-levodopa (SINEMET IR) 25-100 MG per tablet TAKE 1 TABLET 3 TIMES A DAY  90 tablet  6  . dexamethasone (DECADRON) 4 MG tablet Take 7 tablets (28 mg total) by mouth once a week.  100 tablet  1  . Insulin Glargine (LANTUS SOLOSTAR Idamay) Inject 20-30 Units into the skin every evening.       Marland Kitchen lenalidomide (REVLIMID) 25 MG capsule Take 1 capsule (25 mg total) by mouth daily.  21 capsule  0  . lidocaine-prilocaine (EMLA) cream Apply 1 application topically as needed. Apply to port at least 1 hour before procedure & cover.  30 g  1  . morphine (MS CONTIN) 30 MG 12 hr tablet Take 1 tablet (30 mg total) by mouth every 12 (twelve) hours.  60 tablet  0  . morphine (MSIR) 15 MG tablet Take 1 tablet (15 mg total) by mouth every 4 (four) hours as needed for severe pain.  90 tablet  0  . nystatin cream (MYCOSTATIN)       . ondansetron (ZOFRAN-ODT) 8 MG disintegrating tablet Take 8 mg by mouth every 8 (eight) hours as needed for nausea or vomiting.      . polyethylene glycol (MIRALAX / GLYCOLAX) packet Take 17 g by mouth daily after breakfast.       . pregabalin (LYRICA) 100 MG capsule Take 1 capsule (100 mg total) by mouth 2 (two) times daily.  60 capsule  6  . valACYclovir (VALTREX) 500 MG tablet Take 1 tablet (500 mg total) by mouth daily. Takes once daily.  90 tablet  3   No current facility-administered medications for this  visit.   Facility-Administered Medications Ordered in Other Visits  Medication Dose Route Frequency Provider Last Rate Last Dose  . sodium chloride 0.9 % injection 10 mL  10 mL Intravenous PRN Heath Lark, MD   10 mL at 05/09/14 1045    PHYSICAL EXAMINATION: ECOG PERFORMANCE STATUS: 2 - Symptomatic, <50% confined to bed  Filed Vitals:   09/08/14 1126  BP: 119/54  Pulse: 83  Temp: 98.5 F (36.9 C)  Resp: 18   Filed Weights   09/08/14 1126  Weight: 222 lb (100.699 kg)    GENERAL:alert, no distress and comfortable SKIN: skin color, texture, turgor are normal, no rashes or significant lesions EYES: normal, Conjunctiva are pink and non-injected, sclera clear OROPHARYNX:no exudate, no erythema and lips, buccal mucosa, and tongue normal  NECK: supple,  thyroid normal size, non-tender, without nodularity LYMPH:  no palpable lymphadenopathy in the cervical, axillary or inguinal LUNGS: clear to auscultation and percussion with normal breathing effort HEART: regular rate & rhythm and no murmurs with mild bilateral lower extremity edema ABDOMEN:abdomen soft, non-tender and normal bowel sounds Musculoskeletal:no cyanosis of digits and no clubbing  NEURO: alert & oriented x 3 with fluent speech, no focal motor/sensory deficits  LABORATORY DATA:  I have reviewed the data as listed    Component Value Date/Time   NA 143 09/08/2014 1050   NA 138 12/16/2013 1350   NA 145 06/13/2009 1503   K 3.6 09/08/2014 1050   K 3.6 12/16/2013 1350   K 4.8* 06/13/2009 1503   CL 104 12/16/2013 1350   CL 104 05/18/2013 1113   CL 108 06/13/2009 1503   CO2 25 09/08/2014 1050   CO2 24 12/16/2013 1350   CO2 29 06/13/2009 1503   GLUCOSE 133 09/08/2014 1050   GLUCOSE 192* 12/16/2013 1350   GLUCOSE 325* 05/18/2013 1113   GLUCOSE 114 06/13/2009 1503   BUN 16.9 09/08/2014 1050   BUN 10 12/16/2013 1350   BUN 49* 06/13/2009 1503   CREATININE 1.6* 09/08/2014 1050   CREATININE 1.28 12/16/2013 1350   CREATININE 1.33  07/22/2012 1334   CREATININE 2.9* 06/13/2009 1503   CALCIUM 8.7 09/08/2014 1050   CALCIUM 7.7* 12/16/2013 1350   CALCIUM 10.5* 06/13/2009 1503   PROT 5.5* 09/08/2014 1050   PROT 5.1* 12/16/2013 1350   PROT 6.4 06/13/2009 1503   ALBUMIN 3.0* 09/08/2014 1050   ALBUMIN 2.9* 12/16/2013 1350   AST 18 09/08/2014 1050   AST 26 12/16/2013 1350   AST 21 06/13/2009 1503   ALT <6 09/08/2014 1050   ALT <5 12/16/2013 1350   ALT 25 06/13/2009 1503   ALKPHOS 92 09/08/2014 1050   ALKPHOS 109 12/16/2013 1350   ALKPHOS 135* 06/13/2009 1503   BILITOT 0.76 09/08/2014 1050   BILITOT 0.5 12/16/2013 1350   BILITOT 0.60 06/13/2009 1503   GFRNONAA 54* 12/16/2013 1350   GFRAA 62* 12/16/2013 1350    No results found for this basename: SPEP,  UPEP,   kappa and lambda light chains    Lab Results  Component Value Date   WBC 4.1 09/08/2014   NEUTROABS 1.8 09/08/2014   HGB 10.3* 09/08/2014   HCT 30.6* 09/08/2014   MCV 99.7* 09/08/2014   PLT 87* 09/08/2014      Chemistry      Component Value Date/Time   NA 143 09/08/2014 1050   NA 138 12/16/2013 1350   NA 145 06/13/2009 1503   K 3.6 09/08/2014 1050   K 3.6 12/16/2013 1350   K 4.8* 06/13/2009 1503   CL 104 12/16/2013 1350   CL 104 05/18/2013 1113   CL 108 06/13/2009 1503   CO2 25 09/08/2014 1050   CO2 24 12/16/2013 1350   CO2 29 06/13/2009 1503   BUN 16.9 09/08/2014 1050   BUN 10 12/16/2013 1350   BUN 49* 06/13/2009 1503   CREATININE 1.6* 09/08/2014 1050   CREATININE 1.28 12/16/2013 1350   CREATININE 1.33 07/22/2012 1334   CREATININE 2.9* 06/13/2009 1503      Component Value Date/Time   CALCIUM 8.7 09/08/2014 1050   CALCIUM 7.7* 12/16/2013 1350   CALCIUM 10.5* 06/13/2009 1503   ALKPHOS 92 09/08/2014 1050   ALKPHOS 109 12/16/2013 1350   ALKPHOS 135* 06/13/2009 1503   AST 18 09/08/2014 1050   AST 26 12/16/2013 1350  AST 21 06/13/2009 1503   ALT <6 09/08/2014 1050   ALT <5 12/16/2013 1350   ALT 25 06/13/2009 1503   BILITOT 0.76 09/08/2014 1050   BILITOT 0.5 12/16/2013  1350   BILITOT 0.60 06/13/2009 1503     ASSESSMENT & PLAN:  Multiple myeloma in relapse Recent repeat light chains and M spike are improving. The patient tolerated treatment poorly due to coexisting comorbidities. Due to persistent severe thrombocytopenia, I recommend holding Revlimid but to proceed with chemotherapy. I will be restart today. I will see how his blood work performed next week. He may need Revlimid dose adjustment. Due to poorly controlled diabetes, I plan to reduce dexamethasone to 20 mg weekly by mouth.  Chronic kidney failure Kidney function is stable today. I will monitor closely    DM II (diabetes mellitus, type II), controlled His blood sugar was high due to high-dose dexamethasone. I would reduce dexamethasone for now. His most recent blood sugar appeared to be under control.  Thrombocytopenia due to drugs As mentioned above, I will restart Revlimid again. So far, he has no bleeding complications.  Anemia in neoplastic disease This is likely anemia of chronic disease. The patient denies recent history of bleeding such as epistaxis, hematuria or hematochezia. He is asymptomatic from the anemia. We will observe for now.  He does not require transfusion now.         Orders Placed This Encounter  Procedures  . Kappa/lambda light chains    Standing Status: Future     Number of Occurrences:      Standing Expiration Date: 10/13/2015   All questions were answered. The patient knows to call the clinic with any problems, questions or concerns. No barriers to learning was detected. I spent 30 minutes counseling the patient face to face. The total time spent in the appointment was 40 minutes and more than 50% was on counseling and review of test results     [facility], [place], MD 09/08/2014 4:41 PM

## 2014-09-08 NOTE — Patient Instructions (Signed)

## 2014-09-08 NOTE — Progress Notes (Unsigned)
09/08/2014, cycle 3 day 8 [facility] CA [id number] Met with patient and his wife during MD visit with Dr. Alvy Bimler.  Patient was instructed by Dr. Alvy Bimler to resume Lanlidomide today and complete out remaining prescription from last cycle (9 pills left).  Per Dr. Alvy Bimler, patient instructed to take 20 mg Decadron today and on days 15 and 22 cycle 3.  Patient and his wife verbalized comprehension and were without questions.  09/15/14 _0 :10 am, [facility] CA [id number], Cycle 3, Day 15 Met with patient and his wife along with G. Lott RN.  Patient took Decadron 20 mg at 6:30 this AM.  Due to patient's complaints of increased weakness, muscle pain, and other side effects (see 09/15/14 progress note by G. Lott, RN), the Elotuzumab  treatment was held today per order by on-call MD.  Patient and wife instructed not to take remaining Lanlidomide pills.  Patient and wife verbalized understanding. Will discuss further treatment plan with Dr. Alvy Bimler on 09/18/14.    09/18/2014 1015 [facility] CA [id number]  discovered patient was admitted to hospital on 09/15/14.  Reported SAE today per protocol requirements.  Will follow-up with Dr. Alvy Bimler.  09/18/2014 1332  [facility]MS CA [id number]  Informed Dr. Alvy Bimler that SAE was reported to [facility] due to hospitalization.  Per Dr. Alvy Bimler, patient will be removed from study.  Per Dr. Alvy Bimler, generalized weakness resulting in hospitalization is possibly (not definitively)  related to all 3 study drugs.

## 2014-09-08 NOTE — Telephone Encounter (Signed)
gv adn pritned appt sched and avs for pt for Sept and OCT...sed added tx. °

## 2014-09-08 NOTE — Assessment & Plan Note (Signed)
This is likely anemia of chronic disease. The patient denies recent history of bleeding such as epistaxis, hematuria or hematochezia. He is asymptomatic from the anemia. We will observe for now.  He does not require transfusion now.   

## 2014-09-08 NOTE — Assessment & Plan Note (Signed)
Recent repeat light chains and M spike are improving. The patient tolerated treatment poorly due to coexisting comorbidities. Due to persistent severe thrombocytopenia, I recommend holding Revlimid but to proceed with chemotherapy. I will be restart today. I will see how his blood work performed next week. He may need Revlimid dose adjustment. Due to poorly controlled diabetes, I plan to reduce dexamethasone to 20 mg weekly by mouth.

## 2014-09-08 NOTE — Assessment & Plan Note (Signed)
As mentioned above, I will restart Revlimid again. So far, he has no bleeding complications.

## 2014-09-08 NOTE — Assessment & Plan Note (Signed)
His blood sugar was high due to high-dose dexamethasone. I would reduce dexamethasone for now. His most recent blood sugar appeared to be under control.

## 2014-09-13 ENCOUNTER — Telehealth: Payer: Self-pay | Admitting: *Deleted

## 2014-09-13 ENCOUNTER — Other Ambulatory Visit: Payer: Self-pay | Admitting: Hematology and Oncology

## 2014-09-13 ENCOUNTER — Ambulatory Visit (HOSPITAL_BASED_OUTPATIENT_CLINIC_OR_DEPARTMENT_OTHER): Payer: Medicare Other

## 2014-09-13 VITALS — BP 96/45 | HR 89 | Temp 98.1°F | Resp 18

## 2014-09-13 DIAGNOSIS — E860 Dehydration: Secondary | ICD-10-CM

## 2014-09-13 MED ORDER — SODIUM CHLORIDE 0.9 % IJ SOLN
10.0000 mL | INTRAMUSCULAR | Status: DC | PRN
  Administered 2014-09-13: 10 mL
  Filled 2014-09-13: qty 10

## 2014-09-13 MED ORDER — HEPARIN SOD (PORK) LOCK FLUSH 100 UNIT/ML IV SOLN
500.0000 [IU] | Freq: Once | INTRAVENOUS | Status: AC | PRN
  Administered 2014-09-13: 500 [IU]
  Filled 2014-09-13: qty 5

## 2014-09-13 MED ORDER — SODIUM CHLORIDE 0.9 % IV SOLN
Freq: Once | INTRAVENOUS | Status: AC
  Administered 2014-09-13: 15:00:00 via INTRAVENOUS

## 2014-09-13 NOTE — Telephone Encounter (Signed)
Instructed wife to bring pt to [place] today at 2:30 pm for IVFs.  Instructed her to call 911 to take pt to ED if she is unable to bring him herself.  Wife states she is going to call pt's brother to help her and she will try to get him here today for IVFs.

## 2014-09-13 NOTE — Telephone Encounter (Signed)
Wife concerned pt is "awfully weak" today.  He can barely get in and out of bed by himself.  He fell this morning and she had to call for help to get him back up.  She asks if she can bring him in to see Dr. Alvy Bimler today or for IVFs?

## 2014-09-13 NOTE — Telephone Encounter (Signed)
The wife has to understand she still have to bring him in. If she cannot do this, He would have to be admitted and she has to call ambulance.  IVF daily through Saturday this week and next

## 2014-09-13 NOTE — Telephone Encounter (Signed)
Per staff message and POF I have scheduled appts. Advised scheduler of appts. JMW  

## 2014-09-14 ENCOUNTER — Ambulatory Visit (HOSPITAL_BASED_OUTPATIENT_CLINIC_OR_DEPARTMENT_OTHER): Payer: Medicare Other

## 2014-09-14 VITALS — BP 129/46 | HR 86 | Temp 97.6°F | Resp 18

## 2014-09-14 DIAGNOSIS — E860 Dehydration: Secondary | ICD-10-CM

## 2014-09-14 MED ORDER — ACETAMINOPHEN 325 MG PO TABS
ORAL_TABLET | ORAL | Status: AC
  Filled 2014-09-14: qty 2

## 2014-09-14 MED ORDER — SODIUM CHLORIDE 0.9 % IV SOLN
Freq: Once | INTRAVENOUS | Status: AC
  Administered 2014-09-14: 1000 mL via INTRAVENOUS

## 2014-09-14 MED ORDER — DEXAMETHASONE SODIUM PHOSPHATE 10 MG/ML IJ SOLN
INTRAMUSCULAR | Status: AC
  Filled 2014-09-14: qty 1

## 2014-09-14 MED ORDER — FAMOTIDINE IN NACL 20-0.9 MG/50ML-% IV SOLN
INTRAVENOUS | Status: AC
  Filled 2014-09-14: qty 50

## 2014-09-14 MED ORDER — DIPHENHYDRAMINE HCL 25 MG PO CAPS
ORAL_CAPSULE | ORAL | Status: AC
  Filled 2014-09-14: qty 2

## 2014-09-14 NOTE — Patient Instructions (Signed)

## 2014-09-15 ENCOUNTER — Encounter (HOSPITAL_COMMUNITY): Payer: Self-pay | Admitting: Emergency Medicine

## 2014-09-15 ENCOUNTER — Ambulatory Visit (HOSPITAL_BASED_OUTPATIENT_CLINIC_OR_DEPARTMENT_OTHER): Payer: Medicare Other

## 2014-09-15 ENCOUNTER — Encounter: Payer: Self-pay | Admitting: *Deleted

## 2014-09-15 ENCOUNTER — Ambulatory Visit: Payer: Medicare Other

## 2014-09-15 ENCOUNTER — Inpatient Hospital Stay (HOSPITAL_COMMUNITY)
Admission: EM | Admit: 2014-09-15 | Discharge: 2014-09-21 | DRG: 809 | Disposition: A | Discharge status: Hospice | Payer: Medicare Other | Attending: Internal Medicine | Admitting: Internal Medicine

## 2014-09-15 ENCOUNTER — Other Ambulatory Visit (HOSPITAL_BASED_OUTPATIENT_CLINIC_OR_DEPARTMENT_OTHER): Payer: Medicare Other

## 2014-09-15 VITALS — BP 120/57 | HR 72 | Temp 97.9°F | Resp 18

## 2014-09-15 DIAGNOSIS — E876 Hypokalemia: Secondary | ICD-10-CM | POA: Diagnosis present

## 2014-09-15 DIAGNOSIS — G20A1 Parkinson's disease without dyskinesia, without mention of fluctuations: Secondary | ICD-10-CM | POA: Diagnosis present

## 2014-09-15 DIAGNOSIS — D630 Anemia in neoplastic disease: Secondary | ICD-10-CM

## 2014-09-15 DIAGNOSIS — Z7982 Long term (current) use of aspirin: Secondary | ICD-10-CM

## 2014-09-15 DIAGNOSIS — R410 Disorientation, unspecified: Secondary | ICD-10-CM

## 2014-09-15 DIAGNOSIS — I77810 Thoracic aortic ectasia: Secondary | ICD-10-CM

## 2014-09-15 DIAGNOSIS — I959 Hypotension, unspecified: Secondary | ICD-10-CM | POA: Diagnosis present

## 2014-09-15 DIAGNOSIS — E860 Dehydration: Secondary | ICD-10-CM

## 2014-09-15 DIAGNOSIS — Z9484 Stem cells transplant status: Secondary | ICD-10-CM

## 2014-09-15 DIAGNOSIS — Z66 Do not resuscitate: Secondary | ICD-10-CM | POA: Diagnosis present

## 2014-09-15 DIAGNOSIS — I129 Hypertensive chronic kidney disease with stage 1 through stage 4 chronic kidney disease, or unspecified chronic kidney disease: Secondary | ICD-10-CM | POA: Diagnosis present

## 2014-09-15 DIAGNOSIS — R300 Dysuria: Secondary | ICD-10-CM | POA: Diagnosis present

## 2014-09-15 DIAGNOSIS — N183 Chronic kidney disease, stage 3 (moderate): Secondary | ICD-10-CM | POA: Diagnosis present

## 2014-09-15 DIAGNOSIS — Z794 Long term (current) use of insulin: Secondary | ICD-10-CM

## 2014-09-15 DIAGNOSIS — Z86718 Personal history of other venous thrombosis and embolism: Secondary | ICD-10-CM

## 2014-09-15 DIAGNOSIS — M47896 Other spondylosis, lumbar region: Secondary | ICD-10-CM | POA: Diagnosis present

## 2014-09-15 DIAGNOSIS — R630 Anorexia: Secondary | ICD-10-CM | POA: Diagnosis present

## 2014-09-15 DIAGNOSIS — I517 Cardiomegaly: Secondary | ICD-10-CM | POA: Diagnosis present

## 2014-09-15 DIAGNOSIS — R5383 Other fatigue: Secondary | ICD-10-CM

## 2014-09-15 DIAGNOSIS — IMO0002 Reserved for concepts with insufficient information to code with codable children: Secondary | ICD-10-CM

## 2014-09-15 DIAGNOSIS — Y92009 Unspecified place in unspecified non-institutional (private) residence as the place of occurrence of the external cause: Secondary | ICD-10-CM

## 2014-09-15 DIAGNOSIS — D61810 Antineoplastic chemotherapy induced pancytopenia: Principal | ICD-10-CM | POA: Diagnosis present

## 2014-09-15 DIAGNOSIS — Z00129 Encounter for routine child health examination without abnormal findings: Secondary | ICD-10-CM

## 2014-09-15 DIAGNOSIS — M25519 Pain in unspecified shoulder: Secondary | ICD-10-CM | POA: Diagnosis present

## 2014-09-15 DIAGNOSIS — M545 Low back pain: Secondary | ICD-10-CM | POA: Diagnosis present

## 2014-09-15 DIAGNOSIS — J9811 Atelectasis: Secondary | ICD-10-CM | POA: Diagnosis present

## 2014-09-15 DIAGNOSIS — D61818 Other pancytopenia: Secondary | ICD-10-CM | POA: Diagnosis present

## 2014-09-15 DIAGNOSIS — T451X5A Adverse effect of antineoplastic and immunosuppressive drugs, initial encounter: Secondary | ICD-10-CM

## 2014-09-15 DIAGNOSIS — C9002 Multiple myeloma in relapse: Secondary | ICD-10-CM

## 2014-09-15 DIAGNOSIS — Z79891 Long term (current) use of opiate analgesic: Secondary | ICD-10-CM

## 2014-09-15 DIAGNOSIS — T50905A Adverse effect of unspecified drugs, medicaments and biological substances, initial encounter: Secondary | ICD-10-CM

## 2014-09-15 DIAGNOSIS — R1310 Dysphagia, unspecified: Secondary | ICD-10-CM | POA: Diagnosis present

## 2014-09-15 DIAGNOSIS — R531 Weakness: Secondary | ICD-10-CM

## 2014-09-15 DIAGNOSIS — R5381 Other malaise: Secondary | ICD-10-CM

## 2014-09-15 DIAGNOSIS — E11649 Type 2 diabetes mellitus with hypoglycemia without coma: Secondary | ICD-10-CM | POA: Diagnosis not present

## 2014-09-15 DIAGNOSIS — I82409 Acute embolism and thrombosis of unspecified deep veins of unspecified lower extremity: Secondary | ICD-10-CM

## 2014-09-15 DIAGNOSIS — E119 Type 2 diabetes mellitus without complications: Secondary | ICD-10-CM

## 2014-09-15 DIAGNOSIS — K219 Gastro-esophageal reflux disease without esophagitis: Secondary | ICD-10-CM | POA: Diagnosis present

## 2014-09-15 DIAGNOSIS — M1990 Unspecified osteoarthritis, unspecified site: Secondary | ICD-10-CM | POA: Diagnosis present

## 2014-09-15 DIAGNOSIS — Z9181 History of falling: Secondary | ICD-10-CM

## 2014-09-15 DIAGNOSIS — G20 Parkinson's disease: Secondary | ICD-10-CM | POA: Diagnosis present

## 2014-09-15 DIAGNOSIS — Z79899 Other long term (current) drug therapy: Secondary | ICD-10-CM

## 2014-09-15 DIAGNOSIS — I10 Essential (primary) hypertension: Secondary | ICD-10-CM

## 2014-09-15 DIAGNOSIS — D6959 Other secondary thrombocytopenia: Secondary | ICD-10-CM

## 2014-09-15 DIAGNOSIS — M79605 Pain in left leg: Secondary | ICD-10-CM | POA: Diagnosis present

## 2014-09-15 DIAGNOSIS — E1140 Type 2 diabetes mellitus with diabetic neuropathy, unspecified: Secondary | ICD-10-CM | POA: Diagnosis present

## 2014-09-15 DIAGNOSIS — D701 Agranulocytosis secondary to cancer chemotherapy: Secondary | ICD-10-CM

## 2014-09-15 DIAGNOSIS — Z95828 Presence of other vascular implants and grafts: Secondary | ICD-10-CM

## 2014-09-15 DIAGNOSIS — M79604 Pain in right leg: Secondary | ICD-10-CM | POA: Diagnosis present

## 2014-09-15 DIAGNOSIS — Z87891 Personal history of nicotine dependence: Secondary | ICD-10-CM

## 2014-09-15 DIAGNOSIS — R112 Nausea with vomiting, unspecified: Secondary | ICD-10-CM | POA: Diagnosis present

## 2014-09-15 DIAGNOSIS — R197 Diarrhea, unspecified: Secondary | ICD-10-CM

## 2014-09-15 DIAGNOSIS — N179 Acute kidney failure, unspecified: Secondary | ICD-10-CM | POA: Diagnosis not present

## 2014-09-15 DIAGNOSIS — N401 Enlarged prostate with lower urinary tract symptoms: Secondary | ICD-10-CM | POA: Diagnosis present

## 2014-09-15 DIAGNOSIS — W19XXXA Unspecified fall, initial encounter: Secondary | ICD-10-CM | POA: Diagnosis present

## 2014-09-15 DIAGNOSIS — Z515 Encounter for palliative care: Secondary | ICD-10-CM

## 2014-09-15 DIAGNOSIS — E782 Mixed hyperlipidemia: Secondary | ICD-10-CM | POA: Diagnosis present

## 2014-09-15 DIAGNOSIS — G620 Drug-induced polyneuropathy: Secondary | ICD-10-CM | POA: Diagnosis present

## 2014-09-15 DIAGNOSIS — R600 Localized edema: Secondary | ICD-10-CM | POA: Diagnosis present

## 2014-09-15 DIAGNOSIS — Z7952 Long term (current) use of systemic steroids: Secondary | ICD-10-CM

## 2014-09-15 LAB — CBC WITH DIFFERENTIAL/PLATELET
BASO%: 0.3 % (ref 0.0–2.0)
BASOS ABS: 0 10*3/uL (ref 0.0–0.1)
EOS ABS: 0.1 10*3/uL (ref 0.0–0.5)
EOS%: 2.8 % (ref 0.0–7.0)
HEMATOCRIT: 27.3 % — AB (ref 38.4–49.9)
HEMOGLOBIN: 9.1 g/dL — AB (ref 13.0–17.1)
LYMPH#: 1 10*3/uL (ref 0.9–3.3)
LYMPH%: 35 % (ref 14.0–49.0)
MCH: 32.5 pg (ref 27.2–33.4)
MCHC: 33.3 g/dL (ref 32.0–36.0)
MCV: 97.5 fL (ref 79.3–98.0)
MONO#: 0.4 10*3/uL (ref 0.1–0.9)
MONO%: 14.3 % — ABNORMAL HIGH (ref 0.0–14.0)
NEUT%: 47.6 % (ref 39.0–75.0)
NEUTROS ABS: 1.4 10*3/uL — AB (ref 1.5–6.5)
Platelets: 33 10*3/uL — ABNORMAL LOW (ref 140–400)
RBC: 2.8 10*6/uL — ABNORMAL LOW (ref 4.20–5.82)
RDW: 15.3 % — ABNORMAL HIGH (ref 11.0–14.6)
WBC: 2.9 10*3/uL — ABNORMAL LOW (ref 4.0–10.3)

## 2014-09-15 LAB — COMPREHENSIVE METABOLIC PANEL (CC13)
ALBUMIN: 2.8 g/dL — AB (ref 3.5–5.0)
ALT: <6 U/L (ref 0–55)
ANION GAP: 9 meq/L (ref 3–11)
AST: 13 U/L (ref 5–34)
Alkaline Phosphatase: 88 U/L (ref 40–150)
BUN: 17 mg/dL (ref 7.0–26.0)
CHLORIDE: 109 meq/L (ref 98–109)
CO2: 23 meq/L (ref 22–29)
Calcium: 8.3 mg/dL — ABNORMAL LOW (ref 8.4–10.4)
Creatinine: 1.5 mg/dL — ABNORMAL HIGH (ref 0.7–1.3)
GLUCOSE: 208 mg/dL — AB (ref 70–140)
POTASSIUM: 3.6 meq/L (ref 3.5–5.1)
Sodium: 141 mEq/L (ref 136–145)
Total Bilirubin: 1.12 mg/dL (ref 0.20–1.20)
Total Protein: 4.9 g/dL — ABNORMAL LOW (ref 6.4–8.3)

## 2014-09-15 LAB — I-STAT CHEM 8, ED
BUN: 17 mg/dL (ref 6–23)
CHLORIDE: 105 meq/L (ref 96–112)
Calcium, Ion: 1.17 mmol/L (ref 1.13–1.30)
Creatinine, Ser: 1.3 mg/dL (ref 0.50–1.35)
Glucose, Bld: 281 mg/dL — ABNORMAL HIGH (ref 70–99)
HCT: 27 % — ABNORMAL LOW (ref 39.0–52.0)
Hemoglobin: 9.2 g/dL — ABNORMAL LOW (ref 13.0–17.0)
POTASSIUM: 3.4 meq/L — AB (ref 3.7–5.3)
Sodium: 141 mEq/L (ref 137–147)
TCO2: 23 mmol/L (ref 0–100)

## 2014-09-15 LAB — CBC
HEMATOCRIT: 24.9 % — AB (ref 39.0–52.0)
HEMOGLOBIN: 8.8 g/dL — AB (ref 13.0–17.0)
MCH: 34.1 pg — ABNORMAL HIGH (ref 26.0–34.0)
MCHC: 35.3 g/dL (ref 30.0–36.0)
MCV: 96.5 fL (ref 78.0–100.0)
Platelets: 27 10*3/uL — CL (ref 150–400)
RBC: 2.58 MIL/uL — AB (ref 4.22–5.81)
RDW: 15 % (ref 11.5–15.5)
WBC: 2.2 10*3/uL — AB (ref 4.0–10.5)

## 2014-09-15 LAB — POC OCCULT BLOOD, ED: Fecal Occult Bld: NEGATIVE

## 2014-09-15 MED ORDER — SODIUM CHLORIDE 0.9 % IV SOLN
1000.0000 mL | Freq: Once | INTRAVENOUS | Status: AC
  Administered 2014-09-15: 10:00:00 via INTRAVENOUS

## 2014-09-15 MED ORDER — SODIUM CHLORIDE 0.9 % IJ SOLN
10.0000 mL | INTRAMUSCULAR | Status: DC | PRN
  Administered 2014-09-15: 10 mL
  Filled 2014-09-15: qty 10

## 2014-09-15 MED ORDER — SODIUM CHLORIDE 0.9 % IJ SOLN
10.0000 mL | INTRAMUSCULAR | Status: DC | PRN
  Administered 2014-09-15: 10 mL via INTRAVENOUS
  Filled 2014-09-15: qty 10

## 2014-09-15 MED ORDER — POTASSIUM CHLORIDE CRYS ER 20 MEQ PO TBCR
40.0000 meq | EXTENDED_RELEASE_TABLET | Freq: Once | ORAL | Status: AC
  Administered 2014-09-15: 40 meq via ORAL
  Filled 2014-09-15: qty 2

## 2014-09-15 MED ORDER — SODIUM CHLORIDE 0.9 % IV BOLUS (SEPSIS)
1000.0000 mL | Freq: Once | INTRAVENOUS | Status: AC
  Administered 2014-09-15: 1000 mL via INTRAVENOUS

## 2014-09-15 MED ORDER — THIAMINE HCL 100 MG/ML IJ SOLN
100.0000 mg | Freq: Once | INTRAMUSCULAR | Status: AC
  Administered 2014-09-15: 100 mg via INTRAVENOUS
  Filled 2014-09-15: qty 2

## 2014-09-15 MED ORDER — HEPARIN SOD (PORK) LOCK FLUSH 100 UNIT/ML IV SOLN
500.0000 [IU] | Freq: Once | INTRAVENOUS | Status: AC | PRN
  Administered 2014-09-15: 500 [IU]
  Filled 2014-09-15: qty 5

## 2014-09-15 NOTE — Progress Notes (Unsigned)
09/15/14 @10 :00 am, [facility] CA [id number], Cycle 3, Day 15:  [name], accompanied by his wife, into the [facility] for labs, IV fluids and treatment with elotuzumab.  Spoke with the patient and his wife before his lab appointment.  His wife says that he has become progressively weaker over the last week or so.  [name] said that he can hardly lift his arms and his muscles hurt, especially his leg and lower back muscles.  He fell this morning and could not get up.  They called EMS to assist him from the floor.  Generalized weakness and muscle pain are the most common adverse events associated with elotuzumab.  He has two remaining dose of Revlimid for this cycle.  His platelets are 33K today.  Contacted Dr. Marin Olp, the on-call doctor, to report all of the above.  He advised to hold the elotuzumab and to instruct the patient not to take the last dose two doses of Revlimid.  [name] will receive IV hydration as planned today, tomorrow and Monday.  Will discuss events of today with Dr. Alvy Bimler upon her return on Monday September 18, 2014.  Informed the treating nurse, Mayra Reel, RN and the Theone Murdoch, PharmD. Of the need to hold treatment today.

## 2014-09-15 NOTE — ED Notes (Signed)
Bed: WA10 Expected date:  Expected time:  Means of arrival:  Comments: EMS 

## 2014-09-15 NOTE — ED Notes (Signed)
Per EMS: From home, oncology pt - "multiple myeloma", pt had a chemo treatment today at 8am, has had increased weakness x 2 weeks, per pt "WBC was low, BP low this morning", pt took his dexamethasone today. EMS Vitals: BP - 155/65 HR - 76 SpO2 - 99% Denies N/V/D CBG - 394 (takes Lantus) Pt is due to have another treatment tomorrow at 10am.  Wife, Sister-in-law and brother-in-law are present with pt.

## 2014-09-15 NOTE — ED Notes (Signed)
Critical Platelet Count: 27 Called back and verified with Abigail Butts (lab).  Primary nurse and MD made aware.

## 2014-09-15 NOTE — ED Notes (Signed)
Requested urine from patient. Patient can not urinate at this time but is aware that we need it.

## 2014-09-15 NOTE — ED Provider Notes (Addendum)
CSN: [id number]     Arrival date & time 09/15/14  1908 History   First MD Initiated Contact with Patient 09/15/14 2045     Chief Complaint  Patient presents with  . Extremity Weakness     (Consider location/radiation/quality/duration/timing/severity/associated sxs/prior Treatment) Patient is a 73 y.o. male presenting with extremity weakness.  Extremity Weakness   Complains of generalized weakness progressively worsening over the past 2 weeks. Today he slid down onto the floor after having received intravenous fluids at the cancer center and was unable to get up. He denies fever he admits to feeling thirsty and lightheaded. Symptoms worse with standing improved with lying spine. Admits to diminished appetite and diminished oral intake. No other associated symptoms no fever. Patient was seen in the cancer Center this morning and received intravenous fluids. Past Medical History  Diagnosis Date  . Diabetes mellitus   . GERD (gastroesophageal reflux disease)   . Anemia   . Hypertension   . Prostatic hyperplasia   . Arthritis   . Multiple myeloma in relapse 12/24/2011  . Benign essential HTN 12/24/2011  . CRI (chronic renal insufficiency) 12/26/2011  . 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 DVT of lower extremity (deep venous thrombosis) 05/14/2011  . Hyperlipidemia, mixed 12/26/2011  . Herpes zoster infection 03/11/2010  . DM II (diabetes mellitus, type II), controlled 12/26/2011  . Pharyngitis, acute 03/21/2013  . 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 Diabetic neuropathy, type II diabetes mellitus 05/31/2013  . Urethritis 10/04/2013  . Dehydration 07/11/2014  . Confusion 08/22/2014  . Nausea with vomiting 08/22/2014  . Fever 08/22/2014  . Dysuria 08/22/2014   Past Surgical History  Procedure Laterality Date  . Foot surgery Left    Family History  Problem Relation Age of Onset  . CAD Neg Hx    History  Substance Use Topics  . Smoking status:  Former Smoker    Quit date: 03/08/2008  . Smokeless tobacco: Never Used  . Alcohol Use: No    Review of Systems  HENT: Negative.   Respiratory: Negative.   Cardiovascular: Negative.   Gastrointestinal: Negative.   Genitourinary: Positive for dysuria.  Musculoskeletal: Positive for extremity weakness.  Skin: Negative.   Neurological: Positive for weakness.  Psychiatric/Behavioral: Negative.   All other systems reviewed and are negative.     Allergies  Review of patient's allergies indicates no known allergies.  Home Medications   Prior to Admission medications   Medication Sig Start Date End Date Taking? Authorizing Provider  acetaminophen (TYLENOL) 500 MG tablet Take 500 mg by mouth every 6 (six) hours as needed. For pain.   Yes Historical Provider, MD  aspirin EC 81 MG tablet Take 81 mg by mouth daily after breakfast.    Yes Historical Provider, MD  atorvastatin (LIPITOR) 10 MG tablet Take 10 mg by mouth at bedtime.     Yes Historical Provider, MD  carbidopa-levodopa (SINEMET IR) 25-100 MG per tablet Take 1 tablet by mouth 3 (three) times daily.   Yes Historical Provider, MD  dexamethasone (DECADRON) 4 MG tablet Take 7 tablets (28 mg total) by mouth once a week. 07/04/14  Yes Heath Lark, MD  Insulin Glargine (LANTUS SOLOSTAR [place]) Inject 20-30 Units into the skin every evening.    Yes Historical Provider, MD  lidocaine-prilocaine (EMLA) cream Apply 1 application topically as needed. Apply to port at least 1 hour before procedure & cover. 02/23/14  Yes Annia Belt, MD  morphine (MS  CONTIN) 30 MG 12 hr tablet Take 1 tablet (30 mg total) by mouth every 12 (twelve) hours. 05/23/14  Yes Heath Lark, MD  morphine (MSIR) 15 MG tablet Take 1 tablet (15 mg total) by mouth every 4 (four) hours as needed for severe pain. 05/23/14  Yes Heath Lark, MD  ondansetron (ZOFRAN-ODT) 8 MG disintegrating tablet Take 8 mg by mouth every 8 (eight) hours as needed for nausea or vomiting.   Yes Historical  Provider, MD  polyethylene glycol (MIRALAX / GLYCOLAX) packet Take 17 g by mouth daily after breakfast.    Yes Historical Provider, MD  pregabalin (LYRICA) 100 MG capsule Take 1 capsule (100 mg total) by mouth 2 (two) times daily. 07/21/14  Yes Heath Lark, MD  valACYclovir (VALTREX) 500 MG tablet Take 1 tablet (500 mg total) by mouth daily. Takes once daily. 05/23/14  Yes Heath Lark, MD  lenalidomide (REVLIMID) 25 MG capsule Take 1 capsule (25 mg total) by mouth daily. 08/24/14   Ni Gorsuch, MD   BP 134/54  Pulse 77  Temp(Src) 98.3 F (36.8 C) (Oral)  Resp 18  SpO2 99% Physical Exam  Nursing note and vitals reviewed. Constitutional: He is oriented to person, place, and time. He appears well-developed and well-nourished.  Chronically ill appearing  HENT:  Head: Normocephalic and atraumatic.  Mucous membranes dry  Eyes: Conjunctivae are normal. Pupils are equal, round, and reactive to light.  Conjunctiva pale  Neck: Neck supple. No tracheal deviation present. No thyromegaly present.  Cardiovascular: Normal rate and regular rhythm.   No murmur heard. Pulmonary/Chest: Effort normal and breath sounds normal.  Abdominal: Soft. Bowel sounds are normal. He exhibits no distension. There is no tenderness.  Genitourinary: Penis normal. Guaiac positive stool.  Musculoskeletal: Normal range of motion. He exhibits edema. He exhibits no tenderness.  1+ pretibial pitting edema bilaterally  Neurological: He is alert and oriented to person, place, and time. Coordination normal.  Skin: Skin is warm and dry. No rash noted.  Psychiatric: He has a normal mood and affect.    ED Course  Procedures (including critical care time) Labs Review Labs Reviewed - No data to display  Imaging Review No results found.   EKG Interpretation None     patient continues to feel weak after treatment with intravenous fluids. He is unable to stand up from the bed. Results for orders placed during the hospital  encounter of 09/15/14  CBC      Result Value Ref Range   WBC 2.2 (*) 4.0 - 10.5 K/uL   RBC 2.58 (*) 4.22 - 5.81 MIL/uL   Hemoglobin 8.8 (*) 13.0 - 17.0 g/dL   HCT 24.9 (*) 39.0 - 52.0 %   MCV 96.5  78.0 - 100.0 fL   MCH 34.1 (*) 26.0 - 34.0 pg   MCHC 35.3  30.0 - 36.0 g/dL   RDW 15.0  11.5 - 15.5 %   Platelets 27 (*) 150 - 400 K/uL  I-STAT CHEM 8, ED      Result Value Ref Range   Sodium 141  137 - 147 mEq/L   Potassium 3.4 (*) 3.7 - 5.3 mEq/L   Chloride 105  96 - 112 mEq/L   BUN 17  6 - 23 mg/dL   Creatinine, Ser 1.30  0.50 - 1.35 mg/dL   Glucose, Bld 281 (*) 70 - 99 mg/dL   Calcium, Ion 1.17  1.13 - 1.30 mmol/L   TCO2 23  0 - 100 mmol/L   Hemoglobin 9.2 (*)  13.0 - 17.0 g/dL   HCT 27.0 (*) 39.0 - 52.0 %  POC OCCULT BLOOD, ED      Result Value Ref Range   Fecal Occult Bld NEGATIVE  NEGATIVE   Date: 09/16/2014  Rate: 55  Rhythm: sinus bradycardia  QRS Axis: left  Intervals: normal  ST/T Wave abnormalities: nonspecific T wave changes  Conduction Disutrbances:nonspecific intraventricular conduction delay  Narrative Interpretation:   Old EKG Reviewed: No significant change since last tracing  No results found.  MDM  Patient's wife reports that she is unable to manage him at home and he is at risk for falls Final diagnoses:  None   Spoke with Dr.Patel who will arrange for inpatient stay Diagnosis #1 generalized weakness #2anemia #3thropmbocytopenia #4hyperglycemia #5 leukoprenia    Orlie Dakin, MD 09/16/14 6387  Orlie Dakin, MD 09/16/14 5643

## 2014-09-15 NOTE — ED Notes (Signed)
Pt unable to void at this time. 

## 2014-09-15 NOTE — ED Notes (Signed)
Patient is alert and oriented x3.  He is complainingn of weakness that started a week ago  And has progressively gotten worse.  His wife states that she has been helping him all week  But today was unable to lift him due to his weakness.  Patient has a history of chronic back pain

## 2014-09-15 NOTE — Patient Instructions (Signed)

## 2014-09-15 NOTE — Patient Instructions (Signed)

## 2014-09-16 ENCOUNTER — Inpatient Hospital Stay (HOSPITAL_COMMUNITY): Payer: Medicare Other

## 2014-09-16 ENCOUNTER — Emergency Department (HOSPITAL_COMMUNITY): Payer: Medicare Other

## 2014-09-16 ENCOUNTER — Ambulatory Visit: Payer: Medicare Other

## 2014-09-16 DIAGNOSIS — R112 Nausea with vomiting, unspecified: Secondary | ICD-10-CM | POA: Diagnosis present

## 2014-09-16 DIAGNOSIS — I517 Cardiomegaly: Secondary | ICD-10-CM | POA: Diagnosis present

## 2014-09-16 DIAGNOSIS — Z87891 Personal history of nicotine dependence: Secondary | ICD-10-CM | POA: Diagnosis not present

## 2014-09-16 DIAGNOSIS — R600 Localized edema: Secondary | ICD-10-CM | POA: Diagnosis present

## 2014-09-16 DIAGNOSIS — M1990 Unspecified osteoarthritis, unspecified site: Secondary | ICD-10-CM | POA: Diagnosis present

## 2014-09-16 DIAGNOSIS — R300 Dysuria: Secondary | ICD-10-CM | POA: Diagnosis present

## 2014-09-16 DIAGNOSIS — N183 Chronic kidney disease, stage 3 (moderate): Secondary | ICD-10-CM | POA: Diagnosis present

## 2014-09-16 DIAGNOSIS — M79609 Pain in unspecified limb: Secondary | ICD-10-CM

## 2014-09-16 DIAGNOSIS — M79605 Pain in left leg: Secondary | ICD-10-CM | POA: Diagnosis present

## 2014-09-16 DIAGNOSIS — E11649 Type 2 diabetes mellitus with hypoglycemia without coma: Secondary | ICD-10-CM | POA: Diagnosis not present

## 2014-09-16 DIAGNOSIS — E1140 Type 2 diabetes mellitus with diabetic neuropathy, unspecified: Secondary | ICD-10-CM | POA: Diagnosis present

## 2014-09-16 DIAGNOSIS — Y92009 Unspecified place in unspecified non-institutional (private) residence as the place of occurrence of the external cause: Secondary | ICD-10-CM | POA: Diagnosis not present

## 2014-09-16 DIAGNOSIS — I959 Hypotension, unspecified: Secondary | ICD-10-CM | POA: Diagnosis present

## 2014-09-16 DIAGNOSIS — R531 Weakness: Secondary | ICD-10-CM

## 2014-09-16 DIAGNOSIS — M47896 Other spondylosis, lumbar region: Secondary | ICD-10-CM | POA: Diagnosis present

## 2014-09-16 DIAGNOSIS — Z9181 History of falling: Secondary | ICD-10-CM | POA: Diagnosis not present

## 2014-09-16 DIAGNOSIS — E782 Mixed hyperlipidemia: Secondary | ICD-10-CM | POA: Diagnosis present

## 2014-09-16 DIAGNOSIS — Z79899 Other long term (current) drug therapy: Secondary | ICD-10-CM | POA: Diagnosis not present

## 2014-09-16 DIAGNOSIS — M25519 Pain in unspecified shoulder: Secondary | ICD-10-CM | POA: Diagnosis present

## 2014-09-16 DIAGNOSIS — R1310 Dysphagia, unspecified: Secondary | ICD-10-CM | POA: Diagnosis present

## 2014-09-16 DIAGNOSIS — G620 Drug-induced polyneuropathy: Secondary | ICD-10-CM | POA: Diagnosis present

## 2014-09-16 DIAGNOSIS — G20 Parkinson's disease: Secondary | ICD-10-CM

## 2014-09-16 DIAGNOSIS — C9002 Multiple myeloma in relapse: Secondary | ICD-10-CM

## 2014-09-16 DIAGNOSIS — Z66 Do not resuscitate: Secondary | ICD-10-CM | POA: Diagnosis present

## 2014-09-16 DIAGNOSIS — M7989 Other specified soft tissue disorders: Secondary | ICD-10-CM

## 2014-09-16 DIAGNOSIS — E1142 Type 2 diabetes mellitus with diabetic polyneuropathy: Secondary | ICD-10-CM

## 2014-09-16 DIAGNOSIS — Z79891 Long term (current) use of opiate analgesic: Secondary | ICD-10-CM | POA: Diagnosis not present

## 2014-09-16 DIAGNOSIS — D61818 Other pancytopenia: Secondary | ICD-10-CM

## 2014-09-16 DIAGNOSIS — J9811 Atelectasis: Secondary | ICD-10-CM | POA: Diagnosis present

## 2014-09-16 DIAGNOSIS — R5381 Other malaise: Secondary | ICD-10-CM

## 2014-09-16 DIAGNOSIS — M545 Low back pain: Secondary | ICD-10-CM | POA: Diagnosis present

## 2014-09-16 DIAGNOSIS — W19XXXA Unspecified fall, initial encounter: Secondary | ICD-10-CM | POA: Diagnosis present

## 2014-09-16 DIAGNOSIS — I129 Hypertensive chronic kidney disease with stage 1 through stage 4 chronic kidney disease, or unspecified chronic kidney disease: Secondary | ICD-10-CM | POA: Diagnosis present

## 2014-09-16 DIAGNOSIS — N401 Enlarged prostate with lower urinary tract symptoms: Secondary | ICD-10-CM | POA: Diagnosis present

## 2014-09-16 DIAGNOSIS — Z86718 Personal history of other venous thrombosis and embolism: Secondary | ICD-10-CM | POA: Diagnosis not present

## 2014-09-16 DIAGNOSIS — R630 Anorexia: Secondary | ICD-10-CM | POA: Diagnosis present

## 2014-09-16 DIAGNOSIS — Z515 Encounter for palliative care: Secondary | ICD-10-CM | POA: Diagnosis not present

## 2014-09-16 DIAGNOSIS — D61810 Antineoplastic chemotherapy induced pancytopenia: Secondary | ICD-10-CM | POA: Diagnosis present

## 2014-09-16 DIAGNOSIS — I82409 Acute embolism and thrombosis of unspecified deep veins of unspecified lower extremity: Secondary | ICD-10-CM

## 2014-09-16 DIAGNOSIS — N179 Acute kidney failure, unspecified: Secondary | ICD-10-CM | POA: Diagnosis not present

## 2014-09-16 DIAGNOSIS — M79604 Pain in right leg: Secondary | ICD-10-CM | POA: Diagnosis present

## 2014-09-16 DIAGNOSIS — Z7952 Long term (current) use of systemic steroids: Secondary | ICD-10-CM | POA: Diagnosis not present

## 2014-09-16 DIAGNOSIS — E876 Hypokalemia: Secondary | ICD-10-CM | POA: Diagnosis present

## 2014-09-16 DIAGNOSIS — E1149 Type 2 diabetes mellitus with other diabetic neurological complication: Secondary | ICD-10-CM

## 2014-09-16 DIAGNOSIS — Z7982 Long term (current) use of aspirin: Secondary | ICD-10-CM | POA: Diagnosis not present

## 2014-09-16 DIAGNOSIS — R5383 Other fatigue: Secondary | ICD-10-CM

## 2014-09-16 DIAGNOSIS — Z794 Long term (current) use of insulin: Secondary | ICD-10-CM | POA: Diagnosis not present

## 2014-09-16 DIAGNOSIS — R609 Edema, unspecified: Secondary | ICD-10-CM

## 2014-09-16 DIAGNOSIS — K219 Gastro-esophageal reflux disease without esophagitis: Secondary | ICD-10-CM | POA: Diagnosis present

## 2014-09-16 DIAGNOSIS — Z9484 Stem cells transplant status: Secondary | ICD-10-CM | POA: Diagnosis not present

## 2014-09-16 DIAGNOSIS — I10 Essential (primary) hypertension: Secondary | ICD-10-CM

## 2014-09-16 LAB — COMPREHENSIVE METABOLIC PANEL
ALK PHOS: 79 U/L (ref 39–117)
ALT: 14 U/L (ref 0–53)
ALT: 5 U/L (ref 0–53)
ANION GAP: 11 (ref 5–15)
AST: 10 U/L (ref 0–37)
AST: 10 U/L (ref 0–37)
Albumin: 2.7 g/dL — ABNORMAL LOW (ref 3.5–5.2)
Albumin: 2.8 g/dL — ABNORMAL LOW (ref 3.5–5.2)
Alkaline Phosphatase: 80 U/L (ref 39–117)
Anion gap: 11 (ref 5–15)
BILIRUBIN TOTAL: 0.8 mg/dL (ref 0.3–1.2)
BUN: 17 mg/dL (ref 6–23)
BUN: 15 mg/dL (ref 6–23)
CALCIUM: 7.9 mg/dL — AB (ref 8.4–10.5)
CHLORIDE: 106 meq/L (ref 96–112)
CHLORIDE: 107 meq/L (ref 96–112)
CO2: 22 meq/L (ref 19–32)
CO2: 23 meq/L (ref 19–32)
Calcium: 8.1 mg/dL — ABNORMAL LOW (ref 8.4–10.5)
Creatinine, Ser: 1.26 mg/dL (ref 0.50–1.35)
Creatinine, Ser: 1.3 mg/dL (ref 0.50–1.35)
GFR calc Af Amer: 63 mL/min — ABNORMAL LOW (ref >90)
GFR calc Af Amer: 61 mL/min — ABNORMAL LOW (ref >90)
GFR, EST NON AFRICAN AMERICAN: 52 mL/min — AB (ref >90)
GFR, EST NON AFRICAN AMERICAN: 54 mL/min — AB (ref >90)
GLUCOSE: 163 mg/dL — AB (ref 70–99)
Glucose, Bld: 252 mg/dL — ABNORMAL HIGH (ref 70–99)
Potassium: 3.6 mEq/L — ABNORMAL LOW (ref 3.7–5.3)
Potassium: 3.7 mEq/L (ref 3.7–5.3)
SODIUM: 139 meq/L (ref 137–147)
SODIUM: 141 meq/L (ref 137–147)
TOTAL PROTEIN: 4.9 g/dL — AB (ref 6.0–8.3)
Total Bilirubin: 1 mg/dL (ref 0.3–1.2)
Total Protein: 4.7 g/dL — ABNORMAL LOW (ref 6.0–8.3)

## 2014-09-16 LAB — URINALYSIS, ROUTINE W REFLEX MICROSCOPIC
Bilirubin Urine: NEGATIVE
GLUCOSE, UA: 250 mg/dL — AB
Ketones, ur: NEGATIVE mg/dL
LEUKOCYTES UA: NEGATIVE
Nitrite: NEGATIVE
PH: 6 (ref 5.0–8.0)
PROTEIN: 30 mg/dL — AB
SPECIFIC GRAVITY, URINE: 1.015 (ref 1.005–1.030)
Urobilinogen, UA: 1 mg/dL (ref 0.0–1.0)

## 2014-09-16 LAB — CBC WITH DIFFERENTIAL/PLATELET
BASOS PCT: 0 % (ref 0–1)
Basophils Absolute: 0 10*3/uL (ref 0.0–0.1)
EOS ABS: 0 10*3/uL (ref 0.0–0.7)
Eosinophils Relative: 1 % (ref 0–5)
HEMATOCRIT: 26.1 % — AB (ref 39.0–52.0)
HEMOGLOBIN: 9 g/dL — AB (ref 13.0–17.0)
Lymphocytes Relative: 23 % (ref 12–46)
Lymphs Abs: 0.6 10*3/uL — ABNORMAL LOW (ref 0.7–4.0)
MCH: 33.2 pg (ref 26.0–34.0)
MCHC: 34.5 g/dL (ref 30.0–36.0)
MCV: 96.3 fL (ref 78.0–100.0)
Monocytes Absolute: 0.4 10*3/uL (ref 0.1–1.0)
Monocytes Relative: 14 % — ABNORMAL HIGH (ref 3–12)
NEUTROS ABS: 1.5 10*3/uL — AB (ref 1.7–7.7)
NEUTROS PCT: 62 % (ref 43–77)
Platelets: 27 10*3/uL — CL (ref 150–400)
RBC: 2.71 MIL/uL — AB (ref 4.22–5.81)
RDW: 14.9 % (ref 11.5–15.5)
WBC: 2.5 10*3/uL — ABNORMAL LOW (ref 4.0–10.5)

## 2014-09-16 LAB — GLUCOSE, CAPILLARY
GLUCOSE-CAPILLARY: 232 mg/dL — AB (ref 70–99)
GLUCOSE-CAPILLARY: 170 mg/dL — AB (ref 70–99)
GLUCOSE-CAPILLARY: 152 mg/dL — AB (ref 70–99)
Glucose-Capillary: 166 mg/dL — ABNORMAL HIGH (ref 70–99)
Glucose-Capillary: 155 mg/dL — ABNORMAL HIGH (ref 70–99)

## 2014-09-16 LAB — DIFFERENTIAL
Basophils Absolute: 0 10*3/uL (ref 0.0–0.1)
Basophils Relative: 0 % (ref 0–1)
EOS ABS: 0 10*3/uL (ref 0.0–0.7)
EOS PCT: 0 % (ref 0–5)
LYMPHS PCT: 27 % (ref 12–46)
Lymphs Abs: 0.6 10*3/uL — ABNORMAL LOW (ref 0.7–4.0)
MONOS PCT: 11 % (ref 3–12)
Monocytes Absolute: 0.2 10*3/uL (ref 0.1–1.0)
NEUTROS ABS: 1.4 10*3/uL — AB (ref 1.7–7.7)
NEUTROS PCT: 62 % (ref 43–77)

## 2014-09-16 LAB — CK: Total CK: 23 U/L (ref 7–232)

## 2014-09-16 LAB — PROTIME-INR
INR: 1.43 (ref 0.00–1.49)
Prothrombin Time: 17.5 seconds — ABNORMAL HIGH (ref 11.6–15.2)

## 2014-09-16 LAB — URINE MICROSCOPIC-ADD ON: Urine-Other: NONE SEEN

## 2014-09-16 LAB — FIBRINOGEN: Fibrinogen: 284 mg/dL (ref 204–475)

## 2014-09-16 MED ORDER — INSULIN ASPART 100 UNIT/ML ~~LOC~~ SOLN
0.0000 [IU] | Freq: Three times a day (TID) | SUBCUTANEOUS | Status: DC
  Administered 2014-09-16 (×3): 3 [IU] via SUBCUTANEOUS

## 2014-09-16 MED ORDER — VALACYCLOVIR HCL 500 MG PO TABS
500.0000 mg | ORAL_TABLET | Freq: Every day | ORAL | Status: DC
  Administered 2014-09-16 – 2014-09-20 (×4): 500 mg via ORAL
  Filled 2014-09-16 (×5): qty 1

## 2014-09-16 MED ORDER — ACETAMINOPHEN 500 MG PO TABS: 500.0000 mg | ORAL_TABLET | Freq: Four times a day (QID) | ORAL | Status: DC | PRN

## 2014-09-16 MED ORDER — ACETAMINOPHEN 325 MG PO TABS: 650.0000 mg | ORAL_TABLET | Freq: Four times a day (QID) | ORAL | Status: DC | PRN

## 2014-09-16 MED ORDER — SODIUM CHLORIDE 0.9 % IJ SOLN
3.0000 mL | Freq: Two times a day (BID) | INTRAMUSCULAR | Status: DC
  Administered 2014-09-16 – 2014-09-19 (×4): 3 mL via INTRAVENOUS

## 2014-09-16 MED ORDER — POTASSIUM CHLORIDE CRYS ER 20 MEQ PO TBCR
40.0000 meq | EXTENDED_RELEASE_TABLET | Freq: Once | ORAL | Status: DC
  Filled 2014-09-16: qty 2

## 2014-09-16 MED ORDER — MORPHINE SULFATE 15 MG PO TABS: 15.0000 mg | ORAL_TABLET | ORAL | Status: DC | PRN

## 2014-09-16 MED ORDER — INSULIN ASPART 100 UNIT/ML ~~LOC~~ SOLN
0.0000 [IU] | Freq: Every day | SUBCUTANEOUS | Status: DC
  Administered 2014-09-16: 2 [IU] via SUBCUTANEOUS
  Filled 2014-09-16: qty 1

## 2014-09-16 MED ORDER — PREGABALIN 50 MG PO CAPS
100.0000 mg | ORAL_CAPSULE | Freq: Two times a day (BID) | ORAL | Status: DC
  Administered 2014-09-16 – 2014-09-20 (×9): 100 mg via ORAL
  Filled 2014-09-16 (×9): qty 2

## 2014-09-16 MED ORDER — INSULIN GLARGINE 100 UNIT/ML ~~LOC~~ SOLN
20.0000 [IU] | Freq: Every day | SUBCUTANEOUS | Status: DC
  Administered 2014-09-16 – 2014-09-17 (×3): 20 [IU] via SUBCUTANEOUS
  Filled 2014-09-16 (×3): qty 0.2

## 2014-09-16 MED ORDER — ACETAMINOPHEN 650 MG RE SUPP: 650.0000 mg | Freq: Four times a day (QID) | RECTAL | Status: DC | PRN

## 2014-09-16 MED ORDER — MORPHINE SULFATE ER 30 MG PO TBCR
30.0000 mg | EXTENDED_RELEASE_TABLET | Freq: Two times a day (BID) | ORAL | Status: DC
  Administered 2014-09-16 – 2014-09-20 (×9): 30 mg via ORAL
  Filled 2014-09-16 (×9): qty 1
  Filled 2014-09-16: qty 2

## 2014-09-16 MED ORDER — CARBIDOPA-LEVODOPA 25-100 MG PO TABS
1.0000 | ORAL_TABLET | Freq: Three times a day (TID) | ORAL | Status: DC
  Administered 2014-09-16 – 2014-09-20 (×12): 1 via ORAL
  Filled 2014-09-16 (×20): qty 1

## 2014-09-16 NOTE — Progress Notes (Signed)
*  Preliminary Results* Bilateral lower extremity venous duplex completed. Bilateral lower extremities are negative for deep vein thrombosis. There is no evidence of Baker's cyst bilaterally.  09/16/2014  Maudry Mayhew, RVT, RDCS, RDMS

## 2014-09-16 NOTE — Progress Notes (Signed)
PROGRESS NOTE  [name] [name] TSV:[id number] DOB: 11/28/1941 DOA: 09/15/2014 PCP: [name] Hams, MD  HPI: [name] [name] is a 73 y.o. male with history of diabetes mellitus, GERD, multiple myeloma with a relapse chemotherapy, chemotherapy induced pancytopenia, DVT in the past not on any anticoagulation , peripheral neuropathy, hypertension.  Patient is presenting with complaint of generalized weakness. He was diagnosed with MM in 2010 and has underwent multiple rounds of chemotherapy as well as autologous stem cell support at [facility] in 2011. Currently receiving treatment with Elotuzumab for the past 2 weeks and Revlimid.   Subjective/ 24 H Interval events - continues to feel very weak this morning  Assessment/Plan: Multiple myeloma - followed by [name] [name], s/p multiple rounds of chemotherapy as well as autologous stem cell support at [facility] in 2011, with initial responses to various cycles however later with worsening serum light chains. - he is now enrolled in a clinical trial with Revlimid and Elotuzumab  - discussed with Oncology this morning, appreciate consultation Generalized weakness - progressively weak since starting his Elotuzumab, muscle pain and unable to walk and barely able to lift his arms up - prior functional status, ambulating with a walker - PT consult - no evidence of active infection, CXR and UA unremarkable Pancytopenia  - in the setting of chemotherapy, no active bleeding - Plt 27 - WBC 2.2 / ANC 1.4 - Hb 8.2 - monitor - hold aspirin for now Parkinson's disease - patient with mild rigidity on exam, tells me that his disease is well controlled - Continue Sinemet Diabetes mellitus.  - Continue home Lantus and place the patient on sliding scale.  History of DVT - patient has history of DVT I think in 2012 - not currently on anticoagulation due to pancytopenia  - LE DVT Doppler pending  Diet: heart healthy/carb modified Fluids: none  DVT Prophylaxis: TED,  switch to SCDs if DVT US negative  Code Status: Full Family Communication: d/w patient  Disposition Plan: inpatient. PT to evaluate.   Consultants:  Oncology   Procedures:  None    Antibiotics Valtrex >> prior to admission  Studies  Filed Vitals:   09/16/14 0315 09/16/14 0331 09/16/14 0411 09/16/14 0439  BP: 133/60  133/60 150/70  Pulse:   62 58  Temp:  97.8 F (36.6 C)  97.5 F (36.4 C)  TempSrc:  Oral  Oral  Resp: 18 18 20 20   Height:    5' 11"  (1.803 m)  Weight:    102.5 kg (225 lb 15.5 oz)  SpO2:  100% 99% 100%    Intake/Output Summary (Last 24 hours) at 09/16/14 0841 Last data filed at 09/16/14 0754  Gross per 24 hour  Intake      0 ml  Output    300 ml  Net   -300 ml   Filed Weights   09/16/14 0439  Weight: 102.5 kg (225 lb 15.5 oz)   Exam:  General:  NAD, ill appearing male  Cardiovascular: RRR  Respiratory: CTA biL  Abdomen: soft, non tender  MSK: 1-2+ edema  Neuro: generalized weakness, strength 4/5 in all 4 extremities, resting tremor present, cogwheel rigidity  Data Reviewed: Basic Metabolic Panel:  Recent Labs Lab 09/15/14 0910 09/15/14 2211 09/16/14 0058  NA 141 141 139  K 3.6 3.4* 3.6*  CL  --  105 106  CO2 23  --  22  GLUCOSE 208* 281* 252*  BUN 17.0 17 17  CREATININE 1.5* 1.30 1.26  CALCIUM 8.3*  --  7.9*   Liver Function Tests:  Recent Labs Lab 09/15/14 0910 09/16/14 0058  AST 13 10  ALT <6 14  ALKPHOS 88 80  BILITOT 1.12 1.0  PROT 4.9* 4.7*  ALBUMIN 2.8* 2.7*   CBC:  Recent Labs Lab 09/15/14 0910 09/15/14 2158 09/15/14 2211  WBC 2.9* 2.2*  --   NEUTROABS 1.4* 1.4*  --   HGB 9.1* 8.8* 9.2*  HCT 27.3* 24.9* 27.0*  MCV 97.5 96.5  --   PLT 33* 27*  --    Cardiac Enzymes:  Recent Labs Lab 09/16/14 0058  CKTOTAL 23   CBG:  Recent Labs Lab 09/16/14 0317 09/16/14 0731  GLUCAP 232* 166*    Recent Results (from the past 240 hour(s))  TECHNOLOGIST REVIEW     Status: None   Collection Time      09/08/14 10:50 AM      Result Value Ref Range Status   Technologist Review Oc myelocyte   Final   Studies: No results found.  Scheduled Meds: . carbidopa-levodopa  1 tablet Oral TID  . insulin aspart  0-15 Units Subcutaneous TID WC  . insulin aspart  0-5 Units Subcutaneous QHS  . insulin glargine  20 Units Subcutaneous QHS  . morphine  30 mg Oral Q12H  . pregabalin  100 mg Oral BID  . sodium chloride  3 mL Intravenous Q12H  . valACYclovir  500 mg Oral Daily   Continuous Infusions:   Principal Problem:   Pancytopenia Active Problems:   Multiple myeloma in relapse   GERD (gastroesophageal reflux disease)   DVT of lower extremity (deep venous thrombosis)   DM II (diabetes mellitus, type II), controlled   Diabetic neuropathy, type II diabetes mellitus   Parkinson disease   Generalized weakness  Time spent: [place], MD [facility] Pager [phone]. If 7 PM - 7 AM, please contact night-coverage at [email or web address], password [facility] 09/16/2014, 8:41 AM  LOS: 1 day

## 2014-09-16 NOTE — ED Notes (Addendum)
Dr. Posey Pronto wants [place] to wait for patient to urinate. Dr. Posey Pronto does not want patient cath.

## 2014-09-16 NOTE — Evaluation (Signed)
Physical Therapy Evaluation Patient Details Name: [name] [name] MRN: [id number] DOB: [birth year] Today's Date: 09/16/2014   History of Present Illness  73 yo male admitted with pancytopenia, fall, weaknss. Hx of multiple myeloma, DM, HTN, anemia, DJD, peripheral neuropathy, Parkinson's disease.  Clinical Impression  On eval, pt required Mod assist +2 for safety/mobility-able to ambulate ~60 feet with rolling walker. Tolerated activity fairly well. No family available at time of eval to discuss d/c plans. Pt may benefit from CIR consult depending on progress.     Follow Up Recommendations CIR;Home health PT;Supervision/Assistance - 24 hour (depending on progress)    Equipment Recommendations  None recommended by PT    Recommendations for Other Services Rehab consult;OT consult     Precautions / Restrictions Precautions Precautions: Fall Restrictions Weight Bearing Restrictions: No      Mobility  Bed Mobility Overal bed mobility: Needs Assistance Bed Mobility: Supine to Sit     Supine to sit: Mod assist;+2 for physical assistance;HOB elevated     General bed mobility comments: assist for LEs and trunk. Increased time. Multimodal cues for safety, technique, sequence. Utilized bedpad for scooting, positioning. Difficulty with anterior weightshifting.  Transfers Overall transfer level: Needs assistance Equipment used: Rolling walker (2 wheeled) Transfers: Sit to/from Stand Sit to Stand: Mod assist;From elevated surface         General transfer comment: assist to rise, stabilize, control descent. VCs safety, technique, hand/feet placement. Difficulty with anterior weightshifting  Ambulation/Gait Ambulation/Gait assistance: Min assist Ambulation Distance (Feet): 60 Feet Assistive device: Rolling walker (2 wheeled) Gait Pattern/deviations: Step-through pattern;Decreased stride length;Shuffle;Trunk flexed     General Gait Details: Mod cueing for posture, increased step  length bilaterally. slow gait speed. Intermittent shufflilng and festination noted.   Stairs            Wheelchair Mobility    Modified Rankin (Stroke Patients Only)       Balance Overall balance assessment: History of Falls;Needs assistance         Standing balance support: Bilateral upper extremity supported;During functional activity Standing balance-Leahy Scale: Poor                               Pertinent Vitals/Pain Pain Assessment: Faces Pain Score: 4  Pain Location: R shoulder, L hip Pain Intervention(s): Monitored during session;Repositioned    Home Living Family/patient expects to be discharged to:: Unsure Living Arrangements: Spouse/significant other Available Help at Discharge: Family;Available 24 hours/day Type of Home: House Home Access: Stairs to enter   [employer] of Steps: 4 Home Layout: One level Home Equipment: Walker - 2 wheels;Cane - single point      Prior Function Level of Independence: Needs assistance   Gait / Transfers Assistance Needed: uses walker  ADL's / Homemaking Assistance Needed: wife assisting with bathinig, dressing        Hand Dominance        Extremity/Trunk Assessment   Upper Extremity Assessment: Defer to OT evaluation           Lower Extremity Assessment: Generalized weakness      Cervical / Trunk Assessment: Kyphotic  Communication   Communication: No difficulties  Cognition Arousal/Alertness: Awake/alert Behavior During Therapy: WFL for tasks assessed/performed Overall Cognitive Status: History of cognitive impairments - at baseline (slow responses, processing)                      General Comments  Exercises        Assessment/Plan    PT Assessment Patient needs continued PT services  PT Diagnosis Difficulty walking;Abnormality of gait;Generalized weakness   PT Problem List Decreased strength;Decreased activity tolerance;Decreased balance;Decreased  mobility;Decreased cognition;Pain;Decreased knowledge of use of DME  PT Treatment Interventions DME instruction;Gait training;Functional mobility training;Therapeutic activities;Patient/family education;Balance training   PT Goals (Current goals can be found in the Care Plan section) Acute Rehab PT Goals Patient Stated Goal: none stated PT Goal Formulation: With patient Time For Goal Achievement: 09/30/14 Potential to Achieve Goals: Good    Frequency Min 3X/week   Barriers to discharge        Co-evaluation               End of Session Equipment Utilized During Treatment: Gait belt Activity Tolerance: Patient tolerated treatment well Patient left: in chair;with call bell/phone within reach;with chair alarm set (made nurse tech aware that catheter was leaking/need for +2)           Time: 1035-1055 PT Time Calculation (min): 20 min   Charges:   PT Evaluation $Initial PT Evaluation Tier I: 1 Procedure PT Treatments $Gait Training: 8-22 mins   PT G Codes:          Weston Anna, MPT Pager: [phone]

## 2014-09-16 NOTE — Consult Note (Signed)
[facility] NOTE  Patient Care Team: Kandice Hams, MD as PCP - General (Internal Medicine) Annia Belt, MD as Consulting Physician (Internal Medicine) Heath Lark, MD as Consulting Physician (Hematology and Oncology) Carola Frost, RN as Registered Nurse  CHIEF COMPLAINTS/PURPOSE OF CONSULTATION:  Multiple myeloma with profound weakness and pancytopenia  HISTORY OF PRESENTING ILLNESS:  [name] [name] 73 y.o. male is here because of profound weakness and hypotension. [name] [name] has a long-standing history of multiple myeloma and is taken care of by Dr.Gorsuch. Please refer to the note by Dr.Gorsuch from 09/08/2014 the details of his prior history. Most recently he has been on new treatment for myeloma with Elotuzumab along with Revlimid on a clinical trial that started 07/07/2014. On Friday he came to the clinic to receive the treatment but he was found to be hypotensive and he received IV fluids and was discharged home. He came back to the emergency room with severe weakness and tremor some anxiety. He was found to be pancytopenic with a white count of 2.2 and absolute neutrophil count of 1400. He also had hemoglobin of 8.2 but this morning it was up to 9.2 and severe thrombocytopenia with platelets of 27. He does not have any fevers or chills. He does not have any signs or symptoms of bleeding.  I reviewed her records extensively and collaborated the history with the patient.  MEDICAL HISTORY:  Past Medical History  Diagnosis Date  . Diabetes mellitus   . GERD (gastroesophageal reflux disease)   . Anemia   . Hypertension   . Prostatic hyperplasia   . Arthritis   . Multiple myeloma in relapse 12/24/2011  . Benign essential HTN 12/24/2011  . CRI (chronic renal insufficiency) 12/26/2011  . 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 DVT of lower extremity (deep venous thrombosis) 05/14/2011  . Hyperlipidemia, mixed 12/26/2011  . Herpes zoster infection  03/11/2010  . DM II (diabetes mellitus, type II), controlled 12/26/2011  . Pharyngitis, acute 03/21/2013  . 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 Diabetic neuropathy, type II diabetes mellitus 05/31/2013  . Urethritis 10/04/2013  . Dehydration 07/11/2014  . Confusion 08/22/2014  . Nausea with vomiting 08/22/2014  . Fever 08/22/2014  . Dysuria 08/22/2014    SURGICAL HISTORY: Past Surgical History  Procedure Laterality Date  . Foot surgery Left     SOCIAL HISTORY: History   Social History  . Marital Status: Married    Spouse Name: [name] [name]    Number of Children: 2  . Years of Education: 12   Occupational History  .     Social History Main Topics  . Smoking status: Former Smoker    Quit date: 03/08/2008  . Smokeless tobacco: Never Used  . Alcohol Use: No  . Drug Use: No  . Sexual Activity: No   Other Topics Concern  . Not on file   Social History Narrative   Patient is married [name] [name]), has 2 children   Patient is right handed   Education level is high school   Caffeine consumption is 1 cup daily    FAMILY HISTORY: Family History  Problem Relation Age of Onset  . CAD Neg Hx     ALLERGIES:  has No Known Allergies.  MEDICATIONS:  Current Facility-Administered Medications  Medication Dose Route Frequency Provider Last Rate Last Dose  . acetaminophen (TYLENOL) tablet 650 mg  650 mg Oral Q6H PRN Berle Mull, MD  Or  . acetaminophen (TYLENOL) suppository 650 mg  650 mg Rectal Q6H PRN Berle Mull, MD      . carbidopa-levodopa (SINEMET IR) 25-100 MG per tablet immediate release 1 tablet  1 tablet Oral TID Berle Mull, MD   1 tablet at 09/16/14 0948  . insulin aspart (novoLOG) injection 0-15 Units  0-15 Units Subcutaneous TID WC Berle Mull, MD   3 Units at 09/16/14 0739  . insulin aspart (novoLOG) injection 0-5 Units  0-5 Units Subcutaneous QHS Berle Mull, MD   2 Units at 09/16/14 0328  . insulin glargine (LANTUS) injection 20 Units  20  Units Subcutaneous QHS Berle Mull, MD   20 Units at 09/16/14 0319  . morphine (MS CONTIN) 12 hr tablet 30 mg  30 mg Oral Q12H Berle Mull, MD   30 mg at 09/16/14 0948  . morphine (MSIR) tablet 15 mg  15 mg Oral Q4H PRN Berle Mull, MD      . pregabalin (LYRICA) capsule 100 mg  100 mg Oral BID Berle Mull, MD   100 mg at 09/16/14 0257  . sodium chloride 0.9 % injection 3 mL  3 mL Intravenous Q12H Berle Mull, MD   3 mL at 09/16/14 0301  . valACYclovir (VALTREX) tablet 500 mg  500 mg Oral Daily Berle Mull, MD   500 mg at 09/16/14 6767   Facility-Administered Medications Ordered in Other Encounters  Medication Dose Route Frequency Provider Last Rate Last Dose  . sodium chloride 0.9 % injection 10 mL  10 mL Intravenous PRN Heath Lark, MD   10 mL at 05/09/14 1045    REVIEW OF SYSTEMS:   Constitutional: Denies fevers, chills or abnormal night sweats, tremors in his hands, profound weakness Eyes: Denies blurriness of vision, double vision or watery eyes Ears, nose, mouth, throat, and face: Denies mucositis or sore throat Respiratory: Denies cough, dyspnea or wheezes Cardiovascular: Denies palpitation, chest discomfort; lower extremity swelling Gastrointestinal:  Denies nausea, heartburn or change in bowel habits Skin: Denies abnormal skin rashes Lymphatics: Denies new lymphadenopathy or easy bruising Neurological:Denies numbness, tingling or new weaknesses Behavioral/Psych: Mood is stable, no new changes  All other systems were reviewed with the patient and are negative.  PHYSICAL EXAMINATION: ECOG PERFORMANCE STATUS: 3 - Symptomatic, >50% confined to bed  Filed Vitals:   09/16/14 0439  BP: 150/70  Pulse: 58  Temp: 97.5 F (36.4 C)  Resp: 20   Filed Weights   09/16/14 0439  Weight: 225 lb 15.5 oz (102.5 kg)    GENERAL:alert, no distress and comfortable SKIN: skin color, texture, turgor are normal, no rashes or significant lesions EYES: normal, conjunctiva are pink and  non-injected, sclera clear OROPHARYNX:no exudate, no erythema and lips, buccal mucosa, and tongue normal  NECK: supple, thyroid normal size, non-tender, without nodularity LYMPH:  no palpable lymphadenopathy in the cervical, axillary or inguinal LUNGS: clear to auscultation and percussion with normal breathing effort HEART: regular rate & rhythm and no murmurs and 3+ lower extremity edema ABDOMEN:abdomen soft, non-tender and normal bowel sounds Musculoskeletal:no cyanosis of digits and no clubbing  PSYCH: alert & oriented x 3 with fluent speech NEURO: no focal motor/sensory deficits  LABORATORY DATA:  I have reviewed the data as listed Lab Results  Component Value Date   WBC 2.2* 09/15/2014   HGB 9.2* 09/15/2014   HCT 27.0* 09/15/2014   MCV 96.5 09/15/2014   PLT 27* 09/15/2014   Lab Results  Component Value Date   NA 139 09/16/2014  K 3.6* 09/16/2014   CL 106 09/16/2014   CO2 22 09/16/2014    RADIOGRAPHIC STUDIES: I have personally reviewed the radiological reports and agreed with the findings in the report. CT of the head revealed no abnormalities Chest x-ray showed mild atelectasis and mild cardiac enlargement without congestion or edema ASSESSMENT AND PLAN:  1. Profound weakness: This is a known side effect of Elotuzumab in combination with Aldomet therapy. He is currently off treatment. I discussed with him that he needs to work with physical therapy for strengthening. In the interim his treatment for myeloma will be on hold.  2. pancytopenia: Patient has an [place] of 1400 and he doesnot have any fevers. His platelet count is 27 but he does not have any bleeding symptoms. Hence we can monitor this for now.  3. 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 I did not believe he needs any blood transfusions with hemoglobin of 9.2.  Patient will be followed on an as-needed basis. I will informed Dr.Gorsuch on Monday to assume his care from him on standpoint.    Rulon Eisenmenger,  MD @T @ 10:11 AM

## 2014-09-16 NOTE — ED Notes (Signed)
Patient is waiting on CT scan before going up

## 2014-09-16 NOTE — H&P (Addendum)
[facility] History and Physical  Patient: [name] [name]  WFU:[id number]  DOB: [birth year]  DOS: the patient was seen and examined on 09/16/2014 PCP: Kandice Hams, MD  Chief Complaint: Generalized weakness and a fall  HPI: [name] [name] is a 73 y.o. male with Past medical history of diabetes mellitus, GERD, multiple myeloma with a relapse chemotherapy, chemotherapy induced pancytopenia, DVT in the past not on any anticoagulation , peripheral neuropathy, hypertension. Patient is presenting with complaint of generalized weakness. He mentions that he has been started on new chemotherapy regimen and feels since last 2 weeks he has been having progressively worsening generalized weakness. He has been having difficulty coming out of his bed and is requiring significant assistance for mobility. Since last to 3 days this has further worsened and patient cannot lift his arm due to weakness. Patient also complained of pain in his shoulder, lower back, legs which is also progressively worsened.  This morning when he was trying to get up and sit at the edge of the bed he lost his balance and fell on the ground requiring EMS to come for help lifting. Due to is progressively worsening weakness patient's Revlimid has been on hold but he was scheduled to receive Elotuzumab today and was brought for that. He was found hypotensive and was given IV fluids and the chemotherapy was not given. After reaching home his weakness continues to worsen and they brought him to the hospital. He denies any headache, blurred vision, dizziness, lightheadedness, focal deficit. He denies any chest pain complains of shortness of breath as well as cough without any expectoration. He denies any abdominal pain nausea or vomiting but complains of pain while swallowing and sometimes choking on swallowing. He denies any diarrhea and mentions at present he is constipated and there is no bleeding. He complains of burning at the initiation  of urination. He denies any fever or chills.  The patient is coming from home. And at his baseline dependent for most of his ADL.  Review of Systems: as mentioned in the history of present illness.  A Comprehensive review of the other systems is negative.  Past Medical History  Diagnosis Date  . Diabetes mellitus   . GERD (gastroesophageal reflux disease)   . Anemia   . Hypertension   . Prostatic hyperplasia   . Arthritis   . Multiple myeloma in relapse 12/24/2011  . Benign essential HTN 12/24/2011  . CRI (chronic renal insufficiency) 12/26/2011  . 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 DVT of lower extremity (deep venous thrombosis) 05/14/2011  . Hyperlipidemia, mixed 12/26/2011  . Herpes zoster infection 03/11/2010  . DM II (diabetes mellitus, type II), controlled 12/26/2011  . Pharyngitis, acute 03/21/2013  . 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 Diabetic neuropathy, type II diabetes mellitus 05/31/2013  . Urethritis 10/04/2013  . Dehydration 07/11/2014  . Confusion 08/22/2014  . Nausea with vomiting 08/22/2014  . Fever 08/22/2014  . Dysuria 08/22/2014   Past Surgical History  Procedure Laterality Date  . Foot surgery Left    Social History:  reports that he quit smoking about 6 years ago. He has never used smokeless tobacco. He reports that he does not drink alcohol or use illicit drugs.  No Known Allergies  Family History  Problem Relation Age of Onset  . CAD Neg Hx     Prior to Admission medications   Medication Sig Start Date End Date Taking? Authorizing Provider  acetaminophen (TYLENOL) 500 MG  tablet Take 500 mg by mouth every 6 (six) hours as needed. For pain.   Yes Historical Provider, MD  aspirin EC 81 MG tablet Take 81 mg by mouth daily after breakfast.    Yes Historical Provider, MD  atorvastatin (LIPITOR) 10 MG tablet Take 10 mg by mouth at bedtime.     Yes Historical Provider, MD  carbidopa-levodopa (SINEMET IR) 25-100 MG  per tablet Take 1 tablet by mouth 3 (three) times daily.   Yes Historical Provider, MD  dexamethasone (DECADRON) 4 MG tablet Take 7 tablets (28 mg total) by mouth once a week. 07/04/14  Yes Heath Lark, MD  Insulin Glargine (LANTUS SOLOSTAR [place]) Inject 20-30 Units into the skin every evening.    Yes Historical Provider, MD  lidocaine-prilocaine (EMLA) cream Apply 1 application topically as needed. Apply to port at least 1 hour before procedure & cover. 02/23/14  Yes Annia Belt, MD  morphine (MS CONTIN) 30 MG 12 hr tablet Take 1 tablet (30 mg total) by mouth every 12 (twelve) hours. 05/23/14  Yes Heath Lark, MD  morphine (MSIR) 15 MG tablet Take 1 tablet (15 mg total) by mouth every 4 (four) hours as needed for severe pain. 05/23/14  Yes Heath Lark, MD  ondansetron (ZOFRAN-ODT) 8 MG disintegrating tablet Take 8 mg by mouth every 8 (eight) hours as needed for nausea or vomiting.   Yes Historical Provider, MD  polyethylene glycol (MIRALAX / GLYCOLAX) packet Take 17 g by mouth daily after breakfast.    Yes Historical Provider, MD  pregabalin (LYRICA) 100 MG capsule Take 1 capsule (100 mg total) by mouth 2 (two) times daily. 07/21/14  Yes Heath Lark, MD  valACYclovir (VALTREX) 500 MG tablet Take 1 tablet (500 mg total) by mouth daily. Takes once daily. 05/23/14  Yes Heath Lark, MD  lenalidomide (REVLIMID) 25 MG capsule Take 1 capsule (25 mg total) by mouth daily. 08/24/14   Heath Lark, MD    Physical Exam: Filed Vitals:   09/15/14 1908 09/15/14 2158 09/16/14 0315 09/16/14 0331  BP: 134/54 132/54 133/60   Pulse: 77 71    Temp: 98.3 F (36.8 C) 97.8 F (36.6 C)  97.8 F (36.6 C)  TempSrc: Oral Oral  Oral  Resp: 18 16 18 18   SpO2: 99% 100%  100%    General: Alert, Awake and Oriented to Time, Place and Person. Appear in mild distress Eyes: PERRL ENT: Oral Mucosa clear moist. Neck: no JVD Cardiovascular: S1 and S2 Present, no Murmur, Peripheral Pulses Present Respiratory: Bilateral Air entry equal  and Decreased, Clear to Auscultation, noCrackles, no wheezes Abdomen: Bowel Sound present, Soft and Non tender Skin: no Rash Extremities: Bilateral Pedal edema, generalized leg tenderness Neurologic: Grossly no focal neuro deficit.  Labs on Admission:  CBC:  Recent Labs Lab 09/15/14 0910 09/15/14 2158 09/15/14 2211  WBC 2.9* 2.2*  --   NEUTROABS 1.4* 1.4*  --   HGB 9.1* 8.8* 9.2*  HCT 27.3* 24.9* 27.0*  MCV 97.5 96.5  --   PLT 33* 27*  --     CMP     Component Value Date/Time   NA 139 09/16/2014 0058   NA 141 09/15/2014 0910   NA 145 06/13/2009 1503   K 3.6* 09/16/2014 0058   K 3.6 09/15/2014 0910   K 4.8* 06/13/2009 1503   CL 106 09/16/2014 0058   CL 104 05/18/2013 1113   CL 108 06/13/2009 1503   CO2 22 09/16/2014 0058   CO2 23 09/15/2014  0910   CO2 29 06/13/2009 1503   GLUCOSE 252* 09/16/2014 0058   GLUCOSE 208* 09/15/2014 0910   GLUCOSE 325* 05/18/2013 1113   GLUCOSE 114 06/13/2009 1503   BUN 17 09/16/2014 0058   BUN 17.0 09/15/2014 0910   BUN 49* 06/13/2009 1503   CREATININE 1.26 09/16/2014 0058   CREATININE 1.5* 09/15/2014 0910   CREATININE 1.33 07/22/2012 1334   CREATININE 2.9* 06/13/2009 1503   CALCIUM 7.9* 09/16/2014 0058   CALCIUM 8.3* 09/15/2014 0910   CALCIUM 10.5* 06/13/2009 1503   PROT 4.7* 09/16/2014 0058   PROT 4.9* 09/15/2014 0910   PROT 6.4 06/13/2009 1503   ALBUMIN 2.7* 09/16/2014 0058   ALBUMIN 2.8* 09/15/2014 0910   AST 10 09/16/2014 0058   AST 13 09/15/2014 0910   AST 21 06/13/2009 1503   ALT 14 09/16/2014 0058   ALT <6 09/15/2014 0910   ALT 25 06/13/2009 1503   ALKPHOS 80 09/16/2014 0058   ALKPHOS 88 09/15/2014 0910   ALKPHOS 135* 06/13/2009 1503   BILITOT 1.0 09/16/2014 0058   BILITOT 1.12 09/15/2014 0910   BILITOT 0.60 06/13/2009 1503   GFRNONAA 54* 09/16/2014 0058   GFRAA 63* 09/16/2014 0058    No results found for this basename: LIPASE, AMYLASE,  in the last 168 hours No results found for this basename: AMMONIA,  in the last 168 hours   Recent Labs Lab  09/16/14 0058  CKTOTAL 23   BNP (last 3 results) No results found for this basename: PROBNP,  in the last 8760 hours  Radiological Exams on Admission: Ct Head Wo Contrast  09/16/2014   CLINICAL DATA:  Increased generalized weakness for 2 weeks. There C and lightheaded. Slipped to the floor. History of multiple myeloma.  EXAM: CT HEAD WITHOUT CONTRAST  TECHNIQUE: Contiguous axial images were obtained from the base of the skull through the vertex without intravenous contrast.  COMPARISON:  MRI brain 02/12/2012  FINDINGS: Mild diffuse cerebral atrophy. Mild ventricular dilatation consistent with central atrophy. No mass effect or midline shift. No abnormal extra-axial fluid collections. Gray-white matter junctions are distinct. Basal cisterns are not effaced. No evidence of acute intracranial hemorrhage. No depressed skull fractures. Visualized paranasal sinuses and mastoid air cells are not opacified.  IMPRESSION: No acute intracranial abnormalities.  Chronic diffuse atrophy.   Electronically Signed   By: Lucienne Capers M.D.   On: 09/16/2014 02:32   Dg Chest Port 1 View  09/16/2014   CLINICAL DATA:  Weakness.  Shortness of breath.  Cough.  EXAM: PORTABLE CHEST - 1 VIEW  COMPARISON:  12/21/2013  FINDINGS: Shallow inspiration. Mild cardiac enlargement. Mild pulmonary vascular congestion. Atelectasis in the lung bases. No blunting of costophrenic angles. No focal airspace disease or consolidation. Power port type central venous catheter has been placed in the interval with tip overlying the cavoatrial junction. No pneumothorax. Degenerative changes in the spine.  IMPRESSION: Shallow inspiration with atelectasis in the bases. Mild cardiac enlargement and vascular congestion without edema or consolidation.   Electronically Signed   By: Lucienne Capers M.D.   On: 09/16/2014 01:26    EKG: Independently reviewed. normal sinus rhythm, nonspecific ST and T waves changes. Assessment/Plan Principal Problem:    Pancytopenia Active Problems:   Multiple myeloma in relapse   GERD (gastroesophageal reflux disease)   DVT of lower extremity (deep venous thrombosis)   DM II (diabetes mellitus, type II), controlled   Diabetic neuropathy, type II diabetes mellitus   Parkinson disease   Generalized weakness  1. Pancytopenia Fall, generalized weakness Multiple myeloma on chemotherapy  The patient is presenting with generalized weakness progressively worsening over last 2 weeks with a fall and thrombocytopenia.  CT of the head was obtained due to his thrombocytopenia and fall which does not show any acute abnormality. He had complaint of cough with shortness of breath but chest x-ray does not show any evidence of pneumonia. Mild vascular congestion is present. Patient is not hypoxic. EKG does not have any abnormality. Initial temperature was hypokalemia, hyperglycemia, low-protein, pancytopenia. Probable etiology of patient's generalized weakness is multifactorial with combination of anemia, worsening multiple myeloma, ongoing chemotherapy use primary Revilimid, and possible worsening of Parkinson's disease. At present the patient will be admitted in the hospital. We will obtain PTOT consultation. Monitor ins and outs as well as daily weight and blood count. Serial neuro checks and telemetry monitoring. Check further workup for generalized weakness. Will discussed with oncology in the morning.  2.Diabetes mellitus. Continue home Lantus and place the patient on sliding scale.  3.History of DVT. Patient has history of DVT at present not on any anticoagulation. At present anticoagulation is contraindicated due to severe anemia as well as thrombocytopenia. Currently I will hold aspirin in view of the low platelets as well. At a lower extremity Doppler in the morning.  4.Parkinson disease. At present continue with Sinemet.  Consults: Oncology in morning  DVT Prophylaxis: mechanical compression  device Nutrition: Cardiac and diabetic diet  Code Status: Full  Disposition: Admitted to inpatient in telemetry unit.  Author: Berle Mull, MD [facility] Pager: [phone] 09/16/2014,     If 7PM-7AM, please contact night-coverage [email or web address] Password [email or web address]

## 2014-09-17 ENCOUNTER — Inpatient Hospital Stay (HOSPITAL_COMMUNITY): Payer: Medicare Other

## 2014-09-17 LAB — COMPREHENSIVE METABOLIC PANEL
ALT: <5 U/L (ref 0–53)
ANION GAP: 11 (ref 5–15)
AST: 8 U/L (ref 0–37)
Albumin: 2.7 g/dL — ABNORMAL LOW (ref 3.5–5.2)
Alkaline Phosphatase: 72 U/L (ref 39–117)
BILIRUBIN TOTAL: 0.6 mg/dL (ref 0.3–1.2)
BUN: 17 mg/dL (ref 6–23)
CHLORIDE: 106 meq/L (ref 96–112)
CO2: 25 mEq/L (ref 19–32)
CREATININE: 1.68 mg/dL — AB (ref 0.50–1.35)
Calcium: 7.9 mg/dL — ABNORMAL LOW (ref 8.4–10.5)
GFR calc Af Amer: 45 mL/min — ABNORMAL LOW (ref >90)
GFR calc non Af Amer: 38 mL/min — ABNORMAL LOW (ref >90)
Glucose, Bld: 132 mg/dL — ABNORMAL HIGH (ref 70–99)
Potassium: 3.7 mEq/L (ref 3.7–5.3)
Sodium: 142 mEq/L (ref 137–147)
Total Protein: 4.8 g/dL — ABNORMAL LOW (ref 6.0–8.3)

## 2014-09-17 LAB — CBC WITH DIFFERENTIAL/PLATELET
Basophils Absolute: 0 10*3/uL (ref 0.0–0.1)
Basophils Relative: 0 % (ref 0–1)
Eosinophils Absolute: 0.1 10*3/uL (ref 0.0–0.7)
Eosinophils Relative: 5 % (ref 0–5)
HEMATOCRIT: 25.8 % — AB (ref 39.0–52.0)
Hemoglobin: 8.8 g/dL — ABNORMAL LOW (ref 13.0–17.0)
LYMPHS PCT: 28 % (ref 12–46)
Lymphs Abs: 0.6 10*3/uL — ABNORMAL LOW (ref 0.7–4.0)
MCH: 33 pg (ref 26.0–34.0)
MCHC: 34.1 g/dL (ref 30.0–36.0)
MCV: 96.6 fL (ref 78.0–100.0)
Monocytes Absolute: 0.4 10*3/uL (ref 0.1–1.0)
Monocytes Relative: 21 % — ABNORMAL HIGH (ref 3–12)
NEUTROS ABS: 0.9 10*3/uL — AB (ref 1.7–7.7)
NEUTROS PCT: 46 % (ref 43–77)
Platelets: 23 10*3/uL — CL (ref 150–400)
RBC: 2.67 MIL/uL — ABNORMAL LOW (ref 4.22–5.81)
RDW: 15.2 % (ref 11.5–15.5)
WBC: 2 10*3/uL — AB (ref 4.0–10.5)

## 2014-09-17 LAB — GLUCOSE, CAPILLARY
GLUCOSE-CAPILLARY: 100 mg/dL — AB (ref 70–99)
Glucose-Capillary: 105 mg/dL — ABNORMAL HIGH (ref 70–99)
Glucose-Capillary: 114 mg/dL — ABNORMAL HIGH (ref 70–99)
Glucose-Capillary: 103 mg/dL — ABNORMAL HIGH (ref 70–99)

## 2014-09-17 MED ORDER — FUROSEMIDE 10 MG/ML IJ SOLN
40.0000 mg | Freq: Once | INTRAMUSCULAR | Status: AC
  Administered 2014-09-17: 40 mg via INTRAVENOUS
  Filled 2014-09-17: qty 4

## 2014-09-17 NOTE — Progress Notes (Signed)
Nutrition Brief Note  Patient identified on the Malnutrition Screening Tool (MST) Report  Wt Readings from Last 15 Encounters:  09/17/14 230 lb 9.6 oz (104.6 kg)  09/08/14 222 lb (100.699 kg)  09/01/14 227 lb (102.967 kg)  08/25/14 229 lb 1.6 oz (103.919 kg)  08/17/14 231 lb (104.781 kg)  08/11/14 230 lb (104.327 kg)  08/04/14 232 lb 1.6 oz (105.28 kg)  07/21/14 233 lb 11.2 oz (106.006 kg)  07/11/14 241 lb 4.8 oz (109.453 kg)  07/07/14 233 lb 8 oz (105.915 kg)  07/04/14 230 lb 12.8 oz (104.69 kg)  06/20/14 234 lb 12.8 oz (106.505 kg)  06/19/14 236 lb (107.049 kg)  05/23/14 234 lb 14.4 oz (106.55 kg)  04/25/14 232 lb 1.6 oz (105.28 kg)    Body mass index is 32.18 kg/(m^2). Patient meets criteria for obese, class 1, based on current BMI.   Current diet order is CHO Mod Med/HH, patient is consuming approximately 75-100% of meals at this time. Labs and medications reviewed.   Pt admitted with weakness related to pancytopenia in setting of multiple myeloma. Weight is stable.  No nutrition interventions warranted at this time. If nutrition issues arise, please consult RD. Pt being considered for admission to CIR.   Brynda Greathouse, MS RD LDN Clinical Inpatient Dietitian Weekend/After hours pager: [phone]

## 2014-09-17 NOTE — Progress Notes (Signed)
PROGRESS NOTE  [name] [name] EVO:[id number] DOB: [birth year] DOA: 09/15/2014 PCP: Kandice Hams, MD  HPI: [name] [name] is a 73 y.o. male with history of diabetes mellitus, GERD, multiple myeloma with a relapse chemotherapy, chemotherapy induced pancytopenia, DVT in the past not on any anticoagulation , peripheral neuropathy, hypertension.  Patient is presenting with complaint of generalized weakness. He was diagnosed with MM in 2010 and has underwent multiple rounds of chemotherapy as well as autologous stem cell support at [facility] in 2011. Currently receiving treatment with Elotuzumab for the past 2 weeks and Revlimid.   Subjective/ 24 H Interval events - continues to feel very weak this morning  Assessment/Plan: Multiple myeloma - followed by Dr. Alvy Bimler, s/p multiple rounds of chemotherapy as well as autologous stem cell support at [facility] in 2011, with initial responses to various cycles however later with worsening serum light chains. - he is now enrolled in a clinical trial with Revlimid and Elotuzumab  - Oncology saw patient yesterday, Dr. Alvy Bimler will be back tomorrow.  Generalized profound weakness - progressively weak since starting his Elotuzumab, muscle pain and unable to walk and barely able to lift his arms up - prior functional status, ambulating with a walker - PT consult, CIR consult tomorrow - no evidence of active infection, CXR and UA unremarkable CKD stage III - baseline Cr 1.4 - 1.6, in the setting of MM - with significant fluid overload today on LE, mild crackles on exam, one time Lasix iv Pancytopenia  - in the setting of chemotherapy, no active bleeding - monitor - hold aspirin for now Parkinson's disease - patient with mild rigidity on exam, tells me that his disease is well controlled - Continue Sinemet Diabetes mellitus.  - Continue home Lantus and place the patient on sliding scale.  History of DVT - patient has history of DVT I think in 2012 - not  currently on anticoagulation due to pancytopenia  - LE DVT Doppler negative  Diet: heart healthy/carb modified Fluids: none  DVT Prophylaxis: SCD  Code Status: Full Family Communication: d/w patient  Disposition Plan: inpatient. PT to evaluate.   Consultants:  Oncology   Procedures:  None    Antibiotics Valtrex >> prior to admission  Studies  Filed Vitals:   09/16/14 0439 09/16/14 1554 09/16/14 2019 09/17/14 0513  BP: 150/70 122/63 107/65 106/56  Pulse: 58 77 75 71  Temp: 97.5 F (36.4 C) 97.6 F (36.4 C) 98.1 F (36.7 C) 98.2 F (36.8 C)  TempSrc: Oral Oral Oral Oral  Resp: 20 20 18 18   Height: 5' 11"  (1.803 m)     Weight: 102.5 kg (225 lb 15.5 oz)   104.6 kg (230 lb 9.6 oz)  SpO2: 100% 100% 97% 97%    Intake/Output Summary (Last 24 hours) at 09/17/14 1047 Last data filed at 09/17/14 0919  Gross per 24 hour  Intake    840 ml  Output    800 ml  Net     40 ml   Filed Weights   09/16/14 0439 09/17/14 0513  Weight: 102.5 kg (225 lb 15.5 oz) 104.6 kg (230 lb 9.6 oz)   Exam:  General:  NAD, ill appearing male  Cardiovascular: RRR  Respiratory: CTA biL  Abdomen: soft, non tender  MSK: 2+ edema   Neuro: generalized weakness, strength 4/5 in all 4 extremities  Data Reviewed: Basic Metabolic Panel:  Recent Labs Lab 09/15/14 0910 09/15/14 2211 09/16/14 0058 09/16/14 0945 09/17/14 0315  NA 141 141 139 141 142  K 3.6 3.4* 3.6* 3.7 3.7  CL  --  105 106 107 106  CO2 23  --  22 23 25   GLUCOSE 208* 281* 252* 163* 132*  BUN 17.0 17 17 15 17   CREATININE 1.5* 1.30 1.26 1.30 1.68*  CALCIUM 8.3*  --  7.9* 8.1* 7.9*   Liver Function Tests:  Recent Labs Lab 09/15/14 0910 09/16/14 0058 09/16/14 0945 09/17/14 0315  AST 13 10 10 8   ALT <6 14 5  <5  ALKPHOS 88 80 79 72  BILITOT 1.12 1.0 0.8 0.6  PROT 4.9* 4.7* 4.9* 4.8*  ALBUMIN 2.8* 2.7* 2.8* 2.7*   CBC:  Recent Labs Lab 09/15/14 0910 09/15/14 2158 09/15/14 2211 09/16/14 0945  09/17/14 0315  WBC 2.9* 2.2*  --  2.5* 2.0*  NEUTROABS 1.4* 1.4*  --  1.5* 0.9*  HGB 9.1* 8.8* 9.2* 9.0* 8.8*  HCT 27.3* 24.9* 27.0* 26.1* 25.8*  MCV 97.5 96.5  --  96.3 96.6  PLT 33* 27*  --  27* 23*   Cardiac Enzymes:  Recent Labs Lab 09/16/14 0058  CKTOTAL 23   CBG:  Recent Labs Lab 09/16/14 0731 09/16/14 1212 09/16/14 1724 09/16/14 2123 09/17/14 0738  GLUCAP 166* 155* 170* 152* 105*    Recent Results (from the past 240 hour(s))  TECHNOLOGIST REVIEW     Status: None   Collection Time    09/08/14 10:50 AM      Result Value Ref Range Status   Technologist Review Oc myelocyte   Final   Studies: Ct Head Wo Contrast  09/16/2014   CLINICAL DATA:  Increased generalized weakness for 2 weeks. There C and lightheaded. Slipped to the floor. History of multiple myeloma.  EXAM: CT HEAD WITHOUT CONTRAST  TECHNIQUE: Contiguous axial images were obtained from the base of the skull through the vertex without intravenous contrast.  COMPARISON:  MRI brain 02/12/2012  FINDINGS: Mild diffuse cerebral atrophy. Mild ventricular dilatation consistent with central atrophy. No mass effect or midline shift. No abnormal extra-axial fluid collections. Gray-white matter junctions are distinct. Basal cisterns are not effaced. No evidence of acute intracranial hemorrhage. No depressed skull fractures. Visualized paranasal sinuses and mastoid air cells are not opacified.  IMPRESSION: No acute intracranial abnormalities.  Chronic diffuse atrophy.   Electronically Signed   By: Lucienne Capers M.D.   On: 09/16/2014 02:32   Dg Chest Port 1 View  09/16/2014   CLINICAL DATA:  Weakness.  Shortness of breath.  Cough.  EXAM: PORTABLE CHEST - 1 VIEW  COMPARISON:  12/21/2013  FINDINGS: Shallow inspiration. Mild cardiac enlargement. Mild pulmonary vascular congestion. Atelectasis in the lung bases. No blunting of costophrenic angles. No focal airspace disease or consolidation. Power port type central venous catheter  has been placed in the interval with tip overlying the cavoatrial junction. No pneumothorax. Degenerative changes in the spine.  IMPRESSION: Shallow inspiration with atelectasis in the bases. Mild cardiac enlargement and vascular congestion without edema or consolidation.   Electronically Signed   By: Lucienne Capers M.D.   On: 09/16/2014 01:26    Scheduled Meds: . carbidopa-levodopa  1 tablet Oral TID  . furosemide  40 mg Intravenous Once  . insulin aspart  0-15 Units Subcutaneous TID WC  . insulin aspart  0-5 Units Subcutaneous QHS  . insulin glargine  20 Units Subcutaneous QHS  . morphine  30 mg Oral Q12H  . pregabalin  100 mg Oral BID  . sodium chloride  3 mL Intravenous Q12H  . valACYclovir  500 mg Oral Daily   Continuous Infusions:   Principal Problem:   Pancytopenia Active Problems:   Multiple myeloma in relapse   GERD (gastroesophageal reflux disease)   DVT of lower extremity (deep venous thrombosis)   DM II (diabetes mellitus, type II), controlled   Diabetic neuropathy, type II diabetes mellitus   Parkinson disease   Generalized weakness  Time spent: [place], MD [facility] Pager [phone]. If 7 PM - 7 AM, please contact night-coverage at [email or web address], password [facility] 09/17/2014, 10:47 AM  LOS: 2 days

## 2014-09-17 NOTE — Progress Notes (Signed)
CRITICAL VALUE ALERT  Critical value received:  PLT 23  Date of notification:  09/17/14  Time of notification:  04:28  Critical value read back:Yes.    Nurse who received alert:  F. Milagros Loll, RN, BSN  MD notified (1st page): Fredirick Maudlin, PA  Time of first page:  04:30  MD notified (2nd page):  Time of second page:   Responding MD:  Fredirick Maudlin, PA  Time MD responded:  04:32

## 2014-09-18 ENCOUNTER — Ambulatory Visit: Payer: Medicare Other

## 2014-09-18 DIAGNOSIS — N19 Unspecified kidney failure: Secondary | ICD-10-CM

## 2014-09-18 DIAGNOSIS — D709 Neutropenia, unspecified: Secondary | ICD-10-CM

## 2014-09-18 DIAGNOSIS — D630 Anemia in neoplastic disease: Secondary | ICD-10-CM

## 2014-09-18 DIAGNOSIS — C9000 Multiple myeloma not having achieved remission: Secondary | ICD-10-CM

## 2014-09-18 DIAGNOSIS — D696 Thrombocytopenia, unspecified: Secondary | ICD-10-CM

## 2014-09-18 DIAGNOSIS — Z86718 Personal history of other venous thrombosis and embolism: Secondary | ICD-10-CM

## 2014-09-18 LAB — CBC WITH DIFFERENTIAL/PLATELET
Basophils Absolute: 0 10*3/uL (ref 0.0–0.1)
Basophils Relative: 1 % (ref 0–1)
EOS ABS: 0.1 10*3/uL (ref 0.0–0.7)
Eosinophils Relative: 5 % (ref 0–5)
HEMATOCRIT: 26.2 % — AB (ref 39.0–52.0)
Hemoglobin: 8.9 g/dL — ABNORMAL LOW (ref 13.0–17.0)
Lymphocytes Relative: 31 % (ref 12–46)
Lymphs Abs: 0.7 10*3/uL (ref 0.7–4.0)
MCH: 33.1 pg (ref 26.0–34.0)
MCHC: 34 g/dL (ref 30.0–36.0)
MCV: 97.4 fL (ref 78.0–100.0)
MONO ABS: 0.6 10*3/uL (ref 0.1–1.0)
Monocytes Relative: 29 % — ABNORMAL HIGH (ref 3–12)
NEUTROS ABS: 0.8 10*3/uL — AB (ref 1.7–7.7)
Neutrophils Relative %: 34 % — ABNORMAL LOW (ref 43–77)
Platelets: 24 10*3/uL — CL (ref 150–400)
RBC: 2.69 MIL/uL — AB (ref 4.22–5.81)
RDW: 15.3 % (ref 11.5–15.5)
WBC: 2.2 10*3/uL — ABNORMAL LOW (ref 4.0–10.5)

## 2014-09-18 LAB — GLUCOSE, CAPILLARY
GLUCOSE-CAPILLARY: 67 mg/dL — AB (ref 70–99)
GLUCOSE-CAPILLARY: 75 mg/dL (ref 70–99)
GLUCOSE-CAPILLARY: 78 mg/dL (ref 70–99)
Glucose-Capillary: 85 mg/dL (ref 70–99)
Glucose-Capillary: 53 mg/dL — ABNORMAL LOW (ref 70–99)

## 2014-09-18 LAB — BASIC METABOLIC PANEL
Anion gap: 11 (ref 5–15)
BUN: 20 mg/dL (ref 6–23)
CO2: 26 meq/L (ref 19–32)
CREATININE: 1.97 mg/dL — AB (ref 0.50–1.35)
Calcium: 7.7 mg/dL — ABNORMAL LOW (ref 8.4–10.5)
Chloride: 106 mEq/L (ref 96–112)
GFR calc Af Amer: 37 mL/min — ABNORMAL LOW (ref >90)
GFR calc non Af Amer: 32 mL/min — ABNORMAL LOW (ref >90)
GLUCOSE: 74 mg/dL (ref 70–99)
Potassium: 3.4 mEq/L — ABNORMAL LOW (ref 3.7–5.3)
Sodium: 143 mEq/L (ref 137–147)

## 2014-09-18 LAB — KAPPA/LAMBDA LIGHT CHAINS
KAPPA FREE LGHT CHN: 0.03 mg/dL — AB (ref 0.33–1.94)
Kappa:Lambda Ratio: 0 — ABNORMAL LOW (ref 0.26–1.65)
LAMBDA FREE LGHT CHN: 935 mg/dL — AB (ref 0.57–2.63)

## 2014-09-18 MED ORDER — DEXTROSE 50 % IV SOLN
25.0000 mL | Freq: Once | INTRAVENOUS | Status: AC | PRN
Start: 2014-09-18 — End: 2014-09-18
  Administered 2014-09-18: 25 mL via INTRAVENOUS

## 2014-09-18 MED ORDER — INSULIN GLARGINE 100 UNIT/ML ~~LOC~~ SOLN
16.0000 [IU] | Freq: Every day | SUBCUTANEOUS | Status: DC
  Filled 2014-09-18 (×3): qty 0.16

## 2014-09-18 MED ORDER — DEXTROSE 50 % IV SOLN
INTRAVENOUS | Status: AC
  Administered 2014-09-18: 25 mL via INTRAVENOUS
  Filled 2014-09-18: qty 50

## 2014-09-18 NOTE — Progress Notes (Signed)
[name] [name]   DOB:[birth year]   GY#:[id number]   RFF#:[id number]  I have seen the patient, examined him and edited the notes as follows  Subjective: No new issues overnight. He is comfortable, resting on the recliner. Appears weak, but in no acute distress. The patient denies any recent signs or symptoms of bleeding such as spontaneous epistaxis, hematuria or hematochezia. Has persistent poor appetite.   Scheduled Meds: . carbidopa-levodopa  1 tablet Oral TID  . insulin aspart  0-15 Units Subcutaneous TID WC  . insulin aspart  0-5 Units Subcutaneous QHS  . insulin glargine  20 Units Subcutaneous QHS  . morphine  30 mg Oral Q12H  . pregabalin  100 mg Oral BID  . sodium chloride  3 mL Intravenous Q12H  . valACYclovir  500 mg Oral Daily   Continuous Infusions:  PRN Meds:acetaminophen, acetaminophen, morphine   Objective:  Filed Vitals:   09/18/14 0632  BP: 127/58  Pulse: 86  Temp: 99.3 F (37.4 C)  Resp: 18      Intake/Output Summary (Last 24 hours) at 09/18/14 0719 Last data filed at 09/18/14 0300  Gross per 24 hour  Intake    600 ml  Output   2450 ml  Net  -1850 ml    ECOG PERFORMANCE STATUS: 4  GENERAL: awake, no distress and comfortable SKIN: skin color, texture, turgor are normal, no rashes or significant lesions. No petechiae rashes EYES: normal, conjunctiva are pale and non-injected, sclera clear OROPHARYNX:no exudate, no erythema and lips, buccal mucosa, and tongue normal  NECK: supple, thyroid normal size, non-tender, without nodularity LYMPH:  no palpable lymphadenopathy in the cervical, axillary or inguinal LUNGS: clear to auscultation and percussion with normal breathing effort HEART: regular rate & rhythm and no murmurs and 1+ lower extremity edema ABDOMEN:abdomen soft, non-tender and normal bowel sounds Musculoskeletal:no cyanosis of digits and no clubbing  PSYCH: alert & oriented x 3 with fluent speech NEURO: no focal motor/sensory deficits     CBG  (last 3)   Recent Labs  09/17/14 1206 09/17/14 1702 09/17/14 2201  GLUCAP 114* 103* 100*     Labs:   Recent Labs Lab 09/15/14 0910 09/15/14 2158 09/15/14 2211 09/16/14 0945 09/17/14 0315 09/18/14 0409  WBC 2.9* 2.2*  --  2.5* 2.0* 2.2*  HGB 9.1* 8.8* 9.2* 9.0* 8.8* 8.9*  HCT 27.3* 24.9* 27.0* 26.1* 25.8* 26.2*  PLT 33* 27*  --  27* 23* 24*  MCV 97.5 96.5  --  96.3 96.6 97.4  MCH 32.5 34.1*  --  33.2 33.0 33.1  MCHC 33.3 35.3  --  34.5 34.1 34.0  RDW 15.3* 15.0  --  14.9 15.2 15.3  LYMPHSABS 1.0 0.6*  --  0.6* 0.6* 0.7  MONOABS 0.4 0.2  --  0.4 0.4 0.6  EOSABS 0.1 0  --  0.0 0.1 0.1  BASOSABS 0.0 0  --  0.0 0.0 0.0     Chemistries:    Recent Labs Lab 09/15/14 0910 09/15/14 2211 09/16/14 0058 09/16/14 0945 09/17/14 0315 09/18/14 0409  NA 141 141 139 141 142 143  K 3.6 3.4* 3.6* 3.7 3.7 3.4*  CL  --  105 106 107 106 106  CO2 23  --  _0 GLUCOSE 208* 281* 252* 163* 132* 74  BUN 17.0 _1 CREATININE 1.5* 1.30 1.26 1.30 1.68* 1.97*  CALCIUM 8.3*  --  7.9* 8.1* 7.9* 7.7*  AST 13  --  _2 --  ALT <6  --  14 5 <5  --   ALKPHOS 88  --  80 79 72  --   BILITOT 1.12  --  1.0 0.8 0.6  --     GFR Estimated Creatinine Clearance: 40.8 ml/min (by C-G formula based on Cr of 1.97).  Liver Function Tests:  Recent Labs Lab 09/15/14 0910 09/16/14 0058 09/16/14 0945 09/17/14 0315  AST _0 ALT <_1 <5  ALKPHOS 88 80 79 72  BILITOT 1.12 1.0 0.8 0.6  PROT 4.9* 4.7* 4.9* 4.8*  ALBUMIN 2.8* 2.7* 2.8* 2.7*   Urine Studies     Component Value Date/Time   COLORURINE YELLOW 09/16/2014 [place] 09/16/2014 0757   LABSPEC 1.015 09/16/2014 0757   LABSPEC 1.025 08/22/2014 1345   PHURINE 6.0 09/16/2014 0757   GLUCOSEU 250* 09/16/2014 0757   GLUCOSEU Negative 08/22/2014 1345   HGBUR TRACE* 09/16/2014 0757   BILIRUBINUR NEGATIVE 09/16/2014 0757   KETONESUR NEGATIVE 09/16/2014 0757   PROTEINUR 30* 09/16/2014 0757    UROBILINOGEN 1.0 09/16/2014 0757   UROBILINOGEN 0.2 08/22/2014 1345   NITRITE NEGATIVE 09/16/2014 0757   LEUKOCYTESUR NEGATIVE 09/16/2014 0757    Coagulation profile  Recent Labs Lab 09/16/14 0058  INR 1.43    Cardiac Enzymes:  Recent Labs Lab 09/16/14 0058  CKTOTAL 23   CBG:  Recent Labs Lab 09/16/14 2123 09/17/14 0738 09/17/14 1206 09/17/14 1702 09/17/14 2201  GLUCAP 152* 105* 114* 103* 100*   Recent Results (from the past 240 hour(s))  TECHNOLOGIST REVIEW     Status: None   Collection Time    09/08/14 10:50 AM      Result Value Ref Range Status   Technologist Review Oc myelocyte   Final       Imaging Studies:  Dg Chest Port 1 View  09/17/2014   CLINICAL DATA:  Cough, congestion, history hypertension, diabetes, multiple myeloma, chronic renal insufficiency  EXAM: PORTABLE CHEST - 1 VIEW  COMPARISON:  Portable exam 0813 hr compared to 09/16/2014  FINDINGS: RIGHT jugular power port with tip projecting over SVC near cavoatrial junction.  Enlargement of cardiac silhouette.  Mediastinal contours and pulmonary vascularity normal.  Slightly prominent RIGHT first costochondral junction unchanged.  No definite acute infiltrate, pleural effusion, or pneumothorax.  Multilevel endplate spur formation thoracic spine.  IMPRESSION: Enlargement of cardiac silhouette.  No acute abnormalities.   Electronically Signed   By: Lavonia Dana M.D.   On: 09/17/2014 10:07    Assessment/Plan: 73 y.o.  Refractory multiple myeloma  He is currently off treatment. Performance status has declined from ECOG of 2 to 4. He is not a candidate for further treatment.  Profound weakness Causes are multifactorial, unlikely to improve due to untreated cancer, poor baseline PS, poorly controlled DM, severe painful peripheral neuropathy and Parkinson's disease Physical Therapy to see today  Neutropenia Likely secondary to chemotherapy, last given on 09/01/14 ANC today is 0.8 (was 0.9 on 9/27) for WBC today  of 2.2 (was 2.0)  Continue to monitor. No need for GCSF unless septic.  Thrombocytopenia Due to myeloma, recent chemotherapy, dilution and polypharmacy Platelets are 24k (was 23k) without any bleeding issues No transfusion indicated at this time unless bleeding or less than 10,000.  Anemia  related to myeloma and and recent treatment. No bleeding issues reported. Continue to monitor, no transfusion unless bleeding or hemoglobin <8g.  Renal Failure With noted fluid overload on admission, responding to Lasix IV.  Appreciate  Primary team follow up. Patient has declined hemodialysis if his kidney function continues to deteriorate.  History of DVT Lower Extremity Dopplers on admission negative Continue DVT prophylaxis with mechanical device.  Code status and discharge planning I discussed with him and family about code status, prognosis and goals of care. Patient desired DNR/DNI and declined feeding tube and hemodialysis. I recommend consideration for [facility] place placement rather than SNF as with untreated cancer, poor baseline PS, poorly controlled DM, severe painful peripheral neuropathy and Parkinson's disease I do not believe his clinical condition will improve in the near future. His wife has indicated that she will not be able to care for him at home. He had recent recurrent falls. She was very surprised when I told her that his prognosis is likely less than 3 months, potentially less than 1 week if his kidney function continues to deteriorate and if IVF support were to be discontinued. She is not ready to make a call about changing his CODE STATUS and goals of care without talking to her daughter. I recommend palliative care visit tomorrow to readdress all the above. I will continue to follow the patient.   Other medical issues as per admitting team     **Disclaimer: This note was dictated with voice recognition software. Similar sounding words can inadvertently be transcribed  and this note may contain transcription errors which may not have been corrected upon publication of note.Sharene Butters E, PA-C 09/18/2014  7:19 AM Kylena Mole, MD 09/18/2014

## 2014-09-18 NOTE — Evaluation (Signed)
Occupational Therapy Evaluation Patient Details Name: [name] [name] MRN: [id number] DOB: 1941-09-24 Today's Date: 09/18/2014    History of Present Illness 73 yo male admitted with pancytopenia, fall, weaknss. Hx of multiple myeloma, DM, HTN, anemia, DJD, peripheral neuropathy, Parkinson's disease.   Clinical Impression   Patient is s/p admit to [facility] presenting with deficits listed below (see OT Problem List). Patient will benefit from skilled OT to increase their safety and independence with ADL and functional mobility for ADL  to facilitate discharge to venue listed below.     Follow Up Recommendations  SNF    Equipment Recommendations  None recommended by OT    Recommendations for Other Services       Precautions / Restrictions Precautions Precautions: Fall Restrictions Weight Bearing Restrictions: No      Mobility Bed Mobility Overal bed mobility: Needs Assistance Bed Mobility: Supine to Sit;Sit to Supine;Rolling Rolling: Total assist;+2 for physical assistance;+2 for safety/equipment   Supine to sit: +2 for safety/equipment;+2 for physical assistance;Total assist Sit to supine: Total assist;+2 for physical assistance;+2 for safety/equipment   General bed mobility comments: pt sitting in chair.  Total A to reposition in chair  Transfers                 General transfer comment: Unable to attempt on today. Pt unable to sit EOB without Mod-Max  assist. Used hoyer lift for transfer bed>recliner    Balance Overall balance assessment: Needs assistance;History of Falls Sitting-balance support: Bilateral upper extremity supported;Feet supported Sitting balance-Leahy Scale: Poor Sitting balance - Comments: Pt unable to sit unsupported on today. Mod-Max assist for static sitting balance.Leaning/falling posteriorly so worked on anterior Dent. Balance did not improve with time, weightshifting                                    ADL Overall  ADL's : Needs assistance/impaired                                       General ADL Comments: Pt very lethargic. Pt did follow commands for OT with BUE- but was not awake enough to eat . SLP wants pt very alert to eat.  Reinterated this to wife who wanted to feed patient.  Explained risks to pts wife and she said SLP has also explained.     Vision                 Additional Comments: eyes only open minimally - will further assess          Pertinent Vitals/Pain Pain Assessment: No/denies pain        Extremity/Trunk Assessment Upper Extremity Assessment Upper Extremity Assessment: Generalized weakness           Communication Communication Communication: No difficulties   Cognition Arousal/Alertness: Lethargic Behavior During Therapy:  (lethargic) Overall Cognitive Status: Difficult to assess (due to lethargy)                     General Comments   wife agreeable to rehab            Home Living Family/patient expects to be discharged to:: Unsure Living Arrangements: Spouse/significant other Available Help at Discharge: Family;Available 24 hours/day Type of Home: House Home Access: Stairs to enter [employer] of Steps: 4   Home Layout: One  level               Home Equipment: Walker - 2 wheels;Cane - single point          Prior Functioning/Environment Level of Independence: Needs assistance  Gait / Transfers Assistance Needed: uses walker ADL's / Homemaking Assistance Needed: wife assisting with bathinig, dressing        OT Diagnosis: Generalized weakness   OT Problem List: Decreased strength;Decreased activity tolerance   OT Treatment/Interventions: Self-care/ADL training;Patient/family education;DME and/or AE instruction    OT Goals(Current goals can be found in the care plan section) Acute Rehab OT Goals Patient Stated Goal: wife wants pt to be able to eat OT Goal Formulation: With family Time For  Goal Achievement: 09-30-14 Potential to Achieve Goals: Fair ADL Goals Pt Will Perform Eating: with min assist;sitting Pt Will Perform Grooming: with min assist;sitting  OT Frequency: Min 2X/week              End of Session Nurse Communication: Mobility status  Activity Tolerance: Patient limited by fatigue Patient left: in chair;with family/visitor present;with call bell/phone within reach;with chair alarm set   Time: 9037-9558 OT Time Calculation (min): 20 min Charges:  OT General Charges $OT Visit: 1 Procedure OT Evaluation $Initial OT Evaluation Tier I: 1 Procedure OT Treatments $Self Care/Home Management : 8-22 mins G-Codes:    Payton Mccallum D Sep 23, 2014, 2:13 PM

## 2014-09-18 NOTE — Consult Note (Signed)
Patient [name]      DOB: Jul 27, 1941      TZG:[id number]  Summary of Goals of care; full note to follow:  Initial meeting with Patient's spouse and sister in law with her husband.  Patient rousable but falls off to sleep quickly.   Family coming to grips with the fact that [name] [name] not tolerating Chemo.  [name] [name] was looking forward to hear from Dr. Alvy Bimler this afternoon.  We started to develop a relationship and talked about short term and long term goals. [name] [name] has a Living will.  His wife remembered that he did not want to be on machines but at this time she did not interpret that in terms of limiting code status.  Agrees to meeting with [place] tomorrow to try to include her daughter and perhaps the patient if he can participate.  Recommend:  1. Remeet tomorrow at 130 pm  2. Full code status stands  3.  Patient will likely not be safe at home .  Will need to talk further about disposition and goals.    245- 330 pm  [name] Brannan L. Lovena Le, MD MBA The Palliative Medicine Team at [facility] Phone: [phone] Pager: [phone] ( Use team phone after hours)

## 2014-09-18 NOTE — Progress Notes (Signed)
Hypoglycemic Event  CBG: 67  Treatment: D50 IV 25 mL  Symptoms: Sweaty  Follow-up CBG: [id number] CBG Result:85  Possible Reasons for Event: Inadequate meal intake  Comments/MD notified: hypoglycemic protocol     Illa Level  Remember to initiate Hypoglycemia Order Set & complete

## 2014-09-18 NOTE — Consult Note (Signed)
Physical Medicine and Rehabilitation Consult Reason for Consult: Deconditioning Referring Physician: Dr. Cruzita Lederer   HPI: [name] [name] is a 73 y.o. 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 He was admitted on 09/16/14 with complaints of profound weakness and pancytopenia. He was evaluated by Dr. Lindi Adie who felt that weakness was well known SE of Elotuzumab in combination with Aldomet therapy and recommended therapy for strengthening and to monitor current platelets ( 23).  BLE dopplers negative for DVT. PT evaluation done on day of admission and patient noted to be severely deconditioned. MD, PT recommending CIR.    ROS  Past Medical History  Diagnosis Date  . Diabetes mellitus   . GERD (gastroesophageal reflux disease)   . Anemia   . Hypertension   . Prostatic hyperplasia   . Arthritis   . Multiple myeloma in relapse 12/24/2011  . Benign essential HTN 12/24/2011  . CRI (chronic renal insufficiency) 12/26/2011  . 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 DVT of lower extremity (deep venous thrombosis) 05/14/2011  . Hyperlipidemia, mixed 12/26/2011  . Herpes zoster infection 03/11/2010  . DM II (diabetes mellitus, type II), controlled 12/26/2011  . Pharyngitis, acute 03/21/2013  . 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 Diabetic neuropathy, type II diabetes mellitus 05/31/2013  . Urethritis 10/04/2013  . Dehydration 07/11/2014  . Confusion 08/22/2014  . Nausea with vomiting 08/22/2014  . Fever 08/22/2014  . Dysuria 08/22/2014   Past Surgical History  Procedure Laterality Date  . Foot surgery Left     Family History  Problem Relation Age of Onset  . CAD Neg Hx     Social History:  Married. Independent with walker PTA. Needed assistance with ADLs. reports that he quit smoking about 6 years ago. He has never used smokeless tobacco. He reports that he does  not drink alcohol or use illicit drugs.   Allergies: No Known Allergies   Medications Prior to Admission  Medication Sig Dispense Refill  . acetaminophen (TYLENOL) 500 MG tablet Take 500 mg by mouth every 6 (six) hours as needed. For pain.      Marland Kitchen aspirin EC 81 MG tablet Take 81 mg by mouth daily after breakfast.       . atorvastatin (LIPITOR) 10 MG tablet Take 10 mg by mouth at bedtime.        . carbidopa-levodopa (SINEMET IR) 25-100 MG per tablet Take 1 tablet by mouth 3 (three) times daily.      Marland Kitchen dexamethasone (DECADRON) 4 MG tablet Take 7 tablets (28 mg total) by mouth once a week.  100 tablet  1  . Insulin Glargine (LANTUS SOLOSTAR [place]) Inject 20-30 Units into the skin every evening.       . lidocaine-prilocaine (EMLA) cream Apply 1 application topically as needed. Apply to port at least 1 hour before procedure & cover.  30 g  1  . morphine (MS CONTIN) 30 MG 12 hr tablet Take 1 tablet (30 mg total) by mouth every 12 (twelve) hours.  60 tablet  0  . morphine (MSIR) 15 MG tablet Take 1 tablet (15 mg total) by mouth every 4 (four) hours as needed for severe pain.  90 tablet  0  . ondansetron (ZOFRAN-ODT) 8 MG disintegrating tablet Take 8 mg by mouth every 8 (eight) hours as needed for nausea or vomiting.      Marland Kitchen  polyethylene glycol (MIRALAX / GLYCOLAX) packet Take 17 g by mouth daily after breakfast.       . pregabalin (LYRICA) 100 MG capsule Take 1 capsule (100 mg total) by mouth 2 (two) times daily.  60 capsule  6  . valACYclovir (VALTREX) 500 MG tablet Take 1 tablet (500 mg total) by mouth daily. Takes once daily.  90 tablet  3  . lenalidomide (REVLIMID) 25 MG capsule Take 1 capsule (25 mg total) by mouth daily.  21 capsule  0    Home: Home Living Family/patient expects to be discharged to:: Unsure Living Arrangements: Spouse/significant other Available Help at Discharge: Family;Available 24 hours/day Type of Home: House Home Access: Stairs to enter [employer] of Steps:  4 Home Layout: One level Home Equipment: Walker - 2 wheels;Cane - single point  Functional History: Prior Function Level of Independence: Needs assistance Gait / Transfers Assistance Needed: uses walker ADL's / Homemaking Assistance Needed: wife assisting with bathinig, dressing Functional Status:  Mobility: Bed Mobility Overal bed mobility: Needs Assistance Bed Mobility: Supine to Sit Supine to sit: Mod assist;+2 for physical assistance;HOB elevated General bed mobility comments: assist for LEs and trunk. Increased time. Multimodal cues for safety, technique, sequence. Utilized bedpad for scooting, positioning. Difficulty with anterior weightshifting. Transfers Overall transfer level: Needs assistance Equipment used: Rolling walker (2 wheeled) Transfers: Sit to/from Stand Sit to Stand: Mod assist;From elevated surface General transfer comment: assist to rise, stabilize, control descent. VCs safety, technique, hand/feet placement. Difficulty with anterior weightshifting Ambulation/Gait Ambulation/Gait assistance: Min assist Ambulation Distance (Feet): 60 Feet Assistive device: Rolling walker (2 wheeled) Gait Pattern/deviations: Step-through pattern;Decreased stride length;Shuffle;Trunk flexed General Gait Details: Mod cueing for posture, increased step length bilaterally. slow gait speed. Intermittent shufflilng and festination noted.     ADL:    Cognition: Cognition Overall Cognitive Status: History of cognitive impairments - at baseline (slow responses, processing) Cognition Arousal/Alertness: Awake/alert Behavior During Therapy: WFL for tasks assessed/performed Overall Cognitive Status: History of cognitive impairments - at baseline (slow responses, processing)  Blood pressure 127/58, pulse 86, temperature 99.3 F (37.4 C), temperature source Oral, resp. rate 18, height $RemoveBe'5\' 11"'FsxtWRIKa$  (1.803 m), weight 106.1 kg (233 lb 14.5 oz), SpO2 96.00%. Physical Exam  Results for orders  placed during the hospital encounter of 09/15/14 (from the past 24 hour(s))  GLUCOSE, CAPILLARY     Status: Abnormal   Collection Time    09/17/14 12:06 PM      Result Value Ref Range   Glucose-Capillary 114 (*) 70 - 99 mg/dL  GLUCOSE, CAPILLARY     Status: Abnormal   Collection Time    09/17/14  5:02 PM      Result Value Ref Range   Glucose-Capillary 103 (*) 70 - 99 mg/dL  GLUCOSE, CAPILLARY     Status: Abnormal   Collection Time    09/17/14 10:01 PM      Result Value Ref Range   Glucose-Capillary 100 (*) 70 - 99 mg/dL   Comment 1 Documented in Chart     Comment 2 Notify RN    BASIC METABOLIC PANEL     Status: Abnormal   Collection Time    09/18/14  4:09 AM      Result Value Ref Range   Sodium 143  137 - 147 mEq/L   Potassium 3.4 (*) 3.7 - 5.3 mEq/L   Chloride 106  96 - 112 mEq/L   CO2 26  19 - 32 mEq/L   Glucose, Bld 74  70 -  99 mg/dL   BUN 20  6 - 23 mg/dL   Creatinine, Ser 1.97 (*) 0.50 - 1.35 mg/dL   Calcium 7.7 (*) 8.4 - 10.5 mg/dL   GFR calc non Af Amer 32 (*) >90 mL/min   GFR calc Af Amer 37 (*) >90 mL/min   Anion gap 11  5 - 15  CBC WITH DIFFERENTIAL     Status: Abnormal   Collection Time    09/18/14  4:09 AM      Result Value Ref Range   WBC 2.2 (*) 4.0 - 10.5 K/uL   RBC 2.69 (*) 4.22 - 5.81 MIL/uL   Hemoglobin 8.9 (*) 13.0 - 17.0 g/dL   HCT 26.2 (*) 39.0 - 52.0 %   MCV 97.4  78.0 - 100.0 fL   MCH 33.1  26.0 - 34.0 pg   MCHC 34.0  30.0 - 36.0 g/dL   RDW 15.3  11.5 - 15.5 %   Platelets 24 (*) 150 - 400 K/uL   Neutrophils Relative % 34 (*) 43 - 77 %   Lymphocytes Relative 31  12 - 46 %   Monocytes Relative 29 (*) 3 - 12 %   Eosinophils Relative 5  0 - 5 %   Basophils Relative 1  0 - 1 %   Neutro Abs 0.8 (*) 1.7 - 7.7 K/uL   Lymphs Abs 0.7  0.7 - 4.0 K/uL   Monocytes Absolute 0.6  0.1 - 1.0 K/uL   Eosinophils Absolute 0.1  0.0 - 0.7 K/uL   Basophils Absolute 0.0  0.0 - 0.1 K/uL   WBC Morphology DOHLE BODIES    GLUCOSE, CAPILLARY     Status: Abnormal    Collection Time    09/18/14  7:56 AM      Result Value Ref Range   Glucose-Capillary 67 (*) 70 - 99 mg/dL   Dg Chest Port 1 View  09/17/2014   CLINICAL DATA:  Cough, congestion, history hypertension, diabetes, multiple myeloma, chronic renal insufficiency  EXAM: PORTABLE CHEST - 1 VIEW  COMPARISON:  Portable exam 0813 hr compared to 09/16/2014  FINDINGS: RIGHT jugular power port with tip projecting over SVC near cavoatrial junction.  Enlargement of cardiac silhouette.  Mediastinal contours and pulmonary vascularity normal.  Slightly prominent RIGHT first costochondral junction unchanged.  No definite acute infiltrate, pleural effusion, or pneumothorax.  Multilevel endplate spur formation thoracic spine.  IMPRESSION: Enlargement of cardiac silhouette.  No acute abnormalities.   Electronically Signed   By: Lavonia Dana M.D.   On: 09/17/2014 10:07    Assessment/Plan: Diagnosis: progressive weakness/deconditioning related to chemotherapy 1. Does the need for close, 24 hr/day medical supervision in concert with the patient's rehab needs make it unreasonable for this patient to be served in a less intensive setting? Yes 2. Co-Morbidities requiring supervision/potential complications: GERD, DM2 dm with neuropathy 3. Due to bladder management, bowel management, safety, skin/wound care, disease management, medication administration, pain management and patient education, does the patient require 24 hr/day rehab nursing? Yes 4. Does the patient require coordinated care of a physician, rehab nurse, PT (1-2 hrs/day, 5 days/week) and OT (1-2 hrs/day, 5 days/week) to address physical and functional deficits in the context of the above medical diagnosis(es)? Yes Addressing deficits in the following areas: balance, endurance, locomotion, strength, transferring, bowel/bladder control, bathing, dressing, feeding, grooming and toileting 5. Can the patient actively participate in an intensive therapy program of at least  3 hrs of therapy per day at least 5 days per week?  Yes 6. The potential for patient to make measurable gains while on inpatient rehab is excellent 7. Anticipated functional outcomes upon discharge from inpatient rehab are modified independent and supervision  with PT, modified independent and supervision with OT, n/a with SLP. 8. Estimated rehab length of stay to reach the above functional goals is: 7-10 days 9. Does the patient have adequate social supports to accommodate these discharge functional goals? Yes 10. Anticipated D/C setting: Home 11. Anticipated post D/C treatments: HH therapy and Outpatient therapy 12. Overall Rehab/Functional Prognosis: good  RECOMMENDATIONS: This patient's condition is appropriate for continued rehabilitative care in the following setting: CIR Patient has agreed to participate in recommended program. Yes Note that insurance prior authorization may be required for reimbursement for recommended care.  Comment: Rehab Admissions Coordinator to follow up.  Thanks,  Meredith Staggers, MD, Mellody Drown     09/18/2014

## 2014-09-18 NOTE — Progress Notes (Signed)
Rehab Admissions Coordinator Note:  Patient was screened by Retta Diones for appropriateness for an Inpatient Acute Rehab Consult.  At this time, an inpatient rehab consult has been ordered and is pending completion.  I will follow up once consult is completed.   Retta Diones 09/18/2014, 9:47 AM  I can be reached at [phone].

## 2014-09-18 NOTE — Progress Notes (Addendum)
Clinical Social Work Department BRIEF PSYCHOSOCIAL ASSESSMENT 09/18/2014  Patient: [name] [name]    Account Number: [id number]  Admit date:  09/15/2014  Clinical Social Worker:  Lacie Scotts  Date/Time:  09/18/2014 03:28 PM  Referred by:  Physician  Date Referred:  09/18/2014 Referred for  SNF Placement   Other Referral:   Interview type:  Family Other interview type:    PSYCHOSOCIAL DATA Living Status:  WIFE Admitted from facility:   Level of care:   Primary support name:  Rosaria Ferries Primary support relationship to patient:  SPOUSE Degree of support available:   supportive    CURRENT CONCERNS Current Concerns  Post-Acute Placement   Other Concerns:    SOCIAL WORK ASSESSMENT / PLAN Pt is a 73 yr old gentleman living at home, with spouse, prior to hospitalization. CSW met with pt / family to assist with d/c planning. PN reviewed . CIR has consulted and a decision is pending. It is unclear, at this point, if pt can tolerate acute rehab. CSW has spoken to pt/family about [facility] at [facility]. They will consider this option. SNF search has been initiated and bed offers pending. Palliative Care Team has also been consulted. CSW will review recommendations, when available, to further assist with d/c planning.   Assessment/plan status:  Psychosocial Support/Ongoing Assessment of Needs Other assessment/ plan:   Information/referral to community resources:   SNF list provided. ST Rehab vs LTC at SNF reviewed.    PATIENT'S/FAMILY'S RESPONSE TO PLAN OF CARE: Pt was lethargic during csw visit. Family does not want pt in a " nursing home."  They would like pt to return home following rehab.    Werner Lean LCSW [phone]

## 2014-09-18 NOTE — Progress Notes (Signed)
Clinical Social Work Department CLINICAL SOCIAL WORK PLACEMENT NOTE 09/18/2014  Patient:  [name] [name], [name] [name]  Account Number:  [id number] [place] date:  09/15/2014  Clinical Social Worker:  Werner Lean, LCSW  Date/time:  09/18/2014 03:55 PM  Clinical Social Work is seeking post-discharge placement for this patient at the following level of care:   [place]   (*CSW will update this form in Epic as items are completed)   09/18/2014  Patient/family provided with [place] Department of Clinical Social Work's list of facilities offering this level of care within the geographic area requested by the patient (or if unable, by the patient's family).  09/18/2014  Patient/family informed of their freedom to choose among providers that offer the needed level of care, that participate in Medicare, Medicaid or managed care program needed by the patient, have an available bed and are willing to accept the patient.    Patient/family informed of [facility]' ownership interest in [facility], as well as of the fact that they are under no obligation to receive care at this facility.  PASARR submitted to EDS on  PASARR number received on   FL2 transmitted to all facilities in geographic area requested by pt/family on  09/18/2014 FL2 transmitted to all facilities within larger geographic area on   Patient informed that his/her managed care company has contracts with or will negotiate with  certain facilities, including the following:     Patient/family informed of bed offers received:   Patient chooses bed at  Physician recommends and patient chooses bed at    Patient to be transferred to  on   Patient to be transferred to facility by  Patient and family notified of transfer on  Name of family member notified:    The following physician request were entered in Epic:   Additional Comments:  Werner Lean LCSW [phone]

## 2014-09-18 NOTE — Progress Notes (Signed)
Physical Therapy Treatment Patient Details Name: Jaxsen Bernhart MRN: [id number] DOB: [birth year] Today's Date: 09/18/2014    History of Present Illness 73 yo male admitted with pancytopenia, fall, weaknss. Hx of multiple myeloma, DM, HTN, anemia, DJD, peripheral neuropathy, Parkinson's disease.    PT Comments    Pt was total assist for mobility this session (was able to ambulate on last session Saturday). Pt unable to follow commands, initiate any mobility tasks on today. Worked on static sitting balance at EOB. Used hoyer lift for transfer bed>recliner. May need to consider SNF placement vs CIR, depending on progress.   Follow Up Recommendations  CIR;SNF;Supervision/Assistance - 24 hour (depending on progress)     Equipment Recommendations  Wheelchair (measurements PT);Wheelchair cushion (measurements PT);Hospital bed (possibly)    Recommendations for Other Services       Precautions / Restrictions Precautions Precautions: Fall Restrictions Weight Bearing Restrictions: No    Mobility  Bed Mobility Overal bed mobility: Needs Assistance Bed Mobility: Supine to Sit;Sit to Supine;Rolling Rolling: Total assist;+2 for physical assistance;+2 for safety/equipment   Supine to sit: +2 for safety/equipment;+2 for physical assistance;Total assist Sit to supine: Total assist;+2 for physical assistance;+2 for safety/equipment   General bed mobility comments: total assist for all bed mobility on today. Pt did not initiate any tasks/follow any commands. Would respond "okay" but could not perform task. Utilized bed pad for scooting, positioning.   Transfers                 General transfer comment: Unable to attempt on today. Pt unable to sit EOB without Mod-Max  assist. Used hoyer lift for transfer bed>recliner  Ambulation/Gait                 Stairs            Wheelchair Mobility    Modified Rankin (Stroke Patients Only)       Balance Overall balance  assessment: Needs assistance;History of Falls Sitting-balance support: Bilateral upper extremity supported;Feet supported Sitting balance-Leahy Scale: Poor Sitting balance - Comments: Pt unable to sit unsupported on today. Mod-Max assist for static sitting balance.Leaning/falling posteriorly so worked on anterior [place]. Balance did not improve with time, weightshifting                            Cognition Arousal/Alertness: Awake/alert Behavior During Therapy: Flat affect Overall Cognitive Status: History of cognitive impairments - at baseline                      Exercises      General Comments        Pertinent Vitals/Pain Pain Assessment: No/denies pain    Home Living                      Prior Function            PT Goals (current goals can now be found in the care plan section) Progress towards PT goals: Not progressing toward goals - comment (Increased assistance this session-pt unable to physically perform any tasks on today)    Frequency  Min 3X/week    PT Plan Discharge plan needs to be updated    Co-evaluation             End of Session Equipment Utilized During Treatment: Gait belt Activity Tolerance: Patient tolerated treatment well Patient left: in chair;with call bell/phone within reach;with chair alarm set  Time: 1610-9604 PT Time Calculation (min): 32 min  Charges:  $Therapeutic Activity: 23-37 mins                    G Codes:      Weston Anna, MPT Pager: [phone]

## 2014-09-18 NOTE — Progress Notes (Addendum)
PROGRESS NOTE  [name] [name] HDQ:[id number] DOB: 1941/07/14 DOA: 09/15/2014 PCP: Kandice Hams, MD  HPI: [name] [name] is a 73 y.o. male with history of diabetes mellitus, GERD, multiple myeloma with a relapse chemotherapy, chemotherapy induced pancytopenia, DVT in the past not on any anticoagulation , peripheral neuropathy, hypertension.  Patient is presenting with complaint of generalized weakness. He was diagnosed with MM in 2010 and has underwent multiple rounds of chemotherapy as well as autologous stem cell support at [facility] in 2011. Currently receiving treatment with Elotuzumab for the past 2 weeks and Revlimid.   Subjective/ 24 H Interval events - continues to feel weak this morning, PT recommending CIR  Assessment/Plan: Multiple myeloma - followed by Dr. Alvy Bimler, s/p multiple rounds of chemotherapy as well as autologous stem cell support at [facility] in 2011, with initial responses to various cycles however later with worsening serum light chains. - he is now enrolled in a clinical trial with Revlimid and Elotuzumab which is now on hold due to his weakness - I discussed with Dr. Alvy Bimler this morning, apparently he is quite weak at home as well and this may not be far off of his baseline  - also will consult Palliative, discussed with patient as well Generalized profound weakness - progressively weak since starting his Elotuzumab, muscle pain and unable to walk and barely able to lift his arms up - PT consult - no evidence of active infection, CXR and UA unremarkable CKD stage III - baseline Cr 1.4 - 1.6, in the setting of MM - with significant fluid overload today on LE, mild crackles on exam, one time Lasix iv on 9/27, edema better 9/28, hold off further diuresis, Cr bumped  Pancytopenia  - in the setting of chemotherapy, no active bleeding - monitor - hold aspirin for now Parkinson's disease - patient with mild rigidity on exam, tells me that his disease is well controlled -  Continue Sinemet Diabetes mellitus.  - Continue home Lantus and place the patient on sliding scale.  History of DVT - patient has history of DVT I think in 2012 - not currently on anticoagulation due to pancytopenia  - LE DVT Doppler negative  Diet: heart healthy/carb modified Fluids: none  DVT Prophylaxis: SCD  Code Status: Full Family Communication: d/w patient  Disposition Plan: inpatient.  Consultants:  Oncology   Procedures:  None    Antibiotics Valtrex >> prior to admission  Studies  Filed Vitals:   09/17/14 0513 09/17/14 1429 09/17/14 2158 09/18/14 0632  BP: 106/56 107/66 104/66 127/58  Pulse: 71 82 84 86  Temp: 98.2 F (36.8 C) 98 F (36.7 C) 98.4 F (36.9 C) 99.3 F (37.4 C)  TempSrc: Oral Oral Oral Oral  Resp: _0 Height:      Weight: 104.6 kg (230 lb 9.6 oz)   106.1 kg (233 lb 14.5 oz)  SpO2: 97% 98% 100% 96%    Intake/Output Summary (Last 24 hours) at 09/18/14 1056 Last data filed at 09/18/14 0300  Gross per 24 hour  Intake    120 ml  Output   2450 ml  Net  -2330 ml   Filed Weights   09/16/14 0439 09/17/14 0513 09/18/14 1194  Weight: 102.5 kg (225 lb 15.5 oz) 104.6 kg (230 lb 9.6 oz) 106.1 kg (233 lb 14.5 oz)   Exam:  General:  NAD, ill appearing male  Cardiovascular: RRR  Respiratory: CTA biL  Abdomen: soft, non tender  MSK: 2+ edema   Neuro: generalized  weakness, strength 4/5 in all 4 extremities  Data Reviewed: Basic Metabolic Panel:  Recent Labs Lab 09/15/14 0910 09/15/14 2211 09/16/14 0058 09/16/14 0945 09/17/14 0315 09/18/14 0409  NA 141 141 139 141 142 143  K 3.6 3.4* 3.6* 3.7 3.7 3.4*  CL  --  105 106 107 106 106  CO2 23  --  _0 GLUCOSE 208* 281* 252* 163* 132* 74  BUN 17.0 _1 CREATININE 1.5* 1.30 1.26 1.30 1.68* 1.97*  CALCIUM 8.3*  --  7.9* 8.1* 7.9* 7.7*   Liver Function Tests:  Recent Labs Lab 09/15/14 0910 09/16/14 0058 09/16/14 0945 09/17/14 0315  AST _2 ALT <_3 <5  ALKPHOS 88 80 79 72  BILITOT 1.12 1.0 0.8 0.6  PROT 4.9* 4.7* 4.9* 4.8*  ALBUMIN 2.8* 2.7* 2.8* 2.7*   CBC:  Recent Labs Lab 09/15/14 0910 09/15/14 2158 09/15/14 2211 09/16/14 0945 09/17/14 0315 09/18/14 0409  WBC 2.9* 2.2*  --  2.5* 2.0* 2.2*  NEUTROABS 1.4* 1.4*  --  1.5* 0.9* 0.8*  HGB 9.1* 8.8* 9.2* 9.0* 8.8* 8.9*  HCT 27.3* 24.9* 27.0* 26.1* 25.8* 26.2*  MCV 97.5 96.5  --  96.3 96.6 97.4  PLT 33* 27*  --  27* 23* 24*   Cardiac Enzymes:  Recent Labs Lab 09/16/14 0058  CKTOTAL 23   CBG:  Recent Labs Lab 09/17/14 1206 09/17/14 1702 09/17/14 2201 09/18/14 0756 09/18/14 0917  GLUCAP 114* 103* 100* 67* 85    No results found for this or any previous visit (from the past 240 hour(s)). Studies: Dg Chest Port 1 View  09/17/2014   CLINICAL DATA:  Cough, congestion, history hypertension, diabetes, multiple myeloma, chronic renal insufficiency  EXAM: PORTABLE CHEST - 1 VIEW  COMPARISON:  Portable exam 0813 hr compared to 09/16/2014  FINDINGS: RIGHT jugular power port with tip projecting over SVC near cavoatrial junction.  Enlargement of cardiac silhouette.  Mediastinal contours and pulmonary vascularity normal.  Slightly prominent RIGHT first costochondral junction unchanged.  No definite acute infiltrate, pleural effusion, or pneumothorax.  Multilevel endplate spur formation thoracic spine.  IMPRESSION: Enlargement of cardiac silhouette.  No acute abnormalities.   Electronically Signed   By: Lavonia Dana M.D.   On: 09/17/2014 10:07    Scheduled Meds: . carbidopa-levodopa  1 tablet Oral TID  . insulin aspart  0-15 Units Subcutaneous TID WC  . insulin aspart  0-5 Units Subcutaneous QHS  . insulin glargine  16 Units Subcutaneous QHS  . morphine  30 mg Oral Q12H  . pregabalin  100 mg Oral BID  . sodium chloride  3 mL Intravenous Q12H  . valACYclovir  500 mg Oral Daily   Continuous Infusions:   Principal Problem:   Pancytopenia Active Problems:    Multiple myeloma in relapse   GERD (gastroesophageal reflux disease)   DVT of lower extremity (deep venous thrombosis)   DM II (diabetes mellitus, type II), controlled   Diabetic neuropathy, type II diabetes mellitus   Parkinson disease   Generalized weakness  Time spent: [place], MD [facility] Pager [phone]. If 7 PM - 7 AM, please contact night-coverage at [email or web address], password [facility] 09/18/2014, 10:56 AM  LOS: 3 days

## 2014-09-18 NOTE — Progress Notes (Signed)
Report received from [place], [place]. No change from initial pm assessment. Will continue to follow the POC.

## 2014-09-18 NOTE — Progress Notes (Signed)
Inpatient Diabetes Program Recommendations  AACE/ADA: New Consensus Statement on Inpatient Glycemic Control (2013)  Target Ranges:  Prepandial:   less than 140 mg/dL      Peak postprandial:   less than 180 mg/dL (1-2 hours)      Critically ill patients:  140 - 180 mg/dL     Results for [name] [name], [name] [name] (MRN [id number]) as of 09/18/2014 10:20  Ref. Range 09/18/2014 07:56 09/18/2014 09:17  Glucose-Capillary Latest Range: 70-99 mg/dL 67 (L) 85     Mild hypoglycemia this AM after getting Lantus 20 units last PM.    MD- If patient continues to have Hypoglycemia in the morning, may want to consider reducing Lantus dose by 20% to Lantus 16 units QHS    Will follow Wyn Quaker RN, MSN, CDE Diabetes Coordinator Inpatient Diabetes Program Team Pager: [phone] (8a-10p)

## 2014-09-18 NOTE — Evaluation (Signed)
Clinical/Bedside Swallow Evaluation Patient Details  Name: [name] [name] MRN: [id number] Date of Birth: February 14, 1941  Today's Date: 09/18/2014 Time: 6256-3893 SLP Time Calculation (min): 38 min  Past Medical History:  Past Medical History  Diagnosis Date  . Diabetes mellitus   . GERD (gastroesophageal reflux disease)   . Anemia   . Hypertension   . Prostatic hyperplasia   . Arthritis   . Multiple myeloma in relapse 12/24/2011  . Benign essential HTN 12/24/2011  . CRI (chronic renal insufficiency) 12/26/2011  . 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 DVT of lower extremity (deep venous thrombosis) 05/14/2011  . Hyperlipidemia, mixed 12/26/2011  . Herpes zoster infection 03/11/2010  . DM II (diabetes mellitus, type II), controlled 12/26/2011  . Pharyngitis, acute 03/21/2013  . 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 Diabetic neuropathy, type II diabetes mellitus 05/31/2013  . Urethritis 10/04/2013  . Dehydration 07/11/2014  . Confusion 08/22/2014  . Nausea with vomiting 08/22/2014  . Fever 08/22/2014  . Dysuria 08/22/2014   Past Surgical History:  Past Surgical History  Procedure Laterality Date  . Foot surgery Left    HPI:  73 yo male adm to [facility] after slipping to floor at home.  Pt with PMH of diabetes mellitus, GERD, multiple myeloma with a relapse chemotherapy, chemotherapy induced pancytopenia, DVT in the past not on any anticoagulation , peripheral neuropathy, hypertension.  Pt takes a medication for motor disease.  Swallow evalution ordered.     Assessment / Plan / Recommendation Clinical Impression  SLP suspects pt with sensori-motor related dysphagia (note pt on Sinemet and spouse states "they tried to give him Parkinson's) exacerbated by current lethargy and deconditioning.  Pt assisted to self feed but demonstrated delays in oral transiting and suspected delayed pharyngeal swallow initiation.  Pt was rigid and did not open mouth widely to  allow SLP to view.   No overt clinical indications of aspiration noted, however pt is at high risk if fed when not fully alert.  Spouse present and educated extensively to results of BSE, diet recommendations and compensation strategies.  Will follow up for further education with pt/spouse.      Aspiration Risk  Moderate    Diet Recommendation Dysphagia 2 (Fine chop);Thin liquid (spouse stated pt did not tolerate Ensure well previously, caused diarrhea)   Liquid Administration via: Straw Medication Administration: Crushed with puree Supervision: Full supervision/cueing for compensatory strategies;Trained caregiver to feed patient (spouse) Compensations: Slow rate;Small sips/bites;Check for pocketing (fully alert!) Postural Changes and/or Swallow Maneuvers: Seated upright 90 degrees;Out of bed for meals    Other  Recommendations Oral Care Recommendations: Oral care before and after PO (oral suction)   Follow Up Recommendations   (tbd)    Frequency and Duration min 2x/week  2 weeks   Pertinent Vitals/Pain Low grade temperature, decreased      Swallow Study Prior Functional Status   Pt able to self feed prior to admission, spouse cutting up some of his food at home.     General Date of Onset: 09/18/14 HPI: 73 yo male adm to [facility] after slipping to floor at home.  Pt with PMH of diabetes mellitus, GERD, multiple myeloma with a relapse chemotherapy, chemotherapy induced pancytopenia, DVT in the past not on any anticoagulation , peripheral neuropathy, hypertension.  Pt takes a medication for motor disease.  Swallow evalution ordered.   Type of Study: Bedside swallow evaluation Diet Prior to this Study: Regular;Thin liquids Temperature Spikes Noted: No Respiratory  Status: Room air History of Recent Intubation: No Behavior/Cognition: Cooperative;Lethargic;Requires cueing;Doesn't follow directions;Decreased sustained attention Oral Cavity - Dentition: Adequate natural dentition (upper  partial) Self-Feeding Abilities: Total assist Patient Positioning: Upright in bed Baseline Vocal Quality: Low vocal intensity;Hoarse Volitional Cough: Weak Volitional Swallow: Unable to elicit    Oral/Motor/Sensory Function Overall Oral Motor/Sensory Function:  (limited evaluation secondary to lethargy, generalized weakness/? rigidity, no focal CN deficits from eval tasks completed)   Ice Chips Ice chips: Not tested   Thin Liquid Thin Liquid: Impaired Presentation: Straw Oral Phase Impairments: Impaired anterior to posterior transit Oral Phase Functional Implications: Prolonged oral transit;Oral holding Pharyngeal  Phase Impairments: Suspected delayed Swallow    Nectar Thick Nectar Thick Liquid: Not tested   Honey Thick Honey Thick Liquid: Not tested   Puree Puree: Impaired Presentation: Spoon Oral Phase Impairments: Reduced lingual movement/coordination;Impaired anterior to posterior transit Oral Phase Functional Implications: Prolonged oral transit;Oral holding Pharyngeal Phase Impairments: Suspected delayed Swallow   Solid   GO    Solid: Impaired Oral Phase Impairments: Reduced lingual movement/coordination;Impaired anterior to posterior transit;Impaired mastication Oral Phase Functional Implications: Other (comment) (prolonged transiting) Pharyngeal Phase Impairments: Suspected delayed Elonda Husky, [place] [facility] [place] [phone]

## 2014-09-19 ENCOUNTER — Ambulatory Visit: Payer: Medicare Other

## 2014-09-19 DIAGNOSIS — Z515 Encounter for palliative care: Secondary | ICD-10-CM

## 2014-09-19 LAB — GLUCOSE, CAPILLARY
GLUCOSE-CAPILLARY: 82 mg/dL (ref 70–99)
GLUCOSE-CAPILLARY: 91 mg/dL (ref 70–99)
Glucose-Capillary: 115 mg/dL — ABNORMAL HIGH (ref 70–99)
Glucose-Capillary: 57 mg/dL — ABNORMAL LOW (ref 70–99)
Glucose-Capillary: 92 mg/dL (ref 70–99)
Glucose-Capillary: 85 mg/dL (ref 70–99)
Glucose-Capillary: 87 mg/dL (ref 70–99)
Glucose-Capillary: 78 mg/dL (ref 70–99)

## 2014-09-19 LAB — BASIC METABOLIC PANEL
Anion gap: 11 (ref 5–15)
BUN: 21 mg/dL (ref 6–23)
CO2: 26 mEq/L (ref 19–32)
CREATININE: 1.74 mg/dL — AB (ref 0.50–1.35)
Calcium: 8 mg/dL — ABNORMAL LOW (ref 8.4–10.5)
Chloride: 108 mEq/L (ref 96–112)
GFR calc non Af Amer: 37 mL/min — ABNORMAL LOW (ref >90)
GFR, EST AFRICAN AMERICAN: 43 mL/min — AB (ref >90)
Glucose, Bld: 100 mg/dL — ABNORMAL HIGH (ref 70–99)
Potassium: 3.3 mEq/L — ABNORMAL LOW (ref 3.7–5.3)
Sodium: 145 mEq/L (ref 137–147)

## 2014-09-19 LAB — CBC WITH DIFFERENTIAL/PLATELET
Basophils Absolute: 0 10*3/uL (ref 0.0–0.1)
Basophils Relative: 1 % (ref 0–1)
EOS ABS: 0.1 10*3/uL (ref 0.0–0.7)
Eosinophils Relative: 3 % (ref 0–5)
HCT: 26.5 % — ABNORMAL LOW (ref 39.0–52.0)
Hemoglobin: 8.9 g/dL — ABNORMAL LOW (ref 13.0–17.0)
Lymphocytes Relative: 36 % (ref 12–46)
Lymphs Abs: 0.8 10*3/uL (ref 0.7–4.0)
MCH: 33 pg (ref 26.0–34.0)
MCHC: 33.6 g/dL (ref 30.0–36.0)
MCV: 98.1 fL (ref 78.0–100.0)
MONO ABS: 0.7 10*3/uL (ref 0.1–1.0)
Monocytes Relative: 31 % — ABNORMAL HIGH (ref 3–12)
NEUTROS PCT: 29 % — AB (ref 43–77)
Neutro Abs: 0.7 10*3/uL — ABNORMAL LOW (ref 1.7–7.7)
Platelets: 29 10*3/uL — CL (ref 150–400)
RBC: 2.7 MIL/uL — ABNORMAL LOW (ref 4.22–5.81)
RDW: 15.1 % (ref 11.5–15.5)
WBC: 2.3 10*3/uL — ABNORMAL LOW (ref 4.0–10.5)

## 2014-09-19 MED ORDER — DEXTROSE 50 % IV SOLN
25.0000 mL | Freq: Once | INTRAVENOUS | Status: AC | PRN
Start: 2014-09-19 — End: 2014-09-19
  Administered 2014-09-19: 25 mL via INTRAVENOUS
  Filled 2014-09-19: qty 50

## 2014-09-19 NOTE — Progress Notes (Signed)
Speech Language Pathology Treatment: Dysphagia  Patient Details Name: [name] [name] MRN: [id number] DOB: 05-31-1941 Today's Date: 09/19/2014 Time: 6754-4920 SLP Time Calculation (min): 12 min  Assessment / Plan / Recommendation Clinical Impression  Pt continues with lethargy today and subsequently poor intake.  RN reports oral holding with medications and intake of applesauce only today.  Spouse not present during SLP session.  Pt did not verbalize if he did not desire to eat and/or if he was too sleepy.  Pt awoke enough to consume a few boluses of thin Boost supplement but quickly returns to sleep. Gross weakness resulted in decreased ability to form suction on straw and for pt to help hold his own cup for neurological input.  Pt with delayed swallow and weak delayed cough after swallows - that may be indicative of laryngeal penetration or aspiration.    .Note per chart,  palliative meeting with family again after pt sees his oncology per family request.  Given ongoing lethargy, pt is currently a high aspiration/malnutrition risk .  Strategies reviewed extensively with spouse yesterday to mitigate aspiration risk.     HPI HPI: 73 yo male adm to [facility] after slipping to floor at home.  Pt with PMH of diabetes mellitus, GERD, multiple myeloma with a relapse chemotherapy, chemotherapy induced pancytopenia, DVT in the past not on any anticoagulation , peripheral neuropathy, hypertension.  Pt takes a medication for motor disease.     Pertinent Vitals Pain Assessment: No/denies pain  SLP Plan  Continue with current plan of care    Recommendations Diet recommendations: Dysphagia 2 (fine chop);Thin liquid Liquids provided via: Cup;Straw Medication Administration: Crushed with puree Supervision: Full supervision/cueing for compensatory strategies;Staff to assist with self feeding Compensations: Slow rate;Small sips/bites;Check for pocketing (oral suction as needed) Postural Changes and/or Swallow  Maneuvers: Seated upright 90 degrees;Out of bed for meals              Oral Care Recommendations: Oral care before and after PO Follow up Recommendations: Other (comment) (tbd) Plan: Continue with current plan of care    [place], [place], [place] [facility] SLP [phone]

## 2014-09-19 NOTE — Progress Notes (Signed)
PROGRESS NOTE  [name] [name] LJQ:[id number] DOB: Jul 10, 1941 DOA: 09/15/2014 PCP: Kandice Hams, MD  HPI: [name] [name] is a 73 y.o. male with history of diabetes mellitus, GERD, multiple myeloma with a relapse chemotherapy, chemotherapy induced pancytopenia, DVT in the past not on any anticoagulation , peripheral neuropathy, hypertension.  Patient is presenting with complaint of generalized weakness. He was diagnosed with MM in 2010 and has underwent multiple rounds of chemotherapy as well as autologous stem cell support at [facility] in 2011. Currently receiving treatment with Elotuzumab for the past 2 weeks and Revlimid.   Subjective/ 24 H Interval events - continues to feel weak this morning but a bit more alert - no overnight events  Assessment/Plan: Multiple myeloma - followed by Dr. Alvy Bimler, s/p multiple rounds of chemotherapy as well as autologous stem cell support at [facility] in 2011, with initial responses to various cycles however later with worsening serum light chains. - he is now enrolled in a clinical trial with Revlimid and Elotuzumab which is now on hold due to his weakness - I discussed with Dr. Alvy Bimler 9/28, apparently he is quite weak at home as well and this may not be far off of his baseline  - palliative consulted, meeting today  Generalized profound weakness - progressively weak since starting his Elotuzumab, muscle pain and unable to walk and barely able to lift his arms up - no evidence of active infection, CXR and UA unremarkable CKD stage III - baseline Cr 1.4 - 1.6, in the setting of MM - with significant fluid overload today on LE, mild crackles on exam, one time Lasix iv on 9/27 with good UOP, Cr worsened briefly now better  Pancytopenia  - in the setting of chemotherapy, no active bleeding - monitor - hold aspirin for now Parkinson's disease - patient with mild rigidity on exam, tells me that his disease is well controlled - Continue Sinemet Diabetes mellitus.    - Continue home Lantus and place the patient on sliding scale.  History of DVT - patient has history of DVT I think in 2012 - not currently on anticoagulation due to pancytopenia  - LE DVT Doppler negative  Diet: heart healthy/carb modified Fluids: none  DVT Prophylaxis: SCD  Code Status: Full Family Communication: d/w patient  Disposition Plan: pending [place] meeting today  Consultants:  Oncology   Procedures:  None    Antibiotics Valtrex >> prior to admission  Studies  Filed Vitals:   09/18/14 0632 09/18/14 1349 09/18/14 2053 09/19/14 0435  BP: 127/58 109/46 119/49 111/49  Pulse: 86 84 72 79  Temp: 99.3 F (37.4 C) 98.8 F (37.1 C) 98.1 F (36.7 C) 98.1 F (36.7 C)  TempSrc: Oral Oral Oral Oral  Resp: _0 Height:      Weight: 106.1 kg (233 lb 14.5 oz)     SpO2: 96% 96% 97% 97%    Intake/Output Summary (Last 24 hours) at 09/19/14 1140 Last data filed at 09/19/14 1100  Gross per 24 hour  Intake    330 ml  Output    850 ml  Net   -520 ml   Filed Weights   09/16/14 0439 09/17/14 0513 09/18/14 2197  Weight: 102.5 kg (225 lb 15.5 oz) 104.6 kg (230 lb 9.6 oz) 106.1 kg (233 lb 14.5 oz)   Exam:  General:  NAD, ill appearing male  Cardiovascular: RRR  Respiratory: CTA biL  Abdomen: soft, non tender  MSK: 2+ edema   Neuro: generalized weakness, strength  4/5 in all 4 extremities  Data Reviewed: Basic Metabolic Panel:  Recent Labs Lab 09/16/14 0058 09/16/14 0945 09/17/14 0315 09/18/14 0409 09/19/14 0502  NA 139 141 142 143 145  K 3.6* 3.7 3.7 3.4* 3.3*  CL 106 107 106 106 108  CO2 _0 GLUCOSE 252* 163* 132* 74 100*  BUN _1 CREATININE 1.26 1.30 1.68* 1.97* 1.74*  CALCIUM 7.9* 8.1* 7.9* 7.7* 8.0*   Liver Function Tests:  Recent Labs Lab 09/15/14 0910 09/16/14 0058 09/16/14 0945 09/17/14 0315  AST _2 ALT <_3 <5  ALKPHOS 88 80 79 72  BILITOT 1.12 1.0 0.8 0.6  PROT 4.9* 4.7* 4.9* 4.8*   ALBUMIN 2.8* 2.7* 2.8* 2.7*   CBC:  Recent Labs Lab 09/15/14 2158 09/15/14 2211 09/16/14 0945 09/17/14 0315 09/18/14 0409 09/19/14 0502  WBC 2.2*  --  2.5* 2.0* 2.2* 2.3*  NEUTROABS 1.4*  --  1.5* 0.9* 0.8* 0.7*  HGB 8.8* 9.2* 9.0* 8.8* 8.9* 8.9*  HCT 24.9* 27.0* 26.1* 25.8* 26.2* 26.5*  MCV 96.5  --  96.3 96.6 97.4 98.1  PLT 27*  --  27* 23* 24* 29*   Cardiac Enzymes:  Recent Labs Lab 09/16/14 0058  CKTOTAL 23   CBG:  Recent Labs Lab 09/18/14 2045 09/19/14 0030 09/19/14 0047 09/19/14 0642 09/19/14 0814  GLUCAP 53* 57* 115* 92 85    No results found for this or any previous visit (from the past 240 hour(s)). Studies: No results found.  Scheduled Meds: . carbidopa-levodopa  1 tablet Oral TID  . insulin aspart  0-15 Units Subcutaneous TID WC  . insulin aspart  0-5 Units Subcutaneous QHS  . insulin glargine  16 Units Subcutaneous QHS  . morphine  30 mg Oral Q12H  . pregabalin  100 mg Oral BID  . sodium chloride  3 mL Intravenous Q12H  . valACYclovir  500 mg Oral Daily   Continuous Infusions:   Principal Problem:   Pancytopenia Active Problems:   Multiple myeloma in relapse   GERD (gastroesophageal reflux disease)   DVT of lower extremity (deep venous thrombosis)   DM II (diabetes mellitus, type II), controlled   Diabetic neuropathy, type II diabetes mellitus   Parkinson disease   Generalized weakness  Time spent: [place], MD [facility] Pager [phone]. If 7 PM - 7 AM, please contact night-coverage at [email or web address], password [facility] 09/19/2014, 11:40 AM  LOS: 4 days

## 2014-09-19 NOTE — Progress Notes (Signed)
Inpatient Rehabilitation  I met [name] at the bedside and spoke with his wife over the phone.  Pt. briefly awakened and gave 2 short verbal responses. He needed assistance to even  raise his arm up off the bed .   He fell back asleep within a couple of minutes.  [name] stated that her understanding is that her husband is "shutting down" and that "the only thing keeping him alive is the fluids they are giving him".  She does not see a role for IP rehab at this time.  I confirmed with her that pt. would not tolerate 3 hours of therapy daily .  I note that there will be another family/palliative meeting today at 1:30.   I will  sign off at this time, as unfortunately pt. cannot tolerate IP Rehab.    If there are questions, please feel free to contact me.    [place] Admissions Coordinator Cell [phone] Office [phone]

## 2014-09-19 NOTE — Progress Notes (Signed)
Patient [name] [name]      DOB: 07/16/1941      QKS:[id number]   Palliative Medicine Team at [facility] Progress Note    Subjective: Patient intermittently awakens and smiles.  Noted he still is not eating.  Spoke with his wife and daughter [name] [name] [phone]).  Son [name] [name] not able to be on conference call.  Family received news that [name]'s prognosis is quite short especially when IVF discontinued.  Family understand and want to honor his wishes to not be hooked to life support or have life sustaining treatments like feeding tubes. They are open to residential hospice per their conversation with [name] [name] .  I have updated [name] [name] the patients PCP.    Filed Vitals:   09/19/14 1441  BP: 103/54  Pulse: 80  Temp: 99.3 F (37.4 C)  Resp: 18   Physical exam:  General: No acute distress, fall off to sleep but awakens happy. Continues with edema in all extremities.   Assessment and plan:73 yr old African [place] male with multiple myeloma refractory to treatment at this time. Patient is not a candidate for further chemo and without continued IV support will laps into worsening renal failure.  The patient is not eating or drinking.  Family is electing comfort care and will be looking into residential placement as the patient has been falling at home.   1.  DNR will be placed at the families request.  2.  Pain: Intermittent per the patient  MS contin dose has been the same 30 mg q 12 with prn MSIR . Total prn use has been 0.  3. Parkinson's : continue with PO medications as long as he can take them family does not want feeding tube.  4.Acute renal failure secondary to Multiple myeloma: family understands that IV will be stopped at discharge. Daughter trying to make arrangements to get here from the [facility]. She lives in [place] and her husband is away on business.    Total time 1:40-235 pm  Discussed with Dr. Burton Apley L. Lovena Le, MD MBA The Palliative  Medicine Team at [facility] Phone: [phone] Pager: [phone] ( Use team phone after hours)

## 2014-09-19 NOTE — Progress Notes (Signed)
[name] [name]   DOB:Mar 21, 1941   WH#:[id number]   YKZ#:[id number]  I have seen the patient, examined him and edited the notes as follows  Subjective: No new issues overnight. He is comfortable, resting on the recliner. Appears very weak, but in no acute distress. The patient denies any recent signs or symptoms of bleeding such as spontaneous epistaxis, hematuria or hematochezia. Has persistent poor appetite.   Scheduled Meds: . carbidopa-levodopa  1 tablet Oral TID  . insulin aspart  0-15 Units Subcutaneous TID WC  . insulin aspart  0-5 Units Subcutaneous QHS  . insulin glargine  16 Units Subcutaneous QHS  . morphine  30 mg Oral Q12H  . pregabalin  100 mg Oral BID  . sodium chloride  3 mL Intravenous Q12H  . valACYclovir  500 mg Oral Daily   Continuous Infusions:  PRN Meds:acetaminophen, acetaminophen, morphine   Objective:  Filed Vitals:   09/19/14 0435  BP: 111/49  Pulse: 79  Temp: 98.1 F (36.7 C)  Resp: 18      Intake/Output Summary (Last 24 hours) at 09/19/14 0946 Last data filed at 09/19/14 0456  Gross per 24 hour  Intake    210 ml  Output    850 ml  Net   -640 ml    ECOG PERFORMANCE STATUS: 4  GENERAL: awake, no distress and comfortable SKIN: skin color, texture, turgor are normal, no rashes or significant lesions. No petechiae rashes EYES: normal, conjunctiva are pale and non-injected, sclera clear OROPHARYNX:no exudate, no erythema and lips, buccal mucosa, and tongue normal  NECK: supple, thyroid normal size, non-tender, without nodularity LYMPH:  no palpable lymphadenopathy in the cervical, axillary or inguinal LUNGS: clear to auscultation and percussion with normal breathing effort HEART: regular rate & rhythm and no murmurs and 1+ lower extremity edema ABDOMEN:abdomen soft, non-tender and normal bowel sounds Musculoskeletal:no cyanosis of digits and no clubbing  PSYCH:awake, but very weak appearing, responses adequate NEURO: no focal motor/sensory  deficits     CBG (last 3)   Recent Labs  09/19/14 0047 09/19/14 0642 09/19/14 0814  GLUCAP 115* 92 85     Labs:   Recent Labs Lab 09/15/14 2158 09/15/14 2211 09/16/14 0945 09/17/14 0315 09/18/14 0409 09/19/14 0502  WBC 2.2*  --  2.5* 2.0* 2.2* 2.3*  HGB 8.8* 9.2* 9.0* 8.8* 8.9* 8.9*  HCT 24.9* 27.0* 26.1* 25.8* 26.2* 26.5*  PLT 27*  --  27* 23* 24* 29*  MCV 96.5  --  96.3 96.6 97.4 98.1  MCH 34.1*  --  33.2 33.0 33.1 33.0  MCHC 35.3  --  34.5 34.1 34.0 33.6  RDW 15.0  --  14.9 15.2 15.3 15.1  LYMPHSABS 0.6*  --  0.6* 0.6* 0.7 0.8  MONOABS 0.2  --  0.4 0.4 0.6 0.7  EOSABS 0  --  0.0 0.1 0.1 0.1  BASOSABS 0  --  0.0 0.0 0.0 0.0     Chemistries:    Recent Labs Lab 09/15/14 0910  09/16/14 0058 09/16/14 0945 09/17/14 0315 09/18/14 0409 09/19/14 0502  NA 141  < > 139 141 142 143 145  K 3.6  < > 3.6* 3.7 3.7 3.4* 3.3*  CL  --   < > 106 107 106 106 108  CO2 23  --  22 23 25 26 26   GLUCOSE 208*  < > 252* 163* 132* 74 100*  BUN 17.0  < > 17 15 17 20 21   CREATININE 1.5*  < > 1.26 1.30 1.68* 1.97*  1.74*  CALCIUM 8.3*  --  7.9* 8.1* 7.9* 7.7* 8.0*  AST 13  --  10 10 8   --   --   ALT <6  --  14 5 <5  --   --   ALKPHOS 88  --  80 79 72  --   --   BILITOT 1.12  --  1.0 0.8 0.6  --   --   < > = values in this interval not displayed.  GFR Estimated Creatinine Clearance: 46.1 ml/min (by C-G formula based on Cr of 1.74).  Liver Function Tests:  Recent Labs Lab 09/15/14 0910 09/16/14 0058 09/16/14 0945 09/17/14 0315  AST 13 10 10 8   ALT <6 14 5  <5  ALKPHOS 88 80 79 72  BILITOT 1.12 1.0 0.8 0.6  PROT 4.9* 4.7* 4.9* 4.8*  ALBUMIN 2.8* 2.7* 2.8* 2.7*   Urine Studies     Component Value Date/Time   COLORURINE YELLOW 09/16/2014 [place] 09/16/2014 0757   LABSPEC 1.015 09/16/2014 0757   LABSPEC 1.025 08/22/2014 1345   PHURINE 6.0 09/16/2014 0757   GLUCOSEU 250* 09/16/2014 0757   GLUCOSEU Negative 08/22/2014 1345   HGBUR TRACE* 09/16/2014 0757    BILIRUBINUR NEGATIVE 09/16/2014 [place] 09/16/2014 0757   PROTEINUR 30* 09/16/2014 0757   UROBILINOGEN 1.0 09/16/2014 0757   UROBILINOGEN 0.2 08/22/2014 1345   NITRITE NEGATIVE 09/16/2014 0757   LEUKOCYTESUR NEGATIVE 09/16/2014 0757    Coagulation profile  Recent Labs Lab 09/16/14 0058  INR 1.43    Cardiac Enzymes:  Recent Labs Lab 09/16/14 0058  CKTOTAL 23   CBG:  Recent Labs Lab 09/18/14 2045 09/19/14 0030 09/19/14 0047 09/19/14 0642 09/19/14 0814  GLUCAP 53* 57* 115* 92 85   No results found for this or any previous visit (from the past 240 hour(s)).  Assessment/Plan: 73 y.o.  Refractory multiple myeloma  He is currently off treatment. Performance status has declined from ECOG of 2 to 4. He is not a candidate for further treatment.  Profound weakness Causes are multifactorial, unlikely to improve due to untreated cancer, poor baseline PS, poorly controlled DM, severe painful peripheral neuropathy and Parkinson's disease Physical Therapy signed off, as patient unable to tolerate inpatient rehab.  Neutropenia Likely secondary to chemotherapy, last given on 09/01/14 ANC today is 0.7 (was 0.8 on 9/28) for WBC today of 2.3 (was 2.2)  Continue to monitor. No need for GCSF unless septic.  Thrombocytopenia Due to myeloma, recent chemotherapy, dilution and polypharmacy Platelets are 29k (was 24k) without any bleeding issues No transfusion indicated at this time unless bleeding or less than 10,000.  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 No bleeding issues reported. Continue to monitor, no transfusion unless bleeding or hemoglobin <8g.  Renal Failure With noted fluid overload on admission, responding to Lasix IV.  Appreciate Primary team follow up. Patient has declined hemodialysis if his kidney function continues to deteriorate.  History of DVT Lower Extremity Dopplers on admission negative Continue DVT prophylaxis with mechanical  device.  Code status and discharge planning Code status was discussed with family and patient, including prognosis and goals of care. Patient desired DNR/DNI on 9/28, and declined feeding tube and hemodialysis. I recommend consideration for [facility] placement rather than SNF as with untreated cancer, poor baseline PS, poorly controlled DM, severe painful peripheral neuropathy and Parkinson's disease I do not believe his clinical condition will improve in the near future. His wife has indicated that  she will not be able to care for him at home. He had recent recurrent falls. She was very surprised when she was informed that his prognosis is likely less than 3 months, potentially less than 1 week if his kidney function continues to deteriorate and if IVF support were to be discontinued. She is not ready to make a call about changing his CODE STATUS and goals of care without talking to her daughter. Palliative care visit to take place today at 1:30 pm with family Will continue to follow the patient with you   Other medical issues as per admitting team   [facility] E, PA-C 09/19/2014  9:46 AM Dustin Bumbaugh, MD 09/19/2014

## 2014-09-19 NOTE — Progress Notes (Signed)
Addendum: Spoke with daughter over the phone and addressed all her concerns. Family were present. Her daughter requests that we kept supportive care going including IVF until they can get here. She is concerned that stopping IVF may hasten renal failure and death. She wants to see him alive if possible before he is transferred to [facility] place.

## 2014-09-19 NOTE — Progress Notes (Signed)
CSW assisting with d/c planning. Spoke with MD and pt's family. Pt is sleeping soundly at this time. [facility] has been recommended. Family is in agreement with this plan and has requested [facility] . Clinicals have been sent and a decision is pending. CSW will continue to follow to offer support to pt/family and assist with d/c planning to [place].  Werner Lean LCSW [phone]

## 2014-09-19 NOTE — Progress Notes (Signed)
.  Nutrition Brief Note    Discussed pt with SLP on 9/28, who expressed concern with pt's poor PO, malnourishment, and increased risk for aspiration Chart reviewed. Pt now transitioning to comfort care per palliative care meeting on 9/29 No nutrition interventions warranted at this time.  Please re-consult as needed.   Atlee Abide MS RD LDN Clinical Dietitian WGYKZ:[phone]

## 2014-09-20 ENCOUNTER — Ambulatory Visit: Payer: Medicare Other

## 2014-09-20 ENCOUNTER — Other Ambulatory Visit: Payer: Self-pay | Admitting: Hematology and Oncology

## 2014-09-20 LAB — GLUCOSE, CAPILLARY
Glucose-Capillary: 80 mg/dL (ref 70–99)
Glucose-Capillary: 100 mg/dL — ABNORMAL HIGH (ref 70–99)
Glucose-Capillary: 89 mg/dL (ref 70–99)

## 2014-09-20 MED ORDER — MORPHINE SULFATE 4 MG/ML IJ SOLN
4.0000 mg | INTRAMUSCULAR | Status: DC | PRN
Start: 2014-09-20 — End: 2014-09-21

## 2014-09-20 NOTE — Consult Note (Signed)
Reserve Liaison: Received request from [place] for family interest in [place] 9/29. Chart reviewed and spoke with patient's daughter Olin Hauser by phone per CSW request. Agreed with daughter to fax her copies of registration paper work and to meet with patient's spouse this morning which I did. Spouse visited [facility] this morning and is considering transfer to [facility] tomorrow 10/01. Spouse is requesting to continue IV fluids for a few days at [facility] and [facility] has agreed if she proceeds with transfer tomorrow 10/01. Patient's son Yvone Neu is arriving late tomorrow afternoon. Daughter Olin Hauser arriving in next few days. Plan to follow up with spouse in am to see if she wants to proceed with transfer. If not, will continue to follow and determine availability when family ready for transfer. Spouse requesting Dr. Alvy Bimler to attend. Await call back from Dr. Calton Dach RN. Family agreeable for Dr. Tomasa Hosteller to attend if Dr. Alvy Bimler unavailable. CSW and Dr. Lovena Le aware of above. Thank you. Erling Conte LCSW [phone]

## 2014-09-20 NOTE — Progress Notes (Signed)
OT Cancellation Note  Patient Details Name: Horacio Werth MRN: [id number] DOB: July 11, 1941   Cancelled Treatment:    Reason Eval/Treat Not Completed: Other (comment)Noted plan for [facility].  Will sign off at this time.   Betsy Pries 09/20/2014, 8:57 AM

## 2014-09-20 NOTE — Progress Notes (Signed)
PROGRESS NOTE  [name] [name] HUD:[id number] DOB: 20-Aug-1941 DOA: 09/15/2014 PCP: Kandice Hams, MD  Assessment/Plan: Multiple myeloma  - followed by Dr. Alvy Bimler, s/p multiple rounds of chemotherapy as well as autologous stem cell support at [facility] in 2011, with initial responses to various cycles however later with worsening serum light chains.  - he is now enrolled in a clinical trial with Revlimid and Elotuzumab which is now on hold due to his weakness  - discussed with Dr. Alvy Bimler 9/28, apparently he is quite weak at home as well and this may not be far off of his baseline  - palliative consulted-->transition to focus of comfort care -appreciate Dr. Alvy Bimler follow up and Dr. Lovena Le Generalized profound weakness  - progressively weak since starting his Elotuzumab, muscle pain and unable to walk and barely able to lift his arms up  - no evidence of active infection, CXR and UA unremarkable  CKD stage III  - baseline Cr 1.4 - 1.6, in the setting of MM  - with significant fluid overload today on LE, mild crackles on exam, one time Lasix iv on 9/27 with good UOP,  -No longer an active issue as the patient's focus of care has been transitioned to focus of comfort Pancytopenia  - in the setting of chemotherapy, no active bleeding  - monitor  - hold aspirin for now  Parkinson's disease  - patient with mild rigidity on exam, tells me that his disease is well controlled  - Continue Sinemet  Diabetes mellitus.  - This patient's focus of care has transitioned to that of comfort, discontinue CBGs, discontinue Lantus History of DVT  - not currently on anticoagulation due to pancytopenia  - LE DVT Doppler negative  -continue SCDs GOC -comfort care -as pt having difficulty swallowing-->change to IV morphine -wife wants IVF to continue for now   DVT Prophylaxis: SCD  Code Status: Full  Family Communication: wife updated at bedside Disposition Plan: [facility]          Procedures/Studies: Ct Head Wo Contrast  09/16/2014   CLINICAL DATA:  Increased generalized weakness for 2 weeks. There C and lightheaded. Slipped to the floor. History of multiple myeloma.  EXAM: CT HEAD WITHOUT CONTRAST  TECHNIQUE: Contiguous axial images were obtained from the base of the skull through the vertex without intravenous contrast.  COMPARISON:  MRI brain 02/12/2012  FINDINGS: Mild diffuse cerebral atrophy. Mild ventricular dilatation consistent with central atrophy. No mass effect or midline shift. No abnormal extra-axial fluid collections. Gray-white matter junctions are distinct. Basal cisterns are not effaced. No evidence of acute intracranial hemorrhage. No depressed skull fractures. Visualized paranasal sinuses and mastoid air cells are not opacified.  IMPRESSION: No acute intracranial abnormalities.  Chronic diffuse atrophy.   Electronically Signed   By: Lucienne Capers M.D.   On: 09/16/2014 02:32   Dg Chest Port 1 View  09/17/2014   CLINICAL DATA:  Cough, congestion, history hypertension, diabetes, multiple myeloma, chronic renal insufficiency  EXAM: PORTABLE CHEST - 1 VIEW  COMPARISON:  Portable exam 0813 hr compared to 09/16/2014  FINDINGS: RIGHT jugular power port with tip projecting over SVC near cavoatrial junction.  Enlargement of cardiac silhouette.  Mediastinal contours and pulmonary vascularity normal.  Slightly prominent RIGHT first costochondral junction unchanged.  No definite acute infiltrate, pleural effusion, or pneumothorax.  Multilevel endplate spur formation thoracic spine.  IMPRESSION: Enlargement of cardiac silhouette.  No acute abnormalities.   Electronically Signed  By: Lavonia Dana M.D.   On: 09/17/2014 10:07   Dg Chest Port 1 View  09/16/2014   CLINICAL DATA:  Weakness.  Shortness of breath.  Cough.  EXAM: PORTABLE CHEST - 1 VIEW  COMPARISON:  12/21/2013  FINDINGS: Shallow inspiration. Mild cardiac enlargement. Mild pulmonary vascular  congestion. Atelectasis in the lung bases. No blunting of costophrenic angles. No focal airspace disease or consolidation. Power port type central venous catheter has been placed in the interval with tip overlying the cavoatrial junction. No pneumothorax. Degenerative changes in the spine.  IMPRESSION: Shallow inspiration with atelectasis in the bases. Mild cardiac enlargement and vascular congestion without edema or consolidation.   Electronically Signed   By: Lucienne Capers M.D.   On: 09/16/2014 01:26         Subjective: Patient denies fevers, chills, chest pain, shortness of breath, nausea, vomiting, abdominal pain. No respiratory distress or diarrhea noted.  Objective: Filed Vitals:   09/19/14 1441 09/19/14 2102 09/20/14 0532 09/20/14 1406  BP: 103/54 129/49 111/54 140/48  Pulse: 80 80 73 82  Temp: 99.3 F (37.4 C) 98.9 F (37.2 C) 99.8 F (37.7 C) 99.7 F (37.6 C)  TempSrc: Oral Oral Oral Oral  Resp: 18 18 18 18   Height:      Weight:   104.7 kg (230 lb 13.2 oz)   SpO2: 97% 98% 97% 95%    Intake/Output Summary (Last 24 hours) at 09/20/14 1721 Last data filed at 09/20/14 1413  Gross per 24 hour  Intake      0 ml  Output   1625 ml  Net  -1625 ml   Weight change:  Exam:   General:  Pt is alert,  not in acute distress  HEENT: No icterus, No thrush,  [place]/AT  Cardiovascular: RRR, S1/S2, no rubs, no gallops  Respiratory: Bibasilar crackles. No wheezing. Good air movement  Abdomen: Soft/+BS, non tender, non distended, no guarding  Extremities: 2+LE edema, No lymphangitis, No petechiae, No rashes, no synovitis  Data Reviewed: Basic Metabolic Panel:  Recent Labs Lab 09/16/14 0058 09/16/14 0945 09/17/14 0315 09/18/14 0409 09/19/14 0502  NA 139 141 142 143 145  K 3.6* 3.7 3.7 3.4* 3.3*  CL 106 107 106 106 108  CO2 22 23 25 26 26   GLUCOSE 252* 163* 132* 74 100*  BUN 17 15 17 20 21   CREATININE 1.26 1.30 1.68* 1.97* 1.74*  CALCIUM 7.9* 8.1* 7.9* 7.7* 8.0*    Liver Function Tests:  Recent Labs Lab 09/15/14 0910 09/16/14 0058 09/16/14 0945 09/17/14 0315  AST 13 10 10 8   ALT <6 14 5  <5  ALKPHOS 88 80 79 72  BILITOT 1.12 1.0 0.8 0.6  PROT 4.9* 4.7* 4.9* 4.8*  ALBUMIN 2.8* 2.7* 2.8* 2.7*   No results found for this basename: LIPASE, AMYLASE,  in the last 168 hours No results found for this basename: AMMONIA,  in the last 168 hours CBC:  Recent Labs Lab 09/15/14 2158 09/15/14 2211 09/16/14 0945 09/17/14 0315 09/18/14 0409 09/19/14 0502  WBC 2.2*  --  2.5* 2.0* 2.2* 2.3*  NEUTROABS 1.4*  --  1.5* 0.9* 0.8* 0.7*  HGB 8.8* 9.2* 9.0* 8.8* 8.9* 8.9*  HCT 24.9* 27.0* 26.1* 25.8* 26.2* 26.5*  MCV 96.5  --  96.3 96.6 97.4 98.1  PLT 27*  --  27* 23* 24* 29*   Cardiac Enzymes:  Recent Labs Lab 09/16/14 0058  CKTOTAL 23   BNP: No components found with this basename: POCBNP,  CBG:  Recent Labs Lab 09/19/14 1655 09/19/14 2052 09/19/14 2150 09/20/14 0803 09/20/14 1208  GLUCAP 82 78 91 80 100*    No results found for this or any previous visit (from the past 240 hour(s)).   Scheduled Meds: . carbidopa-levodopa  1 tablet Oral TID  . morphine  30 mg Oral Q12H  . sodium chloride  3 mL Intravenous Q12H   Continuous Infusions:    [name] Henshaw, DO  [facility] Pager [phone]  If 7PM-7AM, please contact night-coverage [email or web address] Password [email or web address] 09/20/2014, 5:21 PM   LOS: 5 days

## 2014-09-20 NOTE — Progress Notes (Signed)
PT Cancellation Note / Discharge  Patient Details Name: [name] [name] MRN: [id number] DOB: 03-16-1941   Cancelled Treatment:    Reason Eval/Treat Not Completed: Other (comment) Per palliative care note: "Family is electing comfort care and will be looking into residential placement as the patient has been falling at home."  Pt was +2 total assist on evaluation and hoyer lift used for OOB to recliner.  Will sign off for physical therapy as plans in place for [name] at d/c.    Kamron Vanwyhe,KATHrine E 09/20/2014, 2:14 PM Carmelia Bake, PT, DPT 09/20/2014 Pager: [phone]

## 2014-09-20 NOTE — Discharge Summary (Signed)
Physician Discharge Summary  [name] [name] JFH:[id number] DOB: 07/06/1941 DOA: 09/15/2014  PCP: Kandice Hams, MD  Admit date: 09/15/2014 Discharge date:09/21/14 PATIENT IS BEING DISCHARGED TO RESIDENTIAL HOSPICE CARE AT [facility]   Discharge Diagnoses:  Multiple myeloma  - followed by Dr. Alvy Bimler, s/p multiple rounds of chemotherapy as well as autologous stem cell support at [facility] in 2011, with initial responses to various cycles however later with worsening serum light chains.  - he is now enrolled in a clinical trial with Revlimid and Elotuzumab which is now on hold due to his weakness  - discussed with Dr. Alvy Bimler 9/28, apparently he is quite weak at home as well and this may not be far off of his baseline  - palliative consulted-->transition to focus of comfort care  -appreciate Dr. Alvy Bimler follow up and Dr. Lovena Le Generalized profound weakness  - progressively weak since starting his Elotuzumab, muscle pain and unable to walk and barely able to lift his arms up  - no evidence of active infection, CXR and UA unremarkable  CKD stage III  - baseline Cr 1.4 - 1.6, in the setting of MM  - with significant fluid overload today on LE, mild crackles on exam, one time Lasix iv on 9/27 with good UOP,  -No longer an active issue as the patient's focus of care has been transitioned to focus of comfort  -Patient's wife wanted IV fluids to continue until all family members have been able to come visit with patient--this was discussed with social work Pancytopenia  - in the setting of chemotherapy, no active bleeding  - monitor  - hold aspirin for now  Parkinson's disease  - patient with mild rigidity on exam, tells me that his disease is well controlled  - Continue Sinemet  Diabetes mellitus.  - This patient's focus of care has transitioned to that of comfort, discontinue CBGs, discontinue Lantus  History of DVT  - not currently on anticoagulation due to pancytopenia  - LE DVT Doppler  negative  -continue SCDs  GOC  -comfort care  -change to IV morphine if patient unable to swallow  -wife wants IVF to continue for now  Discharge Condition: STABLE  Disposition: residential hospice  Diet:comfort feeding Wt Readings from Last 3 Encounters:  09/21/14 97.9 kg (215 lb 13.3 oz)  09/08/14 100.699 kg (222 lb)  09/01/14 102.967 kg (227 lb)    History of present illness:  73 y.o. male is here because of profound weakness and hypotension. [name] [name] has a long-standing history of multiple myeloma and is taken care of by Dr.Gorsuch. Please refer to the note by Dr.Gorsuch from 09/08/2014 the details of his prior history. Most recently he has been on new treatment for myeloma with Elotuzumab along with Revlimid on a clinical trial that started 07/07/2014. On Friday, 09/15/14, he came to the onc clinic to receive the treatment but he was found to be hypotensive and he received IV fluids and was discharged home. He came back to the emergency room with severe weakness and tremor some anxiety. He was found to be pancytopenic with a white count of 2.2 and absolute neutrophil count of 1400. He also had hemoglobin of 8.2 but this morning it was up to 9.2 and severe thrombocytopenia with platelets of 27.     Consultants: MedOnc--Gorsuch Palliative Medicine--Dr. Lovena Le  Discharge Exam: Filed Vitals:   09/21/14 0626  BP: 139/52  Pulse: 80  Temp: 99.1 F (37.3 C)  Resp: 18   Filed Vitals:   09/20/14  0532 09/20/14 1406 09/20/14 2032 09/21/14 0626  BP: 111/54 140/48 126/56 139/52  Pulse: 73 82 85 80  Temp: 99.8 F (37.7 C) 99.7 F (37.6 C) 99.3 F (37.4 C) 99.1 F (37.3 C)  TempSrc: Oral Oral Oral Oral  Resp: 18 18 18 18   Height:      Weight: 104.7 kg (230 lb 13.2 oz)   97.9 kg (215 lb 13.3 oz)  SpO2: 97% 95% 98% 96%   General: Alert and awake, NAD, pleasant Cardiovascular: RRR, no rub, no gallop, no S3 Respiratory: bibasilar crackles without wheeze Abdomen:soft,  nontender, nondistended, positive bowel sounds Extremities: trace LE edema, No lymphangitis, no petechiae  Discharge Instructions      Discharge Instructions   Diet - low sodium heart healthy    Complete by:  As directed      Increase activity slowly    Complete by:  As directed             Medication List    STOP taking these medications       aspirin EC 81 MG tablet     atorvastatin 10 MG tablet  Commonly known as:  LIPITOR     dexamethasone 4 MG tablet  Commonly known as:  DECADRON     LANTUS SOLOSTAR [place]     lenalidomide 25 MG capsule  Commonly known as:  REVLIMID     valACYclovir 500 MG tablet  Commonly known as:  VALTREX      TAKE these medications       acetaminophen 500 MG tablet  Commonly known as:  TYLENOL  Take 500 mg by mouth every 6 (six) hours as needed. For pain.     carbidopa-levodopa 25-100 MG per tablet  Commonly known as:  SINEMET IR  Take 1 tablet by mouth 3 (three) times daily.     lidocaine-prilocaine cream  Commonly known as:  EMLA  Apply 1 application topically as needed. Apply to port at least 1 hour before procedure & cover.     morphine 15 MG tablet  Commonly known as:  MSIR  Take 1 tablet (15 mg total) by mouth every 4 (four) hours as needed for severe pain.     morphine 30 MG 12 hr tablet  Commonly known as:  MS CONTIN  Take 1 tablet (30 mg total) by mouth every 12 (twelve) hours.     morphine 4 MG/ML injection  Inject 1 mL (4 mg total) into the vein every 4 (four) hours as needed for severe pain.     ondansetron 8 MG disintegrating tablet  Commonly known as:  ZOFRAN-ODT  Take 8 mg by mouth every 8 (eight) hours as needed for nausea or vomiting.     polyethylene glycol packet  Commonly known as:  MIRALAX / GLYCOLAX  Take 17 g by mouth daily after breakfast.     pregabalin 100 MG capsule  Commonly known as:  LYRICA  Take 1 capsule (100 mg total) by mouth 2 (two) times daily.     sodium chloride 0.9 % infusion    Inject 50 mLs into the vein continuous.         The results of significant diagnostics from this hospitalization (including imaging, microbiology, ancillary and laboratory) are listed below for reference.    Significant Diagnostic Studies: Ct Head Wo Contrast  09/16/2014   CLINICAL DATA:  Increased generalized weakness for 2 weeks. There C and lightheaded. Slipped to the floor. History of multiple myeloma.  EXAM: CT HEAD  WITHOUT CONTRAST  TECHNIQUE: Contiguous axial images were obtained from the base of the skull through the vertex without intravenous contrast.  COMPARISON:  MRI brain 02/12/2012  FINDINGS: Mild diffuse cerebral atrophy. Mild ventricular dilatation consistent with central atrophy. No mass effect or midline shift. No abnormal extra-axial fluid collections. Gray-white matter junctions are distinct. Basal cisterns are not effaced. No evidence of acute intracranial hemorrhage. No depressed skull fractures. Visualized paranasal sinuses and mastoid air cells are not opacified.  IMPRESSION: No acute intracranial abnormalities.  Chronic diffuse atrophy.   Electronically Signed   By: Lucienne Capers M.D.   On: 09/16/2014 02:32   Dg Chest Port 1 View  09/17/2014   CLINICAL DATA:  Cough, congestion, history hypertension, diabetes, multiple myeloma, chronic renal insufficiency  EXAM: PORTABLE CHEST - 1 VIEW  COMPARISON:  Portable exam 0813 hr compared to 09/16/2014  FINDINGS: RIGHT jugular power port with tip projecting over SVC near cavoatrial junction.  Enlargement of cardiac silhouette.  Mediastinal contours and pulmonary vascularity normal.  Slightly prominent RIGHT first costochondral junction unchanged.  No definite acute infiltrate, pleural effusion, or pneumothorax.  Multilevel endplate spur formation thoracic spine.  IMPRESSION: Enlargement of cardiac silhouette.  No acute abnormalities.   Electronically Signed   By: Lavonia Dana M.D.   On: 09/17/2014 10:07   Dg Chest Port 1  View  09/16/2014   CLINICAL DATA:  Weakness.  Shortness of breath.  Cough.  EXAM: PORTABLE CHEST - 1 VIEW  COMPARISON:  12/21/2013  FINDINGS: Shallow inspiration. Mild cardiac enlargement. Mild pulmonary vascular congestion. Atelectasis in the lung bases. No blunting of costophrenic angles. No focal airspace disease or consolidation. Power port type central venous catheter has been placed in the interval with tip overlying the cavoatrial junction. No pneumothorax. Degenerative changes in the spine.  IMPRESSION: Shallow inspiration with atelectasis in the bases. Mild cardiac enlargement and vascular congestion without edema or consolidation.   Electronically Signed   By: Lucienne Capers M.D.   On: 09/16/2014 01:26     Microbiology: No results found for this or any previous visit (from the past 240 hour(s)).   Labs: Basic Metabolic Panel:  Recent Labs Lab 09/16/14 0058 09/16/14 0945 09/17/14 0315 09/18/14 0409 09/19/14 0502  NA 139 141 142 143 145  K 3.6* 3.7 3.7 3.4* 3.3*  CL 106 107 106 106 108  CO2 22 23 25 26 26   GLUCOSE 252* 163* 132* 74 100*  BUN 17 15 17 20 21   CREATININE 1.26 1.30 1.68* 1.97* 1.74*  CALCIUM 7.9* 8.1* 7.9* 7.7* 8.0*   Liver Function Tests:  Recent Labs Lab 09/15/14 0910 09/16/14 0058 09/16/14 0945 09/17/14 0315  AST 13 10 10 8   ALT <6 14 5  <5  ALKPHOS 88 80 79 72  BILITOT 1.12 1.0 0.8 0.6  PROT 4.9* 4.7* 4.9* 4.8*  ALBUMIN 2.8* 2.7* 2.8* 2.7*   No results found for this basename: LIPASE, AMYLASE,  in the last 168 hours No results found for this basename: AMMONIA,  in the last 168 hours CBC:  Recent Labs Lab 09/15/14 2158 09/15/14 2211 09/16/14 0945 09/17/14 0315 09/18/14 0409 09/19/14 0502  WBC 2.2*  --  2.5* 2.0* 2.2* 2.3*  NEUTROABS 1.4*  --  1.5* 0.9* 0.8* 0.7*  HGB 8.8* 9.2* 9.0* 8.8* 8.9* 8.9*  HCT 24.9* 27.0* 26.1* 25.8* 26.2* 26.5*  MCV 96.5  --  96.3 96.6 97.4 98.1  PLT 27*  --  27* 23* 24* 29*   Cardiac Enzymes:  Recent  Labs Lab 09/16/14 0058  CKTOTAL 23   BNP: No components found with this basename: POCBNP,  CBG:  Recent Labs Lab 09/20/14 0803 09/20/14 1208 09/20/14 2139 09/20/14 2349 09/21/14 0626  GLUCAP 80 100* 89 91 93    Time coordinating discharge:  Greater than 30 minutes  Signed:  Zariah Jost, DO [facility] Pager: [phone] 09/21/2014, 11:23 AM

## 2014-09-20 NOTE — Progress Notes (Signed)
[name] [name]   DOB:19-Oct-1941   FF#:[id number]   JTT#:[id number]  I have seen the patient, examined him and edited the notes as follows   Subjective: No new issues overnight.  Patient is asleep, did not disturb.  Appears very weak, but in no acute distress. Transitioning to comfort care.   Scheduled Meds: . carbidopa-levodopa  1 tablet Oral TID  . insulin aspart  0-15 Units Subcutaneous TID WC  . insulin aspart  0-5 Units Subcutaneous QHS  . insulin glargine  16 Units Subcutaneous QHS  . morphine  30 mg Oral Q12H  . pregabalin  100 mg Oral BID  . sodium chloride  3 mL Intravenous Q12H  . valACYclovir  500 mg Oral Daily   Continuous Infusions:  PRN Meds:acetaminophen, acetaminophen, morphine   Objective:  Filed Vitals:   09/20/14 0532  BP: 111/54  Pulse: 73  Temp: 99.8 F (37.7 C)  Resp: 18      Intake/Output Summary (Last 24 hours) at 09/20/14 0710 Last data filed at 09/20/14 0617  Gross per 24 hour  Intake     60 ml  Output   1575 ml  Net  -1515 ml    ECOG PERFORMANCE STATUS: 4  GENERAL:asleep, no distress and comfortable LUNGS: clear to auscultation and percussion with normal breathing effort HEART: regular rate & rhythm and no murmurs and 1+ lower extremity edema ABDOMEN:abdomen soft, non-tender and normal bowel sounds NEURO:patient asleep    Labs:  no new labs  No results found for this or any previous visit (from the past 240 hour(s)).  Assessment/Plan: 73 y.o.  Refractory multiple myeloma  He is currently off treatment. Performance status has declined from ECOG of 2 to 4. He is not a candidate for further treatment.  Profound weakness Causes are multifactorial, unlikely to improve due to untreated cancer, poor baseline PS, poorly controlled DM, severe painful peripheral neuropathy and Parkinson's disease Physical Therapy signed off, as patient unable to tolerate inpatient rehab.  Pancytopenia No further lab work is indicated as patient is  transferred to comfort care only.  Renal Failure With noted fluid overload on admission, responding to Lasix IV.  Appreciate Primary team follow up. Patient has declined hemodialysis if his kidney function continues to deteriorate.  History of DVT Lower Extremity Dopplers on admission negative Continue DVT prophylaxis with mechanical device.  Code status and discharge planning Code status is now DNR/DNI  [facility] when bed available.   Will sign off. Call if questions arise.   Rondel Jumbo, PA-C 09/20/2014  7:10 AM [name] Seto, MD 09/20/2014

## 2014-09-20 NOTE — Progress Notes (Signed)
SLP Cancellation Note  Patient Details Name: [name] [name] MRN: [id number] DOB: 03-14-1941   Cancelled treatment:       Reason Eval/Treat Not Completed:  (SLP to sign off as pt now comfort care)   Luanna Salk, [place] [facility] SLP [phone]

## 2014-09-21 ENCOUNTER — Ambulatory Visit: Payer: Medicare Other

## 2014-09-21 ENCOUNTER — Telehealth: Payer: Self-pay | Admitting: *Deleted

## 2014-09-21 DIAGNOSIS — D61818 Other pancytopenia: Secondary | ICD-10-CM

## 2014-09-21 DIAGNOSIS — G620 Drug-induced polyneuropathy: Secondary | ICD-10-CM

## 2014-09-21 DIAGNOSIS — R531 Weakness: Secondary | ICD-10-CM

## 2014-09-21 DIAGNOSIS — C9002 Multiple myeloma in relapse: Secondary | ICD-10-CM

## 2014-09-21 LAB — GLUCOSE, CAPILLARY
GLUCOSE-CAPILLARY: 91 mg/dL (ref 70–99)
Glucose-Capillary: 93 mg/dL (ref 70–99)

## 2014-09-21 MED ORDER — SODIUM CHLORIDE 0.9 % IV SOLN: INTRAVENOUS | Status: DC

## 2014-09-21 MED ORDER — SODIUM CHLORIDE 0.9 % IV SOLN: 50.0000 mL | INTRAVENOUS | Status: AC

## 2014-09-21 MED ORDER — SODIUM CHLORIDE 0.9 % IJ SOLN
10.0000 mL | INTRAMUSCULAR | Status: DC | PRN
  Administered 2014-09-21: 10 mL

## 2014-09-21 MED ORDER — HEPARIN SOD (PORK) LOCK FLUSH 100 UNIT/ML IV SOLN
500.0000 [IU] | INTRAVENOUS | Status: AC | PRN
  Administered 2014-09-21: 500 [IU]

## 2014-09-21 MED ORDER — MORPHINE SULFATE 4 MG/ML IJ SOLN: 4.0000 mg | INTRAMUSCULAR | Status: AC | PRN

## 2014-09-21 MED ORDER — MORPHINE SULFATE ER 30 MG PO TBCR: 30.0000 mg | EXTENDED_RELEASE_TABLET | Freq: Two times a day (BID) | ORAL | Status: AC

## 2014-09-21 NOTE — Progress Notes (Signed)
Patient is set to discharge to [facility] today. Patient & family at bedside aware. Discharge packet in [place] aware. [facility] called for transport.   Raynaldo Opitz, [facility] Clinical Social Worker cell #: [phone]

## 2014-09-21 NOTE — Telephone Encounter (Signed)
Call from Erling Conte w/ [place],  States pt being transferred to [facility].  Informed Dr. Alvy Bimler and she will be the Attending Physician for Hospice and requests Hospice MD to provide symptom management orders. Notified Erling Conte.

## 2014-09-22 ENCOUNTER — Other Ambulatory Visit: Payer: Medicare Other

## 2014-09-22 ENCOUNTER — Ambulatory Visit: Payer: Medicare Other

## 2014-09-23 ENCOUNTER — Ambulatory Visit: Payer: Medicare Other

## 2014-09-25 ENCOUNTER — Encounter: Payer: Self-pay | Admitting: *Deleted

## 2014-09-27 ENCOUNTER — Other Ambulatory Visit: Payer: Self-pay | Admitting: Hematology and Oncology

## 2014-09-29 ENCOUNTER — Ambulatory Visit: Payer: Medicare Other | Admitting: Hematology and Oncology

## 2014-09-29 ENCOUNTER — Ambulatory Visit: Payer: Medicare Other

## 2014-09-29 ENCOUNTER — Other Ambulatory Visit: Payer: Medicare Other

## 2014-10-13 ENCOUNTER — Ambulatory Visit: Payer: Medicare Other

## 2014-10-13 ENCOUNTER — Other Ambulatory Visit: Payer: Medicare Other

## 2014-10-19 ENCOUNTER — Telehealth: Payer: Self-pay | Admitting: *Deleted

## 2014-10-19 NOTE — Telephone Encounter (Signed)
Son left VM asking for letter for his job.  Pt passed away earlier this month and son says he came to help his mother for 3 weeks.  Called son back for more detail about what he needs.  Left him a VM to return nurse's call again.

## 2014-10-22 NOTE — Progress Notes (Signed)
Received note from [facility] pt died today at 4:52 am.

## 2014-10-22 DEATH — deceased

## 2014-10-23 ENCOUNTER — Telehealth: Payer: Self-pay | Admitting: *Deleted

## 2014-10-23 NOTE — Telephone Encounter (Signed)
Call from pt's son, [name] [name].  He requests letter from Dr. Alvy Bimler stating pt had cancer and died.  [name] [name] says he came to help his mother for about 3 weeks after his father's death.  Letter states pt died from Multiple Myeloma on Sep 28, 2014, signed by Dr. Alvy Bimler.  Letter mailed to son, [name] at [address], [place], [place], [place], [address].

## 2014-11-15 ENCOUNTER — Ambulatory Visit: Payer: Medicare Other | Admitting: Nurse Practitioner
